# Patient Record
Sex: Female | Born: 1965
Health system: Southern US, Academic
[De-identification: ages and names within clinical notes are randomized; demographics above are authoritative.]

## PROBLEM LIST (undated history)

## (undated) ENCOUNTER — Ambulatory Visit: Payer: MEDICARE

## (undated) ENCOUNTER — Encounter

## (undated) ENCOUNTER — Telehealth

## (undated) ENCOUNTER — Encounter: Attending: Hematology & Oncology | Primary: Hematology & Oncology

## (undated) ENCOUNTER — Ambulatory Visit

## (undated) ENCOUNTER — Encounter
Attending: Student in an Organized Health Care Education/Training Program | Primary: Student in an Organized Health Care Education/Training Program

## (undated) ENCOUNTER — Telehealth
Attending: Student in an Organized Health Care Education/Training Program | Primary: Student in an Organized Health Care Education/Training Program

## (undated) ENCOUNTER — Ambulatory Visit: Payer: MEDICARE | Attending: Hematology & Oncology | Primary: Hematology & Oncology

## (undated) ENCOUNTER — Encounter: Attending: Pharmacist | Primary: Pharmacist

## (undated) ENCOUNTER — Telehealth: Payer: MEDICARE

## (undated) ENCOUNTER — Telehealth: Attending: Hematology & Oncology | Primary: Hematology & Oncology

## (undated) ENCOUNTER — Other Ambulatory Visit

## (undated) ENCOUNTER — Telehealth: Attending: Adult Health | Primary: Adult Health

## (undated) ENCOUNTER — Ambulatory Visit: Payer: Medicare (Managed Care)

## (undated) ENCOUNTER — Ambulatory Visit: Payer: MEDICARE | Attending: Adult Health | Primary: Adult Health

## (undated) ENCOUNTER — Telehealth: Attending: Pharmacist | Primary: Pharmacist

## (undated) ENCOUNTER — Ambulatory Visit
Payer: MEDICARE | Attending: Student in an Organized Health Care Education/Training Program | Primary: Student in an Organized Health Care Education/Training Program

## (undated) DIAGNOSIS — M810 Age-related osteoporosis without current pathological fracture: Secondary | ICD-10-CM

## (undated) DIAGNOSIS — K219 Gastro-esophageal reflux disease without esophagitis: Secondary | ICD-10-CM

## (undated) DIAGNOSIS — F419 Anxiety disorder, unspecified: Secondary | ICD-10-CM

## (undated) DIAGNOSIS — K5900 Constipation, unspecified: Secondary | ICD-10-CM

## (undated) DIAGNOSIS — M1990 Unspecified osteoarthritis, unspecified site: Secondary | ICD-10-CM

## (undated) DIAGNOSIS — F259 Schizoaffective disorder, unspecified: Secondary | ICD-10-CM

## (undated) DIAGNOSIS — J45909 Unspecified asthma, uncomplicated: Secondary | ICD-10-CM

## (undated) DIAGNOSIS — E669 Obesity, unspecified: Secondary | ICD-10-CM

## (undated) DIAGNOSIS — I10 Essential (primary) hypertension: Secondary | ICD-10-CM

## (undated) DIAGNOSIS — M109 Gout, unspecified: Secondary | ICD-10-CM

## (undated) DIAGNOSIS — R32 Unspecified urinary incontinence: Secondary | ICD-10-CM

## (undated) HISTORY — PX: ABDOMINAL HYSTERECTOMY: SHX81

## (undated) MED ORDER — METOPROLOL SUCCINATE ER 25 MG TABLET,EXTENDED RELEASE 24 HR: Freq: Every day | ORAL | 0 days

## (undated) MED ORDER — BIOTENE MOISTURIZING MOUTH MUCOSAL SPRAY: 0 days | PRN

## (undated) MED ORDER — ONDANSETRON HCL 4 MG TABLET: Freq: Three times a day (TID) | ORAL | 0.00000 days | Status: SS | PRN

## (undated) MED ORDER — BISACODYL 5 MG TABLET,DELAYED RELEASE: Freq: Every day | ORAL | 0 days | Status: SS | PRN

## (undated) MED ORDER — PRAVASTATIN 20 MG TABLET
Freq: Every day | ORAL | 0 days | Status: SS
Start: ? — End: 2020-07-24

## (undated) MED ORDER — METOPROLOL TARTRATE 25 MG TABLET: Freq: Two times a day (BID) | ORAL | 0.00000 days | Status: SS

## (undated) MED ORDER — METFORMIN ER 1,000 MG 24 HR TABLET,EXTENDED RELEASE: Freq: Every day | ORAL | 0 days

## (undated) MED ORDER — SERTRALINE 50 MG TABLET: Freq: Every day | ORAL | 0 days

## (undated) MED ORDER — QUETIAPINE 50 MG TABLET: Freq: Every evening | ORAL | 0 days

## (undated) MED ORDER — LIDOCAINE 5 % TOPICAL PATCH: TRANSDERMAL | 0 days

## (undated) MED ORDER — DIPHENOXYLATE-ATROPINE 2.5 MG-0.025 MG TABLET: Freq: Four times a day (QID) | ORAL | 0 days | PRN

## (undated) MED ORDER — VALPROIC ACID 250 MG CAPSULE: Freq: Three times a day (TID) | ORAL | 0 days

---
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
## 2000-08-19 ENCOUNTER — Inpatient Hospital Stay (HOSPITAL_COMMUNITY): Admission: EM | Admit: 2000-08-19 | Discharge: 2000-08-27 | Payer: Self-pay | Admitting: *Deleted

## 2000-10-02 ENCOUNTER — Inpatient Hospital Stay (HOSPITAL_COMMUNITY): Admission: EM | Admit: 2000-10-02 | Discharge: 2000-10-09 | Payer: Self-pay | Admitting: Psychiatry

## 2002-03-16 ENCOUNTER — Encounter: Payer: Self-pay | Admitting: Emergency Medicine

## 2002-03-16 ENCOUNTER — Emergency Department (HOSPITAL_COMMUNITY): Admission: EM | Admit: 2002-03-16 | Discharge: 2002-03-16 | Payer: Self-pay | Admitting: Emergency Medicine

## 2012-05-26 ENCOUNTER — Emergency Department: Payer: Self-pay | Admitting: Emergency Medicine

## 2012-05-26 LAB — CBC
HGB: 11.5 g/dL — ABNORMAL LOW (ref 12.0–16.0)
MCH: 28.7 pg (ref 26.0–34.0)
Platelet: 392 10*3/uL (ref 150–440)
RBC: 4.01 10*6/uL (ref 3.80–5.20)
WBC: 14 10*3/uL — ABNORMAL HIGH (ref 3.6–11.0)

## 2012-05-26 LAB — URINALYSIS, COMPLETE
Glucose,UR: NEGATIVE mg/dL (ref 0–75)
Ketone: NEGATIVE
Leukocyte Esterase: NEGATIVE
Nitrite: NEGATIVE
Ph: 5 (ref 4.5–8.0)
Protein: NEGATIVE
RBC,UR: 1 /HPF (ref 0–5)
Squamous Epithelial: 14
WBC UR: 5 /HPF (ref 0–5)

## 2012-05-26 LAB — COMPREHENSIVE METABOLIC PANEL
Albumin: 3.8 g/dL (ref 3.4–5.0)
Anion Gap: 4 — ABNORMAL LOW (ref 7–16)
Bilirubin,Total: 0.3 mg/dL (ref 0.2–1.0)
Chloride: 104 mmol/L (ref 98–107)
Co2: 29 mmol/L (ref 21–32)
Creatinine: 0.76 mg/dL (ref 0.60–1.30)
EGFR (African American): >60
EGFR (Non-African Amer.): >60
Glucose: 110 mg/dL — ABNORMAL HIGH (ref 65–99)
SGPT (ALT): 20 U/L (ref 12–78)
Sodium: 137 mmol/L (ref 136–145)
Total Protein: 8.2 g/dL (ref 6.4–8.2)

## 2012-05-26 LAB — DRUG SCREEN, URINE
Amphetamines, Ur Screen: NEGATIVE (ref ?–1000)
Barbiturates, Ur Screen: NEGATIVE (ref ?–200)
Benzodiazepine, Ur Scrn: NEGATIVE (ref ?–200)
Cannabinoid 50 Ng, Ur ~~LOC~~: NEGATIVE (ref ?–50)
Cocaine Metabolite,Ur ~~LOC~~: NEGATIVE (ref ?–300)
MDMA (Ecstasy)Ur Screen: NEGATIVE (ref ?–500)
Opiate, Ur Screen: NEGATIVE (ref ?–300)
Phencyclidine (PCP) Ur S: NEGATIVE (ref ?–25)

## 2012-05-26 LAB — ETHANOL: Ethanol %: <0.003 % (ref 0.000–0.080)

## 2012-05-26 LAB — TSH: Thyroid Stimulating Horm: 1.95 u[IU]/mL

## 2012-05-27 LAB — DIFFERENTIAL
Basophil #: 0.1 10*3/uL (ref 0.0–0.1)
Basophil %: 0.9 %
Eosinophil %: 1.7 %
Lymphocyte #: 4.6 10*3/uL — ABNORMAL HIGH (ref 1.0–3.6)
Lymphocyte %: 32.9 %
Monocyte #: 0.9 x10 3/mm (ref 0.2–0.9)
Monocyte %: 6.4 %
Neutrophil #: 8.1 10*3/uL — ABNORMAL HIGH (ref 1.4–6.5)
Neutrophil %: 58.1 %

## 2012-06-02 LAB — DIFFERENTIAL
Basophil #: 0.1 10*3/uL (ref 0.0–0.1)
Basophil %: 1 %
Eosinophil #: 0.3 10*3/uL (ref 0.0–0.7)
Lymphocyte #: 3.9 10*3/uL — ABNORMAL HIGH (ref 1.0–3.6)
Lymphocyte %: 37.6 %
Monocyte #: 0.9 x10 3/mm (ref 0.2–0.9)
Monocyte %: 8.9 %
Neutrophil %: 49.9 %

## 2012-06-02 LAB — WBC: WBC: 10.4 10*3/uL (ref 3.6–11.0)

## 2012-06-09 ENCOUNTER — Emergency Department: Payer: Self-pay | Admitting: Emergency Medicine

## 2012-06-09 LAB — ACETAMINOPHEN LEVEL: Acetaminophen: <2 ug/mL

## 2012-06-09 LAB — COMPREHENSIVE METABOLIC PANEL
Albumin: 3.8 g/dL (ref 3.4–5.0)
Alkaline Phosphatase: 85 U/L (ref 50–136)
Anion Gap: 7 (ref 7–16)
BUN: 12 mg/dL (ref 7–18)
Bilirubin,Total: 0.2 mg/dL (ref 0.2–1.0)
Calcium, Total: 9.3 mg/dL (ref 8.5–10.1)
Co2: 23 mmol/L (ref 21–32)
EGFR (African American): >60
EGFR (Non-African Amer.): >60
Glucose: 155 mg/dL — ABNORMAL HIGH (ref 65–99)
Osmolality: 279 (ref 275–301)
Potassium: 3.2 mmol/L — ABNORMAL LOW (ref 3.5–5.1)
Sodium: 138 mmol/L (ref 136–145)

## 2012-06-09 LAB — CBC
MCH: 28.4 pg (ref 26.0–34.0)
MCHC: 33.3 g/dL (ref 32.0–36.0)
MCV: 85 fL (ref 80–100)
Platelet: 361 10*3/uL (ref 150–440)
RDW: 14.7 % — ABNORMAL HIGH (ref 11.5–14.5)

## 2012-06-09 LAB — URINALYSIS, COMPLETE
Blood: NEGATIVE
Glucose,UR: NEGATIVE mg/dL (ref 0–75)
Nitrite: NEGATIVE
Ph: 5 (ref 4.5–8.0)
Protein: 30
Specific Gravity: 1.031 (ref 1.003–1.030)
Squamous Epithelial: 16
WBC UR: 3 /HPF (ref 0–5)

## 2012-06-09 LAB — DRUG SCREEN, URINE
Benzodiazepine, Ur Scrn: NEGATIVE (ref ?–200)
Opiate, Ur Screen: NEGATIVE (ref ?–300)
Tricyclic, Ur Screen: NEGATIVE (ref ?–1000)

## 2012-06-09 LAB — SALICYLATE LEVEL: Salicylates, Serum: <1.7 mg/dL

## 2012-06-09 LAB — ETHANOL: Ethanol: <3 mg/dL

## 2012-06-09 LAB — TSH: Thyroid Stimulating Horm: 1.85 u[IU]/mL

## 2012-06-10 LAB — CBC
HCT: 34.7 % — ABNORMAL LOW (ref 35.0–47.0)
HGB: 11.2 g/dL — ABNORMAL LOW (ref 12.0–16.0)
MCH: 27.7 pg (ref 26.0–34.0)
MCV: 86 fL (ref 80–100)
Platelet: 356 10*3/uL (ref 150–440)
RBC: 4.04 10*6/uL (ref 3.80–5.20)
RDW: 14.9 % — ABNORMAL HIGH (ref 11.5–14.5)
WBC: 13.1 10*3/uL — ABNORMAL HIGH (ref 3.6–11.0)

## 2012-06-10 LAB — DIFFERENTIAL
Eosinophil #: 0.1 10*3/uL (ref 0.0–0.7)
Eosinophil %: 1.9 %
Monocyte #: 0.9 x10 3/mm (ref 0.2–0.9)
Neutrophil #: 7.6 10*3/uL — ABNORMAL HIGH (ref 1.4–6.5)
Neutrophil %: 58 %

## 2012-06-26 ENCOUNTER — Emergency Department: Payer: Self-pay | Admitting: Unknown Physician Specialty

## 2012-06-26 LAB — COMPREHENSIVE METABOLIC PANEL
Anion Gap: 5 — ABNORMAL LOW (ref 7–16)
BUN: 12 mg/dL (ref 7–18)
Bilirubin,Total: 0.2 mg/dL (ref 0.2–1.0)
Calcium, Total: 9.1 mg/dL (ref 8.5–10.1)
Co2: 27 mmol/L (ref 21–32)
Creatinine: 0.82 mg/dL (ref 0.60–1.30)
EGFR (African American): >60
EGFR (Non-African Amer.): >60
Glucose: 93 mg/dL (ref 65–99)
Potassium: 3.6 mmol/L (ref 3.5–5.1)
SGOT(AST): 14 U/L — ABNORMAL LOW (ref 15–37)
Sodium: 140 mmol/L (ref 136–145)
Total Protein: 8.2 g/dL (ref 6.4–8.2)

## 2012-06-26 LAB — CBC
HGB: 11.2 g/dL — ABNORMAL LOW (ref 12.0–16.0)
MCH: 28.7 pg (ref 26.0–34.0)
MCHC: 33.3 g/dL (ref 32.0–36.0)
RBC: 3.92 10*6/uL (ref 3.80–5.20)
RDW: 15 % — ABNORMAL HIGH (ref 11.5–14.5)

## 2012-06-26 LAB — TSH: Thyroid Stimulating Horm: 1.98 u[IU]/mL

## 2012-06-26 LAB — ETHANOL: Ethanol %: <0.003 % (ref 0.000–0.080)

## 2012-06-26 LAB — ACETAMINOPHEN LEVEL: Acetaminophen: <2 ug/mL

## 2012-06-27 LAB — URINALYSIS, COMPLETE
Bilirubin,UR: NEGATIVE
Glucose,UR: NEGATIVE mg/dL (ref 0–75)
Ketone: NEGATIVE
Leukocyte Esterase: NEGATIVE
Nitrite: NEGATIVE
Ph: 6 (ref 4.5–8.0)
Protein: NEGATIVE
Specific Gravity: 1.012 (ref 1.003–1.030)
Squamous Epithelial: 10
WBC UR: 2 /HPF (ref 0–5)

## 2012-06-27 LAB — DIFFERENTIAL
Basophil #: 0.1 10*3/uL (ref 0.0–0.1)
Basophil %: 0.9 %
Eosinophil #: 0.2 10*3/uL (ref 0.0–0.7)
Eosinophil %: 1.5 %
Lymphocyte #: 5.3 10*3/uL — ABNORMAL HIGH (ref 1.0–3.6)
Lymphocyte %: 38.9 %
Monocyte #: 0.9 x10 3/mm (ref 0.2–0.9)
Monocyte %: 6.3 %
Neutrophil #: 7.1 10*3/uL — ABNORMAL HIGH (ref 1.4–6.5)
Neutrophil %: 52.4 %

## 2012-06-27 LAB — DRUG SCREEN, URINE
Amphetamines, Ur Screen: NEGATIVE (ref ?–1000)
Barbiturates, Ur Screen: NEGATIVE (ref ?–200)
Benzodiazepine, Ur Scrn: NEGATIVE (ref ?–200)
Cocaine Metabolite,Ur ~~LOC~~: NEGATIVE (ref ?–300)
MDMA (Ecstasy)Ur Screen: NEGATIVE (ref ?–500)

## 2013-01-18 ENCOUNTER — Emergency Department: Payer: Self-pay | Admitting: Emergency Medicine

## 2013-01-18 LAB — CBC
HCT: 34.4 % — ABNORMAL LOW (ref 35.0–47.0)
HGB: 11.5 g/dL — AB (ref 12.0–16.0)
MCH: 29.5 pg (ref 26.0–34.0)
MCHC: 33.5 g/dL (ref 32.0–36.0)
MCV: 88 fL (ref 80–100)
Platelet: 349 10*3/uL (ref 150–440)
RBC: 3.92 10*6/uL (ref 3.80–5.20)
RDW: 13.9 % (ref 11.5–14.5)
WBC: 14.5 10*3/uL — AB (ref 3.6–11.0)

## 2013-01-18 LAB — BASIC METABOLIC PANEL
Anion Gap: 5 — ABNORMAL LOW (ref 7–16)
BUN: 11 mg/dL (ref 7–18)
Calcium, Total: 9.3 mg/dL (ref 8.5–10.1)
Chloride: 106 mmol/L (ref 98–107)
Co2: 28 mmol/L (ref 21–32)
Creatinine: 0.72 mg/dL (ref 0.60–1.30)
GLUCOSE: 105 mg/dL — AB (ref 65–99)
Osmolality: 277 (ref 275–301)
POTASSIUM: 3.8 mmol/L (ref 3.5–5.1)
Sodium: 139 mmol/L (ref 136–145)

## 2013-01-19 LAB — URINALYSIS, COMPLETE
BILIRUBIN, UR: NEGATIVE
Blood: NEGATIVE
Glucose,UR: NEGATIVE mg/dL (ref 0–75)
KETONE: NEGATIVE
Leukocyte Esterase: NEGATIVE
Nitrite: NEGATIVE
PH: 6 (ref 4.5–8.0)
Protein: NEGATIVE
Specific Gravity: 1.049 (ref 1.003–1.030)
Squamous Epithelial: 3
WBC UR: <1 /HPF (ref 0–5)

## 2013-01-26 ENCOUNTER — Emergency Department: Payer: Self-pay | Admitting: Emergency Medicine

## 2013-01-26 LAB — DRUG SCREEN, URINE
AMPHETAMINES, UR SCREEN: NEGATIVE (ref ?–1000)
BENZODIAZEPINE, UR SCRN: NEGATIVE (ref ?–200)
Barbiturates, Ur Screen: NEGATIVE (ref ?–200)
Cannabinoid 50 Ng, Ur ~~LOC~~: NEGATIVE (ref ?–50)
Cocaine Metabolite,Ur ~~LOC~~: NEGATIVE (ref ?–300)
MDMA (ECSTASY) UR SCREEN: NEGATIVE (ref ?–500)
METHADONE, UR SCREEN: NEGATIVE (ref ?–300)
Opiate, Ur Screen: NEGATIVE (ref ?–300)
Phencyclidine (PCP) Ur S: NEGATIVE (ref ?–25)
Tricyclic, Ur Screen: NEGATIVE (ref ?–1000)

## 2013-01-26 LAB — COMPREHENSIVE METABOLIC PANEL
AST: 20 U/L (ref 15–37)
Albumin: 4.1 g/dL (ref 3.4–5.0)
Alkaline Phosphatase: 86 U/L
Anion Gap: 5 — ABNORMAL LOW (ref 7–16)
BUN: 9 mg/dL (ref 7–18)
Bilirubin,Total: 0.2 mg/dL (ref 0.2–1.0)
CO2: 25 mmol/L (ref 21–32)
Calcium, Total: 9.2 mg/dL (ref 8.5–10.1)
Chloride: 105 mmol/L (ref 98–107)
Creatinine: 0.6 mg/dL (ref 0.60–1.30)
EGFR (African American): >60
EGFR (Non-African Amer.): >60
Glucose: 126 mg/dL — ABNORMAL HIGH (ref 65–99)
OSMOLALITY: 270 (ref 275–301)
Potassium: 3.4 mmol/L — ABNORMAL LOW (ref 3.5–5.1)
SGPT (ALT): 18 U/L (ref 12–78)
SODIUM: 135 mmol/L — AB (ref 136–145)
TOTAL PROTEIN: 8.4 g/dL — AB (ref 6.4–8.2)

## 2013-01-26 LAB — URINALYSIS, COMPLETE
BLOOD: NEGATIVE
Bilirubin,UR: NEGATIVE
Glucose,UR: NEGATIVE mg/dL (ref 0–75)
Leukocyte Esterase: NEGATIVE
Nitrite: NEGATIVE
Ph: 5 (ref 4.5–8.0)
Protein: NEGATIVE
RBC,UR: 2 /HPF (ref 0–5)
Specific Gravity: 1.027 (ref 1.003–1.030)
Squamous Epithelial: 32
WBC UR: 2 /HPF (ref 0–5)

## 2013-01-26 LAB — DIFFERENTIAL
BASOS ABS: 0.1 10*3/uL (ref 0.0–0.1)
Basophil %: 0.7 %
Eosinophil #: 0.3 10*3/uL (ref 0.0–0.7)
Eosinophil %: 3 %
Lymphocyte #: 3.9 10*3/uL — ABNORMAL HIGH (ref 1.0–3.6)
Lymphocyte %: 37.3 %
MONO ABS: 0.6 x10 3/mm (ref 0.2–0.9)
Monocyte %: 5.8 %
NEUTROS ABS: 5.5 10*3/uL (ref 1.4–6.5)
NEUTROS PCT: 53.2 %

## 2013-01-26 LAB — CBC
HCT: 35.7 % (ref 35.0–47.0)
HGB: 12 g/dL (ref 12.0–16.0)
MCH: 30 pg (ref 26.0–34.0)
MCHC: 33.6 g/dL (ref 32.0–36.0)
MCV: 89 fL (ref 80–100)
Platelet: 350 10*3/uL (ref 150–440)
RBC: 3.99 10*6/uL (ref 3.80–5.20)
RDW: 13.8 % (ref 11.5–14.5)
WBC: 10.4 10*3/uL (ref 3.6–11.0)

## 2013-01-26 LAB — ETHANOL: Ethanol %: <0.003 % (ref 0.000–0.080)

## 2013-01-26 LAB — SALICYLATE LEVEL: Salicylates, Serum: <1.7 mg/dL

## 2013-01-26 LAB — ACETAMINOPHEN LEVEL: Acetaminophen: <2 ug/mL

## 2013-06-03 ENCOUNTER — Emergency Department: Payer: Self-pay | Admitting: Emergency Medicine

## 2013-06-04 ENCOUNTER — Emergency Department: Payer: Self-pay | Admitting: Emergency Medicine

## 2013-06-15 ENCOUNTER — Emergency Department: Payer: Self-pay | Admitting: Emergency Medicine

## 2013-06-15 LAB — COMPREHENSIVE METABOLIC PANEL
ALBUMIN: 3.9 g/dL (ref 3.4–5.0)
Alkaline Phosphatase: 81 U/L
Anion Gap: 6 — ABNORMAL LOW (ref 7–16)
BUN: 6 mg/dL — ABNORMAL LOW (ref 7–18)
Bilirubin,Total: 0.2 mg/dL (ref 0.2–1.0)
Calcium, Total: 9.4 mg/dL (ref 8.5–10.1)
Chloride: 103 mmol/L (ref 98–107)
Co2: 26 mmol/L (ref 21–32)
Creatinine: 0.64 mg/dL (ref 0.60–1.30)
EGFR (African American): >60
GLUCOSE: 94 mg/dL (ref 65–99)
Osmolality: 267 (ref 275–301)
POTASSIUM: 3.5 mmol/L (ref 3.5–5.1)
SGOT(AST): 13 U/L — ABNORMAL LOW (ref 15–37)
SGPT (ALT): 15 U/L (ref 12–78)
Sodium: 135 mmol/L — ABNORMAL LOW (ref 136–145)
Total Protein: 8.4 g/dL — ABNORMAL HIGH (ref 6.4–8.2)

## 2013-06-15 LAB — URINALYSIS, COMPLETE
BILIRUBIN, UR: NEGATIVE
Blood: NEGATIVE
GLUCOSE, UR: NEGATIVE mg/dL (ref 0–75)
KETONE: NEGATIVE
Leukocyte Esterase: NEGATIVE
Nitrite: NEGATIVE
Ph: 5 (ref 4.5–8.0)
Protein: NEGATIVE
Specific Gravity: 1.014 (ref 1.003–1.030)

## 2013-06-15 LAB — ETHANOL: Ethanol %: <0.003 % (ref 0.000–0.080)

## 2013-06-15 LAB — CBC
HCT: 36.1 % (ref 35.0–47.0)
HGB: 12 g/dL (ref 12.0–16.0)
MCH: 29.1 pg (ref 26.0–34.0)
MCHC: 33.2 g/dL (ref 32.0–36.0)
MCV: 88 fL (ref 80–100)
PLATELETS: 411 10*3/uL (ref 150–440)
RBC: 4.12 10*6/uL (ref 3.80–5.20)
RDW: 14 % (ref 11.5–14.5)
WBC: 15 10*3/uL — ABNORMAL HIGH (ref 3.6–11.0)

## 2013-06-15 LAB — DRUG SCREEN, URINE
AMPHETAMINES, UR SCREEN: NEGATIVE (ref ?–1000)
Barbiturates, Ur Screen: NEGATIVE (ref ?–200)
Benzodiazepine, Ur Scrn: NEGATIVE (ref ?–200)
Cannabinoid 50 Ng, Ur ~~LOC~~: NEGATIVE (ref ?–50)
Cocaine Metabolite,Ur ~~LOC~~: NEGATIVE (ref ?–300)
MDMA (ECSTASY) UR SCREEN: NEGATIVE (ref ?–500)
METHADONE, UR SCREEN: NEGATIVE (ref ?–300)
Opiate, Ur Screen: NEGATIVE (ref ?–300)
PHENCYCLIDINE (PCP) UR S: NEGATIVE (ref ?–25)
TRICYCLIC, UR SCREEN: NEGATIVE (ref ?–1000)

## 2013-06-15 LAB — SALICYLATE LEVEL: SALICYLATES, SERUM: 2.8 mg/dL

## 2013-06-15 LAB — ACETAMINOPHEN LEVEL: Acetaminophen: <2 ug/mL

## 2013-10-17 ENCOUNTER — Emergency Department: Payer: Self-pay | Admitting: Emergency Medicine

## 2014-04-23 ENCOUNTER — Emergency Department
Admit: 2014-04-23 | Disposition: A | Discharge status: Home / Self Care | Payer: Self-pay | Admitting: Emergency Medicine

## 2014-04-23 LAB — COMPREHENSIVE METABOLIC PANEL
ALK PHOS: 76 U/L
ALT: 14 U/L
AST: 20 U/L
Albumin: 4.2 g/dL
Anion Gap: 3 — ABNORMAL LOW (ref 7–16)
BUN: 7 mg/dL
Bilirubin,Total: 0.3 mg/dL
CALCIUM: 9 mg/dL
CO2: 25 mmol/L
CREATININE: 0.54 mg/dL
Chloride: 109 mmol/L
EGFR (African American): >60
EGFR (Non-African Amer.): >60
GLUCOSE: 117 mg/dL — AB
POTASSIUM: 3.2 mmol/L — AB
Sodium: 137 mmol/L
Total Protein: 8 g/dL

## 2014-04-23 LAB — CBC
HCT: 35.4 % (ref 35.0–47.0)
HGB: 11.8 g/dL — AB (ref 12.0–16.0)
MCH: 29 pg (ref 26.0–34.0)
MCHC: 33.3 g/dL (ref 32.0–36.0)
MCV: 87 fL (ref 80–100)
Platelet: 350 10*3/uL (ref 150–440)
RBC: 4.06 10*6/uL (ref 3.80–5.20)
RDW: 13.7 % (ref 11.5–14.5)
WBC: 10.2 10*3/uL (ref 3.6–11.0)

## 2014-04-23 LAB — URINALYSIS, COMPLETE
Bacteria: NONE SEEN
Bilirubin,UR: NEGATIVE
Blood: NEGATIVE
Glucose,UR: NEGATIVE mg/dL (ref 0–75)
Ketone: NEGATIVE
Leukocyte Esterase: NEGATIVE
NITRITE: NEGATIVE
Ph: 5 (ref 4.5–8.0)
Protein: NEGATIVE
Specific Gravity: 1.012 (ref 1.003–1.030)

## 2014-04-23 LAB — SALICYLATE LEVEL: Salicylates, Serum: <4 mg/dL

## 2014-04-23 LAB — DRUG SCREEN, URINE
Amphetamines, Ur Screen: NEGATIVE
BARBITURATES, UR SCREEN: NEGATIVE
Benzodiazepine, Ur Scrn: NEGATIVE
COCAINE METABOLITE, UR ~~LOC~~: NEGATIVE
Cannabinoid 50 Ng, Ur ~~LOC~~: NEGATIVE
MDMA (Ecstasy)Ur Screen: NEGATIVE
Methadone, Ur Screen: NEGATIVE
Opiate, Ur Screen: NEGATIVE
Phencyclidine (PCP) Ur S: NEGATIVE
TRICYCLIC, UR SCREEN: NEGATIVE

## 2014-04-23 LAB — ACETAMINOPHEN LEVEL

## 2014-04-23 LAB — ETHANOL

## 2014-04-24 NOTE — Consult Note (Signed)
Brief Consult Note: Diagnosis: Schizophrenia, Ch PT.   Patient was seen by consultant.   Recommend further assessment or treatment.   Comments: Pt seen for follow up. She remains agitated and stated that she wants to be dicharged as she does not belong here and she can go the homeless shelter as she does not have a place to go currently. She has a new guardian and she stated that her old guardian has already left her. She stated that she is taking her medications and she is not showing any behavioral problems. She remains agiated throughout the interview.  She is on Clozaril at this time.  Denied SI/HI or plans.   Plan; Will add Cogentin 1mg  po bid as her speech appeared somewhat slurred during the interview.  She will continue on Clozaril as prescribed.  Awaiting placement.  Electronic Signatures: Rhunette CroftFaheem, France Lusty S (MD)  (Signed 29-May-14 10:12)  Authored: Brief Consult Note   Last Updated: 29-May-14 10:12 by Rhunette CroftFaheem, Legaci Tarman S (MD)

## 2014-04-24 NOTE — Consult Note (Signed)
Brief Consult Note: Diagnosis: Schizophrenia, Ch PT.   Patient was seen by consultant.   Recommend further assessment or treatment.   Comments: Pt seen for follow up. She remains at her baseline and no new acute issues noted. She is currently on Clozaril for her Schizophrenia and has been responding to the medications.  Denied SI/HI or plans.   Plan; Will continue to montior.  No change in meds.  Electronic Signatures: Rhunette CroftFaheem, Leonides Minder S (MD)  (Signed 27-May-14 13:24)  Authored: Brief Consult Note   Last Updated: 27-May-14 13:24 by Rhunette CroftFaheem, Arlenis Blaydes S (MD)
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
## 2014-04-24 NOTE — Consult Note (Signed)
PATIENT NAME:  Amy Villarreal, Amy Villarreal MR#:  [id number] DATE OF BIRTH:  [birth year]  DATE OF CONSULTATION:  05/27/2012  REFERRING PHYSICIAN:   CONSULTING PHYSICIAN:  Quintavious Rinck K. Jahaad Penado, MD  AGE: 49   SEX:  Female   RACE:  African American   SUBJECTIVE:  This is followup of patient's discharge planning.  Intake reports that a guardian and group home are having conflicts of interest because the patient had behavior that was dangerous to others in the past.  Until the conflicts are resolved, the patient will not be discharged and IBC will be continued for now, to be re-evaluated again tomorrow for appropriate discharge planning.   ____________________________ Jannet MantisSurya K. Guss Bundehalla, MD skc:cc D: 05/27/2012 17:11:11 ET T: 05/27/2012 17:40:13 ET JOB#: [id number]  cc: Monika SalkSurya K. Guss Bundehalla, MD, <Dictator> Beau FannySURYA K Roselynne Lortz MD ELECTRONICALLY SIGNED 06/01/2012 15:31
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
## 2014-04-25 NOTE — Consult Note (Signed)
PATIENT NAME:  Amy Villarreal, Amy Villarreal MR#:  [id number] DATE OF BIRTH:  12-23-1965  DATE OF ADMISSION:  01/26/2013 DATE OF CONSULTATION:  01/27/2013  REFERRING PHYSICIAN:  Lowella FairyJohn Woodruff, MD  CONSULTING PHYSICIAN:  Gwenyth Dingee B. Amalya Salmons, MD   REASON FOR CONSULTATION: To evaluate aggressive patient.   IDENTIFYING DATA: [name] is a 49 year old female with history of schizophrenia.   CHIEF COMPLAINT: "I'm fine now."   HISTORY OF PRESENT ILLNESS: [name] is a resident at the group home. She has a conflict with her roommate. The roommate has been there the longest, and she feels that she can set the rules. The patient does not get along with her very well. She reportedly had an argument with her roommate and then broke a little table. She denies hurting people or massive property destruction. She has money, and she feels that she could pay for damages. She is cool and collected now and would very much like to return to her group home, and indeed, the group home is going to accept her back, but they are unwilling to move the patient to another room. The patient is okay with this. She has been compliant with medication and has been using all of her coping skills to make this placement work. The patient has been a resident of multiple group homes and has a history of violent and threatening behavior. She also oftentimes throws tantrums. She was transferred to this group home several months ago following [facility].  The patient, at the moment, denies any symptoms of depression, anxiety, psychosis. No symptoms suggestive of bipolar ammonia. No substance use. She assures me multiple times that she is cool and collected now. She will be able to deal with the situation and wants very much to return to the group home.   PAST PSYCHIATRIC HISTORY: She was diagnosed with schizophrenia. She has multiple psychiatric hospitalizations; her last one in June 2014 [facility] for  agitation that could not be controlled in our Emergency Room.   FAMILY PSYCHIATRIC HISTORY: Unknown.   PAST MEDICAL HISTORY: Osteoarthritis, obesity, vitamin D deficiency, diabetes, gout, GERD.   ALLERGIES: Unknown.   MEDICATIONS:  On admission, aspirin 81 mg daily, Mobic 7.5 mg daily, lisinopril 10 mg daily, Ativan 0.5 mg twice daily, Benadryl 50 mg every 4 hours as needed, allopurinol 100 mg twice daily, Clozaril 100 mg in the morning, 200 at night; Risperdal 3 mg at bedtime, senna daily, Prilosec 20 mg daily, vitamin D3 at 5000 units daily.   SOCIAL HISTORY: The patient is a group home resident. She is on disability and has health insurance. She has a history of multiple hospitalizations and multiple group home placements. It is unclear whether or not she is compliant with treatment.   REVIEW OF SYSTEMS: CONSTITUTIONAL: No fevers or chills. No weight changes.  EYES: No double or blurred vision.  ENT: No hearing loss.  RESPIRATORY: No shortness of breath or cough.  CARDIOVASCULAR: No chest pain or orthopnea.  GASTROINTESTINAL: No abdominal pain, nausea, vomiting or diarrhea.  GENITOURINARY: No incontinence or frequency.  ENDOCRINE: No heat or cold intolerance.  LYMPHATIC: No anemia or easy bruising.  INTEGUMENTARY: No acne or rash.  MUSCULOSKELETAL: No muscle or joint pain.  NEUROLOGIC: No tingling or weakness.  PSYCHIATRIC: See history of present illness for details.    PHYSICAL EXAMINATION: VITAL SIGNS: Blood pressure 139/74, pulse 88, respirations 18, temperature 98.1.  GENERAL: This is a morbidly obese female in no acute distress. The rest  of the physical examination is deferred to her primary attending.   LABORATORY DATA: Chemistries are within normal limits except for glucose of 126, sodium 135 and potassium 3.4. Blood alcohol level is 0. LFTs within normal limits. Urine tox screen negative for substances. CBC within normal limits. Urinalysis is not suggestive of urinary tract  infection. Serum acetaminophen and salicylates are low.   MENTAL STATUS EXAMINATION: The patient is alert and oriented to person, place, time and situation. She is pleasant, polite and cooperative. She is well groomed. Her speech is slow. She mumbles and is difficult to understand. Her timbre of voice is deep. Her mood is fine with flat affect. Thought process is logical. Thought content: She denies thoughts of hurting herself or others. There are no delusions or paranoia. There are no auditory or visual hallucinations. Her cognition is probably intact but difficult to assess formally. She seems of normal intelligence and average fund of knowledge. Her insight and judgment are poor.   DIAGNOSES: AXIS I: Schizophrenia.  AXIS II: Deferred.  AXIS III: Diabetes, osteoarthritis, obesity, vitamin D deficiency, gout, GERD.  AXIS IV: Mental illness, conflict at the group home, poor coping skills.  AXIS V: Global assessment of functioning is 45.   PLAN:  1.  The patient does not meet criteria for involuntary inpatient psychiatric commitment. Please discharge as appropriate.  2.  She is to continue all her medications without changes. No prescriptions necessary.  3.  She will follow up with [facility] as usual.  4.  Group home. Staff or ACT team will pick her up.    ____________________________ Ellin Goodie. Jennet Maduro, MD jbp:dmm D: 01/27/2013 19:25:14 ET T: 01/27/2013 20:20:15 ET JOB#: [id number]  cc: Arcola Freshour B. Jennet Maduro, MD, <Dictator> Shari Prows MD ELECTRONICALLY SIGNED 02/23/2013 6:42

## 2014-05-03 NOTE — Consult Note (Addendum)
PATIENT NAME:  Amy Villarreal, Amy Villarreal MR#:  [id number] DATE OF BIRTH:  Apr 24, 1965  DATE OF CONSULTATION:  04/23/2014  REFERRING PHYSICIAN:   CONSULTING PHYSICIAN:  Audery AmelJohn T. Clapacs, MD  IDENTIFYING INFORMATION AND REASON FOR CONSULT: This is a 49 year old woman with a history of schizophrenia, comes in the hospital with a chief complaint "I just needed to get away from there."   HISTORY OF PRESENT ILLNESS: The patient is not a very good historian and we do not have much collateral information. Commitment paperwork came with her. The patient states that she was having thoughts about being "suicidal." When I asked her to explain what she means by suicidal however it clearly has absolutely nothing to do with wanting to hurt herself or wanting to die. Instead sounds like she was getting angry and having thoughts about breaking something. Based on her past experience that when you break objects you end up having to pay for them she decided that instead she would call 911 and have herself brought into the Emergency Room. She will not explain to me why she was so angry. Every time I ask she just smiles and shakes her head and says that she just could not be around those people anymore. She says she has been taking all of her medicine as prescribed. Will not tell me about any new stresses or problems that she may have encountered. Denies having any hallucinations. Denies any substance abuse.   PAST PSYCHIATRIC HISTORY: The patient has a history of schizophrenia and has a history of behavior problems including smashing up of property, sometimes rather expensive property, when she loses her temper. She does get physically aggressive at times, although she is starting to learn to control that. Does not seem to have made any serious attempts to kill herself. She is followed by an ACT team and is on clozapine.   SOCIAL HISTORY: She tells me that she is still in the same group home that she has been in for over a year. She  will not divulge to me any specifics about what the problem might be there.   PAST MEDICAL HISTORY: Overweight.  High blood pressure. Gastric reflux symptoms, constipation, some chronic pain.   CURRENT MEDICATIONS: Allopurinol 100 mg twice a day, aspirin 81 mg once a day, clozapine 100 mg in the morning and 200 mg at night, docusate 100 mg twice a day, glipizide 10 mg with breakfast, lisinopril 10 mg a day, Ativan 0.5 mg b.i.d., Mobic 7.5 mg twice a day, pantoprazole 40 mg in the morning, Risperdal 3 mg at night, senna 1 tablet daily.   ALLERGIES: No known drug allergies.   SUBSTANCE ABUSE HISTORY: Denies that she has been drinking or using any drugs. Does not seem to be clearly having any past substance abuse history.   REVIEW OF SYSTEMS: The patient does not have any physical complaints. Denies any pain, GI, cardiac, or pulmonary symptoms. Denies any hallucinations.   MENTAL STATUS EXAMINATION: Adequately groomed woman, looks her stated age, cooperative with the interview. Eye contact good. Psychomotor activity a little fidgety. Speech is decreased in total amount. Somewhat telegraphic. Thoughts are disorganized. She will not elaborate on many things and has a hard time explaining what she is thinking. Did not make any obviously bizarre statements. Denied any hallucinations. Describes herself as being "suicidal" but absolutely denies that she wants to harm herself. Instead she reports that she was thinking of breaking something. The patient is able to repeat 3 words immediately, remembers 1  of them at 3 minutes. She is alert and oriented x 4. Judgment and insight chronically impaired.   LABORATORY RESULTS: Pregnancy test negative. Salicylates, acetaminophen, and alcohol negative. Chemistry panel, elevated glucose 117. CBC unremarkable. Urinalysis unremarkable. Drug screen negative.   VITAL SIGNS: Blood pressure 146/94, respirations 16, pulse 98, temperature 98.   ASSESSMENT: A 49 year old woman  with a history of schizophrenia, seems to have had herself brought in here because she was upset at something at the group home. Will not tell me what it is. Seems to have something to do with her money as usual. Seems to be probably at about her baseline in terms of her mental state. Not necessarily acutely psychotic, nor depressed. No evidence of acute suicidality. The patient is having an acting out behavior to avoid being at her group home.   TREATMENT PLAN: Continue all medication as previously prescribed. Once we get a little collateral information we can call her group home and work on sending her back.   DIAGNOSIS PRINCIPAL AND PRIMARY:   AXIS I: Schizophrenia.   SECONDARY DIAGNOSES:   AXIS I: No further.   AXIS III:  1. Diabetes.  2. Constipation.  3. High blood pressure.  4. Chronic pain.     ____________________________ Audery Amel, MD jtc:bu D: 04/23/2014 16:53:15 ET T: 04/23/2014 16:59:52 ET JOB#: [id number]  cc: Audery Amel, MD, <Dictator> Audery Amel MD ELECTRONICALLY SIGNED 05/08/2014 19:29

## 2014-05-26 IMAGING — CT CT ABD-PELV W/ CM
2 of 5 series · 16 of 46 positions shown, 18 images · IV contrast (isovue)
Comparison: None.

CLINICAL DATA: New onset of pelvic pressure.

EXAM:
CT ABDOMEN AND PELVIS WITH CONTRAST
TECHNIQUE: Multidetector CT imaging of the abdomen and pelvis was performed
using the standard protocol following bolus administration of
intravenous contrast.
CONTRAST:  125 mL of Isovue 370 IV contrast

[Series 4: routine abd pel with 2 · axial · 0.66mm/px · z∈[-480,-54]mm · 13 of 97 slices shown, 15 images]
[im 6/97  soft-tissue]
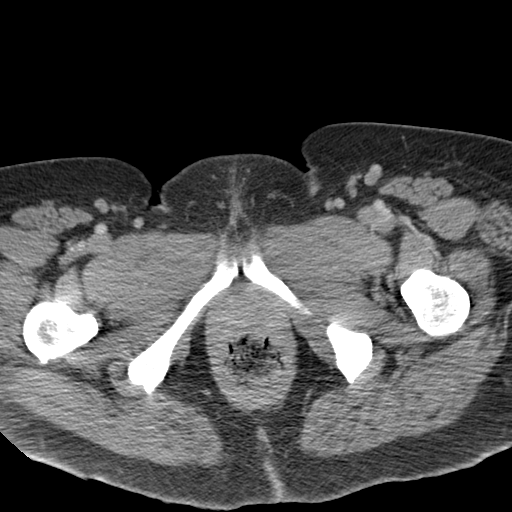
[im 6/97  bone]
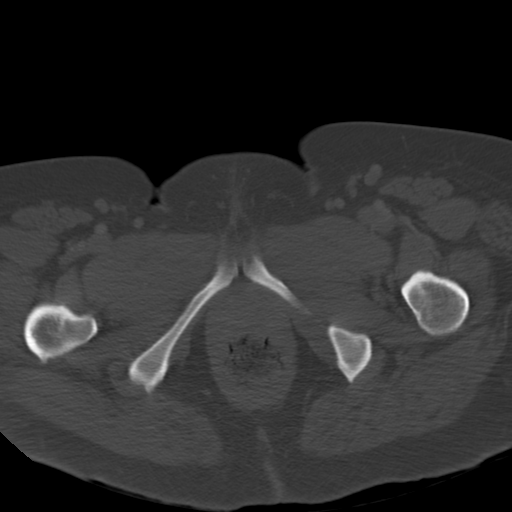
[im 16/97  soft-tissue]
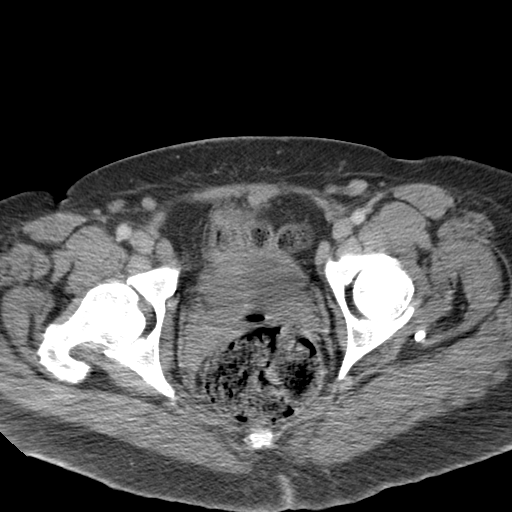
[im 21/97  soft-tissue]
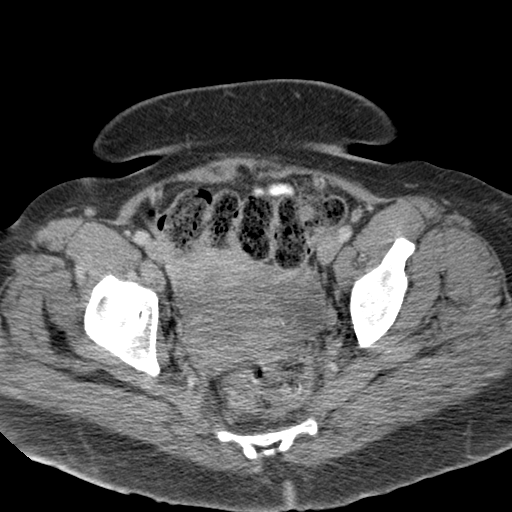
[im 26/97  soft-tissue]
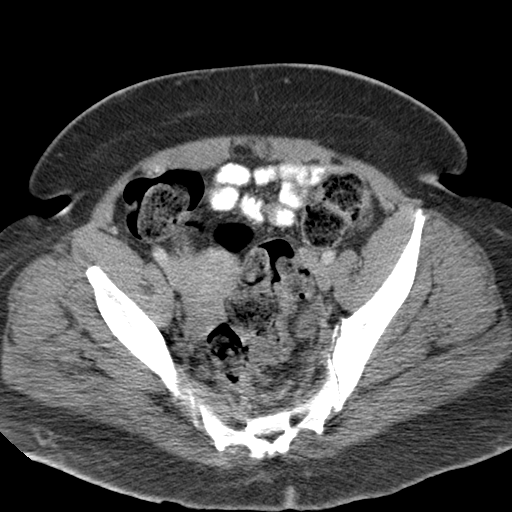
[im 36/97  soft-tissue]
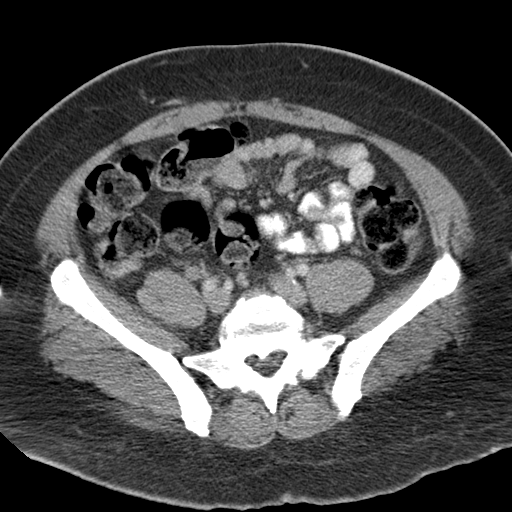
[im 41/97  soft-tissue]
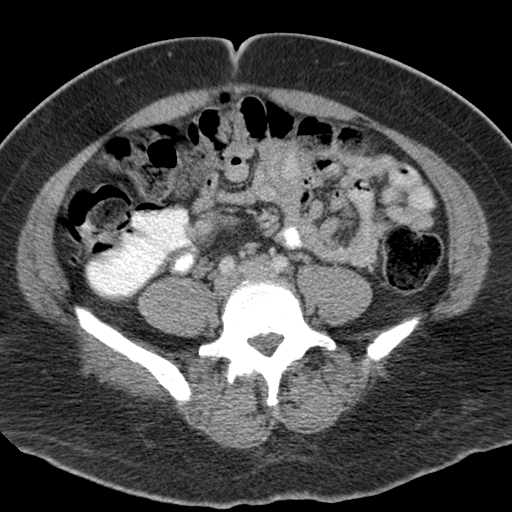
[im 51/97  soft-tissue]
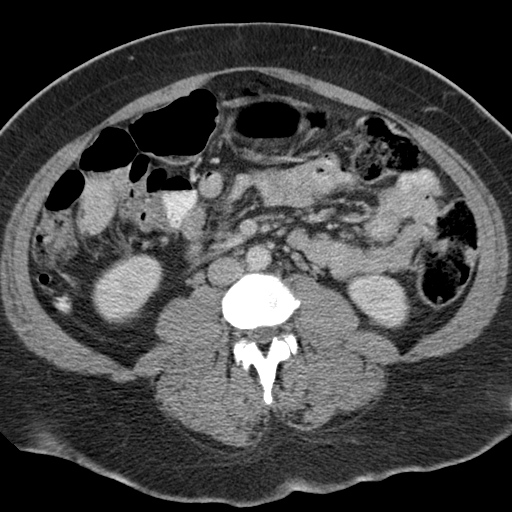
[im 56/97  soft-tissue]
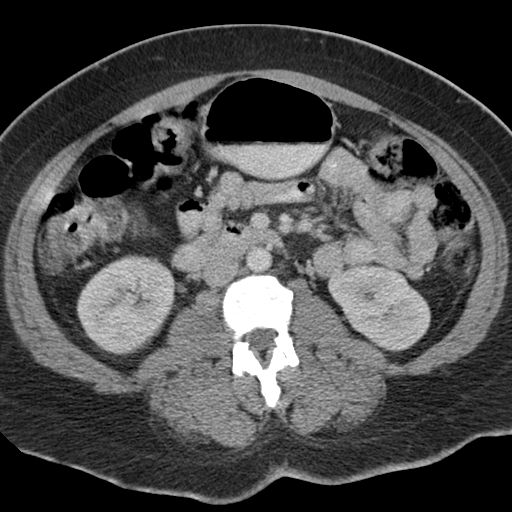
[im 61/97  soft-tissue]
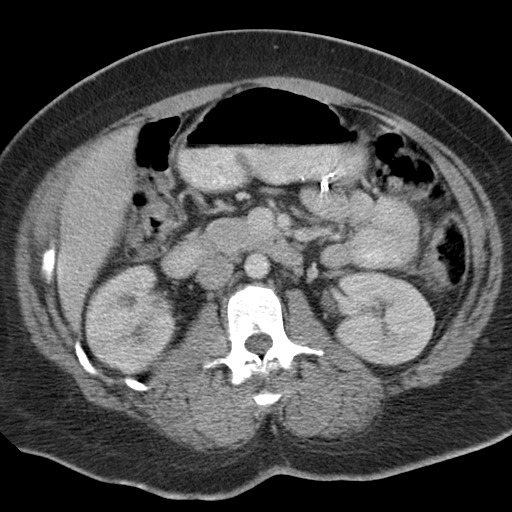
[im 61/97  bone]
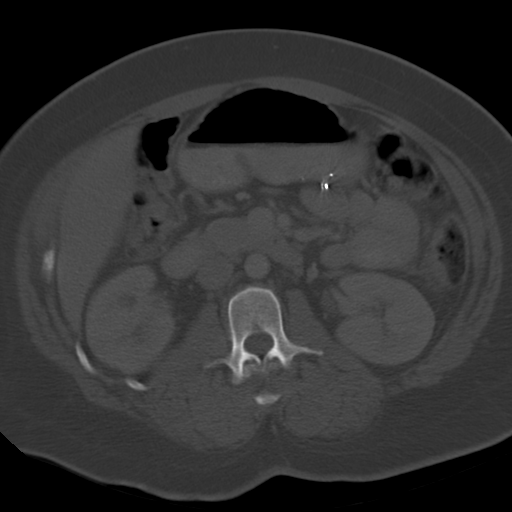
[im 71/97  soft-tissue]
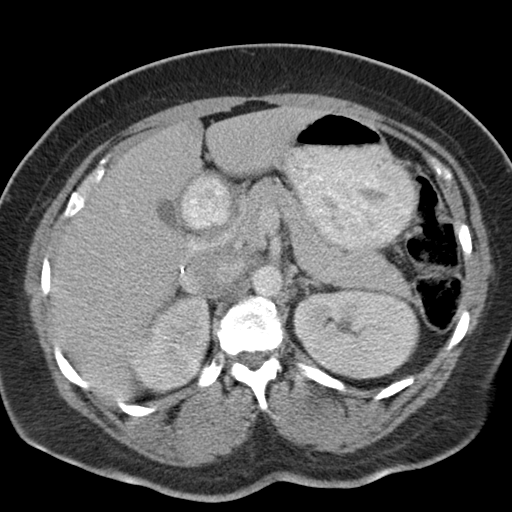
[im 76/97  soft-tissue]
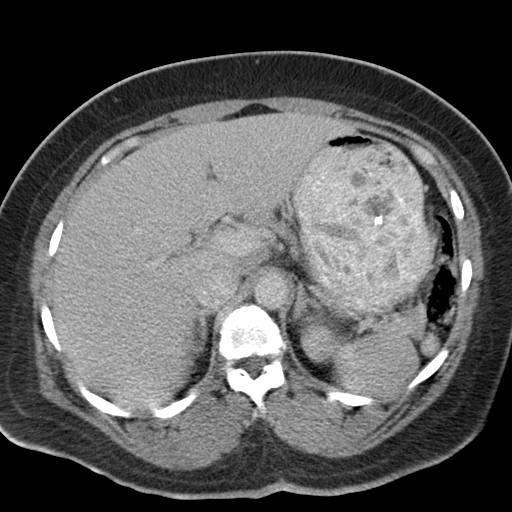
[im 81/97  soft-tissue]
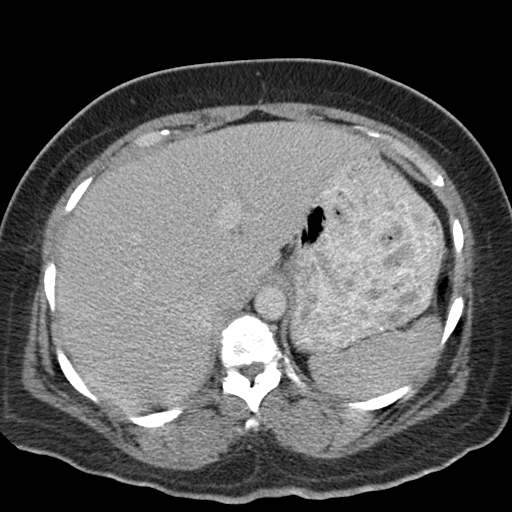
[im 91/97  soft-tissue]
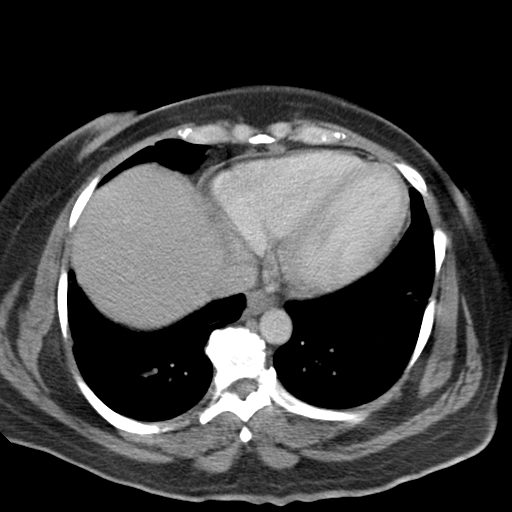

[Series 6: cor routine abd pel with · coronal · 0.94mm/px · 3 of 149 slices shown]
[im 50/149  soft-tissue]
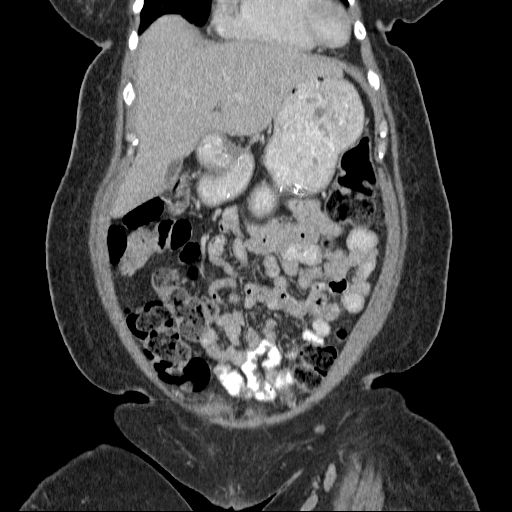
[im 66/149  soft-tissue]
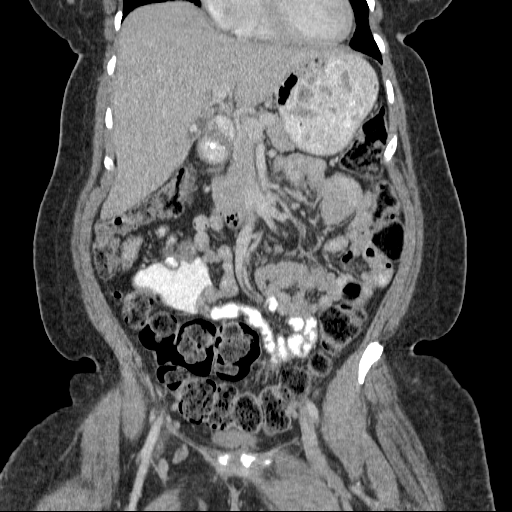
[im 83/149  soft-tissue]
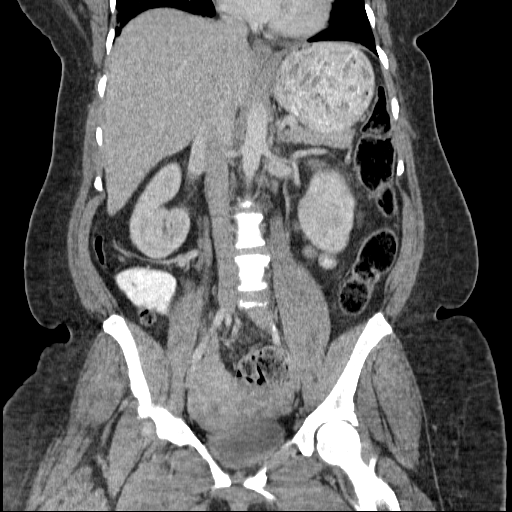

[16 of 46 positions shown; findings below may reference images not displayed]

FINDINGS: Vague patchy opacities are partially imaged at the right lung base;
this could reflect mild pneumonia.

The liver and spleen are unremarkable in appearance. A small stone
is seen at the base of the gallbladder; the gallbladder is otherwise
unremarkable in appearance. The pancreas and adrenal glands are
unremarkable.

The kidneys are unremarkable in appearance. There is no evidence of
hydronephrosis. No renal or ureteral stones are seen. No perinephric
stranding is appreciated.

No free fluid is identified. The small bowel is unremarkable in
appearance. The stomach is within normal limits. No acute vascular
abnormalities are seen.

The appendix is normal in caliber and contains air, without evidence
for appendicitis. The colon is largely filled with stool, raising
question for mild constipation. The rectum is distended to 8.1 cm in
transverse dimension. The colon is otherwise unremarkable in
appearance.

The bladder is largely decompressed and grossly unremarkable in
appearance. The uterus is within normal limits, though difficult to
fully assess. The ovaries are relatively symmetric; no suspicious
adnexal masses are seen. No inguinal lymphadenopathy is seen.

No acute osseous abnormalities are identified. Facet disease is
noted along the lumbar spine.
IMPRESSION: 1. Colon largely filled with stool, raising question for mild
constipation. The rectum is distended to 8.1 cm in transverse
dimension.
2. Vague patchy opacities partially imaged at the right lung base;
this could reflect mild pneumonia.
3. Cholelithiasis; gallbladder otherwise unremarkable in appearance.

## 2014-06-26 ENCOUNTER — Emergency Department
Admission: EM | Admit: 2014-06-26 | Discharge: 2014-06-26 | Disposition: A | Discharge status: Home / Self Care | Payer: Medicare Other | Attending: Emergency Medicine | Admitting: Emergency Medicine

## 2014-06-26 ENCOUNTER — Encounter: Payer: Self-pay | Admitting: Emergency Medicine

## 2014-06-26 DIAGNOSIS — F209 Schizophrenia, unspecified: Secondary | ICD-10-CM

## 2014-06-26 DIAGNOSIS — Z7982 Long term (current) use of aspirin: Secondary | ICD-10-CM | POA: Diagnosis not present

## 2014-06-26 DIAGNOSIS — Z791 Long term (current) use of non-steroidal anti-inflammatories (NSAID): Secondary | ICD-10-CM | POA: Diagnosis not present

## 2014-06-26 DIAGNOSIS — F201 Disorganized schizophrenia: Secondary | ICD-10-CM | POA: Diagnosis not present

## 2014-06-26 DIAGNOSIS — Z720 Tobacco use: Secondary | ICD-10-CM | POA: Insufficient documentation

## 2014-06-26 DIAGNOSIS — R451 Restlessness and agitation: Secondary | ICD-10-CM

## 2014-06-26 DIAGNOSIS — Z79899 Other long term (current) drug therapy: Secondary | ICD-10-CM | POA: Diagnosis not present

## 2014-06-26 DIAGNOSIS — R443 Hallucinations, unspecified: Secondary | ICD-10-CM | POA: Diagnosis present

## 2014-06-26 DIAGNOSIS — F1310 Sedative, hypnotic or anxiolytic abuse, uncomplicated: Secondary | ICD-10-CM | POA: Diagnosis not present

## 2014-06-26 HISTORY — DX: Anxiety disorder, unspecified: F41.9

## 2014-06-26 LAB — COMPREHENSIVE METABOLIC PANEL
ALBUMIN: 4.5 g/dL (ref 3.5–5.0)
ALT: 13 U/L — AB (ref 14–54)
AST: 20 U/L (ref 15–41)
Alkaline Phosphatase: 75 U/L (ref 38–126)
Anion gap: 7 (ref 5–15)
BUN: 10 mg/dL (ref 6–20)
CALCIUM: 9.6 mg/dL (ref 8.9–10.3)
CO2: 25 mmol/L (ref 22–32)
CREATININE: 0.64 mg/dL (ref 0.44–1.00)
Chloride: 106 mmol/L (ref 101–111)
GFR calc Af Amer: >60 mL/min (ref >60)
GFR calc non Af Amer: >60 mL/min (ref >60)
Glucose, Bld: 143 mg/dL — ABNORMAL HIGH (ref 65–99)
Potassium: 3.3 mmol/L — ABNORMAL LOW (ref 3.5–5.1)
SODIUM: 138 mmol/L (ref 135–145)
Total Bilirubin: 0.5 mg/dL (ref 0.3–1.2)
Total Protein: 8.9 g/dL — ABNORMAL HIGH (ref 6.5–8.1)

## 2014-06-26 LAB — URINE DRUG SCREEN, QUALITATIVE (ARMC ONLY)
AMPHETAMINES, UR SCREEN: NOT DETECTED
BARBITURATES, UR SCREEN: NOT DETECTED
BENZODIAZEPINE, UR SCRN: POSITIVE — AB
Cannabinoid 50 Ng, Ur ~~LOC~~: NOT DETECTED
Cocaine Metabolite,Ur ~~LOC~~: NOT DETECTED
MDMA (Ecstasy)Ur Screen: NOT DETECTED
Methadone Scn, Ur: NOT DETECTED
Opiate, Ur Screen: NOT DETECTED
Phencyclidine (PCP) Ur S: NOT DETECTED
Tricyclic, Ur Screen: NOT DETECTED

## 2014-06-26 LAB — URINALYSIS COMPLETE WITH MICROSCOPIC (ARMC ONLY)
Bilirubin Urine: NEGATIVE
GLUCOSE, UA: NEGATIVE mg/dL
HGB URINE DIPSTICK: NEGATIVE
Ketones, ur: NEGATIVE mg/dL
LEUKOCYTES UA: NEGATIVE
Nitrite: NEGATIVE
PROTEIN: NEGATIVE mg/dL
RBC / HPF: NONE SEEN RBC/hpf (ref 0–5)
Specific Gravity, Urine: 1.018 (ref 1.005–1.030)
pH: 5 (ref 5.0–8.0)

## 2014-06-26 LAB — CBC
HEMATOCRIT: 37.3 % (ref 35.0–47.0)
HEMOGLOBIN: 12.3 g/dL (ref 12.0–16.0)
MCH: 28.7 pg (ref 26.0–34.0)
MCHC: 33.1 g/dL (ref 32.0–36.0)
MCV: 86.6 fL (ref 80.0–100.0)
Platelets: 360 10*3/uL (ref 150–440)
RBC: 4.3 MIL/uL (ref 3.80–5.20)
RDW: 14 % (ref 11.5–14.5)
WBC: 11.9 10*3/uL — AB (ref 3.6–11.0)

## 2014-06-26 LAB — ETHANOL: Alcohol, Ethyl (B): <5 mg/dL (ref <5)

## 2014-06-26 MED ORDER — LORAZEPAM 2 MG PO TABS
2.0000 mg | ORAL_TABLET | ORAL | Status: AC
  Administered 2014-06-26: 2 mg via ORAL

## 2014-06-26 MED ORDER — LORAZEPAM 2 MG PO TABS
ORAL_TABLET | ORAL | Status: AC
  Administered 2014-06-26: 2 mg via ORAL
  Filled 2014-06-26: qty 1

## 2014-06-26 NOTE — ED Notes (Signed)
Pt brought to ed by police [facility].

## 2014-06-26 NOTE — Discharge Instructions (Signed)
Continue current care. Follow with her usual provider. Return to the emergency department if there are any other urgent concerns.  Schizophrenia Schizophrenia is a mental illness. It may cause disturbed or disorganized thinking, speech, or behavior. People with schizophrenia have problems functioning in one or more areas of life: work, school, home, or relationships. People with schizophrenia are at increased risk for suicide, certain chronic physical illnesses, and unhealthy behaviors, such as smoking and drug use. People who have family members with schizophrenia are at higher risk of developing the illness. Schizophrenia affects men and women equally but usually appears at an earlier age (teenage or early adult years) in men.  SYMPTOMS The earliest symptoms are often subtle (prodrome) and may go unnoticed until the illness becomes more severe (first-break psychosis). Symptoms of schizophrenia may be continuous or may come and go in severity. Episodes often are triggered by major life events, such as family stress, college, [employer], marriage, pregnancy or child birth, divorce, or loss of a loved one. People with schizophrenia may see, hear, or feel things that do not exist (hallucinations). They may have false beliefs in spite of obvious proof to the contrary (delusions). Sometimes speech is incoherent or behavior is odd or withdrawn.  DIAGNOSIS Schizophrenia is diagnosed through an assessment by your caregiver. Your caregiver will ask questions about your thoughts, behavior, mood, and ability to function in daily life. Your caregiver may ask questions about your medical history and use of alcohol or drugs, including prescription medication. Your caregiver may also order blood tests and imaging exams. Certain medical conditions and substances can cause symptoms that resemble schizophrenia. Your caregiver may refer you to a mental health specialist for evaluation. There are three major criterion  for a diagnosis of schizophrenia:  Two or more of the following five symptoms are present for a month or longer:  Delusions. Often the delusions are that you are being attacked, harassed, cheated, persecuted or conspired against (persecutory delusions).  Hallucinations.   Disorganized speech that does not make sense to others.  Grossly disorganized (confused or unfocused) behavior or extremely overactive or underactive motor activity (catatonia).  Negative symptoms such as bland or blunted emotions (flat affect), loss of will power (avolition), and withdrawal from social contacts (social isolation).  Level of functioning in one or more major areas of life (work, school, relationships, or self-care) is markedly below the level of functioning before the onset of illness.   There are continuous signs of illness (either mild symptoms or decreased level of functioning) for at least 6 months or longer. TREATMENT  Schizophrenia is a long-term illness. It is best controlled with continuous treatment rather than treatment only when symptoms occur. The following treatments are used to manage schizophrenia:  Medication--Medication is the most effective and important form of treatment for schizophrenia. Antipsychotic medications are usually prescribed to help manage schizophrenia. Other types of medication may be added to relieve any symptoms that may occur despite the use of antipsychotic medications.  Counseling or talk therapy--Individual, group, or family counseling may be helpful in providing education, support, and guidance. Many people with schizophrenia also benefit from social skills and job skills (vocational) training. A combination of medication and counseling is best for managing the disorder over time. A procedure in which electricity is applied to the brain through the scalp (electroconvulsive therapy) may be used to treat catatonic schizophrenia or schizophrenia in people who cannot take  or do not respond to medication and counseling. Document Released: 12/17/1999 Document Revised: 08/21/2012  Document Reviewed: 03/13/2012 [facility] Patient Information 2015 [place], [place]. This information is not intended to replace advice given to you by your health care provider. Make sure you discuss any questions you have with your health care provider.

## 2014-06-26 NOTE — ED Notes (Signed)
Pt to ed with police.  Pt states she was seeing the light change colors after she was given the wrong medicine.  Pt then states " I broke something, I am supposed to go to jail" per police officer she did not break anything that they know.

## 2014-06-26 NOTE — ED Notes (Signed)

## 2014-06-26 NOTE — ED Notes (Signed)
BEHAVIORAL HEALTH ROUNDING Patient sleeping: No. Patient alert and oriented: yes Behavior appropriate: Yes.  ; If no, describe:  Nutrition and fluids offered: Yes  Toileting and hygiene offered: No Sitter present: yes Law enforcement present: Yes  

## 2014-06-26 NOTE — Consult Note (Signed)
[facility] Face-to-Face Psychiatry Consult   Reason for Consult:  Consult for this 49 year old woman with a history of schizophrenia who was brought here without commitment papers from her group home. Referring Physician:  Thomasene Lot Patient Identification: Amy Villarreal MRN:  [id number] Principal Diagnosis: Schizophrenia Diagnosis:   Patient Active Problem List   Diagnosis Date Noted  . Schizophrenia [F20.9] 06/26/2014    Total Time spent with patient: 45 minutes  Subjective:   Amy Villarreal is a 49 y.o. female patient admitted with "the light was just wrong".  HPI:  Patient was brought here from her group home with minimal paperwork except for her list of medications. She states that SHE WAS FEELING AGITATED THIS AFTERNOON AND THAT SHE the light that she was seeing was incorrect somehow. She admits that she broke chair and a lamp. She denies that she was trying to hurt anyone. Denies suicidal ideation. She can't give any really coherent explanation for the whole thing. Doesn't seem to been particularly angry at anyone. She is compliant with her prescribed outpatient medicines.  Long history of schizophrenia. Multiple medications have been tried. Currently on clozapine. Hasn't act team in place. Has a history of this kind of behavior of breaking things out of some kind of psychotic agitation. Doesn't appear to usually be of any harm to other people.  Medical history: Diabetes high blood pressure gastric reflux disease  Social history: Resides in a group home. Unclear if she is still in contact with much of her family.  Family history: Unknown  Substance abuse history: Denies any significant current or past substance abuse problem. HPI Elements:   Quality:  Agitated behavior. Severity:  Moderate. Timing:  Happen this afternoon. Duration:  Usually transient. Context:  Unclear context.  Past Medical History:  Past Medical History  Diagnosis Date  . Anxiety    No past surgical history  on file. Family History: No family history on file. Social History:  History  Alcohol Use No     History  Drug Use No    History   Social History  . Marital Status: Single    Spouse Name: N/A  . Number of Children: N/A  . Years of Education: N/A   Social History Main Topics  . Smoking status: Current Every Day Smoker  . Smokeless tobacco: Not on file  . Alcohol Use: No  . Drug Use: No  . Sexual Activity: Not on file   Other Topics Concern  . None   Social History Narrative  . None   Additional Social History:                          Allergies:  No Known Allergies  Labs:  Results for orders placed or performed during the hospital encounter of 06/26/14 (from the past 48 hour(s))  CBC     Status: Abnormal   Collection Time: 06/26/14  2:57 PM  Result Value Ref Range   WBC 11.9 (H) 3.6 - 11.0 K/uL   RBC 4.30 3.80 - 5.20 MIL/uL   Hemoglobin 12.3 12.0 - 16.0 g/dL   HCT 37.3 35.0 - 47.0 %   MCV 86.6 80.0 - 100.0 fL   MCH 28.7 26.0 - 34.0 pg   MCHC 33.1 32.0 - 36.0 g/dL   RDW 14.0 11.5 - 14.5 %   Platelets 360 150 - 440 K/uL  Comprehensive metabolic panel     Status: Abnormal   Collection Time: 06/26/14  2:57 PM  Result Value Ref Range   Sodium 138 135 - 145 mmol/L   Potassium 3.3 (L) 3.5 - 5.1 mmol/L   Chloride 106 101 - 111 mmol/L   CO2 25 22 - 32 mmol/L   Glucose, Bld 143 (H) 65 - 99 mg/dL   BUN 10 6 - 20 mg/dL   Creatinine, Ser 0.64 0.44 - 1.00 mg/dL   Calcium 9.6 8.9 - 10.3 mg/dL   Total Protein 8.9 (H) 6.5 - 8.1 g/dL   Albumin 4.5 3.5 - 5.0 g/dL   AST 20 15 - 41 U/L   ALT 13 (L) 14 - 54 U/L   Alkaline Phosphatase 75 38 - 126 U/L   Total Bilirubin 0.5 0.3 - 1.2 mg/dL   GFR calc non Af Amer >60 >60 mL/min   GFR calc Af Amer >60 >60 mL/min    Comment: (NOTE) The eGFR has been calculated using the CKD EPI equation. This calculation has not been validated in all clinical situations. eGFR's persistently <60 mL/min signify possible Chronic  Kidney Disease.    Anion gap 7 5 - 15  Ethanol (ETOH)     Status: None   Collection Time: 06/26/14  2:57 PM  Result Value Ref Range   Alcohol, Ethyl (B) <5 <5 mg/dL    Comment:        LOWEST DETECTABLE LIMIT FOR SERUM ALCOHOL IS 5 mg/dL FOR MEDICAL PURPOSES ONLY   Urine Drug Screen, Qualitative ([facility] only)     Status: Abnormal   Collection Time: 06/26/14  2:57 PM  Result Value Ref Range   Tricyclic, Ur Screen NONE DETECTED NONE DETECTED   Amphetamines, Ur Screen NONE DETECTED NONE DETECTED   MDMA (Ecstasy)Ur Screen NONE DETECTED NONE DETECTED   Cocaine Metabolite,Ur [place] NONE DETECTED NONE DETECTED   Opiate, Ur Screen NONE DETECTED NONE DETECTED   Phencyclidine (PCP) Ur S NONE DETECTED NONE DETECTED   Cannabinoid 50 Ng, Ur [place] NONE DETECTED NONE DETECTED   Barbiturates, Ur Screen NONE DETECTED NONE DETECTED   Benzodiazepine, Ur Scrn POSITIVE (A) NONE DETECTED   Methadone Scn, Ur NONE DETECTED NONE DETECTED    Comment: (NOTE) 932  Tricyclics, urine               Cutoff 1000 ng/mL 200  Amphetamines, urine             Cutoff 1000 ng/mL 300  MDMA (Ecstasy), urine           Cutoff 500 ng/mL 400  Cocaine Metabolite, urine       Cutoff 300 ng/mL 500  Opiate, urine                   Cutoff 300 ng/mL 600  Phencyclidine (PCP), urine      Cutoff 25 ng/mL 700  Cannabinoid, urine              Cutoff 50 ng/mL 800  Barbiturates, urine             Cutoff 200 ng/mL 900  Benzodiazepine, urine           Cutoff 200 ng/mL 1000 Methadone, urine                Cutoff 300 ng/mL 1100 1200 The urine drug screen provides only a preliminary, unconfirmed 1300 analytical test result and should not be used for non-medical 1400 purposes. Clinical consideration and professional judgment should 1500 be applied to any positive drug screen result due to possible 1600 interfering substances. A  more specific alternate chemical method 1700 must be used in order to obtain a confirmed analytical result.  1800 Gas  chromato graphy / mass spectrometry (GC/MS) is the preferred 1900 confirmatory method.   Urinalysis complete, with microscopic ([facility] only)     Status: Abnormal   Collection Time: 06/26/14  2:57 PM  Result Value Ref Range   Color, Urine YELLOW (A) YELLOW   APPearance CLOUDY (A) CLEAR   Glucose, UA NEGATIVE NEGATIVE mg/dL   Bilirubin Urine NEGATIVE NEGATIVE   Ketones, ur NEGATIVE NEGATIVE mg/dL   Specific Gravity, Urine 1.018 1.005 - 1.030   Hgb urine dipstick NEGATIVE NEGATIVE   pH 5.0 5.0 - 8.0   Protein, ur NEGATIVE NEGATIVE mg/dL   Nitrite NEGATIVE NEGATIVE   Leukocytes, UA NEGATIVE NEGATIVE   RBC / HPF NONE SEEN 0 - 5 RBC/hpf   WBC, UA 0-5 0 - 5 WBC/hpf   Bacteria, UA RARE (A) NONE SEEN   Squamous Epithelial / LPF TOO NUMEROUS TO COUNT (A) NONE SEEN   Mucous PRESENT     Vitals: Blood pressure 143/98, pulse 112, temperature 98.2 F (36.8 C), temperature source Oral, resp. rate 18, height 5' 7"  (1.702 m), weight 118.842 kg (262 lb), SpO2 97 %.  Risk to Self: Is patient at risk for suicide?: No, but patient needs Medical Clearance Risk to Others:   Prior Inpatient Therapy:   Prior Outpatient Therapy:    No current facility-administered medications for this encounter.   Current Outpatient Prescriptions  Medication Sig Dispense Refill  . allopurinol (ZYLOPRIM) 100 MG tablet Take 100 mg by mouth 2 (two) times daily.    Marland Kitchen aspirin EC 81 MG tablet Take 81 mg by mouth daily.    . bisacodyl (DULCOLAX) 10 MG suppository Place 10 mg rectally as needed for moderate constipation.    . cloZAPine (CLOZARIL) 100 MG tablet Take 100 mg by mouth at bedtime.    Marland Kitchen glipiZIDE (GLUCOTROL) 10 MG tablet Take 10 mg by mouth daily before breakfast.    . lisinopril (PRINIVIL,ZESTRIL) 10 MG tablet Take 10 mg by mouth daily.    Marland Kitchen LORazepam (ATIVAN) 0.5 MG tablet Take 0.5 mg by mouth 2 (two) times daily.    . meloxicam (MOBIC) 7.5 MG tablet Take 7.5 mg by mouth daily.    Marland Kitchen omeprazole (PRILOSEC) 20 MG  capsule Take 20 mg by mouth daily.    . risperiDONE (RISPERDAL) 3 MG tablet Take 3 mg by mouth at bedtime.    . senna-docusate (SENOKOT-S) 8.6-50 MG per tablet Take 1 tablet by mouth 2 (two) times daily.      Musculoskeletal: Strength & Muscle Tone: within normal limits Gait & Station: normal Patient leans: N/A  Psychiatric Specialty Exam: Physical Exam  Constitutional: She appears well-developed and well-nourished.  HENT:  Head: Normocephalic and atraumatic.  Eyes: Conjunctivae are normal. Pupils are equal, round, and reactive to light.  Neck: Normal range of motion.  Cardiovascular: Normal heart sounds.   Respiratory: Effort normal.  GI: Soft.  Musculoskeletal: Normal range of motion.  Neurological: She is alert.  Skin: Skin is warm and dry.  Psychiatric: Her affect is labile. Her speech is rapid and/or pressured. She is agitated. Thought content is delusional. Cognition and memory are impaired. She expresses impulsivity.    Review of Systems  Constitutional: Negative.   HENT: Negative.   Eyes: Negative.   Respiratory: Negative.   Cardiovascular: Negative.   Gastrointestinal: Negative.   Musculoskeletal: Negative.   Skin: Negative.   Neurological:  Negative.   Psychiatric/Behavioral: Positive for memory loss. Negative for depression, suicidal ideas, hallucinations and substance abuse. The patient is nervous/anxious and has insomnia.     Blood pressure 143/98, pulse 112, temperature 98.2 F (36.8 C), temperature source Oral, resp. rate 18, height 5' 7"  (1.702 m), weight 118.842 kg (262 lb), SpO2 97 %.Body mass index is 41.03 kg/(m^2).  General Appearance: Casual  Eye Contact::  Good  Speech:  Pressured  Volume:  Normal  Mood:  Euthymic  Affect:  Inappropriate  Thought Process:  Circumstantial  Orientation:  Full (Time, Place, and Person)  Thought Content:  Hallucinations: Auditory  Suicidal Thoughts:  No  Homicidal Thoughts:  No  Memory:  Immediate;   Good Recent;    Fair Remote;   Fair  Judgement:  Fair  Insight:  Fair  Psychomotor Activity:  Increased  Concentration:  Fair  Recall:  [employer] of Knowledge:Fair  Language: Good  Akathisia:  No  Handed:  Right  AIMS (if indicated):     Assets:  Communication Skills Desire for Improvement Housing Social Support  ADL's:  Intact  Cognition: WNL  Sleep:      Medical Decision Making: Established Problem, Stable/Improving (1), Review of Psycho-Social Stressors (1), Review or order clinical lab tests (1), Discuss test with performing physician (1) and Review of Medication Regimen & Side Effects (2)  Treatment Plan Summary: Plan Patient appears to be at her baseline. This is a repeated behavior she has had. She is not currently suicidal or homicidal. Not under involuntary commitment. Patient has active team services in place and a safe place to live. Psychiatry staff has been in contact with the group home and they are agreeable to picking her up. Plan is for her to be released back to her group home. She will follow up with her act team. No change to current medicine. Supportive counseling completed.  Plan:  Patient does not meet criteria for psychiatric inpatient admission. Discussed crisis plan, support from social network, calling 911, coming to the Emergency Department, and calling Suicide Hotline. Disposition: Discharge at the discretion of the emergency room physician  Alethia Berthold 06/26/2014 6:49 PM

## 2014-06-26 NOTE — BHH Counselor (Signed)
Spoke with [facility] [phone]), about pt. Being discharged. She asked Clinical research associate to call her ACT Team. Writer called [facility] ([phone]) and he stated he couldn't do it and the Group Home knows this.  Writer called called [facility] [phone]) back and they were able to arranged for someone to pick up here up. Writer updated ER MD (Dr. Carollee Massed) and pt. Nurse (Shannin H.).

## 2014-06-26 NOTE — BH Assessment (Signed)
Assessment Note  Amy Villarreal is an 49 y.o. female who presents to the due to breaking things at the Group Home and reporting A/H. During the interview the patient reported she was upset about having to pay for items she has broke in the house. She stated, "I'm broke, help me out, I need to move. They getting all my money. You see I got mad and broke a chair and a lamp and now I get to pay for it. Please help me find a New Group Home."    Spoke with Group [facility] [phone]), they reported majority of the same thing the patient shared. Patient became upset and wanted to leave due to being told she could not borrow cigarettes from other residents. Thus, she started breaking things.  During the interview, the patient was animated. At times she became tearful but when writer asked her to stop, she was able to stop as soon as she was asked and continue the conversation.   Axis I: Schizophrenia Axis III:  Past Medical History  Diagnosis Date  . Anxiety    Axis IV: economic problems, educational problems, other psychosocial or environmental problems, problems related to social environment and problems with access to health care services  Past Medical History:  Past Medical History  Diagnosis Date  . Anxiety     No past surgical history on file.  Family History: No family history on file.  Social History:  reports that she has been smoking.  She does not have any smokeless tobacco history on file. She reports that she does not drink alcohol or use illicit drugs.  Additional Social History:  Alcohol / Drug Use Pain Medications: None Reported Prescriptions: None Reported Over the Counter: None Reported History of alcohol / drug use?: No history of alcohol / drug abuse Longest period of sobriety (when/how long):  (None Reported) Negative Consequences of Use:  (None Reported) Withdrawal Symptoms:  (None Reported)  CIWA: CIWA-Ar BP: (!) 143/98 mmHg Pulse Rate: (!)  112 COWS:    Allergies: No Known Allergies  Home Medications:  (Not in a hospital admission)  OB/GYN Status:  No LMP recorded. Patient has had a hysterectomy.  General Assessment Data Location of Assessment: [facility] ED TTS Assessment: In system Is this a Tele or Face-to-Face Assessment?: Face-to-Face Is this an Initial Assessment or a Re-assessment for this encounter?: Initial Assessment Marital status: Single Maiden name: n/a Is patient pregnant?: No Pregnancy Status: No Living Arrangements: Group Home ([facility]-[phone]) Can pt return to current living arrangement?: Yes Admission Status: Voluntary Is patient capable of signing voluntary admission?: Yes Referral Source: Self/Family/Friend Insurance type: Medicare  Medical Screening Exam [facility] Walk-in ONLY) Medical Exam completed: Yes  Crisis Care Plan Living Arrangements: Group Home ([facility]-[phone]) Name of Psychiatrist: n/a Name of Therapist: n/a  Education Status Is patient currently in school?: No Current Grade: n/a Highest grade of school patient has completed: Unknown Name of school: n/a Contact person: n/a  Risk to self with the past 6 months Suicidal Ideation: No Has patient been a risk to self within the past 6 months prior to admission? : No Suicidal Intent: No Has patient had any suicidal intent within the past 6 months prior to admission? : No Is patient at risk for suicide?: No Suicidal Plan?: No Has patient had any suicidal plan within the past 6 months prior to admission? : No Access to Means: No What has been your use of drugs/alcohol within the last  12 months?: None Reported Previous Attempts/Gestures: No How many times?: 0 Other Self Harm Risks: None Reported Triggers for Past Attempts: None known Intentional Self Injurious Behavior: None Family Suicide History: Unknown Recent stressful life event(s): Other (Comment), Conflict (Comment) Persecutory voices/beliefs?:  No Depression: No Depression Symptoms: Feeling angry/irritable, Feeling worthless/self pity, Loss of interest in usual pleasures Substance abuse history and/or treatment for substance abuse?: No Suicide prevention information given to non-admitted patients: Not applicable  Risk to Others within the past 6 months Homicidal Ideation: No Does patient have any lifetime risk of violence toward others beyond the six months prior to admission? : No Thoughts of Harm to Others: No Current Homicidal Intent: No Current Homicidal Plan: No Access to Homicidal Means: No Identified Victim: None Reported History of harm to others?: No Assessment of Violence: None Noted Violent Behavior Description: None Reported Does patient have access to weapons?: No Criminal Charges Pending?: No Does patient have a court date: No Is patient on probation?: No  Psychosis Hallucinations: None noted Delusions: None noted  Mental Status Report Appearance/Hygiene: Unremarkable, In scrubs, In hospital gown Eye Contact: Poor Motor Activity: Unremarkable, Freedom of movement Speech: Logical/coherent, Soft, Slow, Unremarkable Level of Consciousness: Alert, Irritable Mood: Depressed, Anxious, Angry, Irritable Affect: Appropriate to circumstance, Irritable, Sad Anxiety Level: Minimal Thought Processes: Coherent, Relevant Judgement: Unimpaired Orientation: Person, Place, Time, Situation Obsessive Compulsive Thoughts/Behaviors: None  Cognitive Functioning Concentration: Normal Memory: Recent Intact, Remote Intact IQ: Average Insight: Poor Impulse Control: Poor Appetite: Good Weight Loss: 0 Weight Gain: 0 Sleep: No Change Total Hours of Sleep: 8 Vegetative Symptoms: None  ADLScreening [facility] Assessment Services) Patient's cognitive ability adequate to safely complete daily activities?: Yes Patient able to express need for assistance with ADLs?: Yes Independently performs ADLs?: Yes (appropriate for  developmental age)  Prior Inpatient Therapy Prior Inpatient Therapy: Yes Prior Therapy Dates: 10/2000 Prior Therapy Facilty/Provider(s): [facility] Reason for Treatment: Psychosis  Prior Outpatient Therapy Prior Outpatient Therapy: Yes Prior Therapy Facilty/Provider(s): [facility] Reason for Treatment: Schizophrenia Does patient have an ACCT team?: No Does patient have Intensive In-House Services?  : No Does patient have [facility] services? : No Does patient have P4CC services?: No  ADL Screening (condition at time of admission) Patient's cognitive ability adequate to safely complete daily activities?: Yes Patient able to express need for assistance with ADLs?: Yes Independently performs ADLs?: Yes (appropriate for developmental age)       Abuse/Neglect Assessment (Assessment to be complete while patient is alone) Physical Abuse: Denies Verbal Abuse: Denies Sexual Abuse: Denies Exploitation of patient/patient's resources: Denies Self-Neglect: Denies Values / Beliefs Cultural Requests During Hospitalization: None Spiritual Requests During Hospitalization: None Consults Spiritual Care Consult Needed: No Social Work Consult Needed: No Merchant navy officer (For Healthcare) Does patient have an advance directive?: No Would patient like information on creating an advanced directive?: Yes English as a second language teacher given    Additional Information 1:1 In Past 12 Months?: No CIRT Risk: No Elopement Risk: No Does patient have medical clearance?: Yes  Child/Adolescent Assessment Running Away Risk: Denies (pt. is an adult)  Disposition:  Disposition Initial Assessment Completed for this Encounter: Yes Disposition of Patient: Other dispositions Other disposition(s): To current provider ([facility])  On Site Evaluation by:   Reviewed with Physician:    Morley Kos 06/26/2014 7:32 PM

## 2014-06-26 NOTE — ED Provider Notes (Signed)
[facility] Emergency Department Provider Note  ____________________________________________  Time seen: 1555  I have reviewed the triage vital signs and the nursing notes.   HISTORY  Chief Complaint Hallucinations Agitation, possibly breaking things at a group home.    HPI Amy Villarreal is a 49 y.o. female with a history of psychiatric issues who stays in a group home. She has been seen in the emergency department many times in the past although she has not been here in the past few months. Today she is brought by police. The group home had called 911. Some of the details of what occurred at the group home is unclear. The patient reports that she became upset and was breaking things. The police who brought the patient to the emergency department report that they were not aware of anything that she had broken. The patient reports she had broken a chair and a lamp. At this particular group home, with residents break things, they have there monthly suspending reduced so that they pain for the things that are broken. The patient is upset about this and a little agitated as she says she doesn't have that much money and does not 1 [place] up a 4 broken things. She says that they're given a take all her money.  The patient denies   abdominal pain, chest pain, shortness of breath, headache, were other dysfunction. She does have some swelling in her legs.    Past Medical History  Diagnosis Date  . Anxiety     Patient Active Problem List   Diagnosis Date Noted  . Schizophrenia 06/26/2014    No past surgical history on file.  Current Outpatient Rx  Name  Route  Sig  Dispense  Refill  . allopurinol (ZYLOPRIM) 100 MG tablet   Oral   Take 100 mg by mouth 2 (two) times daily.         Marland Kitchen aspirin EC 81 MG tablet   Oral   Take 81 mg by mouth daily.         . bisacodyl (DULCOLAX) 10 MG suppository   Rectal   Place 10 mg rectally as needed for moderate  constipation.         . cloZAPine (CLOZARIL) 100 MG tablet   Oral   Take 100 mg by mouth at bedtime.         Marland Kitchen glipiZIDE (GLUCOTROL) 10 MG tablet   Oral   Take 10 mg by mouth daily before breakfast.         . lisinopril (PRINIVIL,ZESTRIL) 10 MG tablet   Oral   Take 10 mg by mouth daily.         Marland Kitchen LORazepam (ATIVAN) 0.5 MG tablet   Oral   Take 0.5 mg by mouth 2 (two) times daily.         . meloxicam (MOBIC) 7.5 MG tablet   Oral   Take 7.5 mg by mouth daily.         Marland Kitchen omeprazole (PRILOSEC) 20 MG capsule   Oral   Take 20 mg by mouth daily.         . risperiDONE (RISPERDAL) 3 MG tablet   Oral   Take 3 mg by mouth at bedtime.         . senna-docusate (SENOKOT-S) 8.6-50 MG per tablet   Oral   Take 1 tablet by mouth 2 (two) times daily.           Allergies Review of patient's allergies  indicates no known allergies.  No family history on file.  Social History History  Substance Use Topics  . Smoking status: Current Every Day Smoker  . Smokeless tobacco: Not on file  . Alcohol Use: No    Review of Systems  Constitutional: Negative for fever. ENT: Negative for sore throat. Cardiovascular: Negative for chest pain. Respiratory: Negative for shortness of breath. Gastrointestinal: Negative for abdominal pain, vomiting and diarrhea. Genitourinary: Negative for dysuria. Musculoskeletal: No myalgias or injuries. Skin: Negative for rash. Neurological: Negative for headaches Psychiatric: Agitation of the group home. See history of present illness  10-point ROS otherwise negative.  ____________________________________________   PHYSICAL EXAM:  VITAL SIGNS: ED Triage Vitals  Enc Vitals Group     BP 06/26/14 1453 143/98 mmHg     Pulse Rate 06/26/14 1453 112     Resp 06/26/14 1453 18     Temp 06/26/14 1453 98.2 F (36.8 C)     Temp Source 06/26/14 1453 Oral     SpO2 06/26/14 1453 97 %     Weight 06/26/14 1453 262 lb (118.842 kg)     Height  06/26/14 1453 5\' 7"  (1.702 m)     Head Cir --      Peak Flow --      Pain Score --      Pain Loc --      Pain Edu? --      Excl. in GC? --     Constitutional:  Alert, communicative. She appears to have some cognitive limitations. ENT   Head: Normocephalic and atraumatic.   Nose: No congestion/rhinnorhea.   Mouth/Throat: Mucous membranes are moist. Cardiovascular: Normal rate, regular rhythm, no murmur noted Respiratory:  Normal respiratory effort, no tachypnea.    Breath sounds are clear and equal bilaterally.  Gastrointestinal: Soft and nontender. No distention.  Back: No muscle spasm, no tenderness, no CVA tenderness. Musculoskeletal: No deformity noted. Nontender with normal range of motion in all extremities. There is nonpitting edema of both legs that appears chronic. Neurologic:  Verbal, communicative. No gross focal neurologic deficits are appreciated.  Skin:  Skin is warm, dry. No rash noted. Psychiatric: Patient is interactive and dynamic. She appears to have some cognitive limitation. She asked for [place] not to send her back to the group home because she keeps breaking things and in turn they will take all her money. She is in no acute distress although she becomes slightly more agitated when we speak about the possibility of returning to the group home. ____________________________________________    LABS (pertinent positives/negatives)  CBC within normal limits except for minimal elevation of white blood cell count 11.9. Metabolic panel overall normal except for slight decrease in potassium at 3.3 and increase in glucose at 143. Urine drug screen is positive for benzodiazepines, otherwise negative. Urinalysis is negative for red blood cells or white blood cells. _______________________________________________________________   INITIAL IMPRESSION / ASSESSMENT AND PLAN / ED COURSE  Pertinent labs & imaging results that were available during my care of the patient  were reviewed by me and considered in my medical decision making (see chart for details).  49 year old female who I seen in the past appears to be at baseline. She is communicative. She has mild increase in upset, agitation, when we speak about going back to the group home. I've asked her to remain open to this with the intention of a better plan doubt long-term plan for her ongoing care. At this point we will continue her current medications  and asked Dr. Toni Amend to see her.  ----------------------------------------- 7:19 PM on 06/26/2014 -----------------------------------------  The patient has been seen by Dr. Toni Amend. She is cleared for return to her group home. We've contacted the group home and they are coming to pick her up. The patient seems more calm at this time and agrees with the current plan.   ____________________________________________   FINAL CLINICAL IMPRESSION(S) / ED DIAGNOSES  Final diagnoses:  Disorganized schizophrenia  Agitation      [name], MD 06/26/14 1921

## 2014-08-26 ENCOUNTER — Emergency Department
Admission: EM | Admit: 2014-08-26 | Discharge: 2014-08-27 | Disposition: A | Discharge status: Other Acute Inpatient Hospital | Payer: Medicare Other | Attending: Emergency Medicine | Admitting: Emergency Medicine

## 2014-08-26 DIAGNOSIS — Z79899 Other long term (current) drug therapy: Secondary | ICD-10-CM | POA: Diagnosis not present

## 2014-08-26 DIAGNOSIS — Z791 Long term (current) use of non-steroidal anti-inflammatories (NSAID): Secondary | ICD-10-CM | POA: Diagnosis not present

## 2014-08-26 DIAGNOSIS — Z7982 Long term (current) use of aspirin: Secondary | ICD-10-CM | POA: Insufficient documentation

## 2014-08-26 DIAGNOSIS — I10 Essential (primary) hypertension: Secondary | ICD-10-CM | POA: Insufficient documentation

## 2014-08-26 DIAGNOSIS — F201 Disorganized schizophrenia: Secondary | ICD-10-CM | POA: Diagnosis not present

## 2014-08-26 DIAGNOSIS — Z720 Tobacco use: Secondary | ICD-10-CM | POA: Diagnosis not present

## 2014-08-26 DIAGNOSIS — Z008 Encounter for other general examination: Secondary | ICD-10-CM | POA: Diagnosis present

## 2014-08-26 DIAGNOSIS — F209 Schizophrenia, unspecified: Secondary | ICD-10-CM | POA: Diagnosis present

## 2014-08-26 DIAGNOSIS — F203 Undifferentiated schizophrenia: Secondary | ICD-10-CM | POA: Diagnosis not present

## 2014-08-26 HISTORY — DX: Unspecified asthma, uncomplicated: J45.909

## 2014-08-26 HISTORY — DX: Gout, unspecified: M10.9

## 2014-08-26 HISTORY — DX: Gastro-esophageal reflux disease without esophagitis: K21.9

## 2014-08-26 HISTORY — DX: Unspecified osteoarthritis, unspecified site: M19.90

## 2014-08-26 HISTORY — DX: Unspecified urinary incontinence: R32

## 2014-08-26 HISTORY — DX: Age-related osteoporosis without current pathological fracture: M81.0

## 2014-08-26 HISTORY — DX: Constipation, unspecified: K59.00

## 2014-08-26 HISTORY — DX: Schizoaffective disorder, unspecified: F25.9

## 2014-08-26 HISTORY — DX: Obesity, unspecified: E66.9

## 2014-08-26 HISTORY — DX: Essential (primary) hypertension: I10

## 2014-08-26 LAB — URINALYSIS COMPLETE WITH MICROSCOPIC (ARMC ONLY)
BACTERIA UA: NONE SEEN
BILIRUBIN URINE: NEGATIVE
GLUCOSE, UA: NEGATIVE mg/dL
Hgb urine dipstick: NEGATIVE
Ketones, ur: NEGATIVE mg/dL
Leukocytes, UA: NEGATIVE
NITRITE: NEGATIVE
Protein, ur: NEGATIVE mg/dL
RBC / HPF: NONE SEEN RBC/hpf (ref 0–5)
SPECIFIC GRAVITY, URINE: 1.011 (ref 1.005–1.030)
pH: 6 (ref 5.0–8.0)

## 2014-08-26 LAB — CBC WITH DIFFERENTIAL/PLATELET
BASOS ABS: 0.1 10*3/uL (ref 0–0.1)
BASOS PCT: 1 %
EOS PCT: 2 %
Eosinophils Absolute: 0.2 10*3/uL (ref 0–0.7)
HCT: 37.4 % (ref 35.0–47.0)
Hemoglobin: 12.3 g/dL (ref 12.0–16.0)
Lymphocytes Relative: 31 %
Lymphs Abs: 3.7 10*3/uL — ABNORMAL HIGH (ref 1.0–3.6)
MCH: 28.5 pg (ref 26.0–34.0)
MCHC: 32.9 g/dL (ref 32.0–36.0)
MCV: 86.4 fL (ref 80.0–100.0)
MONO ABS: 0.9 10*3/uL (ref 0.2–0.9)
Monocytes Relative: 8 %
Neutro Abs: 7.1 10*3/uL — ABNORMAL HIGH (ref 1.4–6.5)
Neutrophils Relative %: 58 %
Platelets: 362 10*3/uL (ref 150–440)
RBC: 4.33 MIL/uL (ref 3.80–5.20)
RDW: 13.9 % (ref 11.5–14.5)
WBC: 12.1 10*3/uL — ABNORMAL HIGH (ref 3.6–11.0)

## 2014-08-26 LAB — BASIC METABOLIC PANEL
Anion gap: 7 (ref 5–15)
BUN: 10 mg/dL (ref 6–20)
CO2: 27 mmol/L (ref 22–32)
Calcium: 9.6 mg/dL (ref 8.9–10.3)
Chloride: 105 mmol/L (ref 101–111)
Creatinine, Ser: 0.67 mg/dL (ref 0.44–1.00)
GFR calc non Af Amer: >60 mL/min (ref >60)
Glucose, Bld: 100 mg/dL — ABNORMAL HIGH (ref 65–99)
Potassium: 3.4 mmol/L — ABNORMAL LOW (ref 3.5–5.1)
Sodium: 139 mmol/L (ref 135–145)

## 2014-08-26 LAB — URINE DRUG SCREEN, QUALITATIVE (ARMC ONLY)
Amphetamines, Ur Screen: NOT DETECTED
Barbiturates, Ur Screen: NOT DETECTED
Benzodiazepine, Ur Scrn: NOT DETECTED
Cannabinoid 50 Ng, Ur ~~LOC~~: NOT DETECTED
Cocaine Metabolite,Ur ~~LOC~~: NOT DETECTED
MDMA (ECSTASY) UR SCREEN: NOT DETECTED
Methadone Scn, Ur: NOT DETECTED
OPIATE, UR SCREEN: NOT DETECTED
Phencyclidine (PCP) Ur S: NOT DETECTED
TRICYCLIC, UR SCREEN: NOT DETECTED

## 2014-08-26 LAB — ACETAMINOPHEN LEVEL

## 2014-08-26 LAB — SALICYLATE LEVEL

## 2014-08-26 MED ORDER — ACETAMINOPHEN 500 MG PO TABS
ORAL_TABLET | ORAL | Status: AC
  Administered 2014-08-26: 1000 mg via ORAL
  Filled 2014-08-26: qty 2

## 2014-08-26 MED ORDER — ACETAMINOPHEN 500 MG PO TABS
1000.0000 mg | ORAL_TABLET | Freq: Once | ORAL | Status: AC
  Administered 2014-08-26: 1000 mg via ORAL

## 2014-08-26 NOTE — ED Notes (Signed)
Pt given orange juice and ginger ale.

## 2014-08-26 NOTE — ED Notes (Signed)
BEHAVIORAL HEALTH ROUNDING Patient sleeping: No. Patient alert and oriented: yes Behavior appropriate: Yes.  ;  Nutrition and fluids offered: Yes  Toileting and hygiene offered: Yes  Sitter present: no Law enforcement present: Yes   

## 2014-08-26 NOTE — ED Notes (Signed)

## 2014-08-26 NOTE — ED Notes (Signed)
BEHAVIORAL HEALTH ROUNDING Patient sleeping: Yes.   Patient alert and oriented: not applicable Behavior appropriate: Yes.  ; If no, describe:  Nutrition and fluids offered: Yes  Toileting and hygiene offered: Yes  Sitter present: yes Law enforcement present: Yes  

## 2014-08-26 NOTE — ED Notes (Signed)
Pt comes into the ED via EMS from "[facility]" on [address]the patient states she was seeing a light going on and off and asked the staff for help, states the "light was just fucking withy my mind" "so I threw a lamp and busted the window out, and threw them weights like the ones you lift and a ash tray".the patient is eating doritos and a brownie upon arrival .the patient was told she is not allowed to bring in food and was asked to throw them away.the patient is cooperative at present.Marland Kitchen

## 2014-08-26 NOTE — BH Assessment (Signed)
Assessment Note  Amy Villarreal is an 49 y.o. female presents to the ER due to having an outburst. Per the patient, she was at the Group Home ([facility]) and "the light start flickering on and off. I told them, it was bothering me, I told them, I needed help. Next I know I start breaking things. I broke the window; I broke the chair. I don't know. I need help..."  Patient is denying SI/HI and AV/H. She states, "I don't know what's wrong with me. I don't know. I need help. You think you can help me."  Per the Group Home Staff [phone]), the patient was in the kitchen eating lunch. When she got done, she as for a snack. She was told she couldn't have one, due to it just being lunch time. Patient went in the common area. That's' when the other residents call for the staff. She had already broke the items and that is when staff called 911.  Staff reports the patient is receiving ACTT Services from [facility].   Per Group Home staff, the patient is able to return.  Axis I: Schizophrenia Axis III:  Past Medical History  Diagnosis Date  . Anxiety   . Hypertension   . Gout   . GERD (gastroesophageal reflux disease)   . Asthma   . DJD (degenerative joint disease)   . Osteoporosis   . Obesity   . Constipation   . Urinary incontinence   . Schizoaffective disorder    Axis IV: other psychosocial or environmental problems, problems related to social environment and problems with primary support group  Past Medical History:  Past Medical History  Diagnosis Date  . Anxiety   . Hypertension   . Gout   . GERD (gastroesophageal reflux disease)   . Asthma   . DJD (degenerative joint disease)   . Osteoporosis   . Obesity   . Constipation   . Urinary incontinence   . Schizoaffective disorder     Past Surgical History  Procedure Laterality Date  . Abdominal hysterectomy      Family History: No family history on file.  Social History:  reports that she has been smoking  Cigarettes.  She does not have any smokeless tobacco history on file. She reports that she does not drink alcohol or use illicit drugs.  Additional Social History:  Alcohol / Drug Use Pain Medications: None Reported Prescriptions: None Reported Over the Counter: None Reported History of alcohol / drug use?: No history of alcohol / drug abuse Longest period of sobriety (when/how long): None Reported Negative Consequences of Use:  (None Reported) Withdrawal Symptoms:  (None Reported)  CIWA: CIWA-Ar BP: 112/72 mmHg Pulse Rate: 88 COWS:    Allergies: No Known Allergies  Home Medications:  (Not in a hospital admission)  OB/GYN Status:  No LMP recorded. Patient has had a hysterectomy.  General Assessment Data Location of Assessment: [facility] ED TTS Assessment: In system Is this a Tele or Face-to-Face Assessment?: Face-to-Face Is this an Initial Assessment or a Re-assessment for this encounter?: Initial Assessment Marital status: Single Maiden name: n/a Is patient pregnant?: No Pregnancy Status: No Living Arrangements: Group Home ([phone]) Can pt return to current living arrangement?: Yes Admission Status: Voluntary Is patient capable of signing voluntary admission?: Yes Referral Source: Self/Family/Friend Insurance type: Medicaid  Medical Screening Exam [facility] Walk-in ONLY) Medical Exam completed: Yes  Crisis Care Plan Living Arrangements: Group Home ([facility] [phone]) Name of Psychiatrist: Dr. Cherylann Ratel Name  of Therapist: [facility]  Education Status Is patient currently in school?: No Current Grade: n/a Highest grade of school patient has completed: 12th Name of school: n/a Contact person: n/a  Risk to self with the past 6 months Suicidal Ideation: No Has patient been a risk to self within the past 6 months prior to admission? : No Suicidal Intent: No Has patient had any suicidal intent within the past 6 months prior to admission? :  No Is patient at risk for suicide?: No Suicidal Plan?: No Has patient had any suicidal plan within the past 6 months prior to admission? : No Access to Means: No What has been your use of drugs/alcohol within the last 12 months?: None Reported Previous Attempts/Gestures: No How many times?: 0 Other Self Harm Risks: 0 Triggers for Past Attempts: None known Intentional Self Injurious Behavior: None Family Suicide History: No Recent stressful life event(s): Other (Comment) Persecutory voices/beliefs?: No Depression: No Depression Symptoms:  (None Reported) Substance abuse history and/or treatment for substance abuse?: No Suicide prevention information given to non-admitted patients: Not applicable  Risk to Others within the past 6 months Homicidal Ideation: No Does patient have any lifetime risk of violence toward others beyond the six months prior to admission? : No Thoughts of Harm to Others: No Current Homicidal Intent: No Current Homicidal Plan: No Access to Homicidal Means: No Identified Victim: None Reported History of harm to others?: No Assessment of Violence: In distant past Violent Behavior Description: Break items at the Group Home Does patient have access to weapons?: No Criminal Charges Pending?: No Does patient have a court date: No Is patient on probation?: No  Psychosis Hallucinations: None noted Delusions: None noted  Mental Status Report Appearance/Hygiene: Unremarkable, In scrubs, In hospital gown Eye Contact: Fair Motor Activity: Freedom of movement Speech: Unremarkable, Logical/coherent Level of Consciousness: Alert Mood: Pleasant, Euthymic Affect: Appropriate to circumstance Anxiety Level: Minimal Thought Processes: Coherent, Relevant Judgement: Unimpaired Orientation: Person, Place, Time, Situation, Appropriate for developmental age Obsessive Compulsive Thoughts/Behaviors: Minimal  Cognitive Functioning Concentration: Normal Memory: Recent  Intact, Remote Intact IQ: Average Insight: Poor Impulse Control: Poor Appetite: Fair Weight Loss: 0 Weight Gain: 0 Sleep: No Change Total Hours of Sleep: 8 Vegetative Symptoms: None  ADLScreening [facility] Assessment Services) Patient's cognitive ability adequate to safely complete daily activities?: Yes Patient able to express need for assistance with ADLs?: Yes Independently performs ADLs?: Yes (appropriate for developmental age)  Prior Inpatient Therapy Prior Inpatient Therapy: Yes Prior Therapy Dates: 2002 Prior Therapy Facilty/Provider(s): [facility] Reason for Treatment: Schizophrenia  Prior Outpatient Therapy Prior Outpatient Therapy: Yes Prior Therapy Dates: Current Prior Therapy Facilty/Provider(s): [facility] Reason for Treatment: Schizophrenia Does patient have an ACCT team?: Yes ([facility]) Does patient have Intensive In-House Services?  : No Does patient have [facility] services? : No Does patient have P4CC services?: No  ADL Screening (condition at time of admission) Patient's cognitive ability adequate to safely complete daily activities?: Yes Patient able to express need for assistance with ADLs?: Yes Independently performs ADLs?: Yes (appropriate for developmental age)       Abuse/Neglect Assessment (Assessment to be complete while patient is alone) Physical Abuse: Denies Verbal Abuse: Denies Sexual Abuse: Denies Exploitation of patient/patient's resources: Denies Self-Neglect: Denies Values / Beliefs Cultural Requests During Hospitalization: None Spiritual Requests During Hospitalization: None Consults Spiritual Care Consult Needed: No Social Work Consult Needed: No Merchant navy officer (For Healthcare) Does patient have an advance directive?: No Would patient like information on creating an advanced  directive?: Yes - Educational materials given    Additional Information 1:1 In Past 12 Months?: No CIRT Risk: No Elopement Risk: No Does patient have  medical clearance?: Yes  Child/Adolescent Assessment Running Away Risk: Denies (Patient is an adult)  Disposition:  Disposition Initial Assessment Completed for this Encounter: Yes Disposition of Patient: Other dispositions (Psych Consult ordered) Type of outpatient treatment:  (Psych Consult ordered) Other disposition(s): Other (Comment) (Psych Consult ordered)  On Site Evaluation by:   Reviewed with Physician:    Lilyan Gilford, MS, LCAS, LPC, NCC, CCSI 08/26/2014 6:07 PM

## 2014-08-26 NOTE — ED Notes (Signed)
Pt reports headache at this time and requests medication. MD notified.

## 2014-08-27 DIAGNOSIS — F203 Undifferentiated schizophrenia: Secondary | ICD-10-CM | POA: Diagnosis not present

## 2014-08-27 NOTE — ED Notes (Signed)
BEHAVIORAL HEALTH ROUNDING Patient sleeping: No. Patient alert and oriented: Yes Behavior appropriate: Yes.  ; If no, describe:  Nutrition and fluids offered: Yes  Toileting and hygiene offered: Yes  Sitter present: yes Law enforcement present: Yes ODS 

## 2014-08-27 NOTE — ED Notes (Signed)
Pt came to nursing station asking for a second tray due to still being hungry. Pt given sandwich tray from fridge.

## 2014-08-27 NOTE — ED Notes (Signed)

## 2014-08-27 NOTE — ED Notes (Signed)
Pt visualized to be sitting on the side of the bed. Pt is in no obivious distress.

## 2014-08-27 NOTE — ED Notes (Signed)
Pt leaned out of the door of the bathroom yelling for help, pt was checked on by nurse and EDT, pt appeared in NAD. Pt states she wanted someone to dress her. Pt instructed that she could dress herself. Pt comes out of bathroom and instructs staff to pick up dirty towels off of the floor. Staff instructed pt to pick up her towels and bring them to the nursing station. Pt currently in the bathroom brushing her teeth.

## 2014-08-27 NOTE — ED Notes (Signed)
Pt. Noted in room. No complaints or concerns voiced. No distress or abnormal behavior noted. Will continue to monitor with security cameras. Q 15 minute rounds continue. 

## 2014-08-27 NOTE — ED Notes (Signed)
Pt. To BHU from ED ambulatory without difficulty, to room ____7_____ . Report from ___Luis_______ RN. Pt. Is alert and oriented, warm and dry in no distress. Pt. ___denies_______  SI, HI,. Pt. Calm and cooperative. Pt. Made aware of security cameras and Q15 minute rounds. Pt. Encouraged to let Nursing staff know of any concerns or needs.

## 2014-08-27 NOTE — ED Provider Notes (Signed)
-----------------------------------------   2:57 AM on 08/27/2014 -----------------------------------------   Blood pressure 115/70, pulse 84, temperature 98 F (36.7 C), temperature source Oral, resp. rate 19, height  (1.702 m), weight 270 lb (122.471 kg), SpO2 99 %.  The patient had no acute events since last update.  Calm and cooperative at this time.  Disposition is pending per Psychiatry/Behavioral Medicine team recommendations.   Patient was moved to the behavioral health unit this morning.  Jennye Moccasin, MD 08/27/14 [phone]

## 2014-08-27 NOTE — ED Notes (Signed)
BEHAVIORAL HEALTH ROUNDING Patient sleeping: No. Patient alert and oriented: yes Behavior appropriate: Yes.  ; If no, describe:  Nutrition and fluids offered: Yes Toileting and hygiene offered: Yes  Sitter present: yes Law enforcement present: Yes ODS  

## 2014-08-27 NOTE — ED Notes (Addendum)
BEHAVIORAL HEALTH ROUNDING Patient sleeping: No. Patient alert and oriented: yes Behavior appropriate: Yes.  ; If no, describe: resting in bed quietly Nutrition and fluids offered yes Toileting and hygiene offered: yes Sitter present: yes Law enforcement present: Yes  Pt. Noted in room. No complaints or concerns voiced. No distress or abnormal behavior noted. Will continue to monitor with security cameras. Q 15 minute rounds continue.

## 2014-08-27 NOTE — ED Provider Notes (Signed)
Patient cleared for discharge by psychiatry Dr. Toni Amend.  Jene Every, MD 08/27/14 1332

## 2014-08-27 NOTE — ED Notes (Signed)
Pt visualized in NAD. Pt given breakfast tray, pt also given apple juice as she states she does not like milk. PT calm and cooperative with staff. VS taken by EDT.

## 2014-08-27 NOTE — ED Notes (Signed)
BEHAVIORAL HEALTH ROUNDING Patient sleeping: Yes.   Patient alert and oriented: Sleeping Behavior appropriate: Yes.  ; If no, describe:  Nutrition and fluids offered: Yes  Toileting and hygiene offered: Yes  Sitter present: yes Law enforcement present: Yes     

## 2014-08-27 NOTE — ED Notes (Signed)
Attempted to call group home at [phone], busy signal at this time.

## 2014-08-27 NOTE — ED Notes (Signed)
Pt walking up to window dancing and swinging her arms while laughing. Pt leans into glass and began singing. Calvin from TTS approached patient and began with her. Pt asked TTS for Tylenol.

## 2014-08-27 NOTE — ED Notes (Signed)
Patient assigned to appropriate care area. Patient oriented to unit/care area: Informed that, for their safety, care areas are designed for safety and monitored by security cameras at all times; and visiting hours explained to patient. Patient verbalizes understanding, and verbal contract for safety obtained. 

## 2014-08-27 NOTE — ED Notes (Addendum)
Pt sitting up on side bed. Pt visualized in no acute distress at this time. Pt ambulatory to the bathroom with no difficulty.

## 2014-08-27 NOTE — ED Notes (Signed)
Pt visualized in no acute distress. Pt laying in bed with the TV on at this time.

## 2014-08-27 NOTE — ED Notes (Signed)
Pt taking shower. Pt wanting someone to come and see her in the shower. I told her we knew she was in the shower and would not be opening the door. Yesterday pt stated to tech when tech walked in the room "Come on baby, do what you do best" and spread her legs. Tech drew blood and when leaving the room pt grabbed for tech wrist and said "Come here". Tech left room.

## 2014-08-27 NOTE — ED Notes (Signed)
BEHAVIORAL HEALTH ROUNDING Patient sleeping: Yes.   Patient alert and oriented: yes Behavior appropriate: Yes.  ; If no, describe: sleeping Nutrition and fluids offered: sleeping Toileting and hygiene offered: sleeping Sitter present: yes Law enforcement present: Yes

## 2014-08-27 NOTE — ED Notes (Signed)
ED BHU PLACEMENT JUSTIFICATION Is the patient under IVC or is there intent for IVC: No. Is the patient medically cleared: Yes.   Is there vacancy in the ED BHU: Yes.   Is the population mix appropriate for patient: Yes.   Is the patient awaiting placement in inpatient or outpatient setting: awaiting pysch consult Has the patient had a psychiatric consult: No. Survey of unit performed for contraband, proper placement and condition of furniture, tampering with fixtures in bathroom, shower, and each patient room: Yes.  ; Findings:  APPEARANCE/BEHAVIOR Cooperative flat affect NEURO ASSESSMENT Orientation: time, place and person Hallucinations: No.None noted (Hallucinations) Speech: Pressured Gait: normal RESPIRATORY ASSESSMENT Normal expansion.  Clear to auscultation.  No rales, rhonchi, or wheezing. CARDIOVASCULAR ASSESSMENT regular rate and rhythm, S1, S2 normal, no murmur, click, rub or gallop GASTROINTESTINAL ASSESSMENT soft, nontender, BS WNL, no r/g EXTREMITIES normal strength, tone, and muscle mass PLAN OF CARE Provide calm/safe environment. Vital signs assessed twice daily. ED BHU Assessment once each 12-hour shift. Collaborate with intake RN daily or as condition indicates. Assure the ED provider has rounded once each shift. Provide and encourage hygiene. Provide redirection as needed. Assess for escalating behavior; address immediately and inform ED provider.  Assess family dynamic and appropriateness for visitation as needed: No.; If necessary, describe findings:  Educate the patient/family about BHU procedures/visitation: Yes.  ; If necessary, describe findings:

## 2014-08-27 NOTE — ED Notes (Signed)
BEHAVIORAL HEALTH ROUNDING Patient sleeping: Yes.   Patient alert and oriented: yes Behavior appropriate: Yes.  ; If no, describe:  Nutrition and fluids offered: Yes  Toileting and hygiene offered: Yes  Sitter present: yes Law enforcement present: Yes ODS  

## 2014-08-27 NOTE — ED Notes (Signed)
ED BHU PLACEMENT JUSTIFICATION Is the patient under IVC or is there intent for IVC: No. Is the patient medically cleared: Yes.   Is there vacancy in the ED BHU: Yes.   Is the population mix appropriate for patient: Yes.   Is the patient awaiting placement in inpatient or outpatient setting: No. Has the patient had a psychiatric consult: No. Survey of unit performed for contraband, proper placement and condition of furniture, tampering with fixtures in bathroom, shower, and each patient room: Yes.  ; Findings:  APPEARANCE/BEHAVIOR calm, cooperative and adequate rapport can be established NEURO ASSESSMENT Orientation: time, place and person Hallucinations: No.None noted (Hallucinations) Pt states she was having hallucinations yesterday with the lights. Speech: Garbled Pt speech noted to be slow and garbled, pt is still able to be understood at this time.  Gait: normal RESPIRATORY ASSESSMENT Normal expansion.  Clear to auscultation.  No rales, rhonchi, or wheezing. CARDIOVASCULAR ASSESSMENT regular rate and rhythm, S1, S2 normal, no murmur, click, rub or gallop GASTROINTESTINAL ASSESSMENT soft, nontender, BS WNL, no r/g EXTREMITIES normal strength, tone, and muscle mass PLAN OF CARE Provide calm/safe environment. Vital signs assessed twice daily. ED BHU Assessment once each 12-hour shift. Collaborate with intake RN daily or as condition indicates. Assure the ED provider has rounded once each shift. Provide and encourage hygiene. Provide redirection as needed. Assess for escalating behavior; address immediately and inform ED provider.  Assess family dynamic and appropriateness for visitation as needed: Yes.  ; If necessary, describe findings:  Educate the patient/family about BHU procedures/visitation: Yes.  ; If necessary, describe findings:

## 2014-08-27 NOTE — Discharge Instructions (Signed)
Schizophrenia °Schizophrenia is a mental illness. It may cause disturbed or disorganized thinking, speech, or behavior. People with schizophrenia have problems functioning in one or more areas of life: work, school, home, or relationships. People with schizophrenia are at increased risk for suicide, certain chronic physical illnesses, and unhealthy behaviors, such as smoking and drug use. °People who have family members with schizophrenia are at higher risk of developing the illness. Schizophrenia affects men and women equally but usually appears at an earlier age (teenage or early adult years) in men.  °SYMPTOMS °The earliest symptoms are often subtle (prodrome) and may go unnoticed until the illness becomes more severe (first-break psychosis). Symptoms of schizophrenia may be continuous or may come and go in severity. Episodes often are triggered by major life events, such as family stress, college, military service, marriage, pregnancy or child birth, divorce, or loss of a loved one. People with schizophrenia may see, hear, or feel things that do not exist (hallucinations). They may have false beliefs in spite of obvious proof to the contrary (delusions). Sometimes speech is incoherent or behavior is odd or withdrawn.  °DIAGNOSIS °Schizophrenia is diagnosed through an assessment by your caregiver. Your caregiver will ask questions about your thoughts, behavior, mood, and ability to function in daily life. Your caregiver may ask questions about your medical history and use of alcohol or drugs, including prescription medication. Your caregiver may also order blood tests and imaging exams. Certain medical conditions and substances can cause symptoms that resemble schizophrenia. Your caregiver may refer you to a mental health specialist for evaluation. There are three major criterion for a diagnosis of schizophrenia: °· Two or more of the following five symptoms are present for a month or longer: °¨ Delusions. Often  the delusions are that you are being attacked, harassed, cheated, persecuted or conspired against (persecutory delusions). °¨ Hallucinations.   °¨ Disorganized speech that does not make sense to others. °¨ Grossly disorganized (confused or unfocused) behavior or extremely overactive or underactive motor activity (catatonia). °¨ Negative symptoms such as bland or blunted emotions (flat affect), loss of will power (avolition), and withdrawal from social contacts (social isolation). °· Level of functioning in one or more major areas of life (work, school, relationships, or self-care) is markedly below the level of functioning before the onset of illness.   °· There are continuous signs of illness (either mild symptoms or decreased level of functioning) for at least 6 months or longer. °TREATMENT  °Schizophrenia is a long-term illness. It is best controlled with continuous treatment rather than treatment only when symptoms occur. The following treatments are used to manage schizophrenia: °· Medication--Medication is the most effective and important form of treatment for schizophrenia. Antipsychotic medications are usually prescribed to help manage schizophrenia. Other types of medication may be added to relieve any symptoms that may occur despite the use of antipsychotic medications. °· Counseling or talk therapy--Individual, group, or family counseling may be helpful in providing education, support, and guidance. Many people with schizophrenia also benefit from social skills and job skills (vocational) training. °A combination of medication and counseling is best for managing the disorder over time. A procedure in which electricity is applied to the brain through the scalp (electroconvulsive therapy) may be used to treat catatonic schizophrenia or schizophrenia in people who cannot take or do not respond to medication and counseling. °Document Released: 12/17/1999 Document Revised: 08/21/2012 Document Reviewed:  03/13/2012 °[employer]® Patient Information ©2015 [employer]. This information is not intended to replace advice given to you by   your health care provider. Make sure you discuss any questions you have with your health care provider. ° °

## 2014-08-27 NOTE — ED Notes (Signed)
Pt visualized in NAD. Respirations even and unlabored. Pt laying in bed watching TV.

## 2014-08-27 NOTE — ED Notes (Signed)
BEHAVIORAL HEALTH ROUNDING Patient sleeping: No. Patient alert and oriented: yes Behavior appropriate: No.; If no, describe:  Nutrition and fluids offered: Yes  Toileting and hygiene offered: Yes  Sitter present: yes Law enforcement present: Yes ODS 

## 2014-08-27 NOTE — ED Notes (Signed)
BEHAVIORAL HEALTH ROUNDING Patient sleeping: No. Patient alert and oriented: yes Behavior appropriate: Yes.  ; If no, describe:  Nutrition and fluids offered: Yes  Toileting and hygiene offered: Yes  Sitter present: yes Law enforcement present: Yes  

## 2014-08-27 NOTE — ED Provider Notes (Signed)
Please note this is a late entry into the medical record due to computer system down time at the time of initial patient evaluation  [facility] Emergency Department Provider Note  ____________________________________________  Time seen: 08/26/2014 at 2:00 PM  I have reviewed the triage vital signs and the nursing notes.   HISTORY  Chief Complaint Psychiatric Evaluation    HPI Amy Villarreal is a 49 y.o. female who reports that she had a "tantrum" at her group home today. She said that she was feeling upset and started throwing things, including throwing a chair and an Estrace, breaking a window, spilling a cup of water. She denies any homicidal or suicidal ideation. Denies any hallucinations or paranoia. No other acute symptoms. She reports compliance with her medications as far she knows.She currently feels calm her and safe in the ED.     Past Medical History  Diagnosis Date  . Anxiety   . Hypertension   . Gout   . GERD (gastroesophageal reflux disease)   . Asthma   . DJD (degenerative joint disease)   . Osteoporosis   . Obesity   . Constipation   . Urinary incontinence   . Schizoaffective disorder     Patient Active Problem List   Diagnosis Date Noted  . Schizophrenia 06/26/2014    Past Surgical History  Procedure Laterality Date  . Abdominal hysterectomy      Current Outpatient Rx  Name  Route  Sig  Dispense  Refill  . allopurinol (ZYLOPRIM) 100 MG tablet   Oral   Take 100 mg by mouth 2 (two) times daily.         Marland Kitchen aspirin EC 81 MG tablet   Oral   Take 81 mg by mouth daily.         . bisacodyl (DULCOLAX) 10 MG suppository   Rectal   Place 10 mg rectally as needed for moderate constipation.         . cloZAPine (CLOZARIL) 100 MG tablet   Oral   Take 300 mg by mouth at bedtime.          Marland Kitchen glipiZIDE (GLUCOTROL) 10 MG tablet   Oral   Take 10 mg by mouth daily.          Marland Kitchen lisinopril (PRINIVIL,ZESTRIL) 10 MG  tablet   Oral   Take 10 mg by mouth daily.         Marland Kitchen LORazepam (ATIVAN) 0.5 MG tablet   Oral   Take 0.5 mg by mouth 2 (two) times daily.         . magnesium hydroxide (MILK OF MAGNESIA) 400 MG/5ML suspension   Oral   Take 30 mLs by mouth as needed for mild constipation.         . meloxicam (MOBIC) 7.5 MG tablet   Oral   Take 7.5 mg by mouth daily.         Marland Kitchen omeprazole (PRILOSEC) 20 MG capsule   Oral   Take 20 mg by mouth daily.         . risperiDONE (RISPERDAL) 3 MG tablet   Oral   Take 3 mg by mouth at bedtime.         . senna-docusate (SENOKOT-S) 8.6-50 MG per tablet   Oral   Take 1 tablet by mouth 2 (two) times daily.           Allergies Review of patient's allergies indicates no known allergies.  No family history on file.  Social History Social History  Substance Use Topics  . Smoking status: Current Every Day Smoker    Types: Cigarettes  . Smokeless tobacco: None  . Alcohol Use: No    Review of Systems  Constitutional: No fever or chills. No weight changes Eyes:No blurry vision or double vision.  ENT: No sore throat. Cardiovascular: No chest pain. Respiratory: No dyspnea or cough. Gastrointestinal: Negative for abdominal pain, vomiting and diarrhea.  No BRBPR or melena. Genitourinary: Negative for dysuria, urinary retention, bloody urine, or difficulty urinating. Musculoskeletal: Negative for back pain. No joint swelling or pain. Skin: Negative for rash. Neurological: Negative for headaches, focal weakness or numbness. Psychiatric:Agitation   Endocrine:No hot/cold intolerance, changes in energy, or sleep difficulty.  10-point ROS otherwise negative.  ____________________________________________   PHYSICAL EXAM:  VITAL SIGNS: ED Triage Vitals  Enc Vitals Group     BP 08/26/14 1349 112/72 mmHg     Pulse Rate 08/26/14 1349 88     Resp 08/26/14 1349 20     Temp 08/26/14 1349 98 F (36.7 C)     Temp Source 08/26/14 1349 Oral      SpO2 08/26/14 1349 98 %     Weight 08/26/14 1349 270 lb (122.471 kg)     Height 08/26/14 1349 5\' 7"  (1.702 m)     Head Cir --      Peak Flow --      Pain Score --      Pain Loc --      Pain Edu? --      Excl. in GC? --      Constitutional: Alert . Well appearing and in no distress. Eyes: No scleral icterus. No conjunctival pallor. PERRL. EOMI ENT   Head: Normocephalic and atraumatic.   Nose: No congestion/rhinnorhea. No septal hematoma   Mouth/Throat: MMM, no pharyngeal erythema. No peritonsillar mass. No uvula shift.   Neck: No stridor. No SubQ emphysema. No meningismus. Hematological/Lymphatic/Immunilogical: No cervical lymphadenopathy. Cardiovascular: RRR. Normal and symmetric distal pulses are present in all extremities. No murmurs, rubs, or gallops. Respiratory: Normal respiratory effort without tachypnea nor retractions. Breath sounds are clear and equal bilaterally. No wheezes/rales/rhonchi. Gastrointestinal: Soft and nontender. No distention. There is no CVA tenderness.  No rebound, rigidity, or guarding. Genitourinary: deferred Musculoskeletal: Nontender with normal range of motion in all extremities. No joint effusions.  No lower extremity tenderness.  No edema. Neurologic:   Normal speech and language.  CN 2-10 normal. Motor grossly intact. No pronator drift.  Normal gait. No gross focal neurologic deficits are appreciated.  Skin:  Skin is warm, dry and intact. No rash noted.  No petechiae, purpura, or bullae. Psychiatric: Mood and affect are normal. Speech and behavior are normal. Patient exhibits appropriate insight and judgment.  ____________________________________________    LABS (pertinent positives/negatives) (all labs ordered are listed, but only abnormal results are displayed) Labs Reviewed  BASIC METABOLIC PANEL - Abnormal; Notable for the following:    Potassium 3.4 (*)    Glucose, Bld 100 (*)    All other components within normal  limits  ACETAMINOPHEN LEVEL - Abnormal; Notable for the following:    Acetaminophen (Tylenol), Serum <10 (*)    All other components within normal limits  CBC WITH DIFFERENTIAL/PLATELET - Abnormal; Notable for the following:    WBC 12.1 (*)    Neutro Abs 7.1 (*)    Lymphs Abs 3.7 (*)    All other components within normal limits  URINALYSIS COMPLETEWITH MICROSCOPIC ([facility] ONLY) - Abnormal; Notable  for the following:    Color, Urine YELLOW (*)    APPearance HAZY (*)    Squamous Epithelial / LPF 6-30 (*)    All other components within normal limits  SALICYLATE LEVEL  URINE DRUG SCREEN, QUALITATIVE ([facility] ONLY)   ____________________________________________   EKG    ____________________________________________    RADIOLOGY    ____________________________________________   PROCEDURES  ____________________________________________   INITIAL IMPRESSION / ASSESSMENT AND PLAN / ED COURSE  Pertinent labs & imaging results that were available during my care of the patient were reviewed by me and considered in my medical decision making (see chart for details).  Patient presents with episode of agitation at the group home. She is now calm and denies any acute complaints. She appears to be at her baseline. No criteria for commitment at this time as she does not appear to be a danger to herself or others. We will obtain a psychiatry consult for further evaluation.  ____________________________________________   FINAL CLINICAL IMPRESSION(S) / ED DIAGNOSES  Final diagnoses:  Disorganized schizophrenia      Sharman Cheek, MD 08/27/14 [id number]

## 2014-08-27 NOTE — ED Notes (Signed)
Notified Group home of pt's D/C. States they are on the way to pick him up.

## 2014-08-27 NOTE — Consult Note (Signed)
[facility] Consult   Reason for Consult:  Consult for this 49 year old woman with a history of schizophrenia and probably some degree of developmental disability who was brought to the hospital after destroying some property at her group home Referring Physician:  Corky Downs Patient Identification: Amy Villarreal MRN:  [id number] Principal Diagnosis: Schizophrenia Diagnosis:   Patient Active Problem List   Diagnosis Date Noted  . Schizophrenia [F20.9] 06/26/2014    Total Time spent with patient: 45 minutes  Subjective:   Amy Villarreal is a 49 y.o. female patient admitted with "I told them that I might break something. I'm sorry".  HPI:  Information from the patient and the chart. This a 49 year old woman with a history of schizophrenia possible developmental disability multiple medical problems. She was at her group home yesterday. According to the patient she saw a light fixture that was flickering and that made her upset and felt like she was going to lose it. She can't come up with anything more rational than that. She says that she begged the staff to help her but they couldn't get her to calm down and she broke a window. She totally denies that she was trying to hurt anyone or hurt herself and expresses regret. Collateral from the staff is that the patient was actually frustrated that she was not getting the food that she wanted and that was why she lost her temper. Nevertheless they do not feel like she is a danger personally to anyone there. She has apparently been compliant with her medicine. No sign of substance abuse.  Past psychiatric history: Patient has a well-established history of schizophrenia. Currently maintained on clozapine. Hasn't act team. Has had hospitalizations in the past I don't believe she's had suicidal behavior in the past. When she gets agitated she usually breaks property rather than getting physically aggressive with other people.  Substance abuse  history: Patient denies any substance abuse history I'm not aware of any at least not active.  Social history: Patient has an active team. Lives at a group home. Sounds like there pretty supportive of her father letting her come back after this.  Medical history: Patient has gout, gastric reflux symptoms high blood pressure mild diabetes  Family history: Nonidentified HPI Elements:   Quality:  Agitation and breaking some property. Severity:  Moderate. Timing:  Sounds like it was pretty acute and quickly resolved. Duration:  Already resolved at this point. Context:  Just her chronic schizophrenia and behavior.  Past Medical History:  Past Medical History  Diagnosis Date  . Anxiety   . Hypertension   . Gout   . GERD (gastroesophageal reflux disease)   . Asthma   . DJD (degenerative joint disease)   . Osteoporosis   . Obesity   . Constipation   . Urinary incontinence   . Schizoaffective disorder     Past Surgical History  Procedure Laterality Date  . Abdominal hysterectomy     Family History: No family history on file. Social History:  History  Alcohol Use No     History  Drug Use No    Social History   Social History  . Marital Status: Single    Spouse Name: N/A  . Number of Children: N/A  . Years of Education: N/A   Social History Main Topics  . Smoking status: Current Every Day Smoker    Types: Cigarettes  . Smokeless tobacco: None  . Alcohol Use: No  . Drug Use: No  . Sexual  Activity: Not Asked   Other Topics Concern  . None   Social History Narrative   Additional Social History:    Pain Medications: None Reported Prescriptions: None Reported Over the Counter: None Reported History of alcohol / drug use?: No history of alcohol / drug abuse Longest period of sobriety (when/how long): None Reported Negative Consequences of Use:  (None Reported) Withdrawal Symptoms:  (None Reported)                     Allergies:  No Known  Allergies  Labs:  Results for orders placed or performed during the hospital encounter of 08/26/14 (from the past 48 hour(s))  Basic metabolic panel     Status: Abnormal   Collection Time: 08/26/14  1:56 PM  Result Value Ref Range   Sodium 139 135 - 145 mmol/L   Potassium 3.4 (L) 3.5 - 5.1 mmol/L   Chloride 105 101 - 111 mmol/L   CO2 27 22 - 32 mmol/L   Glucose, Bld 100 (H) 65 - 99 mg/dL   BUN 10 6 - 20 mg/dL   Creatinine, Ser 0.67 0.44 - 1.00 mg/dL   Calcium 9.6 8.9 - 10.3 mg/dL   GFR calc non Af Amer >60 >60 mL/min   GFR calc Af Amer >60 >60 mL/min    Comment: (NOTE) The eGFR has been calculated using the CKD EPI equation. This calculation has not been validated in all clinical situations. eGFR's persistently <60 mL/min signify possible Chronic Kidney Disease.    Anion gap 7 5 - 15  Salicylate level     Status: None   Collection Time: 08/26/14  1:56 PM  Result Value Ref Range   Salicylate Lvl <6.7 2.8 - 30.0 mg/dL  Acetaminophen level     Status: Abnormal   Collection Time: 08/26/14  1:56 PM  Result Value Ref Range   Acetaminophen (Tylenol), Serum <10 (L) 10 - 30 ug/mL    Comment:        THERAPEUTIC CONCENTRATIONS VARY SIGNIFICANTLY. A RANGE OF 10-30 ug/mL MAY BE AN EFFECTIVE CONCENTRATION FOR MANY PATIENTS. HOWEVER, SOME ARE BEST TREATED AT CONCENTRATIONS OUTSIDE THIS RANGE. ACETAMINOPHEN CONCENTRATIONS >150 ug/mL AT 4 HOURS AFTER INGESTION AND >50 ug/mL AT 12 HOURS AFTER INGESTION ARE OFTEN ASSOCIATED WITH TOXIC REACTIONS.   CBC with Differential     Status: Abnormal   Collection Time: 08/26/14  1:56 PM  Result Value Ref Range   WBC 12.1 (H) 3.6 - 11.0 K/uL   RBC 4.33 3.80 - 5.20 MIL/uL   Hemoglobin 12.3 12.0 - 16.0 g/dL   HCT 37.4 35.0 - 47.0 %   MCV 86.4 80.0 - 100.0 fL   MCH 28.5 26.0 - 34.0 pg   MCHC 32.9 32.0 - 36.0 g/dL   RDW 13.9 11.5 - 14.5 %   Platelets 362 150 - 440 K/uL   Neutrophils Relative % 58 %   Neutro Abs 7.1 (H) 1.4 - 6.5 K/uL    Lymphocytes Relative 31 %   Lymphs Abs 3.7 (H) 1.0 - 3.6 K/uL   Monocytes Relative 8 %   Monocytes Absolute 0.9 0.2 - 0.9 K/uL   Eosinophils Relative 2 %   Eosinophils Absolute 0.2 0 - 0.7 K/uL   Basophils Relative 1 %   Basophils Absolute 0.1 0 - 0.1 K/uL  Urinalysis complete, with microscopic     Status: Abnormal   Collection Time: 08/26/14  1:56 PM  Result Value Ref Range   Color, Urine YELLOW (A) YELLOW  APPearance HAZY (A) CLEAR   Glucose, UA NEGATIVE NEGATIVE mg/dL   Bilirubin Urine NEGATIVE NEGATIVE   Ketones, ur NEGATIVE NEGATIVE mg/dL   Specific Gravity, Urine 1.011 1.005 - 1.030   Hgb urine dipstick NEGATIVE NEGATIVE   pH 6.0 5.0 - 8.0   Protein, ur NEGATIVE NEGATIVE mg/dL   Nitrite NEGATIVE NEGATIVE   Leukocytes, UA NEGATIVE NEGATIVE   RBC / HPF NONE SEEN 0 - 5 RBC/hpf   WBC, UA 0-5 0 - 5 WBC/hpf   Bacteria, UA NONE SEEN NONE SEEN   Squamous Epithelial / LPF 6-30 (A) NONE SEEN   Mucous PRESENT   Urine Drug Screen, Qualitative     Status: None   Collection Time: 08/26/14  1:56 PM  Result Value Ref Range   Tricyclic, Ur Screen NONE DETECTED NONE DETECTED   Amphetamines, Ur Screen NONE DETECTED NONE DETECTED   MDMA (Ecstasy)Ur Screen NONE DETECTED NONE DETECTED   Cocaine Metabolite,Ur [place] NONE DETECTED NONE DETECTED   Opiate, Ur Screen NONE DETECTED NONE DETECTED   Phencyclidine (PCP) Ur S NONE DETECTED NONE DETECTED   Cannabinoid 50 Ng, Ur [place] NONE DETECTED NONE DETECTED   Barbiturates, Ur Screen NONE DETECTED NONE DETECTED   Benzodiazepine, Ur Scrn NONE DETECTED NONE DETECTED   Methadone Scn, Ur NONE DETECTED NONE DETECTED    Comment: (NOTE) 440  Tricyclics, urine               Cutoff 1000 ng/mL 200  Amphetamines, urine             Cutoff 1000 ng/mL 300  MDMA (Ecstasy), urine           Cutoff 500 ng/mL 400  Cocaine Metabolite, urine       Cutoff 300 ng/mL 500  Opiate, urine                   Cutoff 300 ng/mL 600  Phencyclidine (PCP), urine      Cutoff 25  ng/mL 700  Cannabinoid, urine              Cutoff 50 ng/mL 800  Barbiturates, urine             Cutoff 200 ng/mL 900  Benzodiazepine, urine           Cutoff 200 ng/mL 1000 Methadone, urine                Cutoff 300 ng/mL 1100 1200 The urine drug screen provides only a preliminary, unconfirmed 1300 analytical test result and should not be used for non-medical 1400 purposes. Clinical consideration and professional judgment should 1500 be applied to any positive drug screen result due to possible 1600 interfering substances. A more specific alternate chemical method 1700 must be used in order to obtain a confirmed analytical result.  1800 Gas chromato graphy / mass spectrometry (GC/MS) is the preferred 1900 confirmatory method.     Vitals: Blood pressure 129/83, pulse 95, temperature 97.4 F (36.3 C), temperature source Oral, resp. rate 20, height 5' 7"  (1.702 m), weight 122.471 kg (270 lb), SpO2 100 %.  Risk to Self: Suicidal Ideation: No Suicidal Intent: No Is patient at risk for suicide?: No Suicidal Plan?: No Access to Means: No What has been your use of drugs/alcohol within the last 12 months?: None Reported How many times?: 0 Other Self Harm Risks: 0 Triggers for Past Attempts: None known Intentional Self Injurious Behavior: None Risk to Others: Homicidal Ideation: No Thoughts of Harm to Others: No  Current Homicidal Intent: No Current Homicidal Plan: No Access to Homicidal Means: No Identified Victim: None Reported History of harm to others?: No Assessment of Violence: In distant past Violent Behavior Description: Break items at the [place] Does patient have access to weapons?: No Criminal Charges Pending?: No Does patient have a court date: No Prior Inpatient Therapy: Prior Inpatient Therapy: Yes Prior Therapy Dates: 2002 Prior Therapy Facilty/Provider(s): [facility] Reason for Treatment: Schizophrenia Prior Outpatient Therapy: Prior Outpatient Therapy: Yes Prior  Therapy Dates: Current Prior Therapy Facilty/Provider(s): [facility] Reason for Treatment: Schizophrenia Does patient have an ACCT team?: Yes ([facility]) Does patient have Intensive In-House Services?  : No Does patient have [facility] services? : No Does patient have P4CC services?: No  No current facility-administered medications for this encounter.   Current Outpatient Prescriptions  Medication Sig Dispense Refill  . allopurinol (ZYLOPRIM) 100 MG tablet Take 100 mg by mouth 2 (two) times daily.    Marland Kitchen aspirin EC 81 MG tablet Take 81 mg by mouth daily.    . bisacodyl (DULCOLAX) 10 MG suppository Place 10 mg rectally as needed for moderate constipation.    . cloZAPine (CLOZARIL) 100 MG tablet Take 300 mg by mouth at bedtime.     Marland Kitchen glipiZIDE (GLUCOTROL) 10 MG tablet Take 10 mg by mouth daily.     Marland Kitchen lisinopril (PRINIVIL,ZESTRIL) 10 MG tablet Take 10 mg by mouth daily.    Marland Kitchen LORazepam (ATIVAN) 0.5 MG tablet Take 0.5 mg by mouth 2 (two) times daily.    . magnesium hydroxide (MILK OF MAGNESIA) 400 MG/5ML suspension Take 30 mLs by mouth as needed for mild constipation.    . meloxicam (MOBIC) 7.5 MG tablet Take 7.5 mg by mouth daily.    Marland Kitchen omeprazole (PRILOSEC) 20 MG capsule Take 20 mg by mouth daily.    . risperiDONE (RISPERDAL) 3 MG tablet Take 3 mg by mouth at bedtime.    . senna-docusate (SENOKOT-S) 8.6-50 MG per tablet Take 1 tablet by mouth 2 (two) times daily.      Musculoskeletal: Strength & Muscle Tone: within normal limits Gait & Station: normal Patient leans: N/A  Psychiatric Specialty Exam: Physical Exam  Nursing note and vitals reviewed. Constitutional: She appears well-developed and well-nourished.  HENT:  Head: Normocephalic and atraumatic.  Eyes: Conjunctivae are normal. Pupils are equal, round, and reactive to light.  Neck: Normal range of motion.  Cardiovascular: Normal heart sounds.   Respiratory: Effort normal.  GI: Soft.  Musculoskeletal: Normal range of motion.   Neurological: She is alert.  Skin: Skin is warm and dry.  Psychiatric: She has a normal mood and affect. Her speech is normal and behavior is normal. Thought content normal. Cognition and memory are normal. She expresses impulsivity.    Review of Systems  Constitutional: Negative.   HENT: Negative.   Eyes: Negative.   Respiratory: Negative.   Cardiovascular: Negative.   Gastrointestinal: Negative.   Musculoskeletal: Negative.   Skin: Negative.   Neurological: Negative.   Psychiatric/Behavioral: Negative for depression, suicidal ideas, hallucinations and substance abuse. The patient is not nervous/anxious and does not have insomnia.     Blood pressure 129/83, pulse 95, temperature 97.4 F (36.3 C), temperature source Oral, resp. rate 20, height 5' 7"  (1.702 m), weight 122.471 kg (270 lb), SpO2 100 %.Body mass index is 42.28 kg/(m^2).  General Appearance: Casual  Eye Contact::  Good  Speech:  Clear and Coherent  Volume:  Normal  Mood:  Anxious  Affect:  Appropriate  Thought Process:  Circumstantial  Orientation:  Full (Time, Place, and Person)  Thought Content:  Negative  Suicidal Thoughts:  No  Homicidal Thoughts:  No  Memory:  Immediate;   Fair Recent;   Fair Remote;   Good  Judgement:  Fair  Insight:  Fair  Psychomotor Activity:  Normal  Concentration:  Fair  Recall:  [employer] [place]  Language: Fair  Akathisia:  No  Handed:  Right  AIMS (if indicated):     Assets:  Communication Skills Desire for Improvement Physical Health Resilience Social Support  ADL's:  Intact  Cognition: Impaired,  Mild  Sleep:      Medical Decision Making: Review or order clinical lab tests (1), Established Problem, Worsening (2), Review of Medication Regimen & Side Effects (2) and Review of New Medication or Change in Dosage (2)  Treatment Plan Summary: Medication management and Plan [facility] group [facility] is [facility] expressed a willingness to take her back. She appears to be  at her baseline mental state. Not expressing any homicidal or suicidal ideation. Cooperative with treatment here in the hospital. No indication for hospitalization. I spent some time talking with her about her need to work on controlling her temper and her outburst better. She agrees that she will talk with her act team about that. She is not under IVC. Case discussed with emergency room doctor. No new prescriptions. She can be discharged home. Labs reviewed.  Plan:  Patient does not meet criteria for psychiatric inpatient admission. Discussed crisis plan, support from social network, calling 911, coming to the Emergency Department, and calling Suicide Hotline. Disposition: Discharge back to her group home  Alethia Berthold 08/27/2014 4:33 PM

## 2014-08-27 NOTE — ED Notes (Signed)
NAD noted at time of D/C. Pt ambulatory to the lobby at this time. Pt denies comments/concerns at this time. 

## 2014-08-27 NOTE — ED Notes (Signed)
Pt visualized in NAD. Pt given shower by EDT. Pt calm and cooperative at this time. Pt ambulated over to shower area.

## 2014-09-19 ENCOUNTER — Encounter: Payer: Self-pay | Admitting: Emergency Medicine

## 2014-09-19 ENCOUNTER — Emergency Department
Admission: EM | Admit: 2014-09-19 | Discharge: 2014-09-20 | Disposition: A | Discharge status: Home / Self Care | Payer: Medicare Other | Attending: Emergency Medicine | Admitting: Emergency Medicine

## 2014-09-19 DIAGNOSIS — R451 Restlessness and agitation: Secondary | ICD-10-CM | POA: Diagnosis present

## 2014-09-19 DIAGNOSIS — Z720 Tobacco use: Secondary | ICD-10-CM | POA: Insufficient documentation

## 2014-09-19 DIAGNOSIS — I10 Essential (primary) hypertension: Secondary | ICD-10-CM | POA: Diagnosis not present

## 2014-09-19 DIAGNOSIS — Z7982 Long term (current) use of aspirin: Secondary | ICD-10-CM | POA: Insufficient documentation

## 2014-09-19 DIAGNOSIS — Z79899 Other long term (current) drug therapy: Secondary | ICD-10-CM | POA: Insufficient documentation

## 2014-09-19 LAB — CBC WITH DIFFERENTIAL/PLATELET
Basophils Absolute: 0.1 10*3/uL (ref 0–0.1)
Basophils Relative: 1 %
Eosinophils Absolute: 0.2 10*3/uL (ref 0–0.7)
Eosinophils Relative: 1 %
HEMATOCRIT: 38.4 % (ref 35.0–47.0)
HEMOGLOBIN: 12.7 g/dL (ref 12.0–16.0)
LYMPHS ABS: 4.9 10*3/uL — AB (ref 1.0–3.6)
Lymphocytes Relative: 30 %
MCH: 28.9 pg (ref 26.0–34.0)
MCHC: 33 g/dL (ref 32.0–36.0)
MCV: 87.6 fL (ref 80.0–100.0)
MONO ABS: 0.9 10*3/uL (ref 0.2–0.9)
MONOS PCT: 5 %
NEUTROS ABS: 10.3 10*3/uL — AB (ref 1.4–6.5)
NEUTROS PCT: 63 %
Platelets: 367 10*3/uL (ref 150–440)
RBC: 4.39 MIL/uL (ref 3.80–5.20)
RDW: 13.9 % (ref 11.5–14.5)
WBC: 16.3 10*3/uL — ABNORMAL HIGH (ref 3.6–11.0)

## 2014-09-19 LAB — COMPREHENSIVE METABOLIC PANEL
ALK PHOS: 82 U/L (ref 38–126)
ALT: 12 U/L — ABNORMAL LOW (ref 14–54)
ANION GAP: 9 (ref 5–15)
AST: 22 U/L (ref 15–41)
Albumin: 4.6 g/dL (ref 3.5–5.0)
BILIRUBIN TOTAL: 0.4 mg/dL (ref 0.3–1.2)
BUN: 11 mg/dL (ref 6–20)
CALCIUM: 9.7 mg/dL (ref 8.9–10.3)
CO2: 25 mmol/L (ref 22–32)
Chloride: 103 mmol/L (ref 101–111)
Creatinine, Ser: 0.63 mg/dL (ref 0.44–1.00)
Glucose, Bld: 128 mg/dL — ABNORMAL HIGH (ref 65–99)
POTASSIUM: 3.4 mmol/L — AB (ref 3.5–5.1)
Sodium: 137 mmol/L (ref 135–145)
TOTAL PROTEIN: 8.7 g/dL — AB (ref 6.5–8.1)

## 2014-09-19 LAB — URINALYSIS COMPLETE WITH MICROSCOPIC (ARMC ONLY)
BILIRUBIN URINE: NEGATIVE
GLUCOSE, UA: NEGATIVE mg/dL
HGB URINE DIPSTICK: NEGATIVE
KETONES UR: NEGATIVE mg/dL
NITRITE: NEGATIVE
Protein, ur: NEGATIVE mg/dL
SPECIFIC GRAVITY, URINE: 1.016 (ref 1.005–1.030)
pH: 5 (ref 5.0–8.0)

## 2014-09-19 LAB — URINE DRUG SCREEN, QUALITATIVE (ARMC ONLY)
Amphetamines, Ur Screen: NOT DETECTED
BARBITURATES, UR SCREEN: NOT DETECTED
Benzodiazepine, Ur Scrn: POSITIVE — AB
COCAINE METABOLITE, UR ~~LOC~~: NOT DETECTED
Cannabinoid 50 Ng, Ur ~~LOC~~: NOT DETECTED
MDMA (Ecstasy)Ur Screen: NOT DETECTED
Methadone Scn, Ur: NOT DETECTED
OPIATE, UR SCREEN: NOT DETECTED
PHENCYCLIDINE (PCP) UR S: NOT DETECTED
Tricyclic, Ur Screen: NOT DETECTED

## 2014-09-19 LAB — ETHANOL: Alcohol, Ethyl (B): <5 mg/dL (ref <5)

## 2014-09-19 NOTE — ED Notes (Signed)
BEHAVIORAL HEALTH ROUNDING Patient sleeping: No. Patient alert and oriented: yes Behavior appropriate: Yes.  ; If no, describe:  Nutrition and fluids offered: Yes  Toileting and hygiene offered: Yes  Sitter present: not applicable Law enforcement present: Yes  

## 2014-09-19 NOTE — BH Assessment (Signed)
Assessment Note  Amy Villarreal is an 49 y.o. female. Pt is poor historian and presents with impaired insight and judgment. Pt. states that she was "seeing lights" but, "I feel better now". Pt reports no HI, SI or self-injurious behaviors. Pt. is requested medicine and that writer contact Group Home so that she may leave before its 'not too late".  The following information was obtained from Pt's chart:  Pt is a resident of [facility] [phone]). Pt was previously in ED (8.24.16) due to "the light start flickering on and off. I told them, it was bothering me, I told them, I needed help. Next I know I start breaking things. I broke the window; I broke the chair. I don't know. I need help..." Pt reported no SI/HI during that encounter. At time of previous encounter Pt was recipient of ACTT services from [facility]  Pt's chart also notes hx of schizophrenia and inpatient therapy at [facility]  _____ Writer attempted unsuccessfully to contact group home. Writer consulted with EDP and Pt is to be discharged to group home upon contact.  Axis I: See current hospital problem list  Past Medical History:  Past Medical History  Diagnosis Date  . Anxiety   . Hypertension   . Gout   . GERD (gastroesophageal reflux disease)   . Asthma   . DJD (degenerative joint disease)   . Osteoporosis   . Obesity   . Constipation   . Urinary incontinence   . Schizoaffective disorder     Past Surgical History  Procedure Laterality Date  . Abdominal hysterectomy      Family History: History reviewed. No pertinent family history.  Social History:  reports that she has been smoking Cigarettes.  She does not have any smokeless tobacco history on file. She reports that she drinks alcohol. She reports that she does not use illicit drugs.  Additional Social History:  Alcohol / Drug Use Pain Medications: UTA Prescriptions: UTA Over the Counter: UTA History of alcohol / drug use?: No history of  alcohol / drug abuse (Per Chart) Longest period of sobriety (when/how long): UTA  CIWA: CIWA-Ar BP: 110/78 mmHg Pulse Rate: 74 COWS:    Allergies: No Known Allergies  Home Medications:  (Not in a hospital admission)  OB/GYN Status:  No LMP recorded. Patient has had a hysterectomy.  General Assessment Data Location of Assessment: [facility] ED TTS Assessment: In system Is this a Tele or Face-to-Face Assessment?: Face-to-Face Is this an Initial Assessment or a Re-assessment for this encounter?: Initial Assessment Marital status: Single (UTA, Denied Per Pt Chart) Maiden name: N/A Is patient pregnant?: Unknown Pregnancy Status: Unknown Living Arrangements: Group Home ([phone])) Can pt return to current living arrangement?:  (Unknown, unsucessful contact attempted) Admission Status: Voluntary Referral Source: Self/Family/Friend Insurance type: Medicare (Per Chart)  Medical Screening Exam [facility] Walk-[facility] ONLY) Medical Exam completed: Yes  Crisis Care Plan Living Arrangements: Group Home ([phone])) Name of Psychiatrist: Dr.Lateef (UTA, Info obtained from chart) Name of Therapist: [facility] (UTA, Info obtained from chart)  Education Status Is patient currently in school?: No (UTA, Info obtained from chart) Current Grade:  (N/A) Highest grade of school patient has completed: 12th (UTA, Info obtained from chart) Name of school: N/A Contact person: N/A  Risk to self with the past 6 months Suicidal Ideation: No Has patient been a risk to self within the past 6 months prior to admission? : No Suicidal Intent: No Has patient  had any suicidal intent within the past 6 months prior to admission? : No Is patient at risk for suicide?: No Suicidal Plan?: No Has patient had any suicidal plan within the past 6 months prior to admission? : No Access to Means: No What has been your use of drugs/alcohol within the last 12 months?: None  Reported Previous Attempts/Gestures: No How many times?: 0 Other Self Harm Risks: None Reported Intentional Self Injurious Behavior: None Family Suicide History: Unable to assess Recent stressful life event(s):  (UTA) Persecutory voices/beliefs?: No (UTA, Info obtained from chart) Depression Symptoms:  (UTA) Substance abuse history and/or treatment for substance abuse?: No (UTA, Info obtained from chart) Suicide prevention information given to non-admitted patients: Not applicable  Risk to Others within the past 6 months Homicidal Ideation: No Does patient have any lifetime risk of violence toward others beyond the six months prior to admission? : No Thoughts of Harm to Others: No Current Homicidal Intent: No Current Homicidal Plan: No Access to Homicidal Means: No Identified Victim: N/A History of harm to others?: No Assessment of Violence: In distant past (UTA, Info obtained from chart) Violent Behavior Description: Break items at the Group Home (UTA, Info obtained from chart) Does patient have access to weapons?: No Criminal Charges Pending?: No Does patient have a court date: No Is patient on probation?: No  Psychosis Hallucinations: None noted Delusions:  (UTA)  Mental Status Report Appearance/Hygiene: In scrubs Eye Contact: Fair Motor Activity: Restlessness Speech: Incoherent Level of Consciousness: Alert Mood: Pleasant Affect: Appropriate to circumstance Anxiety Level: Minimal Thought Processes: Relevant Judgement: Unable to Assess Orientation: Place, Situation, Time, Person Obsessive Compulsive Thoughts/Behaviors: None  Cognitive Functioning Concentration: Fair Memory: Unable to Assess IQ:  (UTA) Insight: Unable to Assess Impulse Control: Unable to Assess Appetite:  (UTA) Weight Loss:  (UTA) Weight Gain:  (UTA) Sleep: Unable to Assess Vegetative Symptoms: Unable to Assess  ADLScreening [facility] Assessment Services) Patient's cognitive ability adequate to  safely complete daily activities?:  (UTA) Patient able to express need for assistance with ADLs?: Yes Independently performs ADLs?:  (UTA)  Prior Inpatient Therapy Prior Inpatient Therapy: Yes (UTA, Info obtained from chart) Prior Therapy Dates: 2002 (UTA, Info obtained from chart) Prior Therapy Facilty/Provider(s): [facility] (UTA, Info obtained from chart) Reason for Treatment: Schizophrenia (UTA, Info obtained from chart)  Prior Outpatient Therapy Prior Outpatient Therapy: Yes (UTA, Info obtained from chart) Prior Therapy Dates:  (UTA) Prior Therapy Facilty/Provider(s): [facility] (UTA, Info obtained from chart) Reason for Treatment: Schizophrenia (UTA, Info obtained from chart) Does patient have an ACCT team?: Yes (UTA, Info obtained from chart) Does patient have Intensive In-House Services?  : No (UTA, Info obtained from chart) Does patient have [facility] services? : No (UTA, Info obtained from chart) Does patient have P4CC services?: No (UTA, Info obtained from chart)  ADL Screening (condition at time of admission) Patient's cognitive ability adequate to safely complete daily activities?:  (UTA) Is the patient deaf or have difficulty hearing?: No Does the patient have difficulty seeing, even when wearing glasses/contacts?:  (UTA) Does the patient have difficulty concentrating, remembering, or making decisions?:  (UTA) Patient able to express need for assistance with ADLs?: Yes Does the patient have difficulty dressing or bathing?:  (UTA) Independently performs ADLs?:  (UTA) Does the patient have difficulty walking or climbing stairs?: No (No difficulty walking observed) Weakness of Legs:  (UTA) Weakness of Arms/Hands:  (UTA)  Home Assistive Devices/Equipment Home Assistive Devices/Equipment:  (UTA)  Therapy Consults (therapy consults require a  physician order) PT Evaluation Needed: No OT Evalulation Needed: No SLP Evaluation Needed: No Abuse/Neglect Assessment (Assessment to be  complete while patient is alone) Physical Abuse:  (UTA, Denied Per Pt Chart) Verbal Abuse: Denies (UTA, Denied Per Pt Chart) Sexual Abuse: Denies (UTA, Denied Per Pt Chart) Exploitation of patient/patient's resources: Denies Industrial/product designer, Denied Per Pt Chart) Self-Neglect: Denies (UTA, Denied Per Pt Chart) Values / Beliefs Cultural Requests During Hospitalization: None Spiritual Requests During Hospitalization: None Consults Spiritual Care Consult Needed: No Social Work Consult Needed: No Merchant navy officer (For Healthcare) Does patient have an advance directive?: No Would patient like information on creating an advanced directive?: No - patient declined information    Additional Information 1:1 In Past 12 Months?: No (UTA, Info obtained from chart) CIRT Risk: No Does patient have medical clearance?: Yes     Disposition:  Disposition Initial Assessment Completed for this Encounter: Yes Disposition of Patient: Other dispositions (D/C to Group Home upon successful contact)  On Site Evaluation by:   Reviewed with Physician:    Camerin Jimenez J [place] 09/20/2014 1:43 AM

## 2014-09-19 NOTE — ED Provider Notes (Signed)
[facility] Emergency Department Provider Note   ____________________________________________  Time seen:1950  I have reviewed the triage vital signs and the nursing notes.   HISTORY  Chief Complaint Agitation   History limited by: Not Limited   HPI Amy Villarreal is a 49 y.o. female who presents to emergency department today from her group home because of concerns for agitation. The patient states that she was acting out. She does state that she was breaking furniture. She is not able to give a good reason why she was acting out today. Patient denies any medical complaints.   Past Medical History  Diagnosis Date  . Anxiety   . Hypertension   . Gout   . GERD (gastroesophageal reflux disease)   . Asthma   . DJD (degenerative joint disease)   . Osteoporosis   . Obesity   . Constipation   . Urinary incontinence   . Schizoaffective disorder     Patient Active Problem List   Diagnosis Date Noted  . Schizophrenia 06/26/2014    Past Surgical History  Procedure Laterality Date  . Abdominal hysterectomy      Current Outpatient Rx  Name  Route  Sig  Dispense  Refill  . allopurinol (ZYLOPRIM) 100 MG tablet   Oral   Take 100 mg by mouth 2 (two) times daily.         Marland Kitchen aspirin EC 81 MG tablet   Oral   Take 81 mg by mouth daily.         . bisacodyl (DULCOLAX) 10 MG suppository   Rectal   Place 10 mg rectally as needed for moderate constipation.         . cloZAPine (CLOZARIL) 100 MG tablet   Oral   Take 300 mg by mouth at bedtime.          Marland Kitchen glipiZIDE (GLUCOTROL) 10 MG tablet   Oral   Take 10 mg by mouth daily.          Marland Kitchen lisinopril (PRINIVIL,ZESTRIL) 10 MG tablet   Oral   Take 10 mg by mouth daily.         Marland Kitchen LORazepam (ATIVAN) 0.5 MG tablet   Oral   Take 0.5 mg by mouth 2 (two) times daily.         . magnesium hydroxide (MILK OF MAGNESIA) 400 MG/5ML suspension   Oral   Take 30 mLs by mouth as needed for mild  constipation.         . meloxicam (MOBIC) 7.5 MG tablet   Oral   Take 7.5 mg by mouth daily.         Marland Kitchen omeprazole (PRILOSEC) 20 MG capsule   Oral   Take 20 mg by mouth daily.         . risperiDONE (RISPERDAL) 3 MG tablet   Oral   Take 3 mg by mouth at bedtime.         . senna-docusate (SENOKOT-S) 8.6-50 MG per tablet   Oral   Take 1 tablet by mouth 2 (two) times daily.           Allergies Review of patient's allergies indicates no known allergies.  No family history on file.  Social History Social History  Substance Use Topics  . Smoking status: Current Every Day Smoker    Types: Cigarettes  . Smokeless tobacco: None  . Alcohol Use: Yes    Review of Systems  Constitutional: Negative for fever. Cardiovascular: Negative for chest  pain. Respiratory: Negative for shortness of breath. Gastrointestinal: Negative for abdominal pain, vomiting and diarrhea. Genitourinary: Negative for dysuria. Musculoskeletal: Negative for back pain. Skin: Negative for rash. Neurological: Negative for headaches, focal weakness or numbness.  10-point ROS otherwise negative.  ____________________________________________   PHYSICAL EXAM:  VITAL SIGNS: ED Triage Vitals  Enc Vitals Group     BP 09/19/14 1813 113/79 mmHg     Pulse Rate 09/19/14 1947 74     Resp 09/19/14 1813 20     Temp 09/19/14 1813 98.7 F (37.1 C)     Temp Source 09/19/14 1813 Oral     SpO2 09/19/14 1813 96 %     Weight 09/19/14 1813 272 lb (123.378 kg)     Height 09/19/14 1813  (1.702 m)     Head Cir --    Constitutional: Alert and oriented. Well appearing and in no distress. Eyes: Conjunctivae are normal. PERRL. Normal extraocular movements. ENT   Head: Normocephalic and atraumatic.   Nose: No congestion/rhinnorhea.   Mouth/Throat: Mucous membranes are moist.   Neck: No stridor. Hematological/Lymphatic/Immunilogical: No cervical lymphadenopathy. Cardiovascular: Normal rate,  regular rhythm.  No murmurs, rubs, or gallops. Respiratory: Normal respiratory effort without tachypnea nor retractions. Breath sounds are clear and equal bilaterally. No wheezes/rales/rhonchi. Gastrointestinal: Soft and nontender. No distention.  Genitourinary: Deferred Musculoskeletal: Normal range of motion in all extremities. No joint effusions.  No lower extremity tenderness nor edema. Neurologic:  Normal speech and language. No gross focal neurologic deficits are appreciated. Speech is normal.  Skin:  Skin is warm, dry and intact. No rash noted. Psychiatric: denied SI or HI  ____________________________________________    LABS (pertinent positives/negatives)  Labs Reviewed  CBC WITH DIFFERENTIAL/PLATELET - Abnormal; Notable for the following:    WBC 16.3 (*)    Neutro Abs 10.3 (*)    Lymphs Abs 4.9 (*)    All other components within normal limits  COMPREHENSIVE METABOLIC PANEL - Abnormal; Notable for the following:    Potassium 3.4 (*)    Glucose, Bld 128 (*)    Total Protein 8.7 (*)    ALT 12 (*)    All other components within normal limits  URINALYSIS COMPLETEWITH MICROSCOPIC ([facility] ONLY) - Abnormal; Notable for the following:    Color, Urine YELLOW (*)    APPearance CLOUDY (*)    Leukocytes, UA 2+ (*)    Bacteria, UA RARE (*)    Squamous Epithelial / LPF TOO NUMEROUS TO COUNT (*)    All other components within normal limits  URINE DRUG SCREEN, QUALITATIVE ([facility] ONLY) - Abnormal; Notable for the following:    Benzodiazepine, Ur Scrn POSITIVE (*)    All other components within normal limits  ETHANOL  POC URINE PREG, ED     ____________________________________________   EKG  None  ____________________________________________    RADIOLOGY  None  ____________________________________________   PROCEDURES  Procedure(s) performed: None  Critical Care performed: No  ____________________________________________   INITIAL IMPRESSION / ASSESSMENT AND  PLAN / ED COURSE  Pertinent labs & imaging results that were available during my care of the patient were reviewed by me and considered in my medical decision making (see chart for details).  Patient presents to the emergency department from group home today because of acting out. Patient is unable to give me a good reason why she was acting out. Denies any SI or HI. Will have behavioral health see patient and discussed with group home. At this point do not feel the patient  meets commitment criteria. ____________________________________________   FINAL CLINICAL IMPRESSION(S) / ED DIAGNOSES  agitation  Phineas Semen, MD 09/20/14 0009

## 2014-09-19 NOTE — ED Notes (Signed)
States lights bother, states has been agitated at group home, states needs meds adjusted

## 2014-09-19 NOTE — ED Notes (Signed)
BEHAVIORAL HEALTH ROUNDING Patient sleeping: No. Patient alert and oriented: yes Behavior appropriate: Yes.  ;  Nutrition and fluids offered: Yes  Toileting and hygiene offered: Yes  Sitter present: yes Law enforcement present: Yes  

## 2014-09-19 NOTE — ED Notes (Signed)

## 2014-09-19 NOTE — ED Notes (Signed)
Meal tray provided to pt. Pt sitting in hallway, multiple requests for food.

## 2014-09-19 NOTE — ED Notes (Signed)
Amy Villarreal with tts speaking with pt.

## 2014-09-19 NOTE — ED Notes (Signed)
[facility] from [facility] per pt.

## 2014-09-19 NOTE — ED Notes (Signed)
[facility] x[facility] without success.

## 2014-09-19 NOTE — ED Notes (Signed)
BEHAVIORAL HEALTH ROUNDING Patient sleeping: No. Patient alert and oriented: yes Behavior appropriate: Yes.  ; If no, describe:  Nutrition and fluids offered: Yes  Toileting and hygiene offered: Yes  Sitter present: no Law enforcement present: Yes  

## 2014-09-20 DIAGNOSIS — R451 Restlessness and agitation: Secondary | ICD-10-CM | POA: Diagnosis not present

## 2014-09-20 MED ORDER — ACETAMINOPHEN 325 MG PO TABS
650.0000 mg | ORAL_TABLET | Freq: Once | ORAL | Status: AC
  Administered 2014-09-20: 650 mg via ORAL

## 2014-09-20 MED ORDER — NICOTINE 10 MG IN INHA
1.0000 | RESPIRATORY_TRACT | Status: DC | PRN
  Administered 2014-09-20 (×2): 1 via RESPIRATORY_TRACT

## 2014-09-20 MED ORDER — NICOTINE 10 MG IN INHA
RESPIRATORY_TRACT | Status: AC
  Administered 2014-09-20: 1 via RESPIRATORY_TRACT
  Filled 2014-09-20: qty 36

## 2014-09-20 MED ORDER — ACETAMINOPHEN 325 MG PO TABS
ORAL_TABLET | ORAL | Status: AC
  Administered 2014-09-20: 650 mg via ORAL
  Filled 2014-09-20: qty 2

## 2014-09-20 NOTE — ED Notes (Signed)
BEHAVIORAL HEALTH ROUNDING Patient sleeping: No. Patient alert and oriented: yes Behavior appropriate: Yes.  ; If no, describe:  Nutrition and fluids offered: Yes  Toileting and hygiene offered: Yes  Sitter present: yes Law enforcement present: Yes  

## 2014-09-20 NOTE — ED Notes (Signed)
BEHAVIORAL HEALTH ROUNDING Patient sleeping: Yes.   Patient alert and oriented: pt sleeping Behavior appropriate: Yes.  ; If no, describe:  Nutrition and fluids offered: pt sleeping Toileting and hygiene offered: pt sleeping Sitter present: no Law enforcement present: Yes  

## 2014-09-20 NOTE — Progress Notes (Signed)
Patient evaluated by Psych MD.  With recommendation to discharge back to group home and follow up with ACT team.  CSW called to [facility] ACT team, spoke to [name] [phone] , informed her patient will discharge back to group home.  ACT team will follow up with patient on Monday and talk with psych to evaluate for adjustments in medications. Group home notified and in agreement that patient will return.   Amy Villarreal. Amy Villarreal, MSW Clinical Social Work Department Emergency Room [phone] 6:03 PM

## 2014-09-20 NOTE — ED Provider Notes (Signed)
-----------------------------------------   6:01 AM on 09/20/2014 -----------------------------------------   Blood pressure 110/78, pulse 74, temperature 98.6 F (37 C), temperature source Oral, resp. rate 20, height  (1.702 m), weight 272 lb (123.378 kg), SpO2 98 %.  The patient had no acute events since last update.  Patient's plan is to be discharged back to group home once able to contact group home staff. TTS services assisting.     Sharyn Creamer, MD 09/20/14 [phone]

## 2014-09-20 NOTE — BHH Counselor (Signed)
Counselor called Park Meo at [facility] about discharge plans for Pt.  Liborio Nixon stated that she is not taking the patient back at her group home. Liborio Nixon has not given the Pt a 30 day notice, nor contacted the [name] (DSS). Liborio Nixon states the Pt is too violent and destructive.   Counselor consulted with LCSW about situation. She is going to help assist in the situation.

## 2014-09-20 NOTE — ED Notes (Signed)
BEHAVIORAL HEALTH ROUNDING Patient sleeping: No. Patient alert and oriented: yes Behavior appropriate: Yes.  ; If no, describe:  Nutrition and fluids offered: Yes  Toileting and hygiene offered: Yes  Sitter present: no Law enforcement present: Yes  

## 2014-09-20 NOTE — ED Notes (Signed)
Report to shannin, rn.

## 2014-09-20 NOTE — Clinical Social Work Note (Signed)
Clinical Social Work Assessment  Patient Details  Name: Amy Villarreal MRN: [id number] Date of Birth: 05/14/1965  Date of referral:  09/20/14               Reason for consult:  Housing Concerns/Homelessness                Permission sought to share information with:  Facility Medical sales representative, Guardian Permission granted to share information::  Yes, Verbal Permission Granted  Name::      (ACT team [facility] )  Agency::     Relationship::     Contact Information:     Housing/Transportation Living arrangements for the past 2 months:  Group Home Source of Information:  Patient Patient Interpreter Needed:  None Criminal Activity/Legal Involvement Pertinent to Current Situation/Hospitalization:  No - Comment as needed Significant Relationships:  Mental Health Amy Villarreal, Other Family Members Lives with:  Facility Resident Do you feel safe going back to the place where you live?  Yes Need for family participation in patient care:  Yes (Comment) (patient has a guardian)  Care giving concerns:  Guardian is concerned that patient  has no place to go as the group home will not take her back.    Social Worker assessment / plan:  CSW informed by TTS that group home has refused to take patient back and has not given a notice. States patient's guardian is with DSS. states she is going to church and hung up. Patient it a 49 year old female, reported to ED due to aggressive behavior at group home [facility].  Per patient she has lived in the group home for about one year.  Says she lived in about 15 group homes in the past.   CSW informed patient the group home is not going to allow her to return.  " I didn't run away from this one and I take my medicine". Patient does not understand why she cannot return.   Per patient her family lives in [place], does not want CSW to contact her mother as she does not want her mother to worry. "I will be ok if you can get me to [facility]".   Per  patient prior to living in this group home, she lived on the streets.  States she has been in the homeless shelter in [place] and [place].  She has not been to the shelter in [place].   Patient states she has a guardian however they have changed so she does not remember the name or agency is a [place] [facility] DSS.  Call to patient's [facility] ACT team Amy Villarreal [phone] to inform her the group home will not take patient back.  Group home informed ACT team patient is not allowed to return. Provided patient's guardian info.   Laurita Quint [phone] with the [facility] [place]   Call to Guardian, states unable to pick up patient to transport and has no place to take her.  Informed CSW patient is unable to live at the shelter. CSW informed guardian patient is unable to live at [facility]. Guardian will make an attempt to call group home again to inform them to come pick up patient from hospital  Employment status:    Insurance information:    PT Recommendations:  Not assessed at this time Information / Referral to community resources:     Patient/Family's Response to care:  Patient would like to go to shelter, guardian stated patient is not capable of living in  a shelter.   Patient/Family's Understanding of and Emotional Response to Diagnosis, Current Treatment, and Prognosis:  Guardian understand patient is her responsibility and is trying to get the group home to take patient back.  Emotional Assessment Appearance:  Appears older than stated age Attitude/Demeanor/Rapport:    Affect (typically observed):  Appropriate, Calm, Pleasant Orientation:  Oriented to Self, Oriented to Place, Oriented to  Time, Oriented to Situation Alcohol / Substance use:    Psych involvement (Current and /or in the community):  [facility]  [phone])  Discharge Needs  Concerns to be addressed:  Homelessness, Discharge Planning Concerns, Mental Health Concerns Readmission within the last  30 days:  Yes Current discharge risk:  Psychiatric Illness Barriers to Discharge:   (group home will not take patient back and unable to reach guardian )   Amy Villarreal 09/20/2014, 1:37 PM Sammuel Hines. Theresia Majors, MSW Clinical Social Work Department Emergency Room [phone] 1:41 PM

## 2014-09-20 NOTE — ED Provider Notes (Signed)
Patient has been cleared by psychiatry for discharge. She will follow up with the ACT team.  Emily Filbert, MD 09/20/14 [phone]

## 2014-09-20 NOTE — ED Provider Notes (Signed)
-----------------------------------------   2:17 PM on 09/20/2014 -----------------------------------------  Behavioral medicine has been speaking with the group home regarding return of this patient. My colleague, Dr. Derrill Kay found the patient not meeting criteria for involuntary commitment. The group home at this time agrees to take her back. No psychiatric consultation was previously ordered or performed.   On my exam now, the patient is alert, communicative. She appears quite eager to be discharged back to the group home. She reports that she came to the emergency department because she could see things coming through the light. This apparently has happened before, approximately a month ago area and she denies any visual hallucinations here in the emergency department.   Given my difficulty at assessing her acute issues from her chronic issues, I have called Dr. Guss Bunde and I will be placing a psychiatric consult. If Dr. Guss Bunde agrees that she is stable and cleared for discharge to the group home, she may return there today.  I will last Dr. Mayford Knife to follow-up on Dr. Ranelle Oyster report.  Final diagnosis: Schizoaffective disorder Behavioral disturbance Visual hallucinations  [name], MD 09/20/14 1451

## 2014-09-20 NOTE — ED Notes (Signed)

## 2014-09-20 NOTE — ED Notes (Signed)
ED BHU PLACEMENT JUSTIFICATION Is the patient under IVC or is there intent for IVC: No. Is the patient medically cleared: Yes.   Is there vacancy in the ED BHU: no Is the population mix appropriate for patient: No. Is the patient awaiting placement in inpatient or outpatient setting: Yes.   Has the patient had a psychiatric consult: Yes.   Survey of unit performed for contraband, proper placement and condition of furniture, tampering with fixtures in bathroom, shower, and each patient room: Yes.  ; Findings: clear APPEARANCE/BEHAVIOR cooperative NEURO ASSESSMENT Orientation: time, person, place Hallucinations: No.None noted (Hallucinations) Speech: Normal Gait: normal RESPIRATORY ASSESSMENT Normal expansion.  Clear to auscultation.  No rales, rhonchi, or wheezing. CARDIOVASCULAR ASSESSMENT regular rate and rhythm, S1, S2 normal, no murmur, click, rub or gallop GASTROINTESTINAL ASSESSMENT soft, nontender, BS WNL, no r/g EXTREMITIES normal strength, tone, and muscle mass PLAN OF CARE Provide calm/safe environment. Vital signs assessed twice daily. ED BHU Assessment once each 12-hour shift. Collaborate with intake RN daily or as condition indicates. Assure the ED Karianne Nogueira has rounded once each shift. Provide and encourage hygiene. Provide redirection as needed. Assess for escalating behavior; address immediately and inform ED Ellyssa Zagal.  Assess family dynamic and appropriateness for visitation as needed: No.; If necessary, describe findings: none available to assess Educate the patient/family about BHU procedures/visitation: No.; If necessary, describe findings: none available to educate

## 2014-09-20 NOTE — ED Notes (Signed)
BEHAVIORAL HEALTH ROUNDING Patient sleeping: Yes.   Patient alert and oriented: yes Behavior appropriate: Yes.  ; If no, describe:  Nutrition and fluids offered: Yes  Toileting and hygiene offered: Yes  Sitter present: no Law enforcement present: Yes  

## 2014-09-20 NOTE — ED Notes (Signed)
Pt up to restroom without difficulty.  

## 2014-09-20 NOTE — ED Notes (Signed)
BEHAVIORAL HEALTH ROUNDING Patient sleeping: No. Patient alert and oriented: yes Behavior appropriate: Yes.  ; If no, describe:  Nutrition and fluids offered: Yes  Toileting and hygiene offered: No Sitter present: no Law enforcement present: Yes  

## 2014-09-20 NOTE — ED Notes (Signed)
Pt up to use restroom.

## 2014-09-20 NOTE — Discharge Instructions (Signed)
Dysphoria  Dysphoria is a condition in which a person feels unpleasant or uncomfortable.   CAUSES   Dysphoria has many possible causes, including:  · Anxiety disorders.  · Psychiatric disorders.  · Withdrawal from drugs or alcohol.  · Mood disorders.  · Manic disorders.  · Transgender disorders.  · Reactions to medications.  · Personality disorders.  · Body dysmorphic disorders.  SYMPTOMS   It may include mood changes such as:  · Sadness.  · Anxiety.  · Irritability.  · Restlessness.  It may just be a feeling of wanting to crawl out of your skin.  TREATMENT   You can discuss the treatment of how you feel with your caregiver. Once they have diagnosed the cause, they can treat it.  Document Released: 05/30/2005 Document Revised: 03/13/2011 Document Reviewed: 10/05/2005  Exit[employer]® Patient Information ©2015 [employer]. This information is not intended to replace advice given to you by your health care provider. Make sure you discuss any questions you have with your health care provider.

## 2014-09-20 NOTE — Progress Notes (Signed)
Return call from patient's guardian Laurita Quint [phone] with the [facility].  She has spoken with group home owner and the owner will allow patient to return for 15 days to allow for a transition to a new home.  Guarding will locate patient a new group home in [place] near her family.  Group home unable to transport patient requested that she returns via EMS. guardian in [place] unable to transport.   CSW will issue voucher to send patient via taxi.  Group home address was verified.  Information shared with RN and EDP.  Sammuel Hines. Theresia Majors, MSW Clinical Social Work Department Emergency Room [phone] 2:13 PM

## 2014-09-20 NOTE — Consult Note (Signed)
[facility] Consult   Reason for Consult:  Follow up Referring Physician:  ER Patient Identification: Amy Villarreal MRN:  [id number] Principal Diagnosis: SAD with psychosis. Diagnosis:   Patient Active Problem List   Diagnosis Date Noted  . Schizophrenia [F20.9] 06/26/2014    Total Time spent with patient: 1 hour  Subjective:   Amy Villarreal is a 49 y.o. female patient admitted with a long H/O MI and has been living at current [place] for over a yr. Pt  Comes for " visions and seeing lights which bothered her."  HPI:   Pt reports that she takes her meds and is being followed by [facility] for meds. HPI Elements:     Past Medical History:  Past Medical History  Diagnosis Date  . Anxiety   . Hypertension   . Gout   . GERD (gastroesophageal reflux disease)   . Asthma   . DJD (degenerative joint disease)   . Osteoporosis   . Obesity   . Constipation   . Urinary incontinence   . Schizoaffective disorder     Past Surgical History  Procedure Laterality Date  . Abdominal hysterectomy     Family History: History reviewed. No pertinent family history. Social History:  History  Alcohol Use  . Yes     History  Drug Use No    Social History   Social History  . Marital Status: Single    Spouse Name: N/A  . Number of Children: N/A  . Years of Education: N/A   Social History Main Topics  . Smoking status: Current Some Day Smoker    Types: Cigarettes  . Smokeless tobacco: None  . Alcohol Use: Yes  . Drug Use: No  . Sexual Activity: Not Asked   Other Topics Concern  . None   Social History Narrative   Additional Social History:    Pain Medications: UTA Prescriptions: UTA Over the Counter: UTA History of alcohol / drug use?: No history of alcohol / drug abuse (Per Chart) Longest period of sobriety (when/how long): UTA                     Allergies:  No Known Allergies  Labs:  Results for orders placed or performed during the  hospital encounter of 09/19/14 (from the past 48 hour(s))  Ethanol     Status: None   Collection Time: 09/19/14  6:31 PM  Result Value Ref Range   Alcohol, Ethyl (B) <5 <5 mg/dL    Comment:        LOWEST DETECTABLE LIMIT FOR SERUM ALCOHOL IS 5 mg/dL FOR MEDICAL PURPOSES ONLY   CBC with Diff     Status: Abnormal   Collection Time: 09/19/14  6:31 PM  Result Value Ref Range   WBC 16.3 (H) 3.6 - 11.0 K/uL   RBC 4.39 3.80 - 5.20 MIL/uL   Hemoglobin 12.7 12.0 - 16.0 g/dL   HCT 38.4 35.0 - 47.0 %   MCV 87.6 80.0 - 100.0 fL   MCH 28.9 26.0 - 34.0 pg   MCHC 33.0 32.0 - 36.0 g/dL   RDW 13.9 11.5 - 14.5 %   Platelets 367 150 - 440 K/uL   Neutrophils Relative % 63 %   Neutro Abs 10.3 (H) 1.4 - 6.5 K/uL   Lymphocytes Relative 30 %   Lymphs Abs 4.9 (H) 1.0 - 3.6 K/uL   Monocytes Relative 5 %   Monocytes Absolute 0.9 0.2 - 0.9 K/uL  Eosinophils Relative 1 %   Eosinophils Absolute 0.2 0 - 0.7 K/uL   Basophils Relative 1 %   Basophils Absolute 0.1 0 - 0.1 K/uL  Comprehensive metabolic panel     Status: Abnormal   Collection Time: 09/19/14  6:31 PM  Result Value Ref Range   Sodium 137 135 - 145 mmol/L   Potassium 3.4 (L) 3.5 - 5.1 mmol/L   Chloride 103 101 - 111 mmol/L   CO2 25 22 - 32 mmol/L   Glucose, Bld 128 (H) 65 - 99 mg/dL   BUN 11 6 - 20 mg/dL   Creatinine, Ser 0.63 0.44 - 1.00 mg/dL   Calcium 9.7 8.9 - 10.3 mg/dL   Total Protein 8.7 (H) 6.5 - 8.1 g/dL   Albumin 4.6 3.5 - 5.0 g/dL   AST 22 15 - 41 U/L   ALT 12 (L) 14 - 54 U/L   Alkaline Phosphatase 82 38 - 126 U/L   Total Bilirubin 0.4 0.3 - 1.2 mg/dL   GFR calc non Af Amer >60 >60 mL/min   GFR calc Af Amer >60 >60 mL/min    Comment: (NOTE) The eGFR has been calculated using the CKD EPI equation. This calculation has not been validated in all clinical situations. eGFR's persistently <60 mL/min signify possible Chronic Kidney Disease.    Anion gap 9 5 - 15  Urinalysis complete, with microscopic ([facility] only)     Status:  Abnormal   Collection Time: 09/19/14  6:40 PM  Result Value Ref Range   Color, Urine YELLOW (A) YELLOW   APPearance CLOUDY (A) CLEAR   Glucose, UA NEGATIVE NEGATIVE mg/dL   Bilirubin Urine NEGATIVE NEGATIVE   Ketones, ur NEGATIVE NEGATIVE mg/dL   Specific Gravity, Urine 1.016 1.005 - 1.030   Hgb urine dipstick NEGATIVE NEGATIVE   pH 5.0 5.0 - 8.0   Protein, ur NEGATIVE NEGATIVE mg/dL   Nitrite NEGATIVE NEGATIVE   Leukocytes, UA 2+ (A) NEGATIVE   RBC / HPF 0-5 0 - 5 RBC/hpf   WBC, UA 0-5 0 - 5 WBC/hpf   Bacteria, UA RARE (A) NONE SEEN   Squamous Epithelial / LPF TOO NUMEROUS TO COUNT (A) NONE SEEN   Mucous PRESENT   Urine Drug Screen, Qualitative ([facility] only)     Status: Abnormal   Collection Time: 09/19/14  6:40 PM  Result Value Ref Range   Tricyclic, Ur Screen NONE DETECTED NONE DETECTED   Amphetamines, Ur Screen NONE DETECTED NONE DETECTED   MDMA (Ecstasy)Ur Screen NONE DETECTED NONE DETECTED   Cocaine Metabolite,Ur [place] NONE DETECTED NONE DETECTED   Opiate, Ur Screen NONE DETECTED NONE DETECTED   Phencyclidine (PCP) Ur S NONE DETECTED NONE DETECTED   Cannabinoid 50 Ng, Ur  NONE DETECTED NONE DETECTED   Barbiturates, Ur Screen NONE DETECTED NONE DETECTED   Benzodiazepine, Ur Scrn POSITIVE (A) NONE DETECTED   Methadone Scn, Ur NONE DETECTED NONE DETECTED    Comment: (NOTE) 710  Tricyclics, urine               Cutoff 1000 ng/mL 200  Amphetamines, urine             Cutoff 1000 ng/mL 300  MDMA (Ecstasy), urine           Cutoff 500 ng/mL 400  Cocaine Metabolite, urine       Cutoff 300 ng/mL 500  Opiate, urine                   Cutoff  300 ng/mL 600  Phencyclidine (PCP), urine      Cutoff 25 ng/mL 700  Cannabinoid, urine              Cutoff 50 ng/mL 800  Barbiturates, urine             Cutoff 200 ng/mL 900  Benzodiazepine, urine           Cutoff 200 ng/mL 1000 Methadone, urine                Cutoff 300 ng/mL 1100 1200 The urine drug screen provides only a preliminary,  unconfirmed 1300 analytical test result and should not be used for non-medical 1400 purposes. Clinical consideration and professional judgment should 1500 be applied to any positive drug screen result due to possible 1600 interfering substances. A more specific alternate chemical method 1700 must be used in order to obtain a confirmed analytical result.  1800 Gas chromato graphy / mass spectrometry (GC/MS) is the preferred 1900 confirmatory method.     Vitals: Blood pressure 138/101, pulse 100, temperature 98.2 F (36.8 C), temperature source Oral, resp. rate 20, height 5' 7"  (1.702 m), weight 272 lb (123.378 kg), SpO2 99 %.  Risk to Self: Suicidal Ideation: No Suicidal Intent: No Is patient at risk for suicide?: No Suicidal Plan?: No Access to Means: No What has been your use of drugs/alcohol within the last 12 months?: None Reported How many times?: 0 Other Self Harm Risks: None Reported Intentional Self Injurious Behavior: None Risk to Others: Homicidal Ideation: No Thoughts of Harm to Others: No Current Homicidal Intent: No Current Homicidal Plan: No Access to Homicidal Means: No Identified Victim: N/A History of harm to others?: No Assessment of Violence: In distant past (UTA, Info obtained from chart) Violent Behavior Description: Break items at the [place] ([place] obtained from chart) Does patient have access to weapons?: No Criminal Charges Pending?: No Does patient have a court date: No Prior Inpatient Therapy: Prior Inpatient Therapy: Yes (UTA, Info obtained from chart) Prior Therapy Dates: 2002 (UTA, Info obtained from chart) Prior Therapy Facilty/Provider(s): [facility] ([place] obtained from chart) Reason for Treatment: Schizophrenia (UTA, Info obtained from chart) Prior Outpatient Therapy: Prior Outpatient Therapy: Yes (UTA, Info obtained from chart) Prior Therapy Dates:  (UTA) Prior Therapy Facilty/Provider(s): [facility] (UTA, Info obtained from  chart) Reason for Treatment: Schizophrenia (UTA, Info obtained from chart) Does patient have an ACCT team?: Yes (UTA, Info obtained from chart) Does patient have Intensive In-House Services?  : No (UTA, Info obtained from chart) Does patient have [facility] services? : No (UTA, Info obtained from chart) Does patient have [facility] services?: No (UTA, Info obtained from chart)  Current Facility-Administered Medications  Medication Dose Route Frequency Provider Last Rate Last Dose  . nicotine (NICOTROL) 10 MG inhaler 1 continuous puffing  1 continuous puffing Inhalation PRN Ahmed Prima, MD   1 continuous puffing at 09/20/14 1200   Current Outpatient Prescriptions  Medication Sig Dispense Refill  . lisinopril (PRINIVIL,ZESTRIL) 10 MG tablet Take 10 mg by mouth daily.    Marland Kitchen omeprazole (PRILOSEC) 20 MG capsule Take 20 mg by mouth daily.    Marland Kitchen allopurinol (ZYLOPRIM) 100 MG tablet Take 100 mg by mouth 2 (two) times daily.    Marland Kitchen aspirin EC 81 MG tablet Take 81 mg by mouth daily.    . bisacodyl (DULCOLAX) 10 MG suppository Place 10 mg rectally as needed for moderate constipation.    . cloZAPine (CLOZARIL) 100 MG  tablet Take 300 mg by mouth at bedtime.     Marland Kitchen glipiZIDE (GLUCOTROL) 10 MG tablet Take 10 mg by mouth daily.     Marland Kitchen LORazepam (ATIVAN) 0.5 MG tablet Take 0.5 mg by mouth 2 (two) times daily.    . magnesium hydroxide (MILK OF MAGNESIA) 400 MG/5ML suspension Take 30 mLs by mouth as needed for mild constipation.    . meloxicam (MOBIC) 7.5 MG tablet Take 7.5 mg by mouth daily.    . risperiDONE (RISPERDAL) 3 MG tablet Take 3 mg by mouth at bedtime.    . senna-docusate (SENOKOT-S) 8.6-50 MG per tablet Take 1 tablet by mouth 2 (two) times daily.      Musculoskeletal: Strength & Muscle Tone: within normal limits Gait & Station: normal Patient leans: N/A  Psychiatric Specialty Exam: Physical Exam  Nursing note and vitals reviewed.   ROS  Blood pressure 138/101, pulse 100, temperature 98.2 F (36.8  C), temperature source Oral, resp. rate 20, height 5' 7"  (1.702 m), weight 272 lb (123.378 kg), SpO2 99 %.Body mass index is 42.59 kg/(m^2).  General Appearance: Casual  Eye Contact::  Fair  Speech:  Normal Rate  Volume:  Normal  Mood:  Anxious  Affect:  Appropriate  Thought Process:  Circumstantial  Orientation:  Full (Time, Place, and Person)  Thought Content:  NA  Suicidal Thoughts:  No  Homicidal Thoughts:  No  Memory:  Immediate;   Fair Recent;   Fair Remote;   Fair adequate.  Judgement:  Fair  Insight:  Fair  Psychomotor Activity:  Normal  Concentration:  Fair  Recall:  [employer] of Knowledge:Fair  Language: Fair  Akathisia:  No  Handed:  Right  AIMS (if indicated):     Assets:  Communication Skills Desire for Improvement Housing Social Support Transportation  ADL's:  Intact  Cognition: WNL  Sleep:      Medical Decision Making: Established Problem, Stable/Improving (1)  Treatment Plan Summary: Plan Pt is to be discharged to current [place] as she is eager to go and feels well and will be followed by [facility].  Plan:  No evidence of imminent risk to self or others at present.   Disposition: as above  Dewain Penning 09/20/2014 5:49 PM

## 2015-08-16 ENCOUNTER — Encounter: Payer: Self-pay | Admitting: *Deleted

## 2015-08-16 ENCOUNTER — Emergency Department
Admission: EM | Admit: 2015-08-16 | Discharge: 2015-08-17 | Disposition: A | Discharge status: Other Acute Inpatient Hospital | Payer: Medicare Other | Attending: Student in an Organized Health Care Education/Training Program | Admitting: Student in an Organized Health Care Education/Training Program

## 2015-08-16 DIAGNOSIS — E119 Type 2 diabetes mellitus without complications: Secondary | ICD-10-CM

## 2015-08-16 DIAGNOSIS — Z7982 Long term (current) use of aspirin: Secondary | ICD-10-CM | POA: Diagnosis not present

## 2015-08-16 DIAGNOSIS — I10 Essential (primary) hypertension: Secondary | ICD-10-CM | POA: Insufficient documentation

## 2015-08-16 DIAGNOSIS — N611 Abscess of the breast and nipple: Secondary | ICD-10-CM | POA: Diagnosis not present

## 2015-08-16 DIAGNOSIS — Z7984 Long term (current) use of oral hypoglycemic drugs: Secondary | ICD-10-CM | POA: Insufficient documentation

## 2015-08-16 DIAGNOSIS — J45909 Unspecified asthma, uncomplicated: Secondary | ICD-10-CM | POA: Insufficient documentation

## 2015-08-16 DIAGNOSIS — F203 Undifferentiated schizophrenia: Secondary | ICD-10-CM | POA: Diagnosis not present

## 2015-08-16 DIAGNOSIS — F201 Disorganized schizophrenia: Secondary | ICD-10-CM | POA: Insufficient documentation

## 2015-08-16 DIAGNOSIS — F17210 Nicotine dependence, cigarettes, uncomplicated: Secondary | ICD-10-CM | POA: Diagnosis not present

## 2015-08-16 DIAGNOSIS — Z79899 Other long term (current) drug therapy: Secondary | ICD-10-CM | POA: Diagnosis not present

## 2015-08-16 DIAGNOSIS — M109 Gout, unspecified: Secondary | ICD-10-CM

## 2015-08-16 DIAGNOSIS — F209 Schizophrenia, unspecified: Secondary | ICD-10-CM | POA: Diagnosis present

## 2015-08-16 LAB — URINE DRUG SCREEN, QUALITATIVE (ARMC ONLY)
Amphetamines, Ur Screen: NOT DETECTED
BARBITURATES, UR SCREEN: NOT DETECTED
BENZODIAZEPINE, UR SCRN: POSITIVE — AB
Cannabinoid 50 Ng, Ur ~~LOC~~: NOT DETECTED
Cocaine Metabolite,Ur ~~LOC~~: NOT DETECTED
MDMA (Ecstasy)Ur Screen: NOT DETECTED
Methadone Scn, Ur: NOT DETECTED
OPIATE, UR SCREEN: NOT DETECTED
PHENCYCLIDINE (PCP) UR S: NOT DETECTED
Tricyclic, Ur Screen: NOT DETECTED

## 2015-08-16 LAB — CBC
HEMATOCRIT: 36.7 % (ref 35.0–47.0)
Hemoglobin: 12.1 g/dL (ref 12.0–16.0)
MCH: 28.1 pg (ref 26.0–34.0)
MCHC: 32.9 g/dL (ref 32.0–36.0)
MCV: 85.6 fL (ref 80.0–100.0)
Platelets: 449 10*3/uL — ABNORMAL HIGH (ref 150–440)
RBC: 4.29 MIL/uL (ref 3.80–5.20)
RDW: 14.7 % — AB (ref 11.5–14.5)
WBC: 16.5 10*3/uL — ABNORMAL HIGH (ref 3.6–11.0)

## 2015-08-16 LAB — COMPREHENSIVE METABOLIC PANEL
ALBUMIN: 4.5 g/dL (ref 3.5–5.0)
ALK PHOS: 62 U/L (ref 38–126)
ALT: 14 U/L (ref 14–54)
ANION GAP: 8 (ref 5–15)
AST: 18 U/L (ref 15–41)
BILIRUBIN TOTAL: 0.5 mg/dL (ref 0.3–1.2)
BUN: 10 mg/dL (ref 6–20)
CALCIUM: 9.7 mg/dL (ref 8.9–10.3)
CO2: 25 mmol/L (ref 22–32)
CREATININE: 0.5 mg/dL (ref 0.44–1.00)
Chloride: 102 mmol/L (ref 101–111)
GFR calc Af Amer: >60 mL/min (ref >60)
GFR calc non Af Amer: >60 mL/min (ref >60)
GLUCOSE: 105 mg/dL — AB (ref 65–99)
Potassium: 3.5 mmol/L (ref 3.5–5.1)
Sodium: 135 mmol/L (ref 135–145)
TOTAL PROTEIN: 8.8 g/dL — AB (ref 6.5–8.1)

## 2015-08-16 LAB — SALICYLATE LEVEL: Salicylate Lvl: <4 mg/dL (ref 2.8–30.0)

## 2015-08-16 LAB — ACETAMINOPHEN LEVEL: Acetaminophen (Tylenol), Serum: <10 ug/mL — ABNORMAL LOW (ref 10–30)

## 2015-08-16 LAB — ETHANOL: Alcohol, Ethyl (B): <5 mg/dL (ref <5)

## 2015-08-16 MED ORDER — HALOPERIDOL LACTATE 5 MG/ML IJ SOLN
5.0000 mg | Freq: Once | INTRAMUSCULAR | Status: AC
  Administered 2015-08-16: 5 mg via INTRAMUSCULAR
  Filled 2015-08-16: qty 1

## 2015-08-16 MED ORDER — LORAZEPAM 2 MG PO TABS
2.0000 mg | ORAL_TABLET | Freq: Once | ORAL | Status: AC
  Administered 2015-08-16: 2 mg via ORAL
  Filled 2015-08-16: qty 1

## 2015-08-16 MED ORDER — SULFAMETHOXAZOLE-TRIMETHOPRIM 800-160 MG PO TABS
1.0000 | ORAL_TABLET | Freq: Once | ORAL | Status: AC
  Administered 2015-08-16: 1 via ORAL
  Filled 2015-08-16: qty 1

## 2015-08-16 MED ORDER — SULFAMETHOXAZOLE-TRIMETHOPRIM 800-160 MG PO TABS
1.0000 | ORAL_TABLET | Freq: Two times a day (BID) | ORAL | Status: DC
  Administered 2015-08-16 – 2015-08-17 (×2): 1 via ORAL
  Filled 2015-08-16 (×2): qty 1

## 2015-08-16 NOTE — ED Triage Notes (Signed)
Pt brought in under IVC by [facility] , pt live sat group home, group home staff state" pt has been threatening staff and destroying property, [address] [place]

## 2015-08-16 NOTE — ED Notes (Signed)
4x4 gauze placed over abscess per Dr. Roxan Hockeyobinson. Patient tolerated procedure to drain abscess well.

## 2015-08-16 NOTE — ED Provider Notes (Signed)
[facility] Emergency Department Provider Note    First MD Initiated Contact with Patient 08/16/15 2047     (approximate)  I have reviewed the triage vital signs and the nursing notes.   HISTORY  Chief Complaint Medical Clearance    HPI Amy Villarreal Mercy RidingBurgess is a 50 y.o. female history of schizoaffective disorder presents from group home as an IVC due to aggressive behavior and acute psychosis. While waiting for bed in the ER patient was aggressive and disruptive in the waiting room try to take her clothes off. Patient with flight of ideas in the ER. And on complaining of an abscess on her right breast that started 3 days ago. At this time she denies any intent for self-harm. Remainder of history is limited due to acute psychosis.   Past Medical History:  Diagnosis Date  . Anxiety   . Asthma   . Constipation   . DJD (degenerative joint disease)   . GERD (gastroesophageal reflux disease)   . Gout   . Hypertension   . Obesity   . Osteoporosis   . Schizoaffective disorder (HCC)   . Urinary incontinence     Patient Active Problem List   Diagnosis Date Noted  . Schizophrenia (HCC) 06/26/2014    Past Surgical History:  Procedure Laterality Date  . ABDOMINAL HYSTERECTOMY      Prior to Admission medications   Medication Sig Start Date End Date Taking? Authorizing Provider  allopurinol (ZYLOPRIM) 100 MG tablet Take 100 mg by mouth 2 (two) times daily.    Historical Provider, MD  aspirin EC 81 MG tablet Take 81 mg by mouth daily.    Historical Provider, MD  bisacodyl (DULCOLAX) 10 MG suppository Place 10 mg rectally as needed for moderate constipation.    Historical Provider, MD  cloZAPine (CLOZARIL) 100 MG tablet Take 300 mg by mouth at bedtime.     Historical Provider, MD  glipiZIDE (GLUCOTROL) 10 MG tablet Take 10 mg by mouth daily.     Historical Provider, MD  lisinopril (PRINIVIL,ZESTRIL) 10 MG tablet Take 10 mg by mouth daily.    Historical  Provider, MD  LORazepam (ATIVAN) 0.5 MG tablet Take 0.5 mg by mouth 2 (two) times daily.    Historical Provider, MD  magnesium hydroxide (MILK OF MAGNESIA) 400 MG/5ML suspension Take 30 mLs by mouth as needed for mild constipation.    Historical Provider, MD  meloxicam (MOBIC) 7.5 MG tablet Take 7.5 mg by mouth daily.    Historical Provider, MD  omeprazole (PRILOSEC) 20 MG capsule Take 20 mg by mouth daily.    Historical Provider, MD  risperiDONE (RISPERDAL) 3 MG tablet Take 3 mg by mouth at bedtime.    Historical Provider, MD  senna-docusate (SENOKOT-S) 8.6-50 MG per tablet Take 1 tablet by mouth 2 (two) times daily.    Historical Provider, MD    Allergies Review of patient's allergies indicates no known allergies.  No family history on file.  Social History Social History  Substance Use Topics  . Smoking status: Current Some Day Smoker    Types: Cigarettes  . Smokeless tobacco: Not on file  . Alcohol use Yes    Review of Systems Patient denies headaches, rhinorrhea, blurry vision, numbness, shortness of breath, chest pain, edema, cough, abdominal pain, nausea, vomiting, diarrhea, dysuria, fevers, rashes or hallucinations unless otherwise stated above in HPI. ____________________________________________   PHYSICAL EXAM:  VITAL SIGNS: Vitals:   08/16/15 1809  BP: (!) 115/96  Pulse: 100  Resp: 20  Temp: 98 F (36.7 C)   Constitutional: Alert Acutely psychotic Eyes: Conjunctivae are normal. PERRL. EOMI. Head: Atraumatic. Nose: No congestion/rhinnorhea. Mouth/Throat: Mucous membranes are moist.  Oropharynx non-erythematous. Neck: No stridor. Painless ROM. No cervical spine tenderness to palpation Hematological/Lymphatic/Immunilogical: No cervical lymphadenopathy. Cardiovascular: Normal rate, regular rhythm. Grossly normal heart sounds.  Good peripheral circulation. Respiratory: Normal respiratory effort.  No retractions. Lungs CTAB. Gastrointestinal: Soft and nontender.  No distention. No abdominal bruits. No CVA tenderness. Genitourinary:  Musculoskeletal: No lower extremity tenderness nor edema.  No joint effusions. Neurologic:  Normal speech and language. No gross focal neurologic deficits are appreciated. No gait instability. Skin:  Skin is warm, dry and intact. 3 cm in diameter fluctuant tender mass with overlying cellulitis of the right inferior breast.  There are no firm nodules palpated within the breast tissue. She has no nipple retraction or purulent discharge from the nipple. Psychiatric: Patient with pressured speech and flight of ideas. Very disorganized thought process. ____________________________________________   LABS (all labs ordered are listed, but only abnormal results are displayed)  Results for orders placed or performed during the hospital encounter of 08/16/15 (from the past 24 hour(s))  Comprehensive metabolic panel     Status: Abnormal   Collection Time: 08/16/15  6:13 PM  Result Value Ref Range   Sodium 135 135 - 145 mmol/L   Potassium 3.5 3.5 - 5.1 mmol/L   Chloride 102 101 - 111 mmol/L   CO2 25 22 - 32 mmol/L   Glucose, Bld 105 (H) 65 - 99 mg/dL   BUN 10 6 - 20 mg/dL   Creatinine, Ser 1.61 0.44 - 1.00 mg/dL   Calcium 9.7 8.9 - 09.6 mg/dL   Total Protein 8.8 (H) 6.5 - 8.1 g/dL   Albumin 4.5 3.5 - 5.0 g/dL   AST 18 15 - 41 U/L   ALT 14 14 - 54 U/L   Alkaline Phosphatase 62 38 - 126 U/L   Total Bilirubin 0.5 0.3 - 1.2 mg/dL   GFR calc non Af Amer >60 >60 mL/min   GFR calc Af Amer >60 >60 mL/min   Anion gap 8 5 - 15  Ethanol     Status: None   Collection Time: 08/16/15  6:13 PM  Result Value Ref Range   Alcohol, Ethyl (B) <5 <5 mg/dL  Salicylate level     Status: None   Collection Time: 08/16/15  6:13 PM  Result Value Ref Range   Salicylate Lvl <4.0 2.8 - 30.0 mg/dL  Acetaminophen level     Status: Abnormal   Collection Time: 08/16/15  6:13 PM  Result Value Ref Range   Acetaminophen (Tylenol), Serum <10 (L) 10 - 30  ug/mL  cbc     Status: Abnormal   Collection Time: 08/16/15  6:13 PM  Result Value Ref Range   WBC 16.5 (H) 3.6 - 11.0 K/uL   RBC 4.29 3.80 - 5.20 MIL/uL   Hemoglobin 12.1 12.0 - 16.0 g/dL   HCT 04.5 40.9 - 81.1 %   MCV 85.6 80.0 - 100.0 fL   MCH 28.1 26.0 - 34.0 pg   MCHC 32.9 32.0 - 36.0 g/dL   RDW 91.4 (H) 78.2 - 95.6 %   Platelets 449 (H) 150 - 440 K/uL  Urine Drug Screen, Qualitative     Status: Abnormal   Collection Time: 08/16/15  6:13 PM  Result Value Ref Range   Tricyclic, Ur Screen NONE DETECTED NONE DETECTED   Amphetamines, Ur Screen  NONE DETECTED NONE DETECTED   MDMA (Ecstasy)Ur Screen NONE DETECTED NONE DETECTED   Cocaine Metabolite,Ur [place] NONE DETECTED NONE DETECTED   Opiate, Ur Screen NONE DETECTED NONE DETECTED   Phencyclidine (PCP) Ur S NONE DETECTED NONE DETECTED   Cannabinoid 50 Ng, Ur [place] NONE DETECTED NONE DETECTED   Barbiturates, Ur Screen NONE DETECTED NONE DETECTED   Benzodiazepine, Ur Scrn POSITIVE (A) NONE DETECTED   Methadone Scn, Ur NONE DETECTED NONE DETECTED   ____________________________________________  _________________________________  RADIOLOGY  Bedside ultrasound shows of fluid collection in the right breast roughly 3 cm in diameter with no evidence of deep space fluid collection ____________________________________________   PROCEDURES  Procedure(s) performed: INCISION AND DRAINAGE Performed by: Willy EddyPatrick Bilal Manzer Consent: Verbal consent obtained. Risks and benefits: risks, benefits and alternatives were discussed Type: abscess  Body area: right breast  Anesthesia: local infiltration  Incision was made with a scalpel.  Local anesthetic: none  Anesthetic total: none  Complexity: complex Blunt dissection to break up loculations  Drainage: purulent  Drainage amount: 8cc  Packing material: left open to drain  Patient tolerance: Patient tolerated the procedure well with no immediate complications.       Critical Care  performed: no ____________________________________________   INITIAL IMPRESSION / ASSESSMENT AND PLAN / ED COURSE  Pertinent labs & imaging results that were available during my care of the patient were reviewed by me and considered in my medical decision making (see chart for details).  DDX: Psychosis, delirium, abscess, cellulitis, breast cancer medication noncompliance  Aaminah Villarreal Mercy RidingBurgess is a 50 y.o. who presents to the ED with acute psychosis and aggressive behavior from group home. Patient is acutely psychotic.  Exam does show evidence of new right inframammary superficial abscess that will require incision and drainage. Unfortunately this time I do not feel the patient is stable to perform this procedure. Given her agitation will order Haldol and Ativan and reassessed for appropriate safe conditions which we can drain this abscess. Based presentation patient will require psychiatric evaluation and probable inpatient admission.    Clinical Course  Value Comment By Time  Chloride: 102 (Reviewed) Willy EddyPatrick Valera Vallas, MD 08/14 2043     ____________________________________________   FINAL CLINICAL IMPRESSION(S) / ED DIAGNOSES  Final diagnoses:  Disorganized schizophrenia (HCC)  Abscess of breast      NEW MEDICATIONS STARTED DURING THIS VISIT:  New Prescriptions   No medications on file     Note:  This document was prepared using Dragon voice recognition software and may include unintentional dictation errors.    Willy EddyPatrick Jaspreet Hollings, MD 08/16/15 [phone]

## 2015-08-16 NOTE — Progress Notes (Signed)
Patient refused to participate with the TTS. TTS contacted caregiver Kara Mead(Clara Yancey [phone]). No one answered, Message left.  IVC documentation reports "Paranoid delusions", "Hist schizophrenia, Non-Compliance w/ psych rehab, threatening staff, destroying property".

## 2015-08-17 ENCOUNTER — Inpatient Hospital Stay
Admission: EM | Admit: 2015-08-17 | Discharge: 2015-09-01 | DRG: 885 | Payer: Medicare Other | Source: Intra-hospital | Attending: Psychiatry | Admitting: Psychiatry

## 2015-08-17 DIAGNOSIS — R4587 Impulsiveness: Secondary | ICD-10-CM | POA: Diagnosis present

## 2015-08-17 DIAGNOSIS — F17210 Nicotine dependence, cigarettes, uncomplicated: Secondary | ICD-10-CM | POA: Diagnosis present

## 2015-08-17 DIAGNOSIS — Z7984 Long term (current) use of oral hypoglycemic drugs: Secondary | ICD-10-CM

## 2015-08-17 DIAGNOSIS — F209 Schizophrenia, unspecified: Principal | ICD-10-CM | POA: Diagnosis present

## 2015-08-17 DIAGNOSIS — K219 Gastro-esophageal reflux disease without esophagitis: Secondary | ICD-10-CM | POA: Diagnosis present

## 2015-08-17 DIAGNOSIS — I10 Essential (primary) hypertension: Secondary | ICD-10-CM

## 2015-08-17 DIAGNOSIS — E119 Type 2 diabetes mellitus without complications: Secondary | ICD-10-CM | POA: Diagnosis present

## 2015-08-17 DIAGNOSIS — F203 Undifferentiated schizophrenia: Secondary | ICD-10-CM

## 2015-08-17 DIAGNOSIS — M810 Age-related osteoporosis without current pathological fracture: Secondary | ICD-10-CM | POA: Diagnosis present

## 2015-08-17 DIAGNOSIS — R32 Unspecified urinary incontinence: Secondary | ICD-10-CM | POA: Diagnosis present

## 2015-08-17 DIAGNOSIS — R45851 Suicidal ideations: Secondary | ICD-10-CM | POA: Diagnosis present

## 2015-08-17 DIAGNOSIS — Z90710 Acquired absence of both cervix and uterus: Secondary | ICD-10-CM

## 2015-08-17 DIAGNOSIS — Z79899 Other long term (current) drug therapy: Secondary | ICD-10-CM

## 2015-08-17 DIAGNOSIS — K117 Disturbances of salivary secretion: Secondary | ICD-10-CM | POA: Diagnosis present

## 2015-08-17 DIAGNOSIS — E8881 Metabolic syndrome: Secondary | ICD-10-CM | POA: Diagnosis present

## 2015-08-17 DIAGNOSIS — M1990 Unspecified osteoarthritis, unspecified site: Secondary | ICD-10-CM | POA: Diagnosis present

## 2015-08-17 DIAGNOSIS — N611 Abscess of the breast and nipple: Secondary | ICD-10-CM | POA: Diagnosis present

## 2015-08-17 DIAGNOSIS — F200 Paranoid schizophrenia: Secondary | ICD-10-CM | POA: Diagnosis not present

## 2015-08-17 DIAGNOSIS — Z590 Homelessness: Secondary | ICD-10-CM | POA: Diagnosis not present

## 2015-08-17 DIAGNOSIS — J45909 Unspecified asthma, uncomplicated: Secondary | ICD-10-CM | POA: Diagnosis present

## 2015-08-17 DIAGNOSIS — M109 Gout, unspecified: Secondary | ICD-10-CM

## 2015-08-17 DIAGNOSIS — E669 Obesity, unspecified: Secondary | ICD-10-CM | POA: Diagnosis present

## 2015-08-17 DIAGNOSIS — Z7982 Long term (current) use of aspirin: Secondary | ICD-10-CM | POA: Diagnosis not present

## 2015-08-17 DIAGNOSIS — K5900 Constipation, unspecified: Secondary | ICD-10-CM | POA: Diagnosis present

## 2015-08-17 MED ORDER — POLYETHYLENE GLYCOL 3350 17 G PO PACK
17.0000 g | PACK | Freq: Every day | ORAL | Status: DC | PRN
  Administered 2015-08-19 – 2015-08-20 (×2): 17 g via ORAL
  Filled 2015-08-17 (×2): qty 1

## 2015-08-17 MED ORDER — ALLOPURINOL 100 MG PO TABS
50.0000 mg | ORAL_TABLET | Freq: Two times a day (BID) | ORAL | Status: DC
  Administered 2015-08-17: 50 mg via ORAL
  Filled 2015-08-17 (×3): qty 0.5

## 2015-08-17 MED ORDER — LORAZEPAM 0.5 MG PO TABS
0.5000 mg | ORAL_TABLET | Freq: Three times a day (TID) | ORAL | Status: DC | PRN
  Administered 2015-08-17 (×2): 0.5 mg via ORAL
  Filled 2015-08-17 (×2): qty 1

## 2015-08-17 MED ORDER — NICOTINE 21 MG/24HR TD PT24
21.0000 mg | MEDICATED_PATCH | Freq: Every day | TRANSDERMAL | Status: DC
  Administered 2015-08-18 – 2015-09-01 (×12): 21 mg via TRANSDERMAL
  Filled 2015-08-17 (×15): qty 1

## 2015-08-17 MED ORDER — SENNOSIDES-DOCUSATE SODIUM 8.6-50 MG PO TABS
2.0000 | ORAL_TABLET | Freq: Two times a day (BID) | ORAL | Status: DC
  Administered 2015-08-17 – 2015-08-20 (×6): 2 via ORAL
  Filled 2015-08-17 (×6): qty 2

## 2015-08-17 MED ORDER — ALLOPURINOL 100 MG PO TABS
50.0000 mg | ORAL_TABLET | Freq: Two times a day (BID) | ORAL | Status: DC
  Administered 2015-08-17 – 2015-08-18 (×3): 50 mg via ORAL
  Administered 2015-08-19: 22:00:00 via ORAL
  Administered 2015-08-19 – 2015-09-01 (×26): 50 mg via ORAL
  Filled 2015-08-17 (×2): qty 2
  Filled 2015-08-17 (×6): qty 1
  Filled 2015-08-17: qty 0.5
  Filled 2015-08-17: qty 2
  Filled 2015-08-17: qty 0.5
  Filled 2015-08-17 (×2): qty 1
  Filled 2015-08-17: qty 2
  Filled 2015-08-17 (×16): qty 1

## 2015-08-17 MED ORDER — POLYETHYLENE GLYCOL 3350 17 G PO PACK: 17.0000 g | PACK | Freq: Every day | ORAL | Status: DC | PRN

## 2015-08-17 MED ORDER — LISINOPRIL 10 MG PO TABS
5.0000 mg | ORAL_TABLET | Freq: Every day | ORAL | Status: DC
  Administered 2015-08-18 – 2015-08-23 (×5): 5 mg via ORAL
  Filled 2015-08-17 (×6): qty 1

## 2015-08-17 MED ORDER — LORAZEPAM 0.5 MG PO TABS
0.5000 mg | ORAL_TABLET | Freq: Three times a day (TID) | ORAL | Status: DC | PRN
  Administered 2015-08-18: 0.5 mg via ORAL
  Filled 2015-08-17: qty 1

## 2015-08-17 MED ORDER — LISINOPRIL 5 MG PO TABS
5.0000 mg | ORAL_TABLET | Freq: Every day | ORAL | Status: DC
  Administered 2015-08-17: 5 mg via ORAL
  Filled 2015-08-17: qty 1

## 2015-08-17 MED ORDER — PANTOPRAZOLE SODIUM 40 MG PO TBEC
40.0000 mg | DELAYED_RELEASE_TABLET | Freq: Every day | ORAL | Status: DC
  Administered 2015-08-17: 40 mg via ORAL
  Filled 2015-08-17: qty 1

## 2015-08-17 MED ORDER — SULFAMETHOXAZOLE-TRIMETHOPRIM 800-160 MG PO TABS
1.0000 | ORAL_TABLET | Freq: Two times a day (BID) | ORAL | Status: DC
  Administered 2015-08-17 – 2015-08-26 (×17): 1 via ORAL
  Filled 2015-08-17 (×18): qty 1

## 2015-08-17 MED ORDER — ALUM & MAG HYDROXIDE-SIMETH 200-200-20 MG/5ML PO SUSP
30.0000 mL | ORAL | Status: DC | PRN
  Filled 2015-08-17: qty 30

## 2015-08-17 MED ORDER — SENNOSIDES-DOCUSATE SODIUM 8.6-50 MG PO TABS
2.0000 | ORAL_TABLET | Freq: Two times a day (BID) | ORAL | Status: DC
  Administered 2015-08-17: 2 via ORAL
  Filled 2015-08-17 (×2): qty 2

## 2015-08-17 MED ORDER — METFORMIN HCL 500 MG PO TABS
500.0000 mg | ORAL_TABLET | Freq: Every day | ORAL | Status: DC
  Administered 2015-08-17: 500 mg via ORAL
  Filled 2015-08-17: qty 1

## 2015-08-17 MED ORDER — HALOPERIDOL 5 MG PO TABS
5.0000 mg | ORAL_TABLET | Freq: Three times a day (TID) | ORAL | Status: DC
Start: 2015-08-17 — End: 2015-08-18
  Administered 2015-08-17 – 2015-08-18 (×2): 5 mg via ORAL
  Filled 2015-08-17 (×3): qty 1

## 2015-08-17 MED ORDER — ACETAMINOPHEN 325 MG PO TABS
650.0000 mg | ORAL_TABLET | Freq: Four times a day (QID) | ORAL | Status: DC | PRN
  Administered 2015-08-17 – 2015-08-31 (×7): 650 mg via ORAL
  Filled 2015-08-17 (×6): qty 2

## 2015-08-17 MED ORDER — HALOPERIDOL 5 MG PO TABS
5.0000 mg | ORAL_TABLET | Freq: Three times a day (TID) | ORAL | Status: DC
  Administered 2015-08-17 (×2): 5 mg via ORAL
  Filled 2015-08-17 (×2): qty 1

## 2015-08-17 MED ORDER — ASPIRIN EC 81 MG PO TBEC
81.0000 mg | DELAYED_RELEASE_TABLET | Freq: Every day | ORAL | Status: DC
  Administered 2015-08-17: 81 mg via ORAL
  Filled 2015-08-17: qty 1

## 2015-08-17 MED ORDER — METFORMIN HCL 500 MG PO TABS
500.0000 mg | ORAL_TABLET | Freq: Every day | ORAL | Status: DC
  Administered 2015-08-18 – 2015-08-24 (×7): 500 mg via ORAL
  Filled 2015-08-17 (×7): qty 1

## 2015-08-17 MED ORDER — ASPIRIN EC 81 MG PO TBEC
81.0000 mg | DELAYED_RELEASE_TABLET | Freq: Every day | ORAL | Status: DC
  Administered 2015-08-18 – 2015-09-01 (×16): 81 mg via ORAL
  Filled 2015-08-17 (×15): qty 1

## 2015-08-17 MED ORDER — MAGNESIUM HYDROXIDE 400 MG/5ML PO SUSP: 30.0000 mL | Freq: Every day | ORAL | Status: DC | PRN

## 2015-08-17 MED ORDER — PANTOPRAZOLE SODIUM 40 MG PO TBEC
40.0000 mg | DELAYED_RELEASE_TABLET | Freq: Every day | ORAL | Status: DC
  Administered 2015-08-18 – 2015-09-01 (×15): 40 mg via ORAL
  Filled 2015-08-17 (×15): qty 1

## 2015-08-17 NOTE — ED Notes (Signed)
Patient ambulating with easy and steady gait independently.  Not using walker.

## 2015-08-17 NOTE — ED Notes (Signed)
Pt given sandwich tray to eat and apple juice state she didn't get anything but peanut butter. Pt's dressing to right breast changed due to recent abscess drainage. Noted bloody drainage and odor. Pt on ABT for abscess. Pt denies pain at this time.

## 2015-08-17 NOTE — Tx Team (Signed)
Initial Interdisciplinary Treatment Plan   PATIENT STRESSORS: Health problems Medication change or noncompliance   PATIENT STRENGTHS: Communication skills Special hobby/interest   PROBLEM LIST: Problem List/Patient Goals Date to be addressed Date deferred Reason deferred Estimated date of resolution  Aggression "my behavior" 08/17/15     Schizophrenia 08/17/15                                                DISCHARGE CRITERIA:  Ability to meet basic life and health needs Adequate post-discharge living arrangements Improved stabilization in mood, thinking, and/or behavior Motivation to continue treatment in a less acute level of care Need for constant or close observation no longer present Verbal commitment to aftercare and medication compliance  PRELIMINARY DISCHARGE PLAN: Attend aftercare/continuing care group Outpatient therapy Placement in alternative living arrangements  PATIENT/FAMIILY INVOLVEMENT: This treatment plan has been presented to and reviewed with the patient, Amy Villarreal, and/or family member.  The patient and family have been given the opportunity to ask questions and make suggestions.  Stann MainlandMichelle L Parsells 08/17/2015, 11:25 PM

## 2015-08-17 NOTE — ED Notes (Signed)
Right breast dressing changed.

## 2015-08-17 NOTE — ED Notes (Signed)
Pt given saltine crackers and juice.

## 2015-08-17 NOTE — Progress Notes (Signed)
Patient ID: Amy Villarreal, female   DOB: 08-Dec-1965, 50 y.o.   MRN: [id number]  Pt admitted to unit from ED. Pt speech is loud and tangential during assessment. She requires frequent redirection. 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 Affect is labile. Pt denies SI/HI/AVH at this time. She reports that she likes listening to music and is observed singing throughout the evening. Pt uses a walker to ambulate. She rates pain in her legs 6/10 and requests PRN medication. Pt reports that her goal for admission is to work on "my behavior." Skin assessment performed and no contraband found. Pt has a dressing on her right breast, where she reports she had surgery. Scratch marks are noted on pt's left breast and abdomen. Skin is dry. Pt oriented to unit and educated on fall prevention. q15 minute safety checks maintained. Pt remains free from harm. Will continue to monitor.

## 2015-08-17 NOTE — Consult Note (Signed)
[facility] Face-to-Face Psychiatry Consult   Reason for Consult:  Consult for this 50 year old woman with a history of schizophrenia referred to the emergency room because of aggression at home. Referring Physician:  Jimmye Norman Patient Identification: Amy Villarreal MRN:  [id number] Principal Diagnosis: Schizophrenia [facility]) Diagnosis:   Patient Active Problem List   Diagnosis Date Noted  . Hypertension [I10] 08/17/2015  . Diabetes ([place]) [E11.9] 08/17/2015  . Gout [M10.9] 08/17/2015  . Schizophrenia ([place]) [F20.9] 06/26/2014    Total Time spent with patient: 1 hour  Subjective:   Amy Villarreal is a 50 y.o. female patient admitted with "because I was drinking".  HPI:  Patient interviewed. Chart reviewed. Labs and vitals reviewed. 50 year old woman well known to our service with a history of schizophrenia who was brought in under involuntary commitment reporting she's been agitated and acting bizarrely. The patient is not able to give much useful history. She couldn't even tell me where she was living recently. She seemed to be trying to indicate that she was living on the street which is evidently not true. Patient disorganized. Talks about how she was drinking although there is no evidence that she was actually recently drinking or abusing any drugs. Review of her medication shows that she is taking supposedly only Haldol right now which is much less antipsychotic than she had been taking previously. Patient admits that she had been acting in an agitated manner but can't explain anything more about it. She was sent here under commitment by her case giver.  Social history: Patient I believe has a guardian or at least a care giver looking after her. She has had multiple hospitalizations and multiple group homes but has lost position several times because of aggression.  Medical history: History of high blood pressure and diabetes and gout. Also gastric reflux. She appears to gained some weight  recently.  Substance abuse history: No significant history of substance abuse as an active problem.  Past Psychiatric History: Patient has a long history of schizophrenia resistant to multiple medications. Last time I saw her she was taking clozapine and Risperdal together. Right now she is listed as only taking Haldol. I'm told that she had a recent hospitalization and has only been out of the hospital a short period of time. It's not clear whether she's ever seriously tried to hurt herself in the past although she has been aggressive and destructive of property when psychotic.  Risk to Self: Suicidal Ideation: No Suicidal Intent: No Is patient at risk for suicide?: No Suicidal Plan?: No Access to Means: No What has been your use of drugs/alcohol within the last 12 months?: Reports of none How many times?: 0 Other Self Harm Risks: Reports of none Triggers for Past Attempts: Hallucinations Intentional Self Injurious Behavior: None Risk to Others: Homicidal Ideation: No Thoughts of Harm to Others: No Current Homicidal Intent: No Current Homicidal Plan: No Access to Homicidal Means: No Identified Victim: Reports of none History of harm to others?: No Assessment of Violence: None Noted Violent Behavior Description: She have a history of breaking things Does patient have access to weapons?: No Criminal Charges Pending?: No Does patient have a court date: No Prior Inpatient Therapy: Prior Inpatient Therapy: Yes Prior Therapy Dates: 07/2015, 10/2000 & 08/2000 Prior Therapy Facilty/Provider(s): [facility] Reason for Treatment: Schizophrenia Prior Outpatient Therapy: Prior Outpatient Therapy: No Prior Therapy Dates: Reports of none Prior Therapy Facilty/Provider(s): Reports of none Reason for Treatment: Reports of none Does patient have an ACCT team?:  No Does patient have Intensive In-House Services?  : No Does patient have [facility] services? : No Does patient have  P4CC services?: No  Past Medical History:  Past Medical History:  Diagnosis Date  . Anxiety   . Asthma   . Constipation   . DJD (degenerative joint disease)   . GERD (gastroesophageal reflux disease)   . Gout   . Hypertension   . Obesity   . Osteoporosis   . Schizoaffective disorder ([place])   . Urinary incontinence     Past Surgical History:  Procedure Laterality Date  . ABDOMINAL HYSTERECTOMY     Family History: No family history on file. Family Psychiatric  History: Patient is not able to answer this on not aware of any family history of hers. Social History:  History  Alcohol Use  . Yes     History  Drug Use No    Social History   Social History  . Marital status: Single    Spouse name: N/A  . Number of children: N/A  . Years of education: N/A   Social History Main Topics  . Smoking status: Current Some Day Smoker    Types: Cigarettes  . Smokeless tobacco: None  . Alcohol use Yes  . Drug use: No  . Sexual activity: Not Asked   Other Topics Concern  . None   Social History Narrative  . None   Additional Social History:    Allergies:  No Known Allergies  Labs:  Results for orders placed or performed during the hospital encounter of 08/16/15 (from the past 48 hour(s))  Comprehensive metabolic panel     Status: Abnormal   Collection Time: 08/16/15  6:13 PM  Result Value Ref Range   Sodium 135 135 - 145 mmol/L   Potassium 3.5 3.5 - 5.1 mmol/L   Chloride 102 101 - 111 mmol/L   CO2 25 22 - 32 mmol/L   Glucose, Bld 105 (H) 65 - 99 mg/dL   BUN 10 6 - 20 mg/dL   Creatinine, Ser 0.50 0.44 - 1.00 mg/dL   Calcium 9.7 8.9 - 10.3 mg/dL   Total Protein 8.8 (H) 6.5 - 8.1 g/dL   Albumin 4.5 3.5 - 5.0 g/dL   AST 18 15 - 41 U/L   ALT 14 14 - 54 U/L   Alkaline Phosphatase 62 38 - 126 U/L   Total Bilirubin 0.5 0.3 - 1.2 mg/dL   GFR calc non Af Amer >60 >60 mL/min   GFR calc Af Amer >60 >60 mL/min    Comment: (NOTE) The eGFR has been calculated using the CKD  EPI equation. This calculation has not been validated in all clinical situations. eGFR's persistently <60 mL/min signify possible Chronic Kidney Disease.    Anion gap 8 5 - 15  Ethanol     Status: None   Collection Time: 08/16/15  6:13 PM  Result Value Ref Range   Alcohol, Ethyl (B) <5 <5 mg/dL    Comment:        LOWEST DETECTABLE LIMIT FOR SERUM ALCOHOL IS 5 mg/dL FOR MEDICAL PURPOSES ONLY   Salicylate level     Status: None   Collection Time: 08/16/15  6:13 PM  Result Value Ref Range   Salicylate Lvl <4.6 2.8 - 30.0 mg/dL  Acetaminophen level     Status: Abnormal   Collection Time: 08/16/15  6:13 PM  Result Value Ref Range   Acetaminophen (Tylenol), Serum <10 (L) 10 - 30 ug/mL    Comment:  THERAPEUTIC CONCENTRATIONS VARY SIGNIFICANTLY. A RANGE OF 10-30 ug/mL MAY BE AN EFFECTIVE CONCENTRATION FOR MANY PATIENTS. HOWEVER, SOME ARE BEST TREATED AT CONCENTRATIONS OUTSIDE THIS RANGE. ACETAMINOPHEN CONCENTRATIONS >150 ug/mL AT 4 HOURS AFTER INGESTION AND >50 ug/mL AT 12 HOURS AFTER INGESTION ARE OFTEN ASSOCIATED WITH TOXIC REACTIONS.   cbc     Status: Abnormal   Collection Time: 08/16/15  6:13 PM  Result Value Ref Range   WBC 16.5 (H) 3.6 - 11.0 K/uL   RBC 4.29 3.80 - 5.20 MIL/uL   Hemoglobin 12.1 12.0 - 16.0 g/dL   HCT 36.7 35.0 - 47.0 %   MCV 85.6 80.0 - 100.0 fL   MCH 28.1 26.0 - 34.0 pg   MCHC 32.9 32.0 - 36.0 g/dL   RDW 14.7 (H) 11.5 - 14.5 %   Platelets 449 (H) 150 - 440 K/uL  Urine Drug Screen, Qualitative     Status: Abnormal   Collection Time: 08/16/15  6:13 PM  Result Value Ref Range   Tricyclic, Ur Screen NONE DETECTED NONE DETECTED   Amphetamines, Ur Screen NONE DETECTED NONE DETECTED   MDMA (Ecstasy)Ur Screen NONE DETECTED NONE DETECTED   Cocaine Metabolite,Ur [place] NONE DETECTED NONE DETECTED   Opiate, Ur Screen NONE DETECTED NONE DETECTED   Phencyclidine (PCP) Ur S NONE DETECTED NONE DETECTED   Cannabinoid 50 Ng, Ur [place] NONE DETECTED NONE  DETECTED   Barbiturates, Ur Screen NONE DETECTED NONE DETECTED   Benzodiazepine, Ur Scrn POSITIVE (A) NONE DETECTED   Methadone Scn, Ur NONE DETECTED NONE DETECTED    Comment: (NOTE) 956  Tricyclics, urine               Cutoff 1000 ng/mL 200  Amphetamines, urine             Cutoff 1000 ng/mL 300  MDMA (Ecstasy), urine           Cutoff 500 ng/mL 400  Cocaine Metabolite, urine       Cutoff 300 ng/mL 500  Opiate, urine                   Cutoff 300 ng/mL 600  Phencyclidine (PCP), urine      Cutoff 25 ng/mL 700  Cannabinoid, urine              Cutoff 50 ng/mL 800  Barbiturates, urine             Cutoff 200 ng/mL 900  Benzodiazepine, urine           Cutoff 200 ng/mL 1000 Methadone, urine                Cutoff 300 ng/mL 1100 1200 The urine drug screen provides only a preliminary, unconfirmed 1300 analytical test result and should not be used for non-medical 1400 purposes. Clinical consideration and professional judgment should 1500 be applied to any positive drug screen result due to possible 1600 interfering substances. A more specific alternate chemical method 1700 must be used in order to obtain a confirmed analytical result.  1800 Gas chromato graphy / mass spectrometry (GC/MS) is the preferred 1900 confirmatory method.     Current Facility-Administered Medications  Medication Dose Route Frequency Provider Last Rate Last Dose  . allopurinol (ZYLOPRIM) tablet 50 mg  50 mg Oral BID Nance Pear, MD   50 mg at 08/17/15 1118  . aspirin EC tablet 81 mg  81 mg Oral Daily Nance Pear, MD   81 mg at 08/17/15 0950  . haloperidol (HALDOL)  tablet 5 mg  5 mg Oral TID Nance Pear, MD   5 mg at 08/17/15 0951  . lisinopril (PRINIVIL,ZESTRIL) tablet 5 mg  5 mg Oral Daily Nance Pear, MD   5 mg at 08/17/15 0950  . LORazepam (ATIVAN) tablet 0.5 mg  0.5 mg Oral Q8H PRN Nance Pear, MD   0.5 mg at 08/17/15 1117  . metFORMIN (GLUCOPHAGE) tablet 500 mg  500 mg Oral Daily Nance Pear,  MD   500 mg at 08/17/15 0951  . pantoprazole (PROTONIX) EC tablet 40 mg  40 mg Oral Daily Nance Pear, MD   40 mg at 08/17/15 0956  . polyethylene glycol (MIRALAX / GLYCOLAX) packet 17 g  17 g Oral Daily PRN Nance Pear, MD      . senna-docusate (Senokot-S) tablet 2 tablet  2 tablet Oral BID Nance Pear, MD   2 tablet at 08/17/15 0954  . sulfamethoxazole-trimethoprim (BACTRIM DS,SEPTRA DS) 800-160 MG per tablet 1 tablet  1 tablet Oral Q12H Merlyn Lot, MD   1 tablet at 08/17/15 6433   Current Outpatient Prescriptions  Medication Sig Dispense Refill  . allopurinol (ZYLOPRIM) 100 MG tablet Take 50 mg by mouth 2 (two) times daily.     Marland Kitchen aspirin EC 81 MG tablet Take 81 mg by mouth daily.    . haloperidol (HALDOL) 5 MG tablet Take 5 mg by mouth 3 (three) times daily.    Marland Kitchen lisinopril (PRINIVIL,ZESTRIL) 5 MG tablet Take 5 mg by mouth daily.    Marland Kitchen LORazepam (ATIVAN) 0.5 MG tablet Take 0.5 mg by mouth every 8 (eight) hours as needed for anxiety.     . metFORMIN (GLUCOPHAGE) 500 MG tablet Take 500 mg by mouth daily.    . pantoprazole (PROTONIX) 40 MG tablet Take 40 mg by mouth daily.    . polyethylene glycol (MIRALAX / GLYCOLAX) packet Take 17 g by mouth daily as needed for mild constipation.    . senna-docusate (SENOKOT-S) 8.6-50 MG per tablet Take 2 tablets by mouth 2 (two) times daily.       Musculoskeletal: Strength & Muscle Tone: decreased Gait & Station: unsteady Patient leans: N/A  Psychiatric Specialty Exam: Physical Exam  Nursing note and vitals reviewed. Constitutional: She appears well-developed and well-nourished.  HENT:  Head: Normocephalic and atraumatic.  Eyes: Conjunctivae are normal. Pupils are equal, round, and reactive to light.  Neck: Normal range of motion.  Cardiovascular: Normal heart sounds.   Respiratory: Effort normal. No respiratory distress.  GI: Soft.  Musculoskeletal: Normal range of motion.       Legs: Neurological: She is alert.  Skin: Skin  is warm and dry.     Psychiatric: Her affect is labile and inappropriate. Her speech is tangential. She is slowed. Thought content is delusional. She expresses impulsivity. She expresses no suicidal ideation. She is noncommunicative. She exhibits abnormal recent memory.    Review of Systems  Constitutional: Negative.   HENT: Negative.   Eyes: Negative.   Respiratory: Negative.   Cardiovascular: Negative.   Gastrointestinal: Negative.   Musculoskeletal: Negative.   Skin: Negative.   Neurological: Negative.   Psychiatric/Behavioral: Positive for hallucinations and memory loss. Negative for depression, substance abuse and suicidal ideas. The patient is not nervous/anxious and does not have insomnia.     Blood pressure 100/84, pulse 85, temperature 98.7 F (37.1 C), temperature source Oral, resp. rate 15, height 5' 9"  (1.753 m), weight 113.4 kg (250 lb), SpO2 99 %.Body mass index is 36.92 kg/m.  General Appearance:  Disheveled  Eye Contact:  Good  Speech:  Garbled, Slow and Slurred  Volume:  Decreased  Mood:  Dysphoric  Affect:  Blunt  Thought Process:  Disorganized  Orientation:  Negative  Thought Content:  Illogical and Delusions  Suicidal Thoughts:  No  Homicidal Thoughts:  No  Memory:  Immediate;   Good Recent;   Fair Remote;   Fair  Judgement:  Impaired  Insight:  Shallow  Psychomotor Activity:  Decreased  Concentration:  Concentration: Poor  Recall:  [employer] of Knowledge:  Fair  Language:  Fair  Akathisia:  No  Handed:  Right  AIMS (if indicated):     Assets:  Housing Resilience Social Support  ADL's:  Intact  Cognition:  Impaired,  Mild  Sleep:        Treatment Plan Summary: Daily contact with patient to assess and evaluate symptoms and progress in treatment, Medication management and Plan 50 year old woman with schizophrenia who is currently decompensated and it much worse mental condition than I'm used to seeing her in the past. Very disorganized in her  thinking. Can't carry on really any conversation. I suspect she is on much less antipsychotic and she really needs to be. Apparently has been aggressive and violent again at her group home although she is calm now on it doesn't sound like she was action trying to hurt anyone. She will be admitted to the psychiatric ward. Continue current medicine treatment team downstairs can work on possibly restarting her other antipsychotics. Her multiple medical problems. Check hemoglobin A1c and lipid panel.  Disposition: Recommend psychiatric Inpatient admission when medically cleared.  Alethia Berthold, MD 08/17/2015 3:16 PM

## 2015-08-17 NOTE — BH Assessment (Addendum)
Assessment Note  Amy Villarreal is an 50 y.o. female who presents to the ER due to breaking things at the Group Home. Patient is known to the ER due to similar presentation. In the past she would break an item at the Group Home, "when I get paid, I have to give them (Group Home) all my money. I only get $66 dollars and they take it." She would come to the ER voicing SI or AV/H as an attempted to relocate to another Group Home so she doesn't have to pay for the item(s). Thus, she's discharged back to the Group Home.  However, with this ER visit, patient isn't at baseline. She's having thought blocking. When asked questions, majority of her answers are not appropriate to the questions. For example, when asked what brought her to the ER, she start talking about a computers and how someone was asking her to leave. Then she started singing "Computer Love." And then stated, "You know Amy Villarreal help write that song.  Patient have no history of aggression and violence towards others.  While in the ER, nursing staff have observed the patient responding to internal stimuli.  [place] [address]. Representative is Amy Villarreal [phone]).   Diagnosis: Schizoaffective  Past Medical History:  Past Medical History:  Diagnosis Date  . Anxiety   . Asthma   . Constipation   . DJD (degenerative joint disease)   . GERD (gastroesophageal reflux disease)   . Gout   . Hypertension   . Obesity   . Osteoporosis   . Schizoaffective disorder ([facility])   . Urinary incontinence     Past Surgical History:  Procedure Laterality Date  . ABDOMINAL HYSTERECTOMY      Family History: No family history on file.  Social History:  reports that she has been smoking Cigarettes.  She does not have any smokeless tobacco history on file. She reports that she drinks alcohol. She reports that she does not use drugs.  Additional Social History:  Alcohol / Drug Use Pain Medications: See  PTA Prescriptions: See PTA Over the Counter: See PTA History of alcohol / drug use?: No history of alcohol / drug abuse Longest period of sobriety (when/how long): See PTA Negative Consequences of Use:  (Denies history of substance use) Withdrawal Symptoms:  (Reports of none)  CIWA: CIWA-Ar BP: 113/76 Pulse Rate: 86 COWS:    Allergies: No Known Allergies  Home Medications:  (Not in a hospital admission)  OB/GYN Status:  No LMP recorded. Patient has had a hysterectomy.  General Assessment Data [facility] Assessment: In system Is this a Tele or Face-to-Face Assessment?: Face-to-Face Is this an Initial Assessment or a Re-assessment for this encounter?: Initial Assessment Marital status: Single Maiden name: n/a Is patient pregnant?: No Pregnancy Status: No Living Arrangements: Group Home Can pt return to current living arrangement?: No Admission Status: Involuntary Is patient capable of signing voluntary admission?: No Referral Source: Other (Group Home) Insurance type: Medicare  Medical Screening Exam [facility] Walk-in ONLY) Medical Exam completed: Yes  Crisis Care Plan Living Arrangements: Group Home Legal Guardian: Other: ([employer] [place]) Name of Psychiatrist: Reports of none Name of Therapist: Reports of none  Education Status Is patient currently in school?: No Current Grade: n/a Highest grade of school patient has completed: Unknown Name of school: n/a Contact person: n/a  Risk to self with the past 6 months Suicidal Ideation: No Has patient been a risk to self  within the past 6 months prior to admission? : No Suicidal Intent: No Has patient had any suicidal intent within the past 6 months prior to admission? : No Is patient at risk for suicide?: No Suicidal Plan?: No Has patient had any suicidal plan within the past 6 months prior to admission? : No Access to Means: No What has been your use of drugs/alcohol within the last 12  months?: Reports of none Previous Attempts/Gestures: No How many times?: 0 Other Self Harm Risks: Reports of none Triggers for Past Attempts: Hallucinations Intentional Self Injurious Behavior: None Family Suicide History: Unknown Recent stressful life event(s): Other (Comment) (Psychosis) Persecutory voices/beliefs?: No Depression: Yes Depression Symptoms: Feeling angry/irritable, Isolating, Guilt, Feeling worthless/self pity Substance abuse history and/or treatment for substance abuse?: No Suicide prevention information given to non-admitted patients: Not applicable  Risk to Others within the past 6 months Homicidal Ideation: No Does patient have any lifetime risk of violence toward others beyond the six months prior to admission? : No Thoughts of Harm to Others: No Current Homicidal Intent: No Current Homicidal Plan: No Access to Homicidal Means: No Identified Victim: Reports of none History of harm to others?: No Assessment of Violence: None Noted Violent Behavior Description: She have a history of breaking things Does patient have access to weapons?: No Criminal Charges Pending?: No Does patient have a court date: No Is patient on probation?: No  Psychosis Hallucinations: Auditory Delusions: Unspecified  Mental Status Report Appearance/Hygiene: In scrubs, Unremarkable, In hospital gown Eye Contact: Good Motor Activity: Freedom of movement, Shuffling (Patient is currently using a walker) Speech: Pressured Level of Consciousness: Alert Mood: Anxious Affect: Anxious, Labile Anxiety Level: Minimal Thought Processes: Thought Blocking, Tangential Judgement: Impaired Orientation: Person, Place Obsessive Compulsive Thoughts/Behaviors: Minimal  Cognitive Functioning Concentration: Normal Memory: Recent Intact, Remote Intact IQ: Average Insight: Poor Impulse Control: Poor Appetite: Good Weight Loss: 0 Weight Gain: 0 Sleep: No Change Total Hours of Sleep:  8 Vegetative Symptoms: None  ADLScreening [facility] Assessment Services) Patient's cognitive ability adequate to safely complete daily activities?: Yes Patient able to express need for assistance with ADLs?: Yes Independently performs ADLs?: Yes (appropriate for developmental age)  Prior Inpatient Therapy Prior Inpatient Therapy: Yes Prior Therapy Dates: 07/2015, 10/2000 & 08/2000 [facility] Reason for Treatment: Schizophrenia  Prior Outpatient Therapy Prior Outpatient Therapy: No Prior Therapy Dates: Reports of none Prior Therapy Facilty/Provider(s): Reports of none Reason for Treatment: Reports of none Does patient have an ACCT team?: No Does patient have Intensive In-House Services?  : No Does patient have [facility] services? : No Does patient have P4CC services?: No  ADL Screening (condition at time of admission) Patient's cognitive ability adequate to safely complete daily activities?: Yes Is the patient deaf or have difficulty hearing?: No Does the patient have difficulty seeing, even when wearing glasses/contacts?: No Does the patient have difficulty concentrating, remembering, or making decisions?: No Patient able to express need for assistance with ADLs?: Yes Does the patient have difficulty dressing or bathing?: No Independently performs ADLs?: Yes (appropriate for developmental age) Does the patient have difficulty walking or climbing stairs?: No Weakness of Legs: None Weakness of Arms/Hands: None  Home Assistive Devices/Equipment Home Assistive Devices/Equipment: None  Therapy Consults (therapy consults require a physician order) PT Evaluation Needed: No OT Evalulation Needed: No SLP Evaluation Needed: No Abuse/Neglect Assessment (Assessment to be complete while patient is alone) Physical Abuse: Denies Verbal Abuse: Denies Sexual Abuse: Denies Exploitation of patient/patient's resources: Denies Self-Neglect:  Denies Values / Beliefs Cultural Requests During Hospitalization: None Spiritual Requests During Hospitalization: None Consults Spiritual Care Consult Needed: No Social Work Consult Needed: No Merchant navy officer (For Healthcare) Does patient have an advance directive?: No Would patient like information on creating an advanced directive?: No - patient declined information    Additional Information 1:1 In Past 12 Months?: No CIRT Risk: No Elopement Risk: No Does patient have medical clearance?: Yes  Child/Adolescent Assessment Running Away Risk: Denies (Patient is an adult)  Disposition:  Disposition Initial Assessment Completed for this Encounter: Yes Disposition of Patient: Other dispositions (ER MD ordered Psych Consult)  On Site Evaluation by:   Reviewed with Physician:    Lilyan Gilford MS, LCAS, LPC, NCC, CCSI Therapeutic Triage Specialist 08/17/2015 1:04 PM

## 2015-08-17 NOTE — BH Assessment (Signed)
Writer spoke with Guardian ([name]), she is aware the patient is in the ER. She forwarded a copy of the patient's guardianship papers and copy was placed on the patient's paper chart.

## 2015-08-17 NOTE — ED Notes (Signed)
Pt resting.

## 2015-08-17 NOTE — ED Notes (Signed)
Pts Guardian Evalyn Cascolara Yancy [phone]. [employer] number 2 Caregiver is Aggie Cosierheresa [phone]. Medicine packets [facility] to [facility] tech.

## 2015-08-17 NOTE — BH Assessment (Signed)
[facility] by Dr. Toni Amendlapacs.  Attending Physician will be Dr. Ardyth HarpsHernandez.   Patient has been [facility] to room 306, [facility] Gwen.   Intake Paper Work has been signed and placed on patient chart.  ER staff is aware of the admission Rivka Barbara(Glenda, ER Sect.; Dr. Scotty CourtStafford, ER MD; Erskine SquibbJane, Patient's Nurse & Byrd HesselbachMaria, Patient Access).

## 2015-08-17 NOTE — ED Notes (Signed)
Pt given breakfast tray

## 2015-08-17 NOTE — ED Notes (Signed)
Pt up to the bathroom with walker one min assist. Pts dressing to right breast CDI. No distress noted. Will continue to monitor.

## 2015-08-18 DIAGNOSIS — F203 Undifferentiated schizophrenia: Secondary | ICD-10-CM

## 2015-08-18 LAB — CBC WITH DIFFERENTIAL/PLATELET
Basophils Absolute: 0.1 10*3/uL (ref 0–0.1)
Basophils Relative: 1 %
EOS ABS: 0.2 10*3/uL (ref 0–0.7)
Eosinophils Relative: 1 %
HEMATOCRIT: 35.7 % (ref 35.0–47.0)
HEMOGLOBIN: 11.8 g/dL — AB (ref 12.0–16.0)
LYMPHS ABS: 3.5 10*3/uL (ref 1.0–3.6)
LYMPHS PCT: 23 %
MCH: 28.3 pg (ref 26.0–34.0)
MCHC: 33 g/dL (ref 32.0–36.0)
MCV: 85.7 fL (ref 80.0–100.0)
Monocytes Absolute: 0.8 10*3/uL (ref 0.2–0.9)
Monocytes Relative: 5 %
NEUTROS ABS: 10.7 10*3/uL — AB (ref 1.4–6.5)
NEUTROS PCT: 70 %
PLATELETS: 462 10*3/uL — AB (ref 150–440)
RBC: 4.17 MIL/uL (ref 3.80–5.20)
RDW: 14.7 % — ABNORMAL HIGH (ref 11.5–14.5)
WBC: 15.2 10*3/uL — AB (ref 3.6–11.0)

## 2015-08-18 LAB — LIPID PANEL
CHOL/HDL RATIO: 5 ratio
CHOLESTEROL: 179 mg/dL (ref 0–200)
HDL: 36 mg/dL — AB (ref >40)
LDL Cholesterol: 122 mg/dL — ABNORMAL HIGH (ref 0–99)
TRIGLYCERIDES: 103 mg/dL (ref <150)
VLDL: 21 mg/dL (ref 0–40)

## 2015-08-18 LAB — HEMOGLOBIN A1C: Hgb A1c MFr Bld: 6.5 % — ABNORMAL HIGH (ref 4.0–6.0)

## 2015-08-18 LAB — GLUCOSE, CAPILLARY: Glucose-Capillary: 117 mg/dL — ABNORMAL HIGH (ref 65–99)

## 2015-08-18 LAB — TSH: TSH: 1.578 u[IU]/mL (ref 0.350–4.500)

## 2015-08-18 MED ORDER — CLOZAPINE 25 MG PO TABS
50.0000 mg | ORAL_TABLET | Freq: Every day | ORAL | Status: DC
  Administered 2015-08-18: 50 mg via ORAL
  Filled 2015-08-18: qty 2

## 2015-08-18 MED ORDER — DIPHENHYDRAMINE HCL 50 MG/ML IJ SOLN: 50.0000 mg | Freq: Four times a day (QID) | INTRAMUSCULAR | Status: DC | PRN

## 2015-08-18 MED ORDER — AMANTADINE HCL 100 MG PO CAPS
100.0000 mg | ORAL_CAPSULE | Freq: Two times a day (BID) | ORAL | Status: DC
  Administered 2015-08-18 – 2015-08-19 (×3): 100 mg via ORAL
  Filled 2015-08-18 (×4): qty 1

## 2015-08-18 MED ORDER — HALOPERIDOL 5 MG PO TABS
10.0000 mg | ORAL_TABLET | Freq: Two times a day (BID) | ORAL | Status: DC
  Administered 2015-08-18 – 2015-08-25 (×13): 10 mg via ORAL
  Filled 2015-08-18 (×14): qty 2

## 2015-08-18 MED ORDER — HALOPERIDOL 5 MG PO TABS
5.0000 mg | ORAL_TABLET | Freq: Four times a day (QID) | ORAL | Status: DC | PRN
  Administered 2015-08-19 – 2015-08-23 (×4): 5 mg via ORAL
  Filled 2015-08-18 (×5): qty 1

## 2015-08-18 MED ORDER — DIPHENHYDRAMINE HCL 25 MG PO CAPS
50.0000 mg | ORAL_CAPSULE | Freq: Four times a day (QID) | ORAL | Status: DC | PRN
  Administered 2015-08-19 – 2015-08-23 (×4): 50 mg via ORAL
  Filled 2015-08-18: qty 1
  Filled 2015-08-18 (×5): qty 2

## 2015-08-18 MED ORDER — LORAZEPAM 1 MG PO TABS
1.0000 mg | ORAL_TABLET | Freq: Three times a day (TID) | ORAL | Status: DC | PRN
Start: 2015-08-18 — End: 2015-08-18

## 2015-08-18 MED ORDER — LORAZEPAM 2 MG PO TABS
2.0000 mg | ORAL_TABLET | Freq: Four times a day (QID) | ORAL | Status: DC | PRN
  Administered 2015-08-18 – 2015-08-29 (×11): 2 mg via ORAL
  Filled 2015-08-18 (×11): qty 1

## 2015-08-18 MED ORDER — LORAZEPAM 2 MG/ML IJ SOLN: 2.0000 mg | Freq: Four times a day (QID) | INTRAMUSCULAR | Status: DC | PRN

## 2015-08-18 MED ORDER — HALOPERIDOL LACTATE 5 MG/ML IJ SOLN: 5.0000 mg | Freq: Four times a day (QID) | INTRAMUSCULAR | Status: DC | PRN

## 2015-08-18 NOTE — [facility] Suicide Risk Assessment (Signed)
[facility]   Nursing information obtained from:  Patient, Review of record Demographic factors:  NA Current Mental Status:  NA Loss Factors:  Decline in physical health Historical Factors:  NA Risk Reduction Factors:  Positive coping skills or problem solving skills  Total Time spent with patient: 1 hour Principal Problem: Schizophrenia (HCC) Diagnosis:   Patient Active Problem List   Diagnosis Date Noted  . Hypertension [I10] 08/17/2015  . Diabetes (HCC) [E11.9] 08/17/2015  . Gout [M10.9] 08/17/2015  . Schizophrenia (HCC) [F20.9] 06/26/2014   Subjective Data:   Continued Clinical Symptoms:  Alcohol Use Disorder Identification Test Final Score (AUDIT): 1 The "Alcohol Use Disorders Identification Test", Guidelines for Use in Primary Care, Second Edition.  World Science writerHealth Organization [facility](WHO). Score between 0-7:  no or low risk or alcohol related problems. Score between 8-15:  moderate risk of alcohol related problems. Score between 16-19:  high risk of alcohol related problems. Score 20 or above:  warrants further diagnostic evaluation for alcohol dependence and treatment.   CLINICAL FACTORS:   Severe Anxiety and/or Agitation Schizophrenia:   Paranoid or undifferentiated type Currently Psychotic Previous Psychiatric Diagnoses and Treatments    Psychiatric Specialty Exam: Physical Exam  Constitutional: She is oriented to person, place, and time. She appears well-developed and well-nourished.  HENT:  Head: Normocephalic and atraumatic.  Neck: Normal range of motion.  Respiratory: Effort normal.  Musculoskeletal: Normal range of motion.  Neurological: She is alert and oriented to person, place, and time.    ROS                                                      Sleep:  Number of Hours: 5      COGNITIVE FEATURES THAT CONTRIBUTE TO RISK:  Loss of executive function    SUICIDE RISK:   Moderate:  Frequent suicidal  ideation with limited intensity, and duration, some specificity in terms of plans, no associated intent, good self-control, limited dysphoria/symptomatology, some risk factors present, and identifiable protective factors, including available and accessible social support.   [facility] C[facility]  I certify that inpatient services furnished can reasonably be expected to improve the patient's condition.  [name],  Jovanka Westgate, MD 08/18/2015, 6:07 PM

## 2015-08-18 NOTE — Tx Team (Addendum)
Interdisciplinary Treatment Plan Update (Adult)         Date: 08/18/2015   Time Reviewed: 10:30 AM   Progress in Treatment: Improving Attending groups: Yes  Participating in groups: Yes  Taking medication as prescribed: Yes  Tolerating medication: Yes  Family/Significant other contact made: CSW assessing for proper contacts Patient understands diagnosis: Yes  Discussing patient identified problems/goals with staff: Yes  Medical problems stabilized or resolved: Yes  Denies suicidal/homicidal ideation: Yes  Issues/concerns per patient self-inventory: Yes  Other:   New problem(s) identified: N/A   Discharge Plan or Barriers: see below   Reason for Continuation of Hospitalization:   Depression   Anxiety   Medication Stabilization   Comments: N/A   Estimated length of stay: 3-5 days    Patient is a 50 year old female admitted for aggressive behaviors with a diagnosis of schizophrenia. Patient lives in [place], [place]. Patient will benefit from crisis stabilization, medication evaluation, group therapy, and psycho education in addition to case management for discharge planning. Patient and CSW reviewed pt's identified goals and treatment plan. Pt verbalized understanding and agreed to treatment plan.    Review of initial/current patient goals per problem list:  1. Goal(s): Patient will participate in aftercare plan   Met: No  Target date: 3-5 days post admission date   As evidenced by: Patient will participate within aftercare plan AEB aftercare provider and housing plan at discharge being identified.   CSW assessing proper aftercare plans.  2. Goal (s): Patient will exhibit decreased depressive symptoms and suicidal ideations.   Met: Goal progressing   Target date: 3-5 days post admission date   As evidenced by: Patient will utilize self-rating of depression at 3 or below and demonstrate decreased signs of depression or be deemed stable for discharge by MD.   Pt  reports a depression score of 5 at this time. Denies SI/HI at this time.  3. Goal(s): Patient will demonstrate decreased signs and symptoms of anxiety.   Met: No  Target date: 3-5 days post admission date   As evidenced by: Patient will utilize self-rating of anxiety at 3 or below and demonstrated decreased signs of anxiety, or be deemed stable for discharge by MD   Pt reports an anxiety score of 5 at this time.   5. Goal(s): Patient will demonstrate decreased signs of psychosis  * Met: No * Target date: 3-5 days post admission date  * As evidenced by: Patient will demonstrate decreased frequency of AVH or return to baseline function   Pt is still psychotic at this time - AVH at this time.    Attendees:  Patient: Amy Villarreal  Family:  Physician: Merlyn Albert, MD    08/18/2015 10:30AM  Nursing: Polly Cobia, RN     08/18/2015 10:30AM  Clinical Social Worker: Glorious Peach, [place]  08/18/2015 10:30AM  Other: Recreational Therapist, Everitt Amber  08/18/2015 10:30AM

## 2015-08-18 NOTE — Progress Notes (Signed)
Pt in hallway yelling. She is observed spitting on the floor. PRN Ativan administered. Pt remains free from harm. Will continue to monitor.

## 2015-08-18 NOTE — Plan of Care (Signed)
Problem: Coping: Goal: Ability to demonstrate self-control will improve Outcome: Not Progressing Patient was loud in the hall way.

## 2015-08-18 NOTE — Progress Notes (Signed)
Recreation Therapy Notes  Date: 08.16.17 Time: 1:00 pm Location: Craft Room  Group Topic: Self-esteem  Goal Area(s) Addresses:  Patient will write at least one positive trait. Patient will verbalize benefit of having a healthy self-esteem.  Behavioral Response: Disruptive  Intervention: I Am  Activity: Patients were given a worksheet with the letter I on it and instructed to write as many positive traits inside the letter.  Education: LRT educated patients on ways they can increase their self-esteem.  Education Outcome: In group clarification offered   Clinical Observations/Feedback: Patient did not participate in group activity. Patient was talking during group activity. LRT attempted to redirect patient. Patient difficult to redirect. Patient would make hand motions and move her feet around. Patient did apologize for her behavior at the end of group.  Jacquelynn CreeGreene,Afshin Chrystal M, LRT/CTRS 08/18/2015 2:34 PM

## 2015-08-18 NOTE — [facility] Group Notes (Signed)
[facility]  08/18/2015 2:48 PM  Did not attend  Glennon MacSara P Abdalla Naramore, MSW,LCSW 08/18/2015, 2:48 PM

## 2015-08-18 NOTE — Progress Notes (Signed)
MEDICATION RELATED CONSULT NOTE - INITIAL   Pharmacy Consult for clozapine Indication: schizophrenia  No Known Allergies  Patient Measurements: Height: 5\' 9"  (175.3 cm) Weight: 261 lb (118.4 kg) IBW/kg (Calculated) : 66.2  Vital Signs:   Intake/Output from previous day: No intake/output data recorded. Intake/Output from this shift: No intake/output data recorded.  Labs:  Recent Labs  08/16/15 1813 08/18/15 1606  WBC 16.5* 15.2*  HGB 12.1 11.8*  HCT 36.7 35.7  PLT 449* 462*  CREATININE 0.50  --   ALBUMIN 4.5  --   PROT 8.8*  --   AST 18  --   ALT 14  --   ALKPHOS 62  --   BILITOT 0.5  --    Estimated Creatinine Clearance: 119.5 mL/min (by C-G formula based on SCr of 0.8 mg/dL).   Microbiology: No results found for this or any previous visit (from the past 720 hour(s)).  Medical History: Past Medical History:  Diagnosis Date  . Anxiety   . Asthma   . Constipation   . DJD (degenerative joint disease)   . GERD (gastroesophageal reflux disease)   . Gout   . Hypertension   . Obesity   . Osteoporosis   . Schizoaffective disorder (HCC)   . Urinary incontinence     Medications:  Scheduled:  . allopurinol  50 mg Oral BID  . amantadine  100 mg Oral BID  . aspirin EC  81 mg Oral Daily  . cloZAPine  50 mg Oral QHS  . haloperidol  10 mg Oral BID  . lisinopril  5 mg Oral Daily  . metFORMIN  500 mg Oral Q breakfast  . nicotine  21 mg Transdermal Daily  . pantoprazole  40 mg Oral Daily  . senna-docusate  2 tablet Oral BID  . sulfamethoxazole-trimethoprim  1 tablet Oral Q12H    Assessment: 50 yo female with order for clozapine 50mg  PO QHS for schizophrenia.   08/16: ANC 10.7   Goal of Therapy:  Continue therapy and monitor for side effects.   Plan:  Submitted current ANC to Clozapine Registry. Pt is eligible for dispensing. Next labs are due 8/23.   Tresa EndoKelly m Dari Carpenito 08/18/2015,7:54 PM

## 2015-08-18 NOTE — Progress Notes (Signed)
Patient was loud & little irritable this morning.Affect is labile.Patient was intrusive at times but redirectable.Denies suicidal or homicidal ideations & AV hallucinations.Compliant with medications.Attended some groups.Patient asked for wheelchair since she got tired of walking with the walker.Appetite & energy level good.
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
## 2015-08-19 LAB — PROLACTIN: Prolactin: 50.7 ng/mL — ABNORMAL HIGH (ref 4.8–23.3)

## 2015-08-19 MED ORDER — CLOZAPINE 100 MG PO TABS
100.0000 mg | ORAL_TABLET | Freq: Every day | ORAL | Status: DC
  Administered 2015-08-19: 100 mg via ORAL
  Filled 2015-08-19: qty 1

## 2015-08-19 MED ORDER — AMANTADINE HCL 50 MG/5ML PO SYRP
100.0000 mg | ORAL_SOLUTION | Freq: Two times a day (BID) | ORAL | Status: DC
  Administered 2015-08-19 – 2015-08-22 (×7): 100 mg via ORAL
  Administered 2015-08-23: 10 mg via ORAL
  Filled 2015-08-19 (×9): qty 10
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
## 2015-08-19 NOTE — Progress Notes (Signed)
D: Patient  Loud and intrusive throughout shift. Patient upset at meal time about her trays sent  Patient threw food at staff.  . Argumentative with staff.  Moving about on unit with wheelchair or walker  Appropriate ADL . Limited interaction with peers . Unable to understand the dynamics of group. Voice no concerns around sleep . Denies Depression and suicidal  .  No auditory hallucina  A: Encourage patient participation with unit programming . Instruction  Given on  Medication , verbalize understanding. R: Voice no other concerns. Staff continue to monitor

## 2015-08-19 NOTE — [facility] Counselor (Signed)
CSW is unable to complete PSA at this time, pt is too acute at this time. CSW will attempt to complete PSA at a later time.   Lynden OxfordKadijah R. Meriam Chojnowski, MSW, LCSW-A 08/19/2015   10:38AM

## 2015-08-19 NOTE — [facility] Group Notes (Signed)
[facility] LCSW Group Therapy  08/19/2015 2:25 PM  Type of Therapy:  Group Therapy  Participation Level:  None  Participation Quality:  Inattentive  Affect:  Not Congruent  Cognitive:  Confused  Insight:  None  Engagement in Therapy:  None  Modes of Intervention:  Discussion, Education and Support  Summary of Progress/Problems: Balance in life: Patients will discuss the concept of balance and how it looks and feels to be unbalanced. Pt will identify areas in their life that is unbalanced and ways to become more balanced. Pt did not participate in group and left out of group three different times. Pt would not participate when prompted by CSW.   Kweli Grassel G. Garnette CzechSampson MSW, LCSWA 08/19/2015, 2:35 PM

## 2015-08-19 NOTE — Plan of Care (Signed)
Problem: Health Behavior/Discharge Planning: Goal: Ability to manage health-related needs will improve Outcome: Progressing Encourage patient participation to be  More  independent

## 2015-08-19 NOTE — [facility] Group Notes (Signed)
[facility] Group Notes:  (Nursing/MHT/Case Management/Adjunct)  Date:  08/19/2015  Time:  4:13 PM  Type of Therapy:  Psychoeducational Skills  Participation Level:  Did Not Attend  Twanna Hymanda C Sorayah Schrodt 08/19/2015, 4:13 PM

## 2015-08-19 NOTE — [facility] Suicide Risk Assessment (Signed)
[facility] INPATIENT:  Family/Significant Other Suicide Prevention Education  Suicide Prevention Education:  Education Completed; pt's guardian, Barbarann EhlersVeda Villarreal ph #: [phone] has been identified by the patient as the family member/significant other with whom the patient will be residing, and identified as the person(s) who will aid the patient in the event of a mental health crisis (suicidal ideations/suicide attempt).  With written consent from the patient, the family member/significant other has been provided the following suicide prevention education, prior to the and/or following the discharge of the patient. [name] stated that pt is too aggressive and has not been able to be placed due to her acuity and psychosis. Guardian [facility]. CSW will submit application today (08/19/15).  The suicide prevention education provided includes the following:  Suicide risk factors  Suicide prevention and interventions  National Suicide Hotline telephone number  [facility] assessment telephone number  [facility] Emergency Assistance 911  [facility] and/or [facility]idential Mobile [facility] telephone number  Request made of family/significant other to:  Remove weapons (e.g., guns, rifles, knives), all items previously/currently identified as safety concern.    Remove drugs/medications (over-the-counter, prescriptions, illicit drugs), all items previously/currently identified as a safety concern.  The family member/significant other verbalizes understanding of the suicide prevention education information provided.  The family member/significant other agrees to remove the items of safety concern listed above.  Lynden OxfordKadijah R Clarivel Villarreal, MSW, LCSW-A 08/19/2015, 2:02 PM
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
## 2015-08-19 NOTE — [facility] Group Notes (Signed)
[facility] LCSW Group Therapy Note  Type of Therapy and Topic:  Group Therapy:  Goals Group: SMART Goals  Participation Level:  Patient attended group on this date. Patient participated in goal setting and was able to share openly with the group.   Description of Group:    The purpose of a daily goals group is to assist and guide patients in setting recovery/wellness-related goals.  The objective is to set goals as they relate to the crisis in which they were admitted. Patients will be using SMART goal modalities to set measurable goals.  Characteristics of realistic goals will be discussed and patients will be assisted in setting and processing how one will reach their goal. Facilitator will also assist patients in applying interventions and coping skills learned in psycho-education groups to the SMART goal and process how one will achieve defined goal.  Therapeutic Goals: -Patients will develop and document one goal related to or their crisis in which brought them into treatment. -Patients will be guided by LCSW using SMART goal setting modality in how to set a measurable, attainable, realistic and time sensitive goal.  -Patients will process barriers in reaching goal. -Patients will process interventions in how to overcome and successful in reaching goal.   Summary of Patient Progress:  Patient Goal: Patient stated she wants to work on her goal of developing effective coping skills. Patient shared that deep breathing exercises have been helping with her anxiety.    Therapeutic Modalities:   Motivational Interviewing  Engineer, manufacturing systemsCognitive Behavioral Therapy Crisis Intervention Model SMART goals setting  Hamp Moreland G. Garnette CzechSampson MSW, [facility]WA 08/19/2015 11:21 AM

## 2015-08-19 NOTE — Plan of Care (Signed)
Problem: Role Relationship: Goal: Ability to interact with others will improve Outcome: Not Progressing Pt very loud, disorganized with slurred speech when interacting with others.

## 2015-08-20 MED ORDER — CLOZAPINE 25 MG PO TABS
150.0000 mg | ORAL_TABLET | Freq: Every day | ORAL | Status: DC
  Administered 2015-08-20: 150 mg via ORAL
  Administered 2015-08-21: 100 mg via ORAL
  Filled 2015-08-20 (×2): qty 2

## 2015-08-20 MED ORDER — BACITRACIN ZINC 500 UNIT/GM EX OINT
TOPICAL_OINTMENT | Freq: Every day | CUTANEOUS | Status: DC
  Administered 2015-08-20 – 2015-08-21 (×2): 1 via TOPICAL
  Administered 2015-08-22: 09:00:00 via TOPICAL
  Administered 2015-08-23 – 2015-08-29 (×6): 1 via TOPICAL
  Filled 2015-08-20 (×10): qty 0.9

## 2015-08-20 MED ORDER — DOCUSATE SODIUM 100 MG PO CAPS
200.0000 mg | ORAL_CAPSULE | Freq: Two times a day (BID) | ORAL | Status: DC
  Administered 2015-08-20 – 2015-09-01 (×24): 200 mg via ORAL
  Filled 2015-08-20 (×24): qty 2

## 2015-08-20 MED ORDER — POLYETHYLENE GLYCOL 3350 17 G PO PACK
17.0000 g | PACK | Freq: Every day | ORAL | Status: DC
  Administered 2015-08-22 – 2015-09-01 (×9): 17 g via ORAL
  Filled 2015-08-20 (×11): qty 1

## 2015-08-20 MED ORDER — SODIUM CHLORIDE FLUSH 0.9 % IV SOLN
INTRAVENOUS | Status: AC
  Filled 2015-08-20: qty 3

## 2015-08-20 NOTE — Tx Team (Addendum)
Interdisciplinary Treatment Plan Update (Adult)         Date: 08/20/2015   Time Reviewed: 10:30 AM   Progress in Treatment: Improving Attending groups: Yes  Participating in groups: Yes  Taking medication as prescribed: Yes  Tolerating medication: Yes  Family/Significant other contact made: CSW spoke with legal guardian Patient understands diagnosis: Yes  Discussing patient identified problems/goals with staff: Yes  Medical problems stabilized or resolved: Yes  Denies suicidal/homicidal ideation: Yes  Issues/concerns per patient self-inventory: Yes  Other:   New problem(s) identified: N/A   Discharge Plan or Barriers: see below   Reason for Continuation of Hospitalization:   Depression   Anxiety   Medication Stabilization   Comments: N/A   Estimated length of stay: 3-5 days    Patient is a 50 year old female admitted for aggressive behaviors with a diagnosis of schizophrenia. Patient lives in [place], [place]. Patient will benefit from crisis stabilization, medication evaluation, group therapy, and psycho education in addition to case management for discharge planning. Patient and CSW reviewed pt's identified goals and treatment plan. Pt verbalized understanding and agreed to treatment plan.    Review of initial/current patient goals per problem list:  1. Goal(s): Patient will participate in aftercare plan   Met: No  Target date: 3-5 days post admission date   As evidenced by: Patient will participate within aftercare plan AEB aftercare provider and housing plan at discharge being identified.   CSW assessing proper aftercare plans.  2. Goal (s): Patient will exhibit decreased depressive symptoms and suicidal ideations.   Met: Goal progressing   Target date: 3-5 days post admission date   As evidenced by: Patient will utilize self-rating of depression at 3 or below and demonstrate decreased signs of depression or be deemed stable for discharge by MD.   Pt  reports a depression score of 5 at this time. Denies SI/HI at this time.  3. Goal(s): Patient will demonstrate decreased signs and symptoms of anxiety.   Met: No  Target date: 3-5 days post admission date   As evidenced by: Patient will utilize self-rating of anxiety at 3 or below and demonstrated decreased signs of anxiety, or be deemed stable for discharge by MD   Pt reports an anxiety score of 5 at this time.   5. Goal(s): Patient will demonstrate decreased signs of psychosis  * Met: No * Target date: 3-5 days post admission date  * As evidenced by: Patient will demonstrate decreased frequency of AVH or return to baseline function   Pt is still psychotic at this time - AVH at this time.    Attendees:  Patient: Amy Villarreal  Family:  Physician: Merlyn Albert, MD    08/20/2015 10:30AM  Nursing: Elige Radon, RN     08/20/2015 10:30AM  Clinical Social Worker: Glorious Peach, [place]  08/20/2015 10:30AM  Other: Recreational Therapist, Everitt Amber  08/20/2015 10:30AM

## 2015-08-20 NOTE — [facility] Group Notes (Signed)
[facility] Group Notes:  (Nursing/MHT/Case Management/Adjunct)  Date:  08/20/2015  Time:  1:53 AM  Type of Therapy:  Evening Wrap-up Group  Participation Level:  Did Not Attend  Participation Quality:  N/A  Affect:  N/A  Cognitive:  N/A  Insight:  None  Engagement in Group:  Did Not Attend  Modes of Intervention:  Activity  Summary of Progress/Problems:  Tomasita MorrowChelsea Nanta Nautica Hotz 08/20/2015, 1:53 AM++++++++++++++++++++++++++++++++++++++++++++++++++++++++++++

## 2015-08-20 NOTE — Progress Notes (Signed)
Patient was sleepy & tired this morning.Got up for with sad affect.States "I am tired,I can not do anything.".Visible in the milieu in wheel chair.Denies suicidal and homicidal ideations.Compliant with medications.

## 2015-08-20 NOTE — Progress Notes (Signed)
D: Patient has been verbally aggressive. Very tangential. During the night she had multiple episodes of urine incontinence. Tearful after having the urine incontinence saying, "you all did this to me." She denies SI/HI/AVH. Patient is very impulsive. She did not attend group. A: Medication given with education. Encouragement provided. PRN Ativan, Haldol, and Benedryl given.  R: Patient would only take medication if pills were crushed up. Safety maintained with 15 min checks.

## 2015-08-20 NOTE — [facility] Group Notes (Signed)
[facility] LCSW Group Therapy  08/20/2015 11:12 AM  Type of Therapy:  Group Therapy  Participation Level:  Pt did not attend group. CSW invited pt to group.   Summary of Progress/Problems: Feelings around Relapse. Group members discussed the meaning of relapse and shared personal stories of relapse, how it affected them and others, and how they perceived themselves during this time. Group members were encouraged to identify triggers, warning signs and coping skills used when facing the possibility of relapse. Social supports were discussed and explored in detail. Patients also discussed facing disappointment and how that can trigger someone to relapse.    Conception Doebler G. Garnette CzechSampson MSW, LCSWA 08/20/2015, 11:13 AM

## 2015-08-20 NOTE — Progress Notes (Addendum)
[facility]  08/20/2015 1:08 PM Amy Villarreal  MRN:  [id number] Subjective: Amy R Burgessis a 50 y.o.femalehistory of schizoaffective disorder presented from group home as an IVC due to aggressive behavior and acute psychosis. While waiting for bed in the ER patient was aggressive and disruptive in the waiting room try to take her clothes off. Patient with flight of ideas in the ER and also was seeing interacting to internal stimuli. [place] [address]. Representative is [name] [phone]).\  Patient was less disorganized yesterday but this morning she was again grossly disorganized.  Lats night prn haldol, benadryl and ativan were given as she was verbally abusive to nurses.  This morning she was somewhat sedated stating that she could not move.  Later on she was seeing sitting on the wheelchair moving towards the day room. She has been taking her medications. Her oral intake has been within the normal limits. She is slept about 7 hours last night.  Per nursing: D: Patient has been verbally aggressive. Very tangential. During the night she had multiple episodes of urine incontinence. Tearful after having the urine incontinence saying, "you all did this to me." She denies SI/HI/AVH. Patient is very impulsive. She did not attend group. A: Medication given with education. Encouragement provided. PRN Ativan, Haldol, and Benedryl given.  R: Patient would only take medication if pills were crushed up. Safety maintained with 15 min checks.   Principal Problem: Schizophrenia (HCC) Diagnosis:   Patient Active Problem List   Diagnosis Date Noted  . Hypertension [I10] 08/17/2015  . Diabetes (HCC) [E11.9] 08/17/2015  . Gout [M10.9] 08/17/2015  . Schizophrenia (HCC) [F20.9] 06/26/2014   Total Time spent with patient: 30 minutes  Past Psychiatric History: Patient has a long history of schizophrenia resistant to multiple medications. Last time I saw her she  was taking clozapine and Risperdal together. Right now she is listed as only taking Haldol. I'm told that she had a recent hospitalization and has only been out of the hospital a short period of time. It's not clear whether she's ever seriously tried to hurt herself in the past although she has been aggressive and destructive of property when psychotic.  Past Medical History:  Past Medical History:  Diagnosis Date  . Anxiety   . Asthma   . Constipation   . DJD (degenerative joint disease)   . GERD (gastroesophageal reflux disease)   . Gout   . Hypertension   . Obesity   . Osteoporosis   . Schizoaffective disorder (HCC)   . Urinary incontinence     Past Surgical History:  Procedure Laterality Date  . ABDOMINAL HYSTERECTOMY     Family History: History reviewed. No pertinent family history.  Family Psychiatric  History: not known  Social History: Patient I believe has a guardian or at least a care giver looking after her. She has had multiple hospitalizations and multiple group homes but has lost position several times because of aggression. History  Alcohol Use  . Yes    Comment: "sometimes"     History  Drug Use No    Social History   Social History  . Marital status: Single    Spouse name: N/A  . Number of children: N/A  . Years of education: N/A   Social History Main Topics  . Smoking status: Current Some Day Smoker    Types: Cigarettes  . Smokeless tobacco: Never Used  . Alcohol use Yes  Comment: "sometimes"  . Drug use: No  . Sexual activity: No   Other Topics Concern  . None   Social History Narrative  . None   Additional Social History:    Pain Medications: see PTA meds Prescriptions: see PTA meds Over the Counter: see PTA meds History of alcohol / drug use?: No history of alcohol / drug abuse     Current Medications: Current Facility-Administered Medications  Medication Dose Route Frequency Provider Last Rate Last Dose  . acetaminophen  (TYLENOL) tablet 650 mg  650 mg Oral Q6H PRN Audery Amel, MD   650 mg at 08/19/15 1043  . allopurinol (ZYLOPRIM) tablet 50 mg  50 mg Oral BID Audery Amel, MD   50 mg at 08/20/15 1102  . alum & mag hydroxide-simeth (MAALOX/MYLANTA) 200-200-20 MG/5ML suspension 30 mL  30 mL Oral Q4H PRN Audery Amel, MD      . amantadine (SYMMETREL) solution 100 mg  100 mg Oral BID Jimmy Footman, MD   100 mg at 08/20/15 1104  . aspirin EC tablet 81 mg  81 mg Oral Daily Audery Amel, MD   81 mg at 08/20/15 1103  . cloZAPine (CLOZARIL) tablet 150 mg  150 mg Oral QHS Jimmy Footman, MD      . diphenhydrAMINE (BENADRYL) capsule 50 mg  50 mg Oral Q6H PRN Jimmy Footman, MD   50 mg at 08/19/15 2028   Or  . diphenhydrAMINE (BENADRYL) injection 50 mg  50 mg Intramuscular Q6H PRN Jimmy Footman, MD      . haloperidol (HALDOL) tablet 10 mg  10 mg Oral BID Jimmy Footman, MD   10 mg at 08/20/15 1103  . haloperidol (HALDOL) tablet 5 mg  5 mg Oral Q6H PRN Jimmy Footman, MD   5 mg at 08/19/15 2029   Or  . haloperidol lactate (HALDOL) injection 5 mg  5 mg Intramuscular Q6H PRN Jimmy Footman, MD      . lisinopril (PRINIVIL,ZESTRIL) tablet 5 mg  5 mg Oral Daily Audery Amel, MD   5 mg at 08/20/15 1103  . LORazepam (ATIVAN) tablet 2 mg  2 mg Oral Q6H PRN Jimmy Footman, MD   2 mg at 08/19/15 2029   Or  . LORazepam (ATIVAN) injection 2 mg  2 mg Intramuscular Q6H PRN Jimmy Footman, MD      . magnesium hydroxide (MILK OF MAGNESIA) suspension 30 mL  30 mL Oral Daily PRN Audery Amel, MD      . metFORMIN (GLUCOPHAGE) tablet 500 mg  500 mg Oral Q breakfast Audery Amel, MD   500 mg at 08/20/15 1102  . nicotine (NICODERM CQ - dosed in mg/24 hours) patch 21 mg  21 mg Transdermal Daily Jolanta B Pucilowska, MD   21 mg at 08/20/15 1100  . pantoprazole (PROTONIX) EC tablet 40 mg  40 mg Oral Daily Audery Amel, MD   40 mg at  08/20/15 1103  . polyethylene glycol (MIRALAX / GLYCOLAX) packet 17 g  17 g Oral Daily PRN Audery Amel, MD   17 g at 08/20/15 1100  . senna-docusate (Senokot-S) tablet 2 tablet  2 tablet Oral BID Audery Amel, MD   2 tablet at 08/20/15 1102  . sulfamethoxazole-trimethoprim (BACTRIM DS,SEPTRA DS) 800-160 MG per tablet 1 tablet  1 tablet Oral Q12H Audery Amel, MD   1 tablet at 08/20/15 1102    Lab Results:  Results for orders placed or performed during the hospital  encounter of 08/17/15 (from the past 48 hour(s))  CBC with Differential/Platelet     Status: Abnormal   Collection Time: 08/18/15  4:06 PM  Result Value Ref Range   WBC 15.2 (H) 3.6 - 11.0 K/uL   RBC 4.17 3.80 - 5.20 MIL/uL   Hemoglobin 11.8 (L) 12.0 - 16.0 g/dL   HCT 58.8 50.2 - 77.4 %   MCV 85.7 80.0 - 100.0 fL   MCH 28.3 26.0 - 34.0 pg   MCHC 33.0 32.0 - 36.0 g/dL   RDW 12.8 (H) 78.6 - 76.7 %   Platelets 462 (H) 150 - 440 K/uL   Neutrophils Relative % 70 %   Neutro Abs 10.7 (H) 1.4 - 6.5 K/uL   Lymphocytes Relative 23 %   Lymphs Abs 3.5 1.0 - 3.6 K/uL   Monocytes Relative 5 %   Monocytes Absolute 0.8 0.2 - 0.9 K/uL   Eosinophils Relative 1 %   Eosinophils Absolute 0.2 0 - 0.7 K/uL   Basophils Relative 1 %   Basophils Absolute 0.1 0 - 0.1 K/uL    Blood Alcohol level:  Lab Results  Component Value Date   ETH <5 08/16/2015   ETH <5 09/19/2014    Metabolic Disorder Labs: Lab Results  Component Value Date   HGBA1C 6.5 (H) 08/18/2015   Lab Results  Component Value Date   PROLACTIN 50.7 (H) 08/18/2015   Lab Results  Component Value Date   CHOL 179 08/18/2015   TRIG 103 08/18/2015   HDL 36 (L) 08/18/2015   CHOLHDL 5.0 08/18/2015   VLDL 21 08/18/2015   LDLCALC 122 (H) 08/18/2015    Physical Findings: AIMS: Facial and Oral Movements Muscles of Facial Expression: None, normal Lips and Perioral Area: None, normal Jaw: None, normal Tongue: None, normal,Extremity Movements Upper (arms, wrists,  hands, fingers): None, normal Lower (legs, knees, ankles, toes): None, normal, Trunk Movements Neck, shoulders, hips: None, normal, Overall Severity Severity of abnormal movements (highest score from questions above): None, normal Incapacitation due to abnormal movements: None, normal Patient's awareness of abnormal movements (rate only patient's report): No Awareness, Dental Status Current problems with teeth and/or dentures?: No Does patient usually wear dentures?: No  CIWA:    COWS:     Musculoskeletal: Strength & Muscle Tone: within normal limits Gait & Station: normal Patient leans: N/A  Psychiatric Specialty Exam: Physical Exam  Constitutional: She appears well-developed and well-nourished.  HENT:  Head: Normocephalic and atraumatic.  Eyes: EOM are normal.  Neck: Normal range of motion.  Respiratory: Effort normal.  Neurological: She is alert.    Review of Systems  Unable to perform ROS: Acuity of condition    Blood pressure 93/64, pulse (!) 102, temperature 98 F (36.7 C), temperature source Oral, resp. rate 16, height 5\' 9"  (1.753 m), weight 118.4 kg (261 lb), SpO2 100 %.Body mass index is 38.54 kg/m.  General Appearance: Fairly Groomed  Eye Contact:  Fair  Speech:  Pressured  Volume:  Increased  Mood:  Anxious  Affect:  Congruent  Thought Process:  Disorganized, Irrelevant and Descriptions of Associations: Loose  Orientation:  NA  Thought Content:  Illogical  Suicidal Thoughts:  N/a  Homicidal Thoughts:  N/A  Memory:  NA  Judgement:  Impaired  Insight:  Lacking  Psychomotor Activity:  Increased  Concentration:  Concentration: Poor and Attention Span: Poor  Recall:  Poor  Fund of Knowledge:  Poor  Language:  Poor  Akathisia:  No  Handed:    AIMS (if  indicated):     Assets:  Financial Resources/Insurance Social Support  ADL's:  Intact  Cognition:  Impaired,  Moderate  Sleep:  Number of Hours: 5.15     Treatment Plan Summary:  Schizophrenia:  continue haldol 10 mg twice a day. Patient has been restarted on Clozaril. Will increase dose to 150 mg po qhs.  Please continue to titarate up the clozaril by 50 mg q day---Her dose prior to admission was 350 mg. Plan to taper off haldol once clozaril dose becomes therapeutic.  EPS: no evidence of EPS, will continue amantadine for now.  Agitation and aggression the patient will be started on Haldol 5 mg every 6 hours, Ativan 2 mg every 6 hours and Benadryl every 6 hours as needed for aggression, agitation  HTN: continue lisinopril 5 mg po q day  Metabolic syndrome: continue metformin 500 mg po q am  GERD: continue protonix 40 mg  Gout: continue allopurinol  Constipation: continue miralax daily and will start colace 200 mg po bid  Abscess: Per ER notes: exam does show evidence of new right inframammary superficial abscess that was drained on 8/14 while in the ER.  Now on Bactrim. Will need daily dressing change  Urine incontinence: will check UA and urine culture  Diet pt has been complaining about her diet---will change to regular  Precautions: q 15 m checks  Unsteady gait: continue using rolling walker and will start fall precautions.  OK for pt to use wheelchair as needed  Hospital status: IVC  Labs: ordered Hba1c, lipid panel, prolactin, TSH  Disposition: Will need to start looking for new placement.  Follow-up: To be determined     [name],  Lemarcus Baggerly, MD 08/20/2015, 1:08 PM

## 2015-08-20 NOTE — [facility] Group Notes (Signed)
[facility] Group Notes:  (Nursing/MHT/Case Management/Adjunct)  Date:  08/20/2015  Time:  4:20 PM  Type of Therapy:  Psychoeducational Skills  Participation Level:  Did Not Attend  Twanna Hymanda C Berma Harts 08/20/2015, 4:20 PM

## 2015-08-20 NOTE — Progress Notes (Signed)
Recreation Therapy Notes  Date: 08.18.17 Time: 1:00 pm Location: Craft Room  Group Topic: Self-expression, Coping Skills  Goal Area(s) Addresses:  Patient will effectively use art as a means of self-expression. Patient will recognize positive benefit of self-expression. Patient will be able to identify one emotion experienced during group session. Patient will identify use of art as a coping skill.  Behavioral Response: Did not attend  Intervention: Two Faces of Me  Activity: Patients were given a blank face worksheet and instructed to draw a line down the middle. On one side of the face, patients were instructed to draw or write how they felt when they were admitted to the hospital. On the other side of the face, patients were instructed to draw or write how they want to feel when they are d/c.  Education: LRT educated patients on healthy coping skills.  Education Outcome: Patient did not attend group.   Clinical Observations/Feedback: Patient did not attend group.  Selim Durden M, LRT/CTRS 08/20/2015 2:58 PM 

## 2015-08-21 DIAGNOSIS — F200 Paranoid schizophrenia: Secondary | ICD-10-CM

## 2015-08-21 LAB — URINALYSIS COMPLETE WITH MICROSCOPIC (ARMC ONLY)
BILIRUBIN URINE: NEGATIVE
Bacteria, UA: NONE SEEN
Glucose, UA: NEGATIVE mg/dL
Hgb urine dipstick: NEGATIVE
KETONES UR: NEGATIVE mg/dL
LEUKOCYTES UA: NEGATIVE
Nitrite: NEGATIVE
PH: 5 (ref 5.0–8.0)
Protein, ur: NEGATIVE mg/dL
SPECIFIC GRAVITY, URINE: 1.018 (ref 1.005–1.030)

## 2015-08-21 NOTE — Progress Notes (Signed)
Patient verbalized that she feels so tired and she can not do anything.Patient did ADLs without assistance.Uses walker & wheelchair for ambulation.Denies suicidal and homicidal ideations.Compliant with medications.

## 2015-08-21 NOTE — Progress Notes (Signed)
D: Patient is alert and oriented to person on the unit this shift. Patient attended and actively participated in groups today. Patient denies suicidal ideation, homicidal ideation, auditory or visual hallucinations at the present time.  A: Scheduled medications are administered to patient as per MD orders. Emotional support and encouragement are provided. Patient is maintained on q.15 minute safety checks. Patient is informed to notify staff with questions or concerns. R: No adverse medication reactions are noted. Patient is cooperative with medication administration  . Patient is non  Receptive, anxious  ,impulsive and cooperative on the unit at this time. Patient does not interact with others on the unit this shift. Patient contracts for safety at this time. Patient remains safe at this time.

## 2015-08-21 NOTE — [facility] Group Notes (Signed)
[facility] LCSW Group Therapy  08/21/2015 2:42 PM  Type of Therapy:  Group Therapy  Participation Level:  Pt did not attend group. CSW invited pt to group.   Summary of Progress/Problems:Self esteem: Patients discussed self esteem and how it impacts them. They discussed what aspects in their lives has influenced their self esteem. They were challenged to identify changes that are needed in order to improve self esteem. Patients participated in activity where they had to identify positive adjectives they felt described their personality. Patients shared with the group on the following areas: Things I am good at, What I like about my appearance, I've helped others by, What I value the most, compliments I have received, challenges I have overcome, thing that make me unique, and Times I've made others happy.    Reina Wilton G. Garnette CzechSampson MSW, LCSWA 08/21/2015, 2:42 PM

## 2015-08-21 NOTE — Progress Notes (Signed)
[facility] MD Progress Note  08/21/2015 3:35 PM Amy Villarreal  MRN:  [id number]   Subjective: Amy R Burgessis a 50 y.o.femalehistory of schizoaffective disorder presented from group home as an IVC due to aggressive behavior and acute psychosis. While waiting for bed in the ER patient was aggressive and disruptive in the waiting room try to take her clothes off. Patient with flight of ideas in the ER and also was seeing interacting to internal stimuli. The [facility] [address] have [address]. Representative is Barbarann Ehlers [phone]).  Patient reported that she is feeling hungry today, and was less disorganized than yesterday.She has been taking her medications. Her oral intake has been within the normal limits. She is slept about 7 hours last night.  Per nursing: D: Patient has been verbally aggressive. Very tangential. During the night she had multiple episodes of urine incontinence. Tearful after having the urine incontinence saying, "you all did this to me." She denies SI/HI/AVH. Patient is very impulsive. She did not attend group. A: Medication given with education. Encouragement provided. PRN Ativan, Haldol, and Benedryl given.  R: Patient would only take medication if pills were crushed up. Safety maintained with 15 min checks.   Principal Problem: Schizophrenia (HCC) Diagnosis:   Patient Active Problem List   Diagnosis Date Noted  . Hypertension [I10] 08/17/2015  . Diabetes (HCC) [E11.9] 08/17/2015  . Gout [M10.9] 08/17/2015  . Schizophrenia (HCC) [F20.9] 06/26/2014   Total Time spent with patient: 30 minutes  Past Psychiatric History: Patient has a long history of schizophrenia resistant to multiple medications. Last time I saw her she was taking clozapine and Risperdal together. Right now she is listed as only taking Haldol. I'm told that she had a recent hospitalization and has only been out of the hospital a short period of time. It's not clear whether she's ever seriously  tried to hurt herself in the past although she has been aggressive and destructive of property when psychotic.  Past Medical History:  Past Medical History:  Diagnosis Date  . Anxiety   . Asthma   . Constipation   . DJD (degenerative joint disease)   . GERD (gastroesophageal reflux disease)   . Gout   . Hypertension   . Obesity   . Osteoporosis   . Schizoaffective disorder (HCC)   . Urinary incontinence     Past Surgical History:  Procedure Laterality Date  . ABDOMINAL HYSTERECTOMY     Family History: History reviewed. No pertinent family history.  Family Psychiatric  History: not known  Social History: Patient I believe has a guardian or at least a care giver looking after her. She has had multiple hospitalizations and multiple group homes but has lost position several times because of aggression. History  Alcohol Use  . Yes    Comment: "sometimes"     History  Drug Use No    Social History   Social History  . Marital status: Single    Spouse name: N/A  . Number of children: N/A  . Years of education: N/A   Social History Main Topics  . Smoking status: Current Some Day Smoker    Types: Cigarettes  . Smokeless tobacco: Never Used  . Alcohol use Yes     Comment: "sometimes"  . Drug use: No  . Sexual activity: No   Other Topics Concern  . None   Social History Narrative  . None   Additional Social History:    Pain Medications: see PTA meds Prescriptions: see  PTA meds Over the Counter: see PTA meds History of alcohol / drug use?: No history of alcohol / drug abuse     Current Medications: Current Facility-Administered Medications  Medication Dose Route Frequency Provider Last Rate Last Dose  . acetaminophen (TYLENOL) tablet 650 mg  650 mg Oral Q6H PRN Audery AmelJohn T Clapacs, MD   650 mg at 08/21/15 0936  . allopurinol (ZYLOPRIM) tablet 50 mg  50 mg Oral BID Audery AmelJohn T Clapacs, MD   50 mg at 08/21/15 0904  . alum & mag hydroxide-simeth (MAALOX/MYLANTA)  200-200-20 MG/5ML suspension 30 mL  30 mL Oral Q4H PRN Audery AmelJohn T Clapacs, MD      . amantadine (SYMMETREL) solution 100 mg  100 mg Oral BID Jimmy FootmanAndrea Hernandez-Gonzalez, MD   100 mg at 08/21/15 0907  . aspirin EC tablet 81 mg  81 mg Oral Daily Audery AmelJohn T Clapacs, MD   81 mg at 08/21/15 [phone]  . bacitracin ointment   Topical Daily Jimmy FootmanAndrea Hernandez-Gonzalez, MD   1 application at 08/21/15 [phone]  . cloZAPine (CLOZARIL) tablet 150 mg  150 mg Oral QHS Jimmy FootmanAndrea Hernandez-Gonzalez, MD   150 mg at 08/20/15 2205  . diphenhydrAMINE (BENADRYL) capsule 50 mg  50 mg Oral Q6H PRN Jimmy FootmanAndrea Hernandez-Gonzalez, MD   50 mg at 08/19/15 2028   Or  . diphenhydrAMINE (BENADRYL) injection 50 mg  50 mg Intramuscular Q6H PRN Jimmy FootmanAndrea Hernandez-Gonzalez, MD      . docusate sodium (COLACE) capsule 200 mg  200 mg Oral BID Jimmy FootmanAndrea Hernandez-Gonzalez, MD   200 mg at 08/20/15 2205  . haloperidol (HALDOL) tablet 10 mg  10 mg Oral BID Jimmy FootmanAndrea Hernandez-Gonzalez, MD   10 mg at 08/21/15 [id number]  . haloperidol (HALDOL) tablet 5 mg  5 mg Oral Q6H PRN Jimmy FootmanAndrea Hernandez-Gonzalez, MD   5 mg at 08/19/15 2029   Or  . haloperidol lactate (HALDOL) injection 5 mg  5 mg Intramuscular Q6H PRN Jimmy FootmanAndrea Hernandez-Gonzalez, MD      . lisinopril (PRINIVIL,ZESTRIL) tablet 5 mg  5 mg Oral Daily Audery AmelJohn T Clapacs, MD   5 mg at 08/21/15 0902  . LORazepam (ATIVAN) tablet 2 mg  2 mg Oral Q6H PRN Jimmy FootmanAndrea Hernandez-Gonzalez, MD   2 mg at 08/19/15 2029   Or  . LORazepam (ATIVAN) injection 2 mg  2 mg Intramuscular Q6H PRN Jimmy FootmanAndrea Hernandez-Gonzalez, MD      . magnesium hydroxide (MILK OF MAGNESIA) suspension 30 mL  30 mL Oral Daily PRN Audery AmelJohn T Clapacs, MD      . metFORMIN (GLUCOPHAGE) tablet 500 mg  500 mg Oral Q breakfast Audery AmelJohn T Clapacs, MD   500 mg at 08/21/15 0859  . nicotine (NICODERM CQ - dosed in mg/24 hours) patch 21 mg  21 mg Transdermal Daily Jolanta B Pucilowska, MD   21 mg at 08/20/15 1100  . pantoprazole (PROTONIX) EC tablet 40 mg  40 mg Oral Daily Audery AmelJohn T Clapacs, MD   40 mg  at 08/21/15 0903  . polyethylene glycol (MIRALAX / GLYCOLAX) packet 17 g  17 g Oral Daily Jimmy FootmanAndrea Hernandez-Gonzalez, MD      . sulfamethoxazole-trimethoprim (BACTRIM DS,SEPTRA DS) 800-160 MG per tablet 1 tablet  1 tablet Oral Q12H Audery AmelJohn T Clapacs, MD   1 tablet at 08/21/15 [id number]    Lab Results:  Results for orders placed or performed during the hospital encounter of 08/17/15 (from the past 48 hour(s))  Urinalysis complete, with microscopic [facility] only)     Status: Abnormal   Collection Time: 08/20/15  7:54 AM  Result Value Ref Range   Color, Urine YELLOW (A) YELLOW   APPearance CLEAR (A) CLEAR   Glucose, UA NEGATIVE NEGATIVE mg/dL   Bilirubin Urine NEGATIVE NEGATIVE   Ketones, ur NEGATIVE NEGATIVE mg/dL   Specific Gravity, Urine 1.018 1.005 - 1.030   Hgb urine dipstick NEGATIVE NEGATIVE   pH 5.0 5.0 - 8.0   Protein, ur NEGATIVE NEGATIVE mg/dL   Nitrite NEGATIVE NEGATIVE   Leukocytes, UA NEGATIVE NEGATIVE   RBC / HPF 0-5 0 - 5 RBC/hpf   WBC, UA 0-5 0 - 5 WBC/hpf   Bacteria, UA NONE SEEN NONE SEEN   Squamous Epithelial / LPF 0-5 (A) NONE SEEN    Blood Alcohol level:  Lab Results  Component Value Date   [facility] <5 08/16/2015   ETH <5 09/19/2014    Metabolic Disorder Labs: Lab Results  Component Value Date   HGBA1C 6.5 (H) 08/18/2015   Lab Results  Component Value Date   PROLACTIN 50.7 (H) 08/18/2015   Lab Results  Component Value Date   CHOL 179 08/18/2015   TRIG 103 08/18/2015   HDL 36 (L) 08/18/2015   CHOLHDL 5.0 08/18/2015   VLDL 21 08/18/2015   LDLCALC 122 (H) 08/18/2015    Physical Findings: AIMS: Facial and Oral Movements Muscles of Facial Expression: None, normal Lips and Perioral Area: None, normal Jaw: None, normal Tongue: None, normal,Extremity Movements Upper (arms, wrists, hands, fingers): None, normal Lower (legs, knees, ankles, toes): None, normal, Trunk Movements Neck, shoulders, hips: None, normal, Overall Severity Severity of abnormal movements  (highest score from questions above): None, normal Incapacitation due to abnormal movements: None, normal Patient's awareness of abnormal movements (rate only patient's report): No Awareness, Dental Status Current problems with teeth and/or dentures?: No Does patient usually wear dentures?: No  CIWA:    COWS:     Musculoskeletal: Strength & Muscle Tone: within normal limits Gait & Station: normal Patient leans: N/A  Psychiatric Specialty Exam: Physical Exam  Constitutional: She appears well-developed and well-nourished.  HENT:  Head: Normocephalic and atraumatic.  Eyes: EOM are normal.  Neck: Normal range of motion.  Respiratory: Effort normal.  Neurological: She is alert.    Review of Systems  Unable to perform ROS: Acuity of condition    Blood pressure (!) 103/56, pulse (!) 104, temperature 97.7 F (36.5 C), temperature source Oral, resp. rate 16, height 5\' 9"  (1.753 m), weight 261 lb (118.4 kg), SpO2 100 %.Body mass index is 38.54 kg/m.  General Appearance: Fairly Groomed  Eye Contact:  Fair  Speech:  Pressured  Volume:  Increased  Mood:  Anxious  Affect:  Congruent  Thought Process:  Disorganized, Irrelevant and Descriptions of Associations: Loose  Orientation:  NA  Thought Content:  Illogical  Suicidal Thoughts:  N/a  Homicidal Thoughts:  N/A  Memory:  NA  Judgement:  Impaired  Insight:  Lacking  Psychomotor Activity:  Increased  Concentration:  Concentration: Poor and Attention Span: Poor  Recall:  Poor  Fund of Knowledge:  Poor  Language:  Poor  Akathisia:  No  Handed:    AIMS (if indicated):     Assets:  Financial Resources/Insurance Social Support  ADL's:  Intact  Cognition:  Impaired,  Moderate  Sleep:  Number of Hours: 7.3     Treatment Plan Summary:  Schizophrenia: continue haldol 10 mg twice a day. Patient has been restarted on Clozaril. Will increase dose to 150 mg po qhs.  Please continue to titarate up the  clozaril by 50 mg q day---Her dose  prior to admission was 350 mg. Plan to taper off haldol once clozaril dose becomes therapeutic.  EPS: no evidence of EPS, will continue amantadine for now.  Agitation and aggression the patient will be started on Haldol 5 mg every 6 hours, Ativan 2 mg every 6 hours and Benadryl every 6 hours as needed for aggression, agitation  HTN: continue lisinopril 5 mg po q day  Metabolic syndrome: continue metformin 500 mg po q am  GERD: continue protonix 40 mg  Gout: continue allopurinol  Constipation: continue miralax daily and will start colace 200 mg po bid  Abscess: Per ER notes: exam does show evidence of new right inframammary superficial abscess that was drained on 8/14 while in the ER.  Now on Bactrim. Will need daily dressing change  Urine incontinence: will check UA and urine culture  Diet pt has been complaining about her diet---will change to regular  Precautions: q 15 m checks  Unsteady gait: continue using rolling walker and will start fall precautions.  OK for pt to use wheelchair as needed  Hospital status: IVC  Labs: ordered Hba1c, lipid panel, prolactin, TSH  Disposition: Will need to start looking for new placement.  Follow-up: To be determined     Brandy HaleUzma Sia Gabrielsen, MD 08/21/2015, 3:35 PM

## 2015-08-21 NOTE — [facility] Group Notes (Signed)
[facility] Group Notes:  (Nursing/MHT/Case Management/Adjunct)  Date:  08/21/2015  Time:  3:44 AM  Type of Therapy:  Psychoeducational Skills  Participation Level:  Did Not Attend  Summary of Progress/Problems:  Amy MilroyLaquanda Y Jerzee Villarreal 08/21/2015, 3:44 AM

## 2015-08-21 NOTE — Plan of Care (Signed)
Problem: Coping: Goal: Ability to demonstrate self-control will improve Outcome: Not Progressing Pt not able to demonstrate self control at this time very impulsive at this time [facility]

## 2015-08-21 NOTE — Progress Notes (Signed)
D: Patient is alert but groggy on the unit this shift. Patient  Not attended and   participated in groups today. Patient denies suicidal ideation, homicidal ideation, auditory or visual hallucinations at the present time.  A: Scheduled medications are administered to patient as per MD orders. Emotional support and encouragement are provided. Patient is maintained on q.15 minute safety checks. Patient is informed to notify staff with questions or concerns. R: No adverse medication reactions are noted. Patient is cooperative with medication administration . Patient is non receptive,  Angry and not  cooperative on the unit at this time. Patient does not  Interact  with others on the unit this shift. Patient contracts for safety at this time. Patient remains safe at this time.

## 2015-08-21 NOTE — Plan of Care (Signed)
Problem: Coping: Goal: Ability to verbalize frustrations and anger appropriately will improve Outcome: Not Progressing Patient verbalizes anger not frustrations , not coping  [place] RN

## 2015-08-22 LAB — URINE CULTURE

## 2015-08-22 MED ORDER — CLOZAPINE 100 MG PO TABS
200.0000 mg | ORAL_TABLET | Freq: Every day | ORAL | Status: DC
  Administered 2015-08-22: 200 mg via ORAL
  Filled 2015-08-22: qty 2

## 2015-08-22 NOTE — Progress Notes (Signed)
D: Patient alert on unit. Patient denies SI/HI/AVH. Patient is using wheelchair for mobility. Patient has been loud in hallway demanding phone to be turned back on before time. Patient was redirected by staff.  A: Staff to monitor Q 15 mins for safety. Encouragement and support offered. Scheduled medications administered per orders. R: Patient remains safe on the unit. Patient visible on the unit and interacting with peers. Patient taking administered medications.

## 2015-08-22 NOTE — Plan of Care (Signed)
Problem: Coping: Goal: Ability to verbalize frustrations and anger appropriately will improve Outcome: Not Progressing Patient intrusive and demanding. Does not accept when limits are set. Does not accept hospital guidelines and yells at staff.

## 2015-08-22 NOTE — Progress Notes (Signed)
[facility]  08/22/2015 11:23 AM Amy Villarreal  MRN:  [id number]   Subjective: Amy R Burgessis a 50 y.o.femalehistory of schizoaffective disorder presented from group home as an IVC due to aggressive behavior and acute psychosis. [facility] [address] [place] [address] Representative is [name] [phone]).  Patient reported that she is having pain in her back and her legs this morning. She appeared tired and depressed. She reported that she has been taking her medications but they are not helpful. The staff also reported that her blood pressure is low this morning and they have helped her medications. feeling hungry today, and was less disorganized than yesterday.She has been taking her medications. Her oral intake has been within the normal limits. She is slept about 7 hours last night.  Per nursing: D: Patient has been verbally aggressive. Very tangential. During the night she had multiple episodes of urine incontinence. Tearful after having the urine incontinence saying, "you all did this to me." She denies SI/HI/AVH. Patient is very impulsive. She did not attend group. A: Medication given with education. Encouragement provided. PRN Ativan, Haldol, and Benedryl given.  R: Patient would only take medication if pills were crushed up. Safety maintained with 15 min checks.   Principal Problem: Schizophrenia (HCC) Diagnosis:   Patient Active Problem List   Diagnosis Date Noted  . Hypertension [I10] 08/17/2015  . Diabetes (HCC) [E11.9] 08/17/2015  . Gout [M10.9] 08/17/2015  . Schizophrenia (HCC) [F20.9] 06/26/2014   Total Time spent with patient: 30 minutes  Past Psychiatric History: Patient has a long history of schizophrenia resistant to multiple medications. Last time I saw her she was taking clozapine and Risperdal together. Right now she is listed as only taking Haldol. I'm told that she had a recent hospitalization and has only been out of the hospital a short  period of time. It's not clear whether she's ever seriously tried to hurt herself in the past although she has been aggressive and destructive of property when psychotic.  Past Medical History:  Past Medical History:  Diagnosis Date  . Anxiety   . Asthma   . Constipation   . DJD (degenerative joint disease)   . GERD (gastroesophageal reflux disease)   . Gout   . Hypertension   . Obesity   . Osteoporosis   . Schizoaffective disorder (HCC)   . Urinary incontinence     Past Surgical History:  Procedure Laterality Date  . ABDOMINAL HYSTERECTOMY     Family History: History reviewed. No pertinent family history.  Family Psychiatric  History: not known  Social History: Patient I believe has a guardian or at least a care giver looking after her. She has had multiple hospitalizations and multiple group homes but has lost position several times because of aggression. History  Alcohol Use  . Yes    Comment: "sometimes"     History  Drug Use No    Social History   Social History  . Marital status: Single    Spouse name: N/A  . Number of children: N/A  . Years of education: N/A   Social History Main Topics  . Smoking status: Current Some Day Smoker    Types: Cigarettes  . Smokeless tobacco: Never Used  . Alcohol use Yes     Comment: "sometimes"  . Drug use: No  . Sexual activity: No   Other Topics Concern  . None   Social History Narrative  . None   Additional Social History:  Pain Medications: see PTA meds Prescriptions: see PTA meds Over the Counter: see PTA meds History of alcohol / drug use?: No history of alcohol / drug abuse     Current Medications: Current Facility-Administered Medications  Medication Dose Route Frequency Provider Last Rate Last Dose  . acetaminophen (TYLENOL) tablet 650 mg  650 mg Oral Q6H PRN Audery Amel, MD   650 mg at 08/21/15 0936  . allopurinol (ZYLOPRIM) tablet 50 mg  50 mg Oral BID Audery Amel, MD   50 mg at 08/22/15  0856  . alum & mag hydroxide-simeth (MAALOX/MYLANTA) 200-200-20 MG/5ML suspension 30 mL  30 mL Oral Q4H PRN Audery Amel, MD      . amantadine (SYMMETREL) solution 100 mg  100 mg Oral BID Jimmy Footman, MD   100 mg at 08/22/15 1610  . aspirin EC tablet 81 mg  81 mg Oral Daily Audery Amel, MD   81 mg at 08/22/15 0859  . bacitracin ointment   Topical Daily Jimmy Footman, MD      . cloZAPine (CLOZARIL) tablet 150 mg  150 mg Oral QHS Jimmy Footman, MD   100 mg at 08/21/15 2015  . diphenhydrAMINE (BENADRYL) capsule 50 mg  50 mg Oral Q6H PRN Jimmy Footman, MD   50 mg at 08/19/15 2028   Or  . diphenhydrAMINE (BENADRYL) injection 50 mg  50 mg Intramuscular Q6H PRN Jimmy Footman, MD      . docusate sodium (COLACE) capsule 200 mg  200 mg Oral BID Jimmy Footman, MD   200 mg at 08/22/15 0901  . haloperidol (HALDOL) tablet 10 mg  10 mg Oral BID Jimmy Footman, MD   10 mg at 08/22/15 0857  . haloperidol (HALDOL) tablet 5 mg  5 mg Oral Q6H PRN Jimmy Footman, MD   5 mg at 08/21/15 2010   Or  . haloperidol lactate (HALDOL) injection 5 mg  5 mg Intramuscular Q6H PRN Jimmy Footman, MD      . lisinopril (PRINIVIL,ZESTRIL) tablet 5 mg  5 mg Oral Daily Audery Amel, MD   5 mg at 08/21/15 0902  . LORazepam (ATIVAN) tablet 2 mg  2 mg Oral Q6H PRN Jimmy Footman, MD   2 mg at 08/21/15 2011   Or  . LORazepam (ATIVAN) injection 2 mg  2 mg Intramuscular Q6H PRN Jimmy Footman, MD      . magnesium hydroxide (MILK OF MAGNESIA) suspension 30 mL  30 mL Oral Daily PRN Audery Amel, MD      . metFORMIN (GLUCOPHAGE) tablet 500 mg  500 mg Oral Q breakfast Audery Amel, MD   500 mg at 08/22/15 0859  . nicotine (NICODERM CQ - dosed in mg/24 hours) patch 21 mg  21 mg Transdermal Daily Jolanta B Pucilowska, MD   21 mg at 08/22/15 0850  . pantoprazole (PROTONIX) EC tablet 40 mg  40 mg Oral Daily  Audery Amel, MD   40 mg at 08/22/15 0855  . polyethylene glycol (MIRALAX / GLYCOLAX) packet 17 g  17 g Oral Daily Jimmy Footman, MD   17 g at 08/22/15 0900  . sulfamethoxazole-trimethoprim (BACTRIM DS,SEPTRA DS) 800-160 MG per tablet 1 tablet  1 tablet Oral Q12H Audery Amel, MD   1 tablet at 08/22/15 9604    Lab Results:  No results found for this or any previous visit (from the past 48 hour(s)).  Blood Alcohol level:  Lab Results  Component Value Date   ETH <5  08/16/2015   ETH <5 09/19/2014    Metabolic Disorder Labs: Lab Results  Component Value Date   HGBA1C 6.5 (H) 08/18/2015   Lab Results  Component Value Date   PROLACTIN 50.7 (H) 08/18/2015   Lab Results  Component Value Date   CHOL 179 08/18/2015   TRIG 103 08/18/2015   HDL 36 (L) 08/18/2015   CHOLHDL 5.0 08/18/2015   VLDL 21 08/18/2015   LDLCALC 122 (H) 08/18/2015    Physical Findings: AIMS: Facial and Oral Movements Muscles of Facial Expression: None, normal Lips and Perioral Area: None, normal Jaw: None, normal Tongue: None, normal,Extremity Movements Upper (arms, wrists, hands, fingers): None, normal Lower (legs, knees, ankles, toes): None, normal, Trunk Movements Neck, shoulders, hips: None, normal, Overall Severity Severity of abnormal movements (highest score from questions above): None, normal Incapacitation due to abnormal movements: None, normal Patient's awareness of abnormal movements (rate only patient's report): No Awareness, Dental Status Current problems with teeth and/or dentures?: No Does patient usually wear dentures?: No  CIWA:    COWS:     Musculoskeletal: Strength & Muscle Tone: within normal limits Gait & Station: normal Patient leans: N/A  Psychiatric Specialty Exam: Physical Exam  Constitutional: She appears well-developed and well-nourished.  HENT:  Head: Normocephalic and atraumatic.  Eyes: EOM are normal.  Neck: Normal range of motion.  Respiratory:  Effort normal.  Neurological: She is alert.    Review of Systems  Unable to perform ROS: Acuity of condition    Blood pressure (S) (!) 128/50, pulse (S) 98, temperature 97.7 F (36.5 C), temperature source Oral, resp. rate 16, height 5\' 9"  (1.753 m), weight 261 lb (118.4 kg), SpO2 100 %.Body mass index is 38.54 kg/m.  General Appearance: Fairly Groomed  Eye Contact:  Fair  Speech:  Pressured  Volume:  Increased  Mood:  Anxious  Affect:  Congruent  Thought Process:  Disorganized, Irrelevant and Descriptions of Associations: Loose  Orientation:  NA  Thought Content:  Illogical  Suicidal Thoughts:  N/a  Homicidal Thoughts:  N/A  Memory:  NA  Judgement:  Impaired  Insight:  Lacking  Psychomotor Activity:  Increased  Concentration:  Concentration: Poor and Attention Span: Poor  Recall:  Poor  Fund of Knowledge:  Poor  Language:  Poor  Akathisia:  No  Handed:    AIMS (if indicated):     Assets:  Financial Resources/Insurance Social Support  ADL's:  Intact  Cognition:  Impaired,  Moderate  Sleep:  Number of Hours: 7.3     Treatment Plan Summary:  Schizophrenia: continue haldol 10 mg twice a day. Patient has been restarted on Clozaril. Will increase dose to 150 mg po qhs.  Please continue to titarate up the clozaril by 50 mg q day---Her dose prior to admission was 350 mg. Plan to taper off haldol once clozaril dose becomes therapeutic.  EPS: no evidence of EPS, will continue amantadine for now.  Agitation and aggression the patient will be started on Haldol 5 mg every 6 hours, Ativan 2 mg every 6 hours and Benadryl every 6 hours as needed for aggression, agitation  HTN: continue lisinopril 5 mg po q day  Metabolic syndrome: continue metformin 500 mg po q am  GERD: continue protonix 40 mg  Gout: continue allopurinol  Constipation: continue miralax daily and will start colace 200 mg po bid  Abscess: Per ER notes: exam does show evidence of new right inframammary  superficial abscess that was drained on 8/14 while in the ER.  Now on Bactrim. Will need daily dressing change  Urine incontinence: will check UA and urine culture  Diet pt has been complaining about her diet---will change to regular  Precautions: q 15 m checks  Unsteady gait: continue using rolling walker and will start fall precautions.  OK for pt to use wheelchair as needed  Hospital status: IVC  Labs: ordered Hba1c, lipid panel, prolactin, TSH  Disposition: Will need to start looking for new placement.  Follow-up: To be determined     Brandy Hale, MD 08/22/2015, 11:23 AM

## 2015-08-22 NOTE — [facility] Group Notes (Signed)
[facility] LCSW Group Therapy  08/22/2015 2:01 PM  Type of Therapy:  Group Therapy  Participation Level:  None  Participation Quality:  Intrusive  Affect:  Not Congruent and Resistant  Cognitive:  Disorganized, Confused and Lacking  Insight:  Off Topic  Engagement in Therapy:  None  Modes of Intervention:  Discussion, Reality Testing and Support  Summary of Progress/Problems: Coping Skills: Patients defined and discussed healthy coping skills. Patients identified healthy coping skills they would like to try during hospitalization and after discharge. CSW offered insight to varying coping skills that may have been new to patients such as practicing mindfulness. Pt interrupted group with inappropriate or random comments. CSW attempted to redirect. Pt became iritable and wanted to move to a different area to the outside courtyard. Pt decided to return indoors.   Gael Delude G. Garnette CzechSampson MSW, LCSWA 08/22/2015, 2:09 PM

## 2015-08-22 NOTE — Progress Notes (Addendum)
Patient with blunted affect, loud speech, brief eye contact. No SI/HI at this time. Patient resistive to plan of care. Sleeps through am meal. Blood pressure low, blood pressure med held and MD notified. Patient wakes up at snack time and takes am meds. Resistive to taking all meds and attempts to pick and choose which meds she will take.  States she will take all her meds if writer gives her potato chips. Limit set and patient takes meds. Using wheelchair. Poor adls. Refuses to shower at this time. Sits at nurses station and yells to Scientist, clinical (histocompatibility and immunogenetics)writer what writer should do. Patient encouraged to throw her food away from her room. Staff cleans patient's room. Safety maintained. Rips up daily audit sheet. Meets with MD. Patient eats lunch and then washes up at sink. Patient labile and loudly yelling at dinner time. Requesting additional dinner tray and snack. Requests to eat in her room. One on one with a few staff to set limits and patient calms. Patient refused shower, provided with toiletries and washes up. Shirt laundered and scrubs pants cut and provided to patient. Wheel chair cleansed. Patient verbally aggressive to Clinical research associatewriter. Writer has female tech present with cares. Patient verbally threatens to wrap sheet around writer's neck and strangle. Patient states to Clinical research associatewriter "Don't f with me, I'm gonna get you".

## 2015-08-22 NOTE — [facility] Group Notes (Signed)
[facility] Group Notes:  (Nursing/MHT/Case Management/Adjunct)  Date:  08/22/2015  Time:  12:47 AM  Type of Therapy:  Psychoeducational Skills  Participation Level:  Did Not Attend   Summary of Progress/Problems:  Amy MilroyLaquanda Y Selby Villarreal 08/22/2015, 12:47 AM

## 2015-08-23 MED ORDER — CLOZAPINE 25 MG PO TABS
250.0000 mg | ORAL_TABLET | Freq: Every day | ORAL | Status: DC
  Administered 2015-08-23: 250 mg via ORAL
  Filled 2015-08-23: qty 2

## 2015-08-23 NOTE — [facility] Group Notes (Signed)
[facility] LCSW Group Therapy   08/23/2015 1pm Type of Therapy: Group Therapy   Participation Level: Active   Participation Quality: Attentive, Sharing and Supportive   Affect: Depressed and Flat   Cognitive: Alert and Oriented   Insight: Developing/Improving and Engaged   Engagement in Therapy: Developing/Improving and Engaged   Modes of Intervention: Clarification, Confrontation, Discussion, Education, Exploration,  Limit-setting, Orientation, Problem-solving, Rapport Building, Dance movement psychotherapisteality Testing, Socialization and Support   Summary of Progress/Problems: Pt identified obstacles faced currently and processed barriers involved in overcoming these obstacles. Pt identified steps necessary for overcoming these obstacles and explored motivation (internal and external) for facing these difficulties head on. Pt further identified one area of concern in their lives and chose a goal to focus on for today. Pt was initially unable to be re-directed but as the session progressed the pt began to respond to prompts from the CSW.  After being told she would have to leave the pt became tearful and apologetic, but had to be re-directed each time she spoke to due her manic tendencies while sharing.  Pt presented as often unitelligible.  Pt was polite and cooperative with the CSW and other group members, but could not remain focused and attentive to the topics discussed and the sharing of others. Dorothe Pea.  Makeshia Seat F. Jamiee Milholland, LCSWA, LCAS

## 2015-08-23 NOTE — Progress Notes (Signed)
Patient had incontinent episode in bed. This writer advised patient to get out of bed so she and bed can be cleaned. Patient was compliant but was loud and fussing at this writer and MHT.  Patient was cleaned and bed was cleaned and remade.  PRN medications given PO.

## 2015-08-23 NOTE — Progress Notes (Signed)
Patient in room out of bed in wheelchair, pulling clothing off. Patient had incontinent episode and took off clothing and then urinated in floor. New clothing and brief put on patient after this writer cleaned patient and preformed  peri care. Patient refused to lay back in bed. Patient is sitting in wheelchair in room talking to self loudly.

## 2015-08-23 NOTE — Progress Notes (Signed)
[facility]  08/23/2015 2:40 PM Amy Villarreal  MRN:  [id number]   Subjective: Ms. Amy SiaBurgessis agitated, loud, disruptive, and threatening. She demands a new room and wants to kill herself. She has been arguing with nursing staff and security guard. She will list her walker in a threatening way. She is very disorganized in her thinking unable to answer simple questions, very intrusive. She is compliant with medicines. Sleep and appetite are good. [facility] As needed medications for agitation are available.   Principal Problem: Schizophrenia (HCC) Diagnosis:   Patient Active Problem List   Diagnosis Date Noted  . Hypertension [I10] 08/17/2015  . Diabetes (HCC) [E11.9] 08/17/2015  . Gout [M10.9] 08/17/2015  . Schizophrenia (HCC) [F20.9] 06/26/2014   Total Time spent with patient: 30 minutes  Past Psychiatric History: Patient has a long history of schizophrenia resistant to multiple medications. Last time I saw her she was taking clozapine and Risperdal together. Right now she is listed as only taking Haldol. I'm told that she had a recent hospitalization and has only been out of the hospital a short period of time. It's not clear whether she's ever seriously tried to hurt herself in the past although she has been aggressive and destructive of property when psychotic.  Past Medical History:  Past Medical History:  Diagnosis Date  . Anxiety   . Asthma   . Constipation   . DJD (degenerative joint disease)   . GERD (gastroesophageal reflux disease)   . Gout   . Hypertension   . Obesity   . Osteoporosis   . Schizoaffective disorder (HCC)   . Urinary incontinence     Past Surgical History:  Procedure Laterality Date  . ABDOMINAL HYSTERECTOMY     Family History: History reviewed. No pertinent family history.  Family Psychiatric  History: not known  Social History: Patient I believe has a guardian or at least a care giver looking after her. She has had  multiple hospitalizations and multiple group homes but has lost position several times because of aggression. History  Alcohol Use  . Yes    Comment: "sometimes"     History  Drug Use No    Social History   Social History  . Marital status: Single    Spouse name: N/A  . Number of children: N/A  . Years of education: N/A   Social History Main Topics  . Smoking status: Current Some Day Smoker    Types: Cigarettes  . Smokeless tobacco: Never Used  . Alcohol use Yes     Comment: "sometimes"  . Drug use: No  . Sexual activity: No   Other Topics Concern  . None   Social History Narrative  . None   Additional Social History:    Pain Medications: see PTA meds Prescriptions: see PTA meds Over the Counter: see PTA meds History of alcohol / drug use?: No history of alcohol / drug abuse     Current Medications: Current Facility-Administered Medications  Medication Dose Route Frequency Provider Last Rate Last Dose  . acetaminophen (TYLENOL) tablet 650 mg  650 mg Oral Q6H PRN Audery AmelJohn T Clapacs, MD   650 mg at 08/21/15 0936  . allopurinol (ZYLOPRIM) tablet 50 mg  50 mg Oral BID Audery AmelJohn T Clapacs, MD   50 mg at 08/23/15 [id number]  . alum & mag hydroxide-simeth (MAALOX/MYLANTA) 200-200-20 MG/5ML suspension 30 mL  30 mL Oral Q4H PRN Audery AmelJohn T Clapacs, MD      .  amantadine (SYMMETREL) solution 100 mg  100 mg Oral BID Jimmy Footman, MD   100 mg at 08/22/15 2241  . aspirin EC tablet 81 mg  81 mg Oral Daily Audery Amel, MD   81 mg at 08/23/15 1610  . bacitracin ointment   Topical Daily Jimmy Footman, MD   1 application at 08/23/15 [phone]  . cloZAPine (CLOZARIL) tablet 200 mg  200 mg Oral QHS Kule Gascoigne B Mitchell Iwanicki, MD   200 mg at 08/22/15 2228  . diphenhydrAMINE (BENADRYL) capsule 50 mg  50 mg Oral Q6H PRN Jimmy Footman, MD   50 mg at 08/23/15 1348   Or  . diphenhydrAMINE (BENADRYL) injection 50 mg  50 mg Intramuscular Q6H PRN Jimmy Footman, MD      .  docusate sodium (COLACE) capsule 200 mg  200 mg Oral BID Jimmy Footman, MD   200 mg at 08/23/15 0920  . haloperidol (HALDOL) tablet 10 mg  10 mg Oral BID Jimmy Footman, MD   10 mg at 08/23/15 0920  . haloperidol (HALDOL) tablet 5 mg  5 mg Oral Q6H PRN Jimmy Footman, MD   5 mg at 08/23/15 1348   Or  . haloperidol lactate (HALDOL) injection 5 mg  5 mg Intramuscular Q6H PRN Jimmy Footman, MD      . lisinopril (PRINIVIL,ZESTRIL) tablet 5 mg  5 mg Oral Daily Audery Amel, MD   5 mg at 08/23/15 0920  . LORazepam (ATIVAN) tablet 2 mg  2 mg Oral Q6H PRN Jimmy Footman, MD   2 mg at 08/23/15 1348   Or  . LORazepam (ATIVAN) injection 2 mg  2 mg Intramuscular Q6H PRN Jimmy Footman, MD      . magnesium hydroxide (MILK OF MAGNESIA) suspension 30 mL  30 mL Oral Daily PRN Audery Amel, MD      . metFORMIN (GLUCOPHAGE) tablet 500 mg  500 mg Oral Q breakfast Audery Amel, MD   500 mg at 08/23/15 0921  . nicotine (NICODERM CQ - dosed in mg/24 hours) patch 21 mg  21 mg Transdermal Daily Shari Prows, MD   21 mg at 08/23/15 0922  . pantoprazole (PROTONIX) EC tablet 40 mg  40 mg Oral Daily Audery Amel, MD   40 mg at 08/23/15 0921  . polyethylene glycol (MIRALAX / GLYCOLAX) packet 17 g  17 g Oral Daily Jimmy Footman, MD   17 g at 08/22/15 0900  . sulfamethoxazole-trimethoprim (BACTRIM DS,SEPTRA DS) 800-160 MG per tablet 1 tablet  1 tablet Oral Q12H Audery Amel, MD   1 tablet at 08/23/15 0920    Lab Results:  No results found for this or any previous visit (from the past 48 hour(s)).  Blood Alcohol level:  Lab Results  Component Value Date   ETH <5 08/16/2015   ETH <5 09/19/2014    Metabolic Disorder Labs: Lab Results  Component Value Date   HGBA1C 6.5 (H) 08/18/2015   Lab Results  Component Value Date   PROLACTIN 50.7 (H) 08/18/2015   Lab Results  Component Value Date   CHOL 179 08/18/2015   TRIG  103 08/18/2015   HDL 36 (L) 08/18/2015   CHOLHDL 5.0 08/18/2015   VLDL 21 08/18/2015   LDLCALC 122 (H) 08/18/2015    Physical Findings: AIMS: Facial and Oral Movements Muscles of Facial Expression: None, normal Lips and Perioral Area: None, normal Jaw: None, normal Tongue: None, normal,Extremity Movements Upper (arms, wrists, hands, fingers): None, normal Lower (legs, knees, ankles,  toes): None, normal, Trunk Movements Neck, shoulders, hips: None, normal, Overall Severity Severity of abnormal movements (highest score from questions above): None, normal Incapacitation due to abnormal movements: None, normal Patient's awareness of abnormal movements (rate only patient's report): No Awareness, Dental Status Current problems with teeth and/or dentures?: No Does patient usually wear dentures?: No  CIWA:    COWS:     Musculoskeletal: Strength & Muscle Tone: within normal limits Gait & Station: normal Patient leans: N/A  Psychiatric Specialty Exam: Physical Exam  Nursing note and vitals reviewed. Constitutional: She appears well-developed and well-nourished.  HENT:  Head: Normocephalic and atraumatic.  Eyes: EOM are normal.  Neck: Normal range of motion.  Respiratory: Effort normal.  Neurological: She is alert.    Review of Systems  Unable to perform ROS: Acuity of condition    Blood pressure 121/63, pulse (!) 112, temperature 98 F (36.7 C), temperature source Oral, resp. rate 16, height 5\' 9"  (1.753 m), weight 118.4 kg (261 lb), SpO2 100 %.Body mass index is 38.54 kg/m.  General Appearance: Fairly Groomed  Eye Contact:  Fair  Speech:  Pressured  Volume:  Increased  Mood:  Anxious  Affect:  Congruent  Thought Process:  Disorganized, Irrelevant and Descriptions of Associations: Loose  Orientation:  NA  Thought Content:  Illogical  Suicidal Thoughts:  N/a  Homicidal Thoughts:  N/A  Memory:  NA  Judgement:  Impaired  Insight:  Lacking  Psychomotor Activity:   Increased  Concentration:  Concentration: Poor and Attention Span: Poor  Recall:  Poor  Fund of Knowledge:  Poor  Language:  Poor  Akathisia:  No  Handed:    AIMS (if indicated):     Assets:  Financial Resources/Insurance Social Support  ADL's:  Intact  Cognition:  Impaired,  Moderate  Sleep:  Number of Hours: 5     Treatment Plan Summary:  Schizophrenia: continue haldol 10 mg twice a day. Patient has been restarted on Clozaril. Will increase dose to 150 mg po qhs.  Please continue to titarate up the clozaril by 50 mg q day---Her dose prior to admission was 350 mg. Plan to taper off haldol once clozaril dose becomes therapeutic.  EPS: no evidence of EPS, will continue amantadine for now.  Agitation and aggression the patient will be started on Haldol 5 mg every 6 hours, Ativan 2 mg every 6 hours and Benadryl every 6 hours as needed for aggression, agitation  HTN: continue lisinopril 5 mg po q day  Metabolic syndrome: continue metformin 500 mg po q am  GERD: continue protonix 40 mg  Gout: continue allopurinol  Constipation: continue miralax daily and will start colace 200 mg po bid  Abscess: Per ER notes: exam does show evidence of new right inframammary superficial abscess that was drained on 8/14 while in the ER.  Now on Bactrim. Will need daily dressing change  Urine incontinence: will check UA and urine culture  Diet pt has been complaining about her diet---will change to regular  Precautions: q 15 m checks  Unsteady gait: continue using rolling walker and will start fall precautions.  OK for pt to use wheelchair as needed  Hospital status: IVC  Labs: ordered Hba1c, lipid panel, prolactin, TSH  Disposition: Will need to start looking for new placement.  Follow-up: To be determined   08/23/2015 I increased Clozaril to 200 mg at night. I will increase it to 250 mg tonight.  Kristine LineaJolanta Tacie Mccuistion, MD 08/23/2015, 2:40 PM

## 2015-08-23 NOTE — Progress Notes (Signed)
Recreation Therapy Notes  Date: 08.21.17 Time: 9:30 am Location: Craft Room  Group Topic: Self-expression  Goal Area(s) Addresses:  Patient will identify one color per emotion listed on wheel. Patient will verbalize benefit of using art as a means of self-expression. Patient will verbalize one emotion experienced during session. Patient will be educated on other forms of self-expression.  Behavioral Response: Arrived late, Attentive, Left early  Intervention: Emotion Wheel  Activity: Patients were given an Emotion Wheel worksheet and instructed to pick a color for each emotion listed on the wheel.  Education: LRT educated patients on other forms of self-expression.  Education Outcome: Patient left before LRT educated group.  Clinical Observations/Feedback: Patient arrived to grop at approximately 9:50 am. LRT explained activity. Patient colored 2 emotions. Patient left group at approximately 10:00 am. Patient did not return to group.  Jacquelynn CreeGreene,Domenique Quest M, LRT/CTRS 08/23/2015 10:23 AM

## 2015-08-23 NOTE — Plan of Care (Signed)
Problem: Safety: Goal: Ability to redirect hostility and anger into socially appropriate behaviors will improve Outcome: Not Progressing Patient got agitated & hitting the wall with her walker.

## 2015-08-23 NOTE — Progress Notes (Signed)
Trashed room, water soaked with a lot of towels in the bathroom, [facility], disorganized, loose association - tangential type, FOI, speech slurred and mumbled, voiding on the bed, stuffed up the toilet; Environmental Services cleaned twice, 3 staff-assist to pick up soaked towels, bed made and BSC placed at EOB for easy access, will schedule toilet assist every 2 hours, monitor more frequently for falls.

## 2015-08-23 NOTE — Progress Notes (Addendum)
Patient is loud and intrusive.Demanding most of the time for anything.Patient got agitated this afternoon to get a new room.Patient was yelling & hitting the wall with her walker.Patient was trying to throw the walker to hit the staff.Assisted to bed and PRN medications given. Patient denies suicidal and homicidal ideations & AV hallucinations.Compliant with medications.

## 2015-08-23 NOTE — [facility] Group Notes (Signed)
[facility] Group Notes:  (Nursing/MHT/Case Management/Adjunct)  Date:  08/23/2015  Time:  1:29 AM  Type of Therapy:  Group Therapy  Participation Level:  None    Summary of Progress/Problems: Pt left early.   Amy Villarreal 08/23/2015, 1:29 AM

## 2015-08-23 NOTE — [facility] Group Notes (Signed)
[facility] Group Notes:  (Nursing/MHT/Case Management/Adjunct)  Date:  08/23/2015  Time:  9:53 PM  Type of Therapy:  Evening Wrap-up Group  Participation Level:  Did Not Attend  Participation Quality:  N/A  Affect:  N/A  Cognitive:  N/A  Insight:  None  Engagement in Group:  Did Not Attend  Modes of Intervention:  Activity and Discussion  Summary of Progress/Problems:  Tomasita MorrowChelsea Nanta Orbie Grupe 08/23/2015, 9:53 PM

## 2015-08-23 NOTE — [facility] Counselor (Addendum)
CSW is unable to complete PSA at this time, pt is too aggressive at this time. CSW will attempt to complete PSA at a later time.   Lynden OxfordKadijah R. Kwasi Joung, MSW, LCSW-A 08/23/2015  4:05PM

## 2015-08-24 MED ORDER — CLOZAPINE 100 MG PO TABS
300.0000 mg | ORAL_TABLET | Freq: Every day | ORAL | Status: DC
  Administered 2015-08-24: 300 mg via ORAL
  Filled 2015-08-24: qty 3

## 2015-08-24 MED ORDER — METOPROLOL SUCCINATE 12.5 MG HALF TABLET
12.5000 mg | ORAL_TABLET | Freq: Every day | ORAL | Status: DC
  Filled 2015-08-24 (×2): qty 1

## 2015-08-24 MED ORDER — METFORMIN HCL 500 MG PO TABS
500.0000 mg | ORAL_TABLET | Freq: Two times a day (BID) | ORAL | Status: DC
  Administered 2015-08-24 – 2015-09-01 (×16): 500 mg via ORAL
  Filled 2015-08-24 (×16): qty 1

## 2015-08-24 MED ORDER — DESMOPRESSIN ACE SPRAY REFRIG 0.01 % NA SOLN
2.0000 | Freq: Every day | NASAL | Status: DC
  Administered 2015-08-24 – 2015-08-31 (×8): 2 via NASAL
  Filled 2015-08-24: qty 5

## 2015-08-24 NOTE — Plan of Care (Signed)
Problem: Safety: Goal: Ability to remain free from injury will improve Outcome: Progressing Medications administered as ordered by the physician, Therapeutic Effects, SEs and Adverse effects discussed, questions encouraged; Ativan 2 mg PRN given for agitation, loud pressured/slurred speech, 15 minute checks maintained for safety, room closer to the nurses station; clinical and moral support provided, patient encouraged to continue to express feelings and demonstrate safe care. Patient remains free from harm, will continue to monitor.

## 2015-08-24 NOTE — Progress Notes (Signed)
Recreation Therapy Notes  Date: 08.22.17 Time: 9:30 am Location: Craft Room  Group Topic: Goal Setting  Goal Area(s) Addresses:  Patient will write one goal. Patient will write at least one supportive statement.  Behavioral Response: Did not attend   Intervention: Step By Step  Activity: Patients were given a foot worksheet and instructed to write a goal inside the foot. On the outside of the foot, patients were instructed to write supportive statements to help them reach their goals.  Education: LRT educated patients on healthy ways to celebrate reaching their goals.  Education Outcome: Patient did not attend group.   Clinical Observations/Feedback: Patient did not attend group.  Jacquelynn CreeGreene,Emalea Mix M, LRT/CTRS 08/24/2015 10:14 AM

## 2015-08-24 NOTE — Progress Notes (Signed)
Amy Villarreal is not as disruptive, no room trashing, toilet not flooded, [facility] still need[facility], speech still mumbled and slurred; voice volume not as loud, interacting with peers in the day room; will continue to redirect, assist with ADL by reminding patient every 2 hours toileting.

## 2015-08-24 NOTE — [facility] Counselor (Signed)
PSA attempt w patient's guardian, Amy Villarreal.  States she is in meeting and unable to talk - states she will call CSW back later.  CSW contact information provided.  Santa GeneraAnne Cunningham, LCSW Lead Clinical Social Worker Phone:  [phone]

## 2015-08-24 NOTE — Social Work (Addendum)
[facility], Amy Villarreal [phone] who will assist in discharge planning. Care Coordinator, Amy Villarreal stated that patient has a history of being kicked out of group homes and will be difficult to place. Care coordinator stated that she has sent applications to high level group homes in other [place] that are [facility] with [facility]. Amy Villarreal will contact group homes and assisted living facilities to check on status of applications. CSW and care coordinator will continue to work on discharge planning.    Amy OxfordKadijah R. Nakima Villarreal, MSW, LCSW-A 08/24/15  9:02AM

## 2015-08-24 NOTE — Progress Notes (Signed)
D:Patient slept late this am shift . Patient remains  argumentative  Loud and intrusive  with Clinical research associatewriter and peers . Speech pressured no organizational skills noted. No ADL's . Patient walked with  Dan HumphreysWalker . Patient noted to use walker as weapon  Towards staff.  Patient voice to staff  " could she get some foreplay"  patient began to hunch  And do other  sexual movements. Patient looking at  Female security  Flicking her nipple . Patient received her tray for lunch threw the milk carton at staff's feet. Patient had received her medications  Began to yell at  writer  About her medication  Stating she has not receive them.  A.  Encourage patient participation with unit programming . Instruction  Given on  Medication , . R: Voice no other concerns. Staff continue to monitor

## 2015-08-24 NOTE — [facility] Group Notes (Addendum)
Goals Group Date/Time: 08/24/2015 9:00 AM Type of Therapy and Topic: Group Therapy: Goals Group: SMART Goals   Participation Level: Moderate  Description of Group:    The purpose of a daily goals group is to assist and guide patients in setting recovery/wellness-related goals. The objective is to set goals as they relate to the crisis in which they were admitted. Patients will be using SMART goal modalities to set measurable goals. Characteristics of realistic goals will be discussed and patients will be assisted in setting and processing how one will reach their goal. Facilitator will also assist patients in applying interventions and coping skills learned in psycho-education groups to the SMART goal and process how one will achieve defined goal.   Therapeutic Goals:   -Patients will develop and document one goal related to or their crisis in which brought them into treatment.  -Patients will be guided by LCSW using SMART goal setting modality in how to set a measurable, attainable, realistic and time sensitive goal.  -Patients will process barriers in reaching goal.  -Patients will process interventions in how to overcome and successful in reaching goal.   Patient's Goal: Patient invited but did not attend.   Therapeutic Modalities:  Motivational Interviewing  Cognitive Behavioral Therapy  Crisis Intervention Model  SMART goals setting   Jakobi Thetford R. Adonay Scheier, LCSWA    

## 2015-08-24 NOTE — Plan of Care (Signed)
Problem: Safety: Goal: Ability to remain free from injury will improve Outcome: Progressing Patient able to  Alert  Staff for needs,  Using walker as means  To move about unit

## 2015-08-24 NOTE — [facility] Counselor (Signed)
PSA attempt w legal guardian, [name].  No answer, CSW left message requesting call back.    Santa GeneraAnne Billiejean Schimek, LCSW Lead Clinical Social Worker Phone:  [phone]

## 2015-08-24 NOTE — [facility] Group Notes (Signed)
[facility] LCSW Group Therapy   08/24/2015  1 pm  Type of Therapy: Group Therapy   Participation Level: Did Not Attend. Patient invited to participate but declined.    Jamilet Ambroise F. Ison Wichmann, MSW, LCSWA, LCAS     

## 2015-08-24 NOTE — Progress Notes (Addendum)
[facility]  08/24/2015 8:41 AM Amy Villarreal  MRN:  [id number]   Subjective: Yesterday Ms. Burgessis agitated, loud, disruptive, and threatening. Amy Villarreal demands a new room and wants to kill herself. Amy Villarreal has been arguing with nursing staff and security guard. Amy Villarreal was trying to throw her walker at nurses. Amy Villarreal is very disorganized in her thinking unable to answer simple questions, very intrusive. Amy Villarreal is compliant with medicines.   Today Amy Villarreal has been calmer.  Thought process still disorganized.  Speech is pushed.  Per nursing: Trashed room, water soaked with a lot of towels in the bathroom, [facility], disorganized, loose association - tangential type, FOI, speech slurred and mumbled, voiding on the bed, stuffed up the toilet; Environmental Services cleaned twice, 3 staff-assist to pick up soaked towels, bed made and BSC placed at EOB for easy access, will schedule toilet assist every 2 hours, monitor more frequently for falls.  Principal Problem: Schizophrenia (HCC) Diagnosis:   Patient Active Problem List   Diagnosis Date Noted  . Hypertension [I10] 08/17/2015  . Diabetes (HCC) [E11.9] 08/17/2015  . Gout [M10.9] 08/17/2015  . Schizophrenia (HCC) [F20.9] 06/26/2014   Total Time spent with patient: 30 minutes  Past Psychiatric History: Patient has a long history of schizophrenia resistant to multiple medications. Last time I saw her Amy Villarreal was taking clozapine and Risperdal together. Right now Amy Villarreal is listed as only taking Haldol. I'm told that Amy Villarreal had a recent hospitalization and has only been out of the hospital a short period of time. It's not clear whether Amy Villarreal's ever seriously tried to hurt herself in the past although Amy Villarreal has been aggressive and destructive of property when psychotic.  Past Medical History:  Past Medical History:  Diagnosis Date  . Anxiety   . Asthma   . Constipation   . DJD (degenerative joint disease)   . GERD (gastroesophageal reflux  disease)   . Gout   . Hypertension   . Obesity   . Osteoporosis   . Schizoaffective disorder (HCC)   . Urinary incontinence     Past Surgical History:  Procedure Laterality Date  . ABDOMINAL HYSTERECTOMY     Family History: History reviewed. No pertinent family history.  Family Psychiatric  History: not known  Social History: Patient I believe has a guardian or at least a care giver looking after her. Amy Villarreal has had multiple hospitalizations and multiple group homes but has lost position several times because of aggression. History  Alcohol Use  . Yes    Comment: "sometimes"     History  Drug Use No    Social History   Social History  . Marital status: Single    Spouse name: N/A  . Number of children: N/A  . Years of education: N/A   Social History Main Topics  . Smoking status: Current Some Day Smoker    Types: Cigarettes  . Smokeless tobacco: Never Used  . Alcohol use Yes     Comment: "sometimes"  . Drug use: No  . Sexual activity: No   Other Topics Concern  . None   Social History Narrative  . None   Additional Social History:    Pain Medications: see PTA meds Prescriptions: see PTA meds Over the Counter: see PTA meds History of alcohol / drug use?: No history of alcohol / drug abuse     Current Medications: Current Facility-Administered Medications  Medication Dose Route Frequency Provider Last Rate Last Dose  .  acetaminophen (TYLENOL) tablet 650 mg  650 mg Oral Q6H PRN Audery AmelJohn T Clapacs, MD   650 mg at 08/21/15 0936  . allopurinol (ZYLOPRIM) tablet 50 mg  50 mg Oral BID Audery AmelJohn T Clapacs, MD   50 mg at 08/23/15 2212  . alum & mag hydroxide-simeth (MAALOX/MYLANTA) 200-200-20 MG/5ML suspension 30 mL  30 mL Oral Q4H PRN Audery AmelJohn T Clapacs, MD      . amantadine (SYMMETREL) solution 100 mg  100 mg Oral BID Jimmy FootmanAndrea Hernandez-Gonzalez, MD   10 mg at 08/23/15 2349  . aspirin EC tablet 81 mg  81 mg Oral Daily Audery AmelJohn T Clapacs, MD   81 mg at 08/23/15 [id number]  . bacitracin  ointment   Topical Daily Jimmy FootmanAndrea Hernandez-Gonzalez, MD   1 application at 08/23/15 [phone]  . cloZAPine (CLOZARIL) tablet 250 mg  250 mg Oral QHS Jolanta B Pucilowska, MD   250 mg at 08/23/15 2210  . diphenhydrAMINE (BENADRYL) capsule 50 mg  50 mg Oral Q6H PRN Jimmy FootmanAndrea Hernandez-Gonzalez, MD   50 mg at 08/23/15 1348   Or  . diphenhydrAMINE (BENADRYL) injection 50 mg  50 mg Intramuscular Q6H PRN Jimmy FootmanAndrea Hernandez-Gonzalez, MD      . docusate sodium (COLACE) capsule 200 mg  200 mg Oral BID Jimmy FootmanAndrea Hernandez-Gonzalez, MD   200 mg at 08/23/15 2209  . haloperidol (HALDOL) tablet 10 mg  10 mg Oral BID Jimmy FootmanAndrea Hernandez-Gonzalez, MD   10 mg at 08/23/15 2211  . haloperidol (HALDOL) tablet 5 mg  5 mg Oral Q6H PRN Jimmy FootmanAndrea Hernandez-Gonzalez, MD   5 mg at 08/23/15 1348   Or  . haloperidol lactate (HALDOL) injection 5 mg  5 mg Intramuscular Q6H PRN Jimmy FootmanAndrea Hernandez-Gonzalez, MD      . lisinopril (PRINIVIL,ZESTRIL) tablet 5 mg  5 mg Oral Daily Audery AmelJohn T Clapacs, MD   5 mg at 08/23/15 0920  . LORazepam (ATIVAN) tablet 2 mg  2 mg Oral Q6H PRN Jimmy FootmanAndrea Hernandez-Gonzalez, MD   2 mg at 08/23/15 2351   Or  . LORazepam (ATIVAN) injection 2 mg  2 mg Intramuscular Q6H PRN Jimmy FootmanAndrea Hernandez-Gonzalez, MD      . magnesium hydroxide (MILK OF MAGNESIA) suspension 30 mL  30 mL Oral Daily PRN Audery AmelJohn T Clapacs, MD      . metFORMIN (GLUCOPHAGE) tablet 500 mg  500 mg Oral Q breakfast Audery AmelJohn T Clapacs, MD   500 mg at 08/23/15 0921  . nicotine (NICODERM CQ - dosed in mg/24 hours) patch 21 mg  21 mg Transdermal Daily Shari ProwsJolanta B Pucilowska, MD   21 mg at 08/23/15 0922  . pantoprazole (PROTONIX) EC tablet 40 mg  40 mg Oral Daily Audery AmelJohn T Clapacs, MD   40 mg at 08/23/15 0921  . polyethylene glycol (MIRALAX / GLYCOLAX) packet 17 g  17 g Oral Daily Jimmy FootmanAndrea Hernandez-Gonzalez, MD   17 g at 08/22/15 0900  . sulfamethoxazole-trimethoprim (BACTRIM DS,SEPTRA DS) 800-160 MG per tablet 1 tablet  1 tablet Oral Q12H Audery AmelJohn T Clapacs, MD   1 tablet at 08/23/15 2213     Lab Results:  No results found for this or any previous visit (from the past 48 hour(s)).  Blood Alcohol level:  Lab Results  Component Value Date   [facility] 08[facility]   ETH <5 09/19/2014    Metabolic Disorder Labs: Lab Results  Component Value Date   HGBA1C 6.5 (H) 08/18/2015   Lab Results  Component Value Date   PROLACTIN 50.7 (H) 08/18/2015   Lab Results  Component Value  Date   CHOL 179 08/18/2015   TRIG 103 08/18/2015   HDL 36 (L) 08/18/2015   CHOLHDL 5.0 08/18/2015   VLDL 21 08/18/2015   LDLCALC 122 (H) 08/18/2015    Physical Findings: AIMS: Facial and Oral Movements Muscles of Facial Expression: None, normal Lips and Perioral Area: None, normal Jaw: None, normal Tongue: None, normal,Extremity Movements Upper (arms, wrists, hands, fingers): None, normal Lower (legs, knees, ankles, toes): None, normal, Trunk Movements Neck, shoulders, hips: None, normal, Overall Severity Severity of abnormal movements (highest score from questions above): None, normal Incapacitation due to abnormal movements: None, normal Patient's awareness of abnormal movements (rate only patient's report): No Awareness, Dental Status Current problems with teeth and/or dentures?: No Does patient usually wear dentures?: No  CIWA:    COWS:     Musculoskeletal: Strength & Muscle Tone: within normal limits Gait & Station: normal Patient leans: N/A  Psychiatric Specialty Exam: Physical Exam  Nursing note and vitals reviewed. Constitutional: Amy Villarreal appears well-developed and well-nourished.  HENT:  Head: Normocephalic and atraumatic.  Eyes: EOM are normal.  Neck: Normal range of motion.  Respiratory: Effort normal.  Neurological: Amy Villarreal is alert.    Review of Systems  Unable to perform ROS: Acuity of condition    Blood pressure 101/65, pulse 98, temperature 98 F (36.7 C), temperature source Oral, resp. rate 20, height 5\' 9"  (1.753 m), weight 118.4 kg (261 lb), SpO2 98 %.Body mass  index is 38.54 kg/m.  General Appearance: Fairly Groomed  Eye Contact:  Fair  Speech:  Pressured  Volume:  Increased  Mood:  Anxious  Affect:  Congruent  Thought Process:  Disorganized, Irrelevant and Descriptions of Associations: Loose  Orientation:  NA  Thought Content:  Illogical  Suicidal Thoughts:  N/a  Homicidal Thoughts:  N/A  Memory:  NA  Judgement:  Impaired  Insight:  Lacking  Psychomotor Activity:  Increased  Concentration:  Concentration: Poor and Attention Span: Poor  Recall:  Poor  Fund of Knowledge:  Poor  Language:  Poor  Akathisia:  No  Handed:    AIMS (if indicated):     Assets:  Financial Resources/Insurance Social Support  ADL's:  Intact  Cognition:  Impaired,  Moderate  Sleep:  Number of Hours: 5.45     Treatment Plan Summary:  Schizophrenia: continue haldol 10 mg twice a day. Patient has been restarted on Clozaril. Her dose prior to admission was 350 mg. Plan to taper off haldol once clozaril dose becomes therapeutic.  Today Amy Villarreal is on clozaril 300 mg   Plan to start depakote to target mood: will obtain permission from guardian.  EPS: no evidence of EPS, will d/c amantadine  Agitation and aggression the patient will be started on Haldol 5 mg every 6 hours, Ativan 2 mg every 6 hours and Benadryl every 6 hours as needed for aggression, agitation  HTN: will d/c lisinopril as I plan to start her on metoprolol.  Pt tachycardic due to clozaril treatment.  Metabolic syndrome: continue metformin 500 mg po bid  GERD: continue protonix 40 mg  Gout: continue allopurinol  Constipation: continue miralax daily and  colace 200 mg po bid  Abscess: Per ER notes: exam does show evidence of new right inframammary superficial abscess that was drained on 8/14 while in the ER.  Now on Bactrim. Will need daily dressing change  Urine incontinence: UA clear on 8/18.  Likely enuresis due to clozaril.  Will start desmopressin 1 spray on each nostril at  night  Diet  pt has been complaining about her diet---will change to regular  Precautions: q 15 m checks  Unsteady gait: continue using rolling walker, continue  fall precautions.  OK for pt to use wheelchair as needed   Hospital status: IVC  Labs: ordered Hba1c, lipid panel, prolactin, TSH  Disposition: [facility]erral [facility]  Follow-up: To be determined  EKG ordered today to evaluate for tachycardia.  ---Pt is currently psychiatrically unstable and is not able to complete tobacco screening, SW screening or nursing assessment----  Jimmy Footman, MD 08/24/2015, 8:41 AM

## 2015-08-25 LAB — CBC WITH DIFFERENTIAL/PLATELET
Basophils Absolute: 0.1 10*3/uL (ref 0–0.1)
Basophils Relative: 1 %
EOS ABS: 0.3 10*3/uL (ref 0–0.7)
Eosinophils Relative: 3 %
HEMATOCRIT: 34.3 % — AB (ref 35.0–47.0)
HEMOGLOBIN: 11.4 g/dL — AB (ref 12.0–16.0)
LYMPHS ABS: 3.7 10*3/uL — AB (ref 1.0–3.6)
Lymphocytes Relative: 27 %
MCH: 28.5 pg (ref 26.0–34.0)
MCHC: 33.2 g/dL (ref 32.0–36.0)
MCV: 85.7 fL (ref 80.0–100.0)
MONOS PCT: 6 %
Monocytes Absolute: 0.9 10*3/uL (ref 0.2–0.9)
NEUTROS ABS: 8.5 10*3/uL — AB (ref 1.4–6.5)
NEUTROS PCT: 63 %
Platelets: 529 10*3/uL — ABNORMAL HIGH (ref 150–440)
RBC: 4.01 MIL/uL (ref 3.80–5.20)
RDW: 14.6 % — ABNORMAL HIGH (ref 11.5–14.5)
WBC: 13.4 10*3/uL — AB (ref 3.6–11.0)

## 2015-08-25 MED ORDER — HALOPERIDOL 5 MG PO TABS
5.0000 mg | ORAL_TABLET | Freq: Two times a day (BID) | ORAL | Status: DC
  Administered 2015-08-25 – 2015-08-26 (×2): 5 mg via ORAL
  Filled 2015-08-25 (×2): qty 1

## 2015-08-25 MED ORDER — DIVALPROEX SODIUM ER 500 MG PO TB24
1500.0000 mg | ORAL_TABLET | Freq: Every day | ORAL | Status: DC
  Administered 2015-08-25 – 2015-08-31 (×7): 1500 mg via ORAL
  Filled 2015-08-25 (×7): qty 3

## 2015-08-25 MED ORDER — METOPROLOL SUCCINATE ER 25 MG PO TB24
12.5000 mg | ORAL_TABLET | Freq: Every day | ORAL | Status: DC
  Administered 2015-08-26: 12.5 mg via ORAL
  Filled 2015-08-25: qty 1

## 2015-08-25 MED ORDER — IPRATROPIUM BROMIDE 0.06 % NA SOLN
1.0000 | Freq: Every day | NASAL | Status: DC
  Administered 2015-08-25 – 2015-08-31 (×7): 1 via NASAL
  Filled 2015-08-25: qty 15

## 2015-08-25 MED ORDER — CLOZAPINE 25 MG PO TABS
350.0000 mg | ORAL_TABLET | Freq: Every day | ORAL | Status: DC
  Administered 2015-08-25 – 2015-08-31 (×7): 350 mg via ORAL
  Filled 2015-08-25 (×7): qty 2

## 2015-08-25 MED ORDER — METOPROLOL SUCCINATE ER 25 MG PO TB24: 25.0000 mg | ORAL_TABLET | Freq: Every day | ORAL | Status: DC

## 2015-08-25 NOTE — Progress Notes (Signed)
MEDICATION RELATED CONSULT NOTE -follow up  Pharmacy Consult for clozapine Indication: schizophrenia  No Known Allergies  Patient Measurements: Height: 5\' 9"  (175.3 cm) Weight: 261 lb (118.4 kg) IBW/kg (Calculated) : 66.2  Vital Signs: Temp: 98.2 F (36.8 C) (08/23 0700) BP: 107/57 (08/23 0700) Pulse Rate: 99 (08/23 0700) Intake/Output from previous day: 08/22 0701 - 08/23 0700 In: 720 [P.O.:720] Out: -  Intake/Output from this shift: Total I/O In: 120 [P.O.:120] Out: -   Labs:  Recent Labs  08/25/15 1007  WBC 13.4*  HGB 11.4*  HCT 34.3*  PLT 529*   Estimated Creatinine Clearance: 119.5 mL/min (by C-G formula based on SCr of 0.8 mg/dL).   Microbiology: Recent Results (from the past 720 hour(s))  Urine culture     Status: Abnormal   Collection Time: 08/20/15  7:54 AM  Result Value Ref Range Status   Specimen Description URINE, CLEAN CATCH  Final   Special Requests NONE  Final   Culture MULTIPLE SPECIES PRESENT, SUGGEST RECOLLECTION (A)  Final   Report Status 08/22/2015 FINAL  Final    Medical History: Past Medical History:  Diagnosis Date  . Anxiety   . Asthma   . Constipation   . DJD (degenerative joint disease)   . GERD (gastroesophageal reflux disease)   . Gout   . Hypertension   . Obesity   . Osteoporosis   . Schizoaffective disorder (HCC)   . Urinary incontinence     Medications:  Scheduled:  . allopurinol  50 mg Oral BID  . aspirin EC  81 mg Oral Daily  . bacitracin   Topical Daily  . cloZAPine  350 mg Oral QHS  . desmopressin  2 spray Nasal QHS  . docusate sodium  200 mg Oral BID  . haloperidol  10 mg Oral BID  . metFORMIN  500 mg Oral BID WC  . metoprolol succinate  12.5 mg Oral Daily  . nicotine  21 mg Transdermal Daily  . pantoprazole  40 mg Oral Daily  . polyethylene glycol  17 g Oral Daily  . sulfamethoxazole-trimethoprim  1 tablet Oral Q12H    Assessment: 50 yo female with order for clozapine 350mg  PO QHS for  schizophrenia.   08/16: ANC 10.7  08/23  ANC 8.5  Goal of Therapy:  Continue therapy and monitor for side effects.   Plan:  Submitted current ANC to Clozapine Registry. Pt is eligible for dispensing. Weekly monitoring. Next labs are due 8/30.   Kazden Largo A 08/25/2015,11:13 AM

## 2015-08-25 NOTE — Progress Notes (Signed)
D: Patient drooling large amounts  onto pillow and clothing. Interacting with peers and staff . Patient remains loud and intrusive Patient 's speech pressured and  Garbled . Appropriate ADL'S this shift. Appetite good and voice no concerns around sleep. Patient has limited time spent in program activities.    No auditory hallucinations  No pain concerns . Appropriate ADL'S. Interacting with peers and staff.  A: Encourage patient participation with unit programming . Instruction  Given on  Medication Patient  Is not knowledgeable on her medications . Information given in concrete formate  , verbalize understanding.Encourage  Patient to  Come to staff for any concerns  R: Voice no other concerns. Staff continue to monitor

## 2015-08-25 NOTE — Plan of Care (Signed)
Problem: Coping: Goal: Ability to verbalize frustrations and anger appropriately will improve Outcome: Not Progressing Not able to use coping skills

## 2015-08-25 NOTE — Clinical Social Work Note (Signed)
Guardian, [name], gave verbal permission for CSW to contact patient [name], [name], [phone].  Guardian [facility] for appropriate family contact.   Santa GeneraAnne Marney Treloar, LCSW Lead Clinical Social Worker Phone:  [phone]

## 2015-08-25 NOTE — Plan of Care (Signed)
Problem: Activity: Goal: Interest or engagement in activities will improve Outcome: Progressing Patient able to use bedside commode to Complete ADL

## 2015-08-25 NOTE — Progress Notes (Signed)
Loud, rambling, rehearsing Song at bedtime, requested more food, Ativan 2 mg given at 2306 and patient responded well after an hour; Estimated Sleep Hours is 6.

## 2015-08-25 NOTE — Progress Notes (Signed)
Recreation Therapy Notes  Date: 08.23.17 Time: 1:00 pm Location: Craft Room  Group Topic: Self-esteem  Goal Area(s) Addresses:  Patient will identify positive traits about self. Patient will identify at least one healthy coping skill.  Behavioral Response: Did not attend  Intervention: All About Me  Activity: Patients were instructed to make an All About Me pamphlet including their life's motto, positive traits, healthy coping skills, and their support system.  Education: LRT educated patients on ways they can increase their self-esteem.  Education Outcome: Patient did not attend group.  Clinical Observations/Feedback: Patient did not attend group.  Jacquelynn CreeGreene,Ameisha Mcclellan M, LRT/CTRS 08/25/2015 2:46 PM

## 2015-08-25 NOTE — Tx Team (Signed)
Interdisciplinary Treatment Plan Update (Adult)         Date: 08/25/2015   Time Reviewed: 10:30 AM   Progress in Treatment: Improving Attending groups: Yes  Participating in groups: Yes  Taking medication as prescribed: Yes  Tolerating medication: Yes  Family/Significant other contact made: CSW spoke with legal guardian Patient understands diagnosis: Yes  Discussing patient identified problems/goals with staff: Yes  Medical problems stabilized or resolved: Yes  Denies suicidal/homicidal ideation: Yes  Issues/concerns per patient self-inventory: Yes  Other:   New problem(s) identified: N/A   Discharge Plan or Barriers: see below   Reason for Continuation of Hospitalization:   Depression   Anxiety   Medication Stabilization   Comments: N/A   Estimated length of stay: 3-5 days    Patient is a 50 year old female admitted for aggressive behaviors with a diagnosis of schizophrenia. Patient lives in [place], [place]. Patient will benefit from crisis stabilization, medication evaluation, group therapy, and psycho education in addition to case management for discharge planning. Patient and CSW reviewed pt's identified goals and treatment plan. Pt verbalized understanding and agreed to treatment plan.    Review of initial/current patient goals per problem list:  1. Goal(s): Patient will participate in aftercare plan   Met: No  Target date: 3-5 days post admission date   As evidenced by: Patient will participate within aftercare plan AEB aftercare provider and housing plan at discharge being identified.   CSW assessing proper aftercare plans.  2. Goal (s): Patient will exhibit decreased depressive symptoms and suicidal ideations.   Met: Goal progressing   Target date: 3-5 days post admission date   As evidenced by: Patient will utilize self-rating of depression at 3 or below and demonstrate decreased signs of depression or be deemed stable for discharge by MD.   Pt's  depression score of 4 at this time. Denies SI/HI at this time.  3. Goal(s): Patient will demonstrate decreased signs and symptoms of anxiety.   Met: No  Target date: 3-5 days post admission date   As evidenced by: Patient will utilize self-rating of anxiety at 3 or below and demonstrated decreased signs of anxiety, or be deemed stable for discharge by MD   Pt has an anxiety score of 5 at this time.   5. Goal(s): Patient will demonstrate decreased signs of psychosis  * Met: No * Target date: 3-5 days post admission date  * As evidenced by: Patient will demonstrate decreased frequency of AVH or return to baseline function   Pt is still psychotic at this time - AVH at this time.    Attendees:  Patient: Amy Villarreal  Family:  Physician: Merlyn Albert, MD    08/25/2015 10:30AM  Nursing: Polly Cobia, RN     08/25/2015 10:30AM  Clinical Social Worker: Glorious Peach, Baird  08/25/2015 10:30AM  Other: Recreational Therapist, Everitt Amber  08/25/2015 10:30AM

## 2015-08-25 NOTE — [facility] Group Notes (Signed)
[facility] LCSW Group Therapy  08/25/2015 10:46 AM  Type of Therapy:  Group Therapy  Participation Level:  Pt did not attend group. CSW invited pt to group.   Summary of Progress/Problems: Emotional Regulation: Patients will identify both negative and positive emotions. They will discuss emotions they have difficulty regulating and how they impact their lives. Patients will be asked to identify healthy coping skills to combat unhealthy reactions to negative emotions.    Amy Villarreal G. Garnette CzechSampson MSW, LCSWA 08/25/2015, 10:46 AM

## 2015-08-25 NOTE — B[facility] Counselor (Signed)
Adult Comprehensive Assessment  Patient ID: Amy Villarreal, female   DOB: 08/29/1965, 50 y.o.   MRN: [id number]  Information Source: Information source: Patient Amy Villarreal(Guardian, Amy Villarreal [phone])  Current Stressors:  Educational / Learning stressors: high school graduate, did well in school per sister, "she was smart" Employment / Job issues: not working, on disability Family Relationships: family [place] in [place]; father deceased/mother lives in [place] Financial / Lack of resources (include bankruptcy): disability income Housing / Lack of housing: cannot return to current ALF, [facility]; says ALF has not been good for her, "it just will not work", "if [employer] Pet[employer]ammy doesnt get her way, she just wants to fight and do what she wants to do", does not like to be restricted; sister feels pt would be safer living in instutional setting for future, "she has been placed in a lot of assisted livings and none of them work, they just trigger her off" Physical health (include injuries & life threatening diseases): has difficulty communicating, past history of hypertension and diabetes (non insulin dependent) Social relationships: has difficult time getting along w others in various placement Substance abuse: unknown;may have had substance abuse issues in the past, was arrested for possession of paraphenialia/prostitution/misuse of 911 in the past per guardian; hx of aggression and property damage in the past Bereavement / Loss: unknown  Living/Environment/Situation:  Living Arrangements: Group Home Living conditions (as described by patient or guardian): [facility] - was placed and within 24 hours was making inappropriate sexual advances, threatening others, put halls in the wall; multiple outbursts How long has patient lived in current situation?: less than 24 hours; has been bounced around to different regions in [place] in care of [facility]; was placed in [facility] [facility] to [facility] What is atmosphere in current home: Temporary  Family History:  Marital status: Single Are you sexually active?: No What is your sexual orientation?: unknown Has your sexual activity been affected by drugs, alcohol, medication, or emotional stress?: unknown Does patient have children?: Yes How many children?: 1 How is patient's relationship with their children?: son Amy Villarreal (27) was in prison per guardian, now released in Jan/Feb, has visited "a couple of time"  Childhood History:  By whom was/is the patient raised?: Mother Additional childhood history information: Ra[place] mother [place], "normal childhood" per sister, "she was very smart in school" Description of patient's relationship with caregiver when they were a child: unknown Patient's description of current relationship with people who raised him/her: Family lives in [place], have "exacerbated all abilities to help her", has not been with family for quite some time, sister Amy DroneBrenda has tried to keep in touch w her How were you disciplined when you got in trouble as a child/adolescent?: unknown Does patient have siblings?: Yes Number of Siblings: 4 Description of patient's current relationship with siblings: at least one sister who has reached out to the guardian; Amy HighlandBrenda Villarreal [phone]; 3 brothers, 2 sisters, (one older sister passed away a long time ago); sister states "it just really got out of hand for the past 3 years, I dont know what got into her, its hard to see what's going on w her", was more stable Did patient suffer any verbal/emotional/physical/sexual abuse as a child?: No Did patient suffer from severe childhood neglect?: No Has patient ever been sexually abused/assaulted/raped as an adolescent or adult?: No Was the patient ever a victim of a crime or a disaster?: No Witnessed domestic violence?: No Has patient  been effected by domestic violence as an adult?: No  Education:  Highest grade of  school patient has completed: high school graduate, no special classes, [employer] Currently a student?: No Learning disability?: No  Employment/Work Situation:   Employment situation: On disability Why is patient on disability: mental health diagnosis How long has patient been on disability: used to work in [employer], [employer] place, "when she was 23, her symptoms got bad" Patient's job has been impacted by current illness: No What is the longest time patient has a held a job?: has not worked at this time Has patient ever been in the Eli Lilly and Companymilitary?: No Has patient ever served in combat?: No Did You Receive Any Psychiatric Treatment/Services While in Equities traderthe Military?: No Are There Guns or Other Weapons in Your Home?: No  Financial Resources:   Surveyor, quantityinancial resources: [employer], Receives SSI Does patient have a Lawyerrepresentative payee or guardian?: Yes Name of representative payee or guardian: Barbarann EhlersVeda Villarreal, [facility]  Alcohol/Substance Abuse:   What has been your use of drugs/alcohol within the last 12 months?: no current use, past history of legal charges for drug use; prior use of alcohol, THC and crack cocaine in 2015; was set up w [facility] at that time for help w substance abuse and mental health issues (in prior note, MDs stated that substance use has caused adverse reactions w mental health medications") If attempted suicide, did drugs/alcohol play a role in this?: No Alcohol/Substance Abuse Treatment Hx: Denies past history Has alcohol/substance abuse ever caused legal problems?: Yes (arrests for paraphenalia)  Social Support System:   Lubrizol CorporationPatient's Community Support System: Poor Describe Community Support System: family has not been involved w patient for quite some time, has difficulty getting along w others in placements, does not want to live in group home setting Type of faith/religion: unknown  Leisure/Recreation:   Leisure and Hobbies: go to [employer]cookouts and  parties, socialize w friends  Strengths/Needs:   What things does the patient do well?: when feeling well and taking medications, patient states that she is sociable; "what helps her to calm down/feel better/feel supported are 'talk to me and tell me to take my medication'" In what areas does patient struggle / problems for patient: Frequent hospitalizations, often nonadherent in the community and does not take medications or follow up leading to decompensation and rehospitalization; has been determined "totally incompetent" (pain in shoulder/leg/knee, hears voices, feels paranoid)  Discharge Plan:   Does patient have access to transportation?: Yes Will patient be returning to same living situation after discharge?: No (Patient cannot return to prior ALF, has been in/out of placements since 12/15/08 when was determined to be "totally incompetent"; placements have been in various regions in [place], none have been successful placements) Plan for living situation after discharge: refer to state hospital, care coordinator and guardian are also searching for appropriate living situation;  Currently receiving community mental health services: Yes (From Whom) (Last with [facility] prior to this admission) If no, would patient like referral for services when discharged?: Yes (What county?) Does patient have financial barriers related to discharge medications?: No  Summary/Recommendations:   Summary and Recommendations (to be completed by the evaluator): Patient is a 50 year old female, admitted involuntarily and diagnosed w Schizoaffective Disorder.  [facility] is guardian, patient declared in need of guardian in 2010.  Multiple hospitalizations, ACT team services, outpatient care over past 7 - 10 years.  Has been placed in multiple  group living settings but none have been successful, patient does not want to remain in care and has been aggressive to other residents and staff.  Has had several inpatient stays at  [facility]; after discharge was found in June 2014 "wandering the streets and does not want to live in a group home".  Per sister, ALF placements have not been successful and sister feels long term care in institutional setting will be most benefical.  Patient has history of arrests for substance use related charges, has history of abuse of alcohol, cocaine.  Was currently followed by [facility] in the community.  Patient will benefit from hospitalization for crisis stabilization, medication evaluation, group psychotherapy and psychoeducation.  Discharge case management will assist w referral to community resources.  Goals of hospitalization include decrease in aggressive behavior, increased coping skills, stable medication regimen and appropriate community supports to enhance wellness and recovery.    Sallee Lange. 08/25/2015

## 2015-08-25 NOTE — Progress Notes (Addendum)
[facility]  08/25/2015 3:21 PM Amy Villarreal  MRN:  [id number]   Subjective: On Monday Ms. Burgessis agitated, loud, disruptive, and threatening. She demands a new room and wants to kill herself. She has been arguing with nursing staff and security guard. She was trying to throw her walker at nurses. She is very disorganized in her thinking unable to answer simple questions, very intrusive. Per nursing:Trashed room, water soaked with a lot of towels in the bathroom, BSC bucket in bed and [facility], disorganized, loose association - tangential type, FOI, speech slurred and mumbled, voiding on the bed, stuffed up the toilet; Environmental Services cleaned twice, 3 staff-assist to pick up soaked towels, bed made and BSC placed at EOB for easy access, will schedule toilet assist every 2 hours, monitor more frequently for falls.  On Tuesday  Thought process still disorganized.  Speech is pushed.  Patient threw milk to one of the techs.  Guardian gave permission to start depakote and desmopressin for incontinence  Today no change thought process is disorganized. Patient perseverate on things and will not answer any other questions outside her concerns usually her concern has been wanting to used to wheelchair because of leg pain. However when she seen walking with a walker she does not have major difficulty.  Patient is compliant with medications. Oral intake is within the normal limits. Patient isn't sleeping well. Per staff today:Patient drooling large amounts  onto pillow and clothing. Interacting with peers and staff . Patient remains loud and intrusive Patient 's speech pressured and  Garbled    Principal Problem: Schizophrenia (HCC) Diagnosis:   Patient Active Problem List   Diagnosis Date Noted  . Hypertension [I10] 08/17/2015  . Diabetes (HCC) [E11.9] 08/17/2015  . Gout [M10.9] 08/17/2015  . Schizophrenia (HCC) [F20.9] 06/26/2014   Total Time spent with patient: 30  minutes  Past Psychiatric History: Patient has a long history of schizophrenia resistant to multiple medications. Last time I saw her she was taking clozapine and Risperdal together. Right now she is listed as only taking Haldol. I'm told that she had a recent hospitalization and has only been out of the hospital a short period of time. It's not clear whether she's ever seriously tried to hurt herself in the past although she has been aggressive and destructive of property when psychotic.  Past Medical History:  Past Medical History:  Diagnosis Date  . Anxiety   . Asthma   . Constipation   . DJD (degenerative joint disease)   . GERD (gastroesophageal reflux disease)   . Gout   . Hypertension   . Obesity   . Osteoporosis   . Schizoaffective disorder (HCC)   . Urinary incontinence     Past Surgical History:  Procedure Laterality Date  . ABDOMINAL HYSTERECTOMY     Family History: History reviewed. No pertinent family history.  Family Psychiatric  History: not known  Social History: Patient I believe has a guardian or at least a care giver looking after her. She has had multiple hospitalizations and multiple group homes but has lost position several times because of aggression. History  Alcohol Use  . Yes    Comment: "sometimes"     History  Drug Use No    Social History   Social History  . Marital status: Single    Spouse name: N/A  . Number of children: N/A  . Years of education: N/A   Social History Main Topics  . Smoking status:  Current Some Day Smoker    Types: Cigarettes  . Smokeless tobacco: Never Used  . Alcohol use Yes     Comment: "sometimes"  . Drug use: No  . Sexual activity: No   Other Topics Concern  . None   Social History Narrative  . None   Additional Social History:    Pain Medications: see PTA meds Prescriptions: see PTA meds Over the Counter: see PTA meds History of alcohol / drug use?: No history of alcohol / drug abuse     Current  Medications: Current Facility-Administered Medications  Medication Dose Route Frequency Provider Last Rate Last Dose  . acetaminophen (TYLENOL) tablet 650 mg  650 mg Oral Q6H PRN Audery AmelJohn T Clapacs, MD   650 mg at 08/21/15 0936  . allopurinol (ZYLOPRIM) tablet 50 mg  50 mg Oral BID Audery AmelJohn T Clapacs, MD   50 mg at 08/25/15 0846  . alum & mag hydroxide-simeth (MAALOX/MYLANTA) 200-200-20 MG/5ML suspension 30 mL  30 mL Oral Q4H PRN Audery AmelJohn T Clapacs, MD      . aspirin EC tablet 81 mg  81 mg Oral Daily Audery AmelJohn T Clapacs, MD   81 mg at 08/25/15 0845  . bacitracin ointment   Topical Daily Jimmy FootmanAndrea Hernandez-Gonzalez, MD   1 application at 08/25/15 [phone]  . cloZAPine (CLOZARIL) tablet 350 mg  350 mg Oral QHS Jimmy FootmanAndrea Hernandez-Gonzalez, MD      . desmopressin (DDAVP) 0.01 % (10 mcg/activation) spray 2 spray  2 spray Nasal QHS Jimmy FootmanAndrea Hernandez-Gonzalez, MD   2 spray at 08/24/15 2200  . diphenhydrAMINE (BENADRYL) capsule 50 mg  50 mg Oral Q6H PRN Jimmy FootmanAndrea Hernandez-Gonzalez, MD   50 mg at 08/23/15 1348   Or  . diphenhydrAMINE (BENADRYL) injection 50 mg  50 mg Intramuscular Q6H PRN Jimmy FootmanAndrea Hernandez-Gonzalez, MD      . divalproex (DEPAKOTE ER) 24 hr tablet 1,500 mg  1,500 mg Oral QHS Jimmy FootmanAndrea Hernandez-Gonzalez, MD      . docusate sodium (COLACE) capsule 200 mg  200 mg Oral BID Jimmy FootmanAndrea Hernandez-Gonzalez, MD   200 mg at 08/25/15 0845  . haloperidol (HALDOL) tablet 10 mg  10 mg Oral BID Jimmy FootmanAndrea Hernandez-Gonzalez, MD   10 mg at 08/25/15 0845  . haloperidol (HALDOL) tablet 5 mg  5 mg Oral Q6H PRN Jimmy FootmanAndrea Hernandez-Gonzalez, MD   5 mg at 08/23/15 1348   Or  . haloperidol lactate (HALDOL) injection 5 mg  5 mg Intramuscular Q6H PRN Jimmy FootmanAndrea Hernandez-Gonzalez, MD      . LORazepam (ATIVAN) tablet 2 mg  2 mg Oral Q6H PRN Jimmy FootmanAndrea Hernandez-Gonzalez, MD   2 mg at 08/24/15 2306   Or  . LORazepam (ATIVAN) injection 2 mg  2 mg Intramuscular Q6H PRN Jimmy FootmanAndrea Hernandez-Gonzalez, MD      . magnesium hydroxide (MILK OF MAGNESIA) suspension 30 mL  30  mL Oral Daily PRN Audery AmelJohn T Clapacs, MD      . metFORMIN (GLUCOPHAGE) tablet 500 mg  500 mg Oral BID WC Jimmy FootmanAndrea Hernandez-Gonzalez, MD   500 mg at 08/25/15 0845  . metoprolol succinate (TOPROL-XL) 24 hr tablet 12.5 mg  12.5 mg Oral Daily Jimmy FootmanAndrea Hernandez-Gonzalez, MD      . nicotine (NICODERM CQ - dosed in mg/24 hours) patch 21 mg  21 mg Transdermal Daily Jolanta B Pucilowska, MD   21 mg at 08/25/15 0848  . pantoprazole (PROTONIX) EC tablet 40 mg  40 mg Oral Daily Audery AmelJohn T Clapacs, MD   40 mg at 08/25/15 0847  . polyethylene glycol (  MIRALAX / GLYCOLAX) packet 17 g  17 g Oral Daily Jimmy Footman, MD   17 g at 08/25/15 0849  . sulfamethoxazole-trimethoprim (BACTRIM DS,SEPTRA DS) 800-160 MG per tablet 1 tablet  1 tablet Oral Q12H Audery Amel, MD   1 tablet at 08/25/15 0845    Lab Results:  Results for orders placed or performed during the hospital encounter of 08/17/15 (from the past 48 hour(s))  CBC with Differential/Platelet     Status: Abnormal   Collection Time: 08/25/15 10:07 AM  Result Value Ref Range   WBC 13.4 (H) 3.6 - 11.0 K/uL   RBC 4.01 3.80 - 5.20 MIL/uL   Hemoglobin 11.4 (L) 12.0 - 16.0 g/dL   HCT 16.1 (L) 09.6 - 04.5 %   MCV 85.7 80.0 - 100.0 fL   MCH 28.5 26.0 - 34.0 pg   MCHC 33.2 32.0 - 36.0 g/dL   RDW 40.9 (H) 81.1 - 91.4 %   Platelets 529 (H) 150 - 440 K/uL   Neutrophils Relative % 63 %   Neutro Abs 8.5 (H) 1.4 - 6.5 K/uL   Lymphocytes Relative 27 %   Lymphs Abs 3.7 (H) 1.0 - 3.6 K/uL   Monocytes Relative 6 %   Monocytes Absolute 0.9 0.2 - 0.9 K/uL   Eosinophils Relative 3 %   Eosinophils Absolute 0.3 0 - 0.7 K/uL   Basophils Relative 1 %   Basophils Absolute 0.1 0 - 0.1 K/uL    Blood Alcohol level:  Lab Results  Component Value Date   ETH <5 08/16/2015   ETH <5 09/19/2014    Metabolic Disorder Labs: Lab Results  Component Value Date   HGBA1C 6.5 (H) 08/18/2015   Lab Results  Component Value Date   PROLACTIN 50.7 (H) 08/18/2015   Lab  Results  Component Value Date   CHOL 179 08/18/2015   TRIG 103 08/18/2015   HDL 36 (L) 08/18/2015   CHOLHDL 5.0 08/18/2015   VLDL 21 08/18/2015   LDLCALC 122 (H) 08/18/2015    Physical Findings: AIMS: Facial and Oral Movements Muscles of Facial Expression: None, normal Lips and Perioral Area: None, normal Jaw: None, normal Tongue: None, normal,Extremity Movements Upper (arms, wrists, hands, fingers): None, normal Lower (legs, knees, ankles, toes): None, normal, Trunk Movements Neck, shoulders, hips: None, normal, Overall Severity Severity of abnormal movements (highest score from questions above): None, normal Incapacitation due to abnormal movements: None, normal Patient's awareness of abnormal movements (rate only patient's report): No Awareness, Dental Status Current problems with teeth and/or dentures?: No Does patient usually wear dentures?: No  CIWA:    COWS:     Musculoskeletal: Strength & Muscle Tone: within normal limits Gait & Station: normal Patient leans: N/A  Psychiatric Specialty Exam: Physical Exam  Nursing note and vitals reviewed. Constitutional: She appears well-developed and well-nourished.  HENT:  Head: Normocephalic and atraumatic.  Eyes: EOM are normal.  Neck: Normal range of motion.  Respiratory: Effort normal.  Neurological: She is alert.    Review of Systems  Unable to perform ROS: Acuity of condition    Blood pressure (!) 107/57, pulse 99, temperature 98.2 F (36.8 C), resp. rate 20, height 5\' 9"  (1.753 m), weight 118.4 kg (261 lb), SpO2 98 %.Body mass index is 38.54 kg/m.  General Appearance: Fairly Groomed  Eye Contact:  Fair  Speech:  Pressured  Volume:  Increased  Mood:  Anxious  Affect:  Congruent  Thought Process:  Disorganized, Irrelevant and Descriptions of Associations: Loose  Orientation:  NA  Thought Content:  Illogical  Suicidal Thoughts:  N/a  Homicidal Thoughts:  N/A  Memory:  NA  Judgement:  Impaired  Insight:   Lacking  Psychomotor Activity:  Increased  Concentration:  Concentration: Poor and Attention Span: Poor  Recall:  Poor  Fund of Knowledge:  Poor  Language:  Poor  Akathisia:  No  Handed:    AIMS (if indicated):     Assets:  Financial Resources/Insurance Social Support  ADL's:  Intact  Cognition:  Impaired,  Moderate  Sleep:  Number of Hours: 6     Treatment Plan Summary:  Schizophrenia: continue haldol but will start decreasing it.  Will change to 5 mg bid. Patient has been restarted on Clozaril. Her dose prior to admission was 350 mg. Plan to taper off haldol once clozaril dose becomes therapeutic.  Today she is on clozaril 350 mg   Plan to start depakote to target mood: we will start Depakote 1500 mg daily at bedtime  EPS: no evidence of EPS, will d/c amantadine  Sialorrhea: will order ipatropium sublingual at bedtime  Agitation and aggression the patient will be started on Haldol 5 mg every 6 hours, Ativan 2 mg every 6 hours and Benadryl every 6 hours as needed for aggression, agitation  HTN: continue metoprolol 12.5 mg  Metabolic syndrome: continue metformin 500 mg po bid  GERD: continue protonix 40 mg  Gout: continue allopurinol  Constipation: continue miralax daily and  colace 200 mg po bid  Abscess: Per ER notes: exam does show evidence of new right inframammary superficial abscess that was drained on 8/14 while in the ER.  Now on Bactrim. Will need daily dressing change  Urine incontinence: UA clear on 8/18.  Likely enuresis due to clozaril.  Continue desmopressin 1 spray on each nostril at night  Diet pt has been complaining about her diet---will change to regular  Precautions: q 15 m checks  Unsteady gait: continue using rolling walker, continue  fall precautions.  OK for pt to use wheelchair as needed   Hospital status: IVC  Labs: ordered Hba1c, lipid panel, prolactin, TSH  Disposition: Referral made to [facility] (state  facility)  Follow-up: To be determined  EKG ordered on 8/22  ---Pt is currently psychiatrically unstable and is not able to complete tobacco screening, SW screening or nursing assessment----  Jimmy Footman, MD 08/25/2015, 3:21 PM

## 2015-08-25 NOTE — Progress Notes (Signed)
[facility] in the milieu, loud, speech rambling, mumbling and slurred. "I am hungry, I missed my breakfast because I was feeling so good, they tried to wake me up. I did not." Patient denied pain. Will monitor.

## 2015-08-26 MED ORDER — DIPHENHYDRAMINE HCL 50 MG/ML IJ SOLN: 25.0000 mg | Freq: Four times a day (QID) | INTRAMUSCULAR | Status: DC | PRN

## 2015-08-26 MED ORDER — SULFAMETHOXAZOLE-TRIMETHOPRIM 800-160 MG PO TABS
1.0000 | ORAL_TABLET | Freq: Two times a day (BID) | ORAL | Status: AC
  Administered 2015-08-26 – 2015-08-27 (×3): 1 via ORAL
  Filled 2015-08-26 (×3): qty 1

## 2015-08-26 MED ORDER — METOPROLOL SUCCINATE ER 25 MG PO TB24
25.0000 mg | ORAL_TABLET | Freq: Every day | ORAL | Status: DC
  Administered 2015-08-27 – 2015-09-01 (×6): 25 mg via ORAL
  Filled 2015-08-26 (×6): qty 1

## 2015-08-26 MED ORDER — DIPHENHYDRAMINE HCL 25 MG PO CAPS: 25.0000 mg | ORAL_CAPSULE | Freq: Four times a day (QID) | ORAL | Status: DC | PRN

## 2015-08-26 MED ORDER — HALOPERIDOL 5 MG PO TABS
5.0000 mg | ORAL_TABLET | Freq: Every day | ORAL | Status: DC
  Administered 2015-08-26 – 2015-08-27 (×2): 5 mg via ORAL
  Filled 2015-08-26 (×2): qty 1

## 2015-08-26 MED ORDER — METOPROLOL SUCCINATE ER 25 MG PO TB24
12.5000 mg | ORAL_TABLET | Freq: Once | ORAL | Status: AC
  Administered 2015-08-26: 12.5 mg via ORAL

## 2015-08-26 NOTE — [facility] Group Notes (Signed)
[facility] LCSW Group Therapy  08/26/2015 10:54 AM  Type of Therapy:  Group Therapy  Participation Level:  Pt did not attend group. CSW invited pt to group.   Summary of Progress/Problems:Balance in life: Patients will discuss the concept of balance and how it looks and feels to be unbalanced. Pt will identify areas in their life that is unbalanced and ways to become more balanced.    Jamiee Milholland G. Garnette CzechSampson MSW, LCSWA 08/26/2015, 10:55 AM

## 2015-08-26 NOTE — Plan of Care (Signed)
Problem: Safety: Goal: Periods of time without injury will increase Outcome: Progressing Pt remains free from walk. Transfers safely from bed to wheelchair.  Problem: Role Relationship: Goal: Ability to interact with others will improve Outcome: Not Progressing Pt does not interact appropriately with staff. Displays argumentative behavior.

## 2015-08-26 NOTE — Plan of Care (Signed)
Problem: Nutrition: Goal: Adequate nutrition will be maintained Outcome: Progressing Patient receives balance  meals during shift

## 2015-08-26 NOTE — [facility] Group Notes (Signed)
[facility] Group Notes:  (Nursing/MHT/Case Management/Adjunct)  Date:  08/26/2015  Time:  4:15 PM  Type of Therapy:  Psychoeducational Skills  Participation Level:  Did Not Attend  Amy Villarreal 08/26/2015, 4:15 PM

## 2015-08-26 NOTE — [facility] Group Notes (Signed)
[facility] LCSW Group Therapy Note  Type of Therapy and Topic:  Group Therapy:  Goals Group: SMART Goals  Participation Level:  Pt did not attend group. CSW invited pt to group.   Description of Group:    The purpose of a daily goals group is to assist and guide patients in setting recovery/wellness-related goals.  The objective is to set goals as they relate to the crisis in which they were admitted. Patients will be using SMART goal modalities to set measurable goals.  Characteristics of realistic goals will be discussed and patients will be assisted in setting and processing how one will reach their goal. Facilitator will also assist patients in applying interventions and coping skills learned in psycho-education groups to the SMART goal and process how one will achieve defined goal.  Therapeutic Goals: -Patients will develop and document one goal related to or their crisis in which brought them into treatment. -Patients will be guided by LCSW using SMART goal setting modality in how to set a measurable, attainable, realistic and time sensitive goal.  -Patients will process barriers in reaching goal. -Patients will process interventions in how to overcome and successful in reaching goal.   Summary of Patient Progress:  Patient Goal: Pt did not attend group. CSW invited pt to group.    Therapeutic Modalities:   Motivational Interviewing  Engineer, manufacturing systemsCognitive Behavioral Therapy Crisis Intervention Model SMART goals setting  Enas Winchel G. Garnette CzechSampson MSW, [facility], Inc[facility]WA 08/26/2015 10:53 AM

## 2015-08-26 NOTE — Progress Notes (Signed)
D: Patient yelling loud at staff  From room . Rambling, pressured speech  Noted in wheel chair Patient  Requesting staff assist her with her clothing . Encourage independence  Requested that she make effort to put  Her clothes on  Staff remained with patient . Patient began to yell again at staff  "Get the hell out of my room "   Patient yelling to top of lungs , calling on security to  Apprehend another peer because she felt she had stolen a juice . Refused to go to room to take a nap . Patient remained in front of nursing station  Snoring .f.  A: Encourage patient participation with unit programming . Instruction  Given on  Medication , verbalize understanding. R: Voice no other concerns. Staff continue to monitor

## 2015-08-26 NOTE — Progress Notes (Signed)
[facility] MD Progress Note  08/26/2015 8:52 AM Amy Villarreal  MRN:  [id number]   Subjective: On Monday Ms. Burgessis agitated, loud, disruptive, and threatening. She demands a new room and wants to kill herself. She has been arguing with nursing staff and security guard. She was trying to throw her walker at nurses. She is very disorganized in her thinking unable to answer simple questions, very intrusive. Per nursing:Trashed room, water soaked with a lot of towels in the bathroom, BSC bucket in bed and at the [facility], disorganized, loose association - tangential type, FOI, speech slurred and mumbled, voiding on the bed, stuffed up the toilet; Environmental Services cleaned twice, 3 staff-assist to pick up soaked towels, bed made and BSC placed at EOB for easy access, will schedule toilet assist every 2 hours, monitor more frequently for falls.  On Tuesday  Patient threw milk to one of the techs.    Today thought process is less disorganized but pt continues to be impulsive and unpredictable.  Speech continues to be pressure. Tody she refused to meet with me during lunch and later in the afternoon she wanted to talk.  Once we started talking she ask me a question about her wheelchair and then said "that is it, we are done".  Per nurses this morning she was yelling and calling security as she was accusing another pt of stealing her juice.  Pt is able to walk with wheelchair but has been requesting to use wheelchair.  She pushes the wheelchair with her feet.  Per nursing last night:Observed in the [facility] and about in the milieu, loud, speech rambling, mumbling and slurred. "I am hungry, I missed my breakfast because I was feeling so good, they tried to wake me up. I did not." Patient denied pain. Will monitor.  Principal Problem: Schizophrenia (HCC) Diagnosis:   Patient Active Problem List   Diagnosis Date Noted  . Hypertension [I10] 08/17/2015  . Diabetes (HCC) [E11.9] 08/17/2015  . Gout [M10.9] 08/17/2015   . Schizophrenia (HCC) [F20.9] 06/26/2014   Total Time spent with patient: 30 minutes  Past Psychiatric History: Patient has a long history of schizophrenia resistant to multiple medications. Last time I saw her she was taking clozapine and Risperdal together. Right now she is listed as only taking Haldol. I'm told that she had a recent hospitalization and has only been out of the hospital a short period of time. It's not clear whether she's ever seriously tried to hurt herself in the past although she has been aggressive and destructive of property when psychotic.  Past Medical History:  Past Medical History:  Diagnosis Date  . Anxiety   . Asthma   . Constipation   . DJD (degenerative joint disease)   . GERD (gastroesophageal reflux disease)   . Gout   . Hypertension   . Obesity   . Osteoporosis   . Schizoaffective disorder (HCC)   . Urinary incontinence     Past Surgical History:  Procedure Laterality Date  . ABDOMINAL HYSTERECTOMY     Family History: History reviewed. No pertinent family history.  Family Psychiatric  History: not known  Social History: Patient I believe has a guardian or at least a care giver looking after her. She has had multiple hospitalizations and multiple group homes but has lost position several times because of aggression. History  Alcohol Use  . Yes    Comment: "sometimes"     History  Drug Use No    Social History  Social History  . Marital status: Single    Spouse name: N/A  . Number of children: N/A  . Years of education: N/A   Social History Main Topics  . Smoking status: Current Some Day Smoker    Types: Cigarettes  . Smokeless tobacco: Never Used  . Alcohol use Yes     Comment: "sometimes"  . Drug use: No  . Sexual activity: No   Other Topics Concern  . None   Social History Narrative  . None   Additional Social History:    Pain Medications: see PTA meds Prescriptions: see PTA meds Over the Counter: see PTA  meds History of alcohol / drug use?: No history of alcohol / drug abuse     Current Medications: Current Facility-Administered Medications  Medication Dose Route Frequency Provider Last Rate Last Dose  . acetaminophen (TYLENOL) tablet 650 mg  650 mg Oral Q6H PRN Audery Amel, MD   650 mg at 08/21/15 0936  . allopurinol (ZYLOPRIM) tablet 50 mg  50 mg Oral BID Audery Amel, MD   50 mg at 08/26/15 0814  . alum & mag hydroxide-simeth (MAALOX/MYLANTA) 200-200-20 MG/5ML suspension 30 mL  30 mL Oral Q4H PRN Audery Amel, MD      . aspirin EC tablet 81 mg  81 mg Oral Daily Audery Amel, MD   81 mg at 08/26/15 0813  . bacitracin ointment   Topical Daily Jimmy Footman, MD   1 application at 08/26/15 [id number]  . cloZAPine (CLOZARIL) tablet 350 mg  350 mg Oral QHS Jimmy Footman, MD   350 mg at 08/25/15 2158  . desmopressin (DDAVP) 0.01 % (10 mcg/activation) spray 2 spray  2 spray Nasal QHS Jimmy Footman, MD   2 spray at 08/25/15 2157  . diphenhydrAMINE (BENADRYL) capsule 50 mg  50 mg Oral Q6H PRN Jimmy Footman, MD   50 mg at 08/23/15 1348   Or  . diphenhydrAMINE (BENADRYL) injection 50 mg  50 mg Intramuscular Q6H PRN Jimmy Footman, MD      . divalproex (DEPAKOTE ER) 24 hr tablet 1,500 mg  1,500 mg Oral QHS Jimmy Footman, MD   1,500 mg at 08/25/15 2159  . docusate sodium (COLACE) capsule 200 mg  200 mg Oral BID Jimmy Footman, MD   200 mg at 08/26/15 0813  . haloperidol (HALDOL) tablet 5 mg  5 mg Oral Q6H PRN Jimmy Footman, MD   5 mg at 08/23/15 1348   Or  . haloperidol lactate (HALDOL) injection 5 mg  5 mg Intramuscular Q6H PRN Jimmy Footman, MD      . haloperidol (HALDOL) tablet 5 mg  5 mg Oral BID Jimmy Footman, MD   5 mg at 08/26/15 0816  . ipratropium (ATROVENT) 0.06 % nasal spray 1 spray  1 spray Each Nare QHS Jimmy Footman, MD   1 spray at 08/25/15 2201  .  LORazepam (ATIVAN) tablet 2 mg  2 mg Oral Q6H PRN Jimmy Footman, MD   2 mg at 08/24/15 2306   Or  . LORazepam (ATIVAN) injection 2 mg  2 mg Intramuscular Q6H PRN Jimmy Footman, MD      . magnesium hydroxide (MILK OF MAGNESIA) suspension 30 mL  30 mL Oral Daily PRN Audery Amel, MD      . metFORMIN (GLUCOPHAGE) tablet 500 mg  500 mg Oral BID WC Jimmy Footman, MD   500 mg at 08/26/15 0809  . metoprolol succinate (TOPROL-XL) 24 hr tablet 12.5 mg  12.5  mg Oral Once Jimmy Footman, MD      . Melene Muller ON 08/27/2015] metoprolol succinate (TOPROL-XL) 24 hr tablet 25 mg  25 mg Oral Daily Jimmy Footman, MD      . nicotine (NICODERM CQ - dosed in mg/24 hours) patch 21 mg  21 mg Transdermal Daily Jolanta B Pucilowska, MD   21 mg at 08/25/15 0848  . pantoprazole (PROTONIX) EC tablet 40 mg  40 mg Oral Daily Audery Amel, MD   40 mg at 08/26/15 0815  . polyethylene glycol (MIRALAX / GLYCOLAX) packet 17 g  17 g Oral Daily Jimmy Footman, MD   17 g at 08/25/15 0849  . sulfamethoxazole-trimethoprim (BACTRIM DS,SEPTRA DS) 800-160 MG per tablet 1 tablet  1 tablet Oral Q12H Audery Amel, MD   1 tablet at 08/26/15 1610    Lab Results:  Results for orders placed or performed during the hospital encounter of 08/17/15 (from the past 48 hour(s))  CBC with Differential/Platelet     Status: Abnormal   Collection Time: 08/25/15 10:07 AM  Result Value Ref Range   WBC 13.4 (H) 3.6 - 11.0 K/uL   RBC 4.01 3.80 - 5.20 MIL/uL   Hemoglobin 11.4 (L) 12.0 - 16.0 g/dL   HCT 96.0 (L) 45.4 - 09.8 %   MCV 85.7 80.0 - 100.0 fL   MCH 28.5 26.0 - 34.0 pg   MCHC 33.2 32.0 - 36.0 g/dL   RDW 11.9 (H) 14.7 - 82.9 %   Platelets 529 (H) 150 - 440 K/uL   Neutrophils Relative % 63 %   Neutro Abs 8.5 (H) 1.4 - 6.5 K/uL   Lymphocytes Relative 27 %   Lymphs Abs 3.7 (H) 1.0 - 3.6 K/uL   Monocytes Relative 6 %   Monocytes Absolute 0.9 0.2 - 0.9 K/uL   Eosinophils  Relative 3 %   Eosinophils Absolute 0.3 0 - 0.7 K/uL   Basophils Relative 1 %   Basophils Absolute 0.1 0 - 0.1 K/uL    Blood Alcohol level:  Lab Results  Component Value Date   ETH <5 08/16/2015   ETH <5 09/19/2014    Metabolic Disorder Labs: Lab Results  Component Value Date   HGBA1C 6.5 (H) 08/18/2015   Lab Results  Component Value Date   PROLACTIN 50.7 (H) 08/18/2015   Lab Results  Component Value Date   CHOL 179 08/18/2015   TRIG 103 08/18/2015   HDL 36 (L) 08/18/2015   CHOLHDL 5.0 08/18/2015   VLDL 21 08/18/2015   LDLCALC 122 (H) 08/18/2015    Physical Findings: AIMS: Facial and Oral Movements Muscles of Facial Expression: None, normal Lips and Perioral Area: None, normal Jaw: None, normal Tongue: None, normal,Extremity Movements Upper (arms, wrists, hands, fingers): None, normal Lower (legs, knees, ankles, toes): None, normal, Trunk Movements Neck, shoulders, hips: None, normal, Overall Severity Severity of abnormal movements (highest score from questions above): None, normal Incapacitation due to abnormal movements: None, normal Patient's awareness of abnormal movements (rate only patient's report): No Awareness, Dental Status Current problems with teeth and/or dentures?: No Does patient usually wear dentures?: No  CIWA:    COWS:     Musculoskeletal: Strength & Muscle Tone: within normal limits Gait & Station: normal Patient leans: N/A  Psychiatric Specialty Exam: Physical Exam  Nursing note and vitals reviewed. Constitutional: She appears well-developed and well-nourished.  HENT:  Head: Normocephalic and atraumatic.  Eyes: EOM are normal.  Neck: Normal range of motion.  Respiratory: Effort normal.  Neurological:  She is alert.    Review of Systems  Unable to perform ROS: Acuity of condition    Blood pressure (!) 107/91, pulse 99, temperature 98.7 F (37.1 C), temperature source Oral, resp. rate 20, height 5\' 9"  (1.753 m), weight 118.4 kg  (261 lb), SpO2 99 %.Body mass index is 38.54 kg/m.  General Appearance: Fairly Groomed  Eye Contact:  Fair  Speech:  Pressured  Volume:  Increased  Mood:  Anxious  Affect:  Congruent  Thought Process:  Disorganized, Irrelevant and Descriptions of Associations: Loose  Orientation:  NA  Thought Content:  Illogical  Suicidal Thoughts:  N/a  Homicidal Thoughts:  N/A  Memory:  NA  Judgement:  Impaired  Insight:  Lacking  Psychomotor Activity:  Increased  Concentration:  Concentration: Poor and Attention Span: Poor  Recall:  Poor  Fund of Knowledge:  Poor  Language:  Poor  Akathisia:  No  Handed:    AIMS (if indicated):     Assets:  Financial Resources/Insurance Social Support  ADL's:  Intact  Cognition:  Impaired,  Moderate  Sleep:  Number of Hours: 6.45     Treatment Plan Summary:  Schizophrenia: continue haldol but will start decreasing it. Will decrease to 5 mg qhs. Patient has been restarted on Clozaril. Her dose prior to admission was 350 mg. Plan to taper off haldol once clozaril dose becomes therapeutic.  Today she is on clozaril 350 mg   Plan to start depakote to target mood: continue Depakote 1500 mg daily at bedtime. Will need a level on Monday night.  Also will need ammonia level  Sialorrhea: continue  ipatropium sublingual at bedtime  Agitation and aggression the patient will be started on Haldol 5 mg every 6 hours, Ativan 2 mg every 6 hours and Benadryl every 6 hours as needed for aggression, agitation  HTN: continue metoprolol but will increase to 25 mg q am  Metabolic syndrome: continue metformin 500 mg po bid  GERD: continue protonix 40 mg  Gout: continue allopurinol  Constipation: continue miralax daily and  colace 200 mg po bid  Abscess: Per ER notes: exam does show evidence of new right inframammary superficial abscess that was drained on 8/14 while in the ER.  Now on Bactrim. Will need daily dressing change  Urine incontinence: UA clear on  8/18.  Guardian reports this has been an chronic issue.  Continue desmopressin 1 spray on each nostril at night  Diet pt has been complaining about her diet---now on  regular  Precautions: q 15 m checks  Unsteady gait: continue using rolling walker, continue  fall precautions.  OK for pt to use wheelchair as needed   Hospital status: IVC  Labs: ordered Hba1c, lipid panel, prolactin, TSH  [facility]  Follow-up: To be determined  EKG ordered on 8/22  ---Pt is currently psychiatrically unstable and is not able to complete tobacco screening, SW screening or nursing assessment----  [name],  Berenize Gatlin, MD 08/26/2015, 8:52 AM

## 2015-08-26 NOTE — Progress Notes (Signed)
Recreation Therapy Notes  Date: 08.24.17 Time: 9:30 am Location: Craft Room  Group Topic: Leisure Education  Goal Area(s) Addresses:  Patient will identify things they are grateful for. Patient will be educated on why it is important to be grateful.  Behavioral Response: Did not attend  Intervention: Grateful Wheel  Activity: Patients were given an I Am Grateful For worksheet and instructed to write things they were grateful for under each category.  Education: LRT educated patients on why it is important to be grateful.   Education Outcome: Patient did not attend group.   Clinical Observations/Feedback: Patient did not attend group.  Jacquelynn CreeGreene,Krithik Mapel M, LRT/CTRS 08/26/2015 10:23 AM

## 2015-08-27 NOTE — Progress Notes (Addendum)
[facility] MD Progress Note  08/27/2015 8:49 AM Amy Villarreal  MRN:  [id number]   Subjective: On Monday Amy Villarreal agitated, loud, disruptive, and threatening. She demands a new room and wants to kill herself. She has been arguing with nursing staff and security guard. She was trying to throw her walker at nurses. She is very disorganized in her thinking unable to answer simple questions, very intrusive. Per nursing:Trashed room, water soaked with a lot of towels in the bathroom, BSC bucket in bed and at the [facility], disorganized, loose association - tangential type, FOI, speech slurred and mumbled, voiding on the bed, stuffed up the toilet; Environmental Services cleaned twice, 3 staff-assist to pick up soaked towels, bed made and BSC placed at EOB for easy access, will schedule toilet assist every 2 hours, monitor more frequently for falls.  On Tuesday  Patient threw milk to one of the techs.    Last night she was loud in the day room, verbally abusive to staff.  This morning she told me to "get the heck way from her". Thought process is less disorganized but pt continues to be impulsive and unpredictable.  Speech continues to be pressure.   Compliant with medications.  Pt is able to walk with wheelchair but has been requesting to use wheelchair.  She pushes the wheelchair with her feet.  Per nursing last night:  D: Pt continues to be loud and threatening, calling several staff members "stupid bitch." She sits in the dayroom during the evening. Pt uses a walker to get around the unit. Takes medications as prescribed. A: Behavioral expectations reviewed with pt. Medications administered with education. q15 minute safety checks maintained. R: Pt remains free from harm. Will continue to monitor.  Principal Problem: Schizophrenia (HCC) Diagnosis:   Patient Active Problem List   Diagnosis Date Noted  . Hypertension [I10] 08/17/2015  . Diabetes (HCC) [E11.9] 08/17/2015  . Gout [M10.9] 08/17/2015  .  Schizophrenia (HCC) [F20.9] 06/26/2014   Total Time spent with patient: 30 minutes  Past Psychiatric History: Patient has a long history of schizophrenia resistant to multiple medications. Last time I saw her she was taking clozapine and Risperdal together. Right now she is listed as only taking Haldol. I'm told that she had a recent hospitalization and has only been out of the hospital a short period of time. It's not clear whether she's ever seriously tried to hurt herself in the past although she has been aggressive and destructive of property when psychotic.  Past Medical History:  Past Medical History:  Diagnosis Date  . Anxiety   . Asthma   . Constipation   . DJD (degenerative joint disease)   . GERD (gastroesophageal reflux disease)   . Gout   . Hypertension   . Obesity   . Osteoporosis   . Schizoaffective disorder (HCC)   . Urinary incontinence     Past Surgical History:  Procedure Laterality Date  . ABDOMINAL HYSTERECTOMY     Family History: History reviewed. No pertinent family history.  Family Psychiatric  History: not known  Social History: Patient I believe has a guardian or at least a care giver looking after her. She has had multiple hospitalizations and multiple group homes but has lost position several times because of aggression. History  Alcohol Use  . Yes    Comment: "sometimes"     History  Drug Use No    Social History   Social History  . Marital status: Single    Spouse  name: N/A  . Number of children: N/A  . Years of education: N/A   Social History Main Topics  . Smoking status: Current Some Day Smoker    Types: Cigarettes  . Smokeless tobacco: Never Used  . Alcohol use Yes     Comment: "sometimes"  . Drug use: No  . Sexual activity: No   Other Topics Concern  . None   Social History Narrative  . None   Additional Social History:    Pain Medications: see PTA meds Prescriptions: see PTA meds Over the Counter: see PTA  meds History of alcohol / drug use?: No history of alcohol / drug abuse     Current Medications: Current Facility-Administered Medications  Medication Dose Route Frequency Provider Last Rate Last Dose  . acetaminophen (TYLENOL) tablet 650 mg  650 mg Oral Q6H PRN Audery AmelJohn T Clapacs, MD   650 mg at 08/21/15 0936  . allopurinol (ZYLOPRIM) tablet 50 mg  50 mg Oral BID Audery AmelJohn T Clapacs, MD   50 mg at 08/26/15 2134  . alum & mag hydroxide-simeth (MAALOX/MYLANTA) 200-200-20 MG/5ML suspension 30 mL  30 mL Oral Q4H PRN Audery AmelJohn T Clapacs, MD      . aspirin EC tablet 81 mg  81 mg Oral Daily Audery AmelJohn T Clapacs, MD   81 mg at 08/26/15 0813  . bacitracin ointment   Topical Daily Jimmy FootmanAndrea Hernandez-Gonzalez, MD   1 application at 08/26/15 [phone]  . cloZAPine (CLOZARIL) tablet 350 mg  350 mg Oral QHS Jimmy FootmanAndrea Hernandez-Gonzalez, MD   350 mg at 08/26/15 2133  . desmopressin (DDAVP) 0.01 % (10 mcg/activation) spray 2 spray  2 spray Nasal QHS Jimmy FootmanAndrea Hernandez-Gonzalez, MD   2 spray at 08/26/15 2134  . diphenhydrAMINE (BENADRYL) injection 25 mg  25 mg Intramuscular Q6H PRN Jimmy FootmanAndrea Hernandez-Gonzalez, MD       Or  . diphenhydrAMINE (BENADRYL) capsule 25 mg  25 mg Oral Q6H PRN Jimmy FootmanAndrea Hernandez-Gonzalez, MD      . divalproex (DEPAKOTE ER) 24 hr tablet 1,500 mg  1,500 mg Oral QHS Jimmy FootmanAndrea Hernandez-Gonzalez, MD   1,500 mg at 08/26/15 2133  . docusate sodium (COLACE) capsule 200 mg  200 mg Oral BID Jimmy FootmanAndrea Hernandez-Gonzalez, MD   200 mg at 08/26/15 2133  . haloperidol (HALDOL) tablet 5 mg  5 mg Oral Q6H PRN Jimmy FootmanAndrea Hernandez-Gonzalez, MD   5 mg at 08/23/15 1348   Or  . haloperidol lactate (HALDOL) injection 5 mg  5 mg Intramuscular Q6H PRN Jimmy FootmanAndrea Hernandez-Gonzalez, MD      . haloperidol (HALDOL) tablet 5 mg  5 mg Oral QHS Jimmy FootmanAndrea Hernandez-Gonzalez, MD   5 mg at 08/26/15 2134  . ipratropium (ATROVENT) 0.06 % nasal spray 1 spray  1 spray Each Nare QHS Jimmy FootmanAndrea Hernandez-Gonzalez, MD   1 spray at 08/26/15 2134  . LORazepam (ATIVAN) tablet 2  mg  2 mg Oral Q6H PRN Jimmy FootmanAndrea Hernandez-Gonzalez, MD   2 mg at 08/24/15 2306   Or  . LORazepam (ATIVAN) injection 2 mg  2 mg Intramuscular Q6H PRN Jimmy FootmanAndrea Hernandez-Gonzalez, MD      . magnesium hydroxide (MILK OF MAGNESIA) suspension 30 mL  30 mL Oral Daily PRN Audery AmelJohn T Clapacs, MD      . metFORMIN (GLUCOPHAGE) tablet 500 mg  500 mg Oral BID WC Jimmy FootmanAndrea Hernandez-Gonzalez, MD   500 mg at 08/26/15 1650  . metoprolol succinate (TOPROL-XL) 24 hr tablet 25 mg  25 mg Oral Daily Jimmy FootmanAndrea Hernandez-Gonzalez, MD      . nicotine (NICODERM  CQ - dosed in mg/24 hours) patch 21 mg  21 mg Transdermal Daily Shari Prows, MD   21 mg at 08/26/15 0936  . pantoprazole (PROTONIX) EC tablet 40 mg  40 mg Oral Daily Audery Amel, MD   40 mg at 08/26/15 0815  . polyethylene glycol (MIRALAX / GLYCOLAX) packet 17 g  17 g Oral Daily Jimmy Footman, MD   17 g at 08/26/15 1610  . sulfamethoxazole-trimethoprim (BACTRIM DS,SEPTRA DS) 800-160 MG per tablet 1 tablet  1 tablet Oral Q12H Jimmy Footman, MD   1 tablet at 08/26/15 2133    Lab Results:  Results for orders placed or performed during the hospital encounter of 08/17/15 (from the past 48 hour(s))  CBC with Differential/Platelet     Status: Abnormal   Collection Time: 08/25/15 10:07 AM  Result Value Ref Range   WBC 13.4 (H) 3.6 - 11.0 K/uL   RBC 4.01 3.80 - 5.20 MIL/uL   Hemoglobin 11.4 (L) 12.0 - 16.0 g/dL   HCT 96.0 (L) 45.4 - 09.8 %   MCV 85.7 80.0 - 100.0 fL   MCH 28.5 26.0 - 34.0 pg   MCHC 33.2 32.0 - 36.0 g/dL   RDW 11.9 (H) 14.7 - 82.9 %   Platelets 529 (H) 150 - 440 K/uL   Neutrophils Relative % 63 %   Neutro Abs 8.5 (H) 1.4 - 6.5 K/uL   Lymphocytes Relative 27 %   Lymphs Abs 3.7 (H) 1.0 - 3.6 K/uL   Monocytes Relative 6 %   Monocytes Absolute 0.9 0.2 - 0.9 K/uL   Eosinophils Relative 3 %   Eosinophils Absolute 0.3 0 - 0.7 K/uL   Basophils Relative 1 %   Basophils Absolute 0.1 0 - 0.1 K/uL    Blood Alcohol level:  Lab  Results  Component Value Date   [facility] <5 08/16/2015   ETH <5 09/19/2014    Metabolic Disorder Labs: Lab Results  Component Value Date   HGBA1C 6.5 (H) 08/18/2015   Lab Results  Component Value Date   PROLACTIN 50.7 (H) 08/18/2015   Lab Results  Component Value Date   CHOL 179 08/18/2015   TRIG 103 08/18/2015   HDL 36 (L) 08/18/2015   CHOLHDL 5.0 08/18/2015   VLDL 21 08/18/2015   LDLCALC 122 (H) 08/18/2015    Physical Findings: AIMS: Facial and Oral Movements Muscles of Facial Expression: None, normal Lips and Perioral Area: None, normal Jaw: None, normal Tongue: None, normal,Extremity Movements Upper (arms, wrists, hands, fingers): None, normal Lower (legs, knees, ankles, toes): None, normal, Trunk Movements Neck, shoulders, hips: None, normal, Overall Severity Severity of abnormal movements (highest score from questions above): None, normal Incapacitation due to abnormal movements: None, normal Patient's awareness of abnormal movements (rate only patient's report): No Awareness, Dental Status Current problems with teeth and/or dentures?: No Does patient usually wear dentures?: No  CIWA:    COWS:     Musculoskeletal: Strength & Muscle Tone: within normal limits Gait & Station: normal Patient leans: N/A  Psychiatric Specialty Exam: Physical Exam  Nursing note and vitals reviewed. Constitutional: She is oriented to person, place, and time. She appears well-developed and well-nourished.  HENT:  Head: Normocephalic and atraumatic.  Eyes: Conjunctivae and EOM are normal.  Neck: Normal range of motion.  Respiratory: Effort normal.  Musculoskeletal: Normal range of motion.  Neurological: She is alert and oriented to person, place, and time.    Review of Systems  Unable to perform ROS: Acuity of condition  Blood pressure 112/75, pulse (!) 102, temperature 97.7 F (36.5 C), temperature source Oral, resp. rate 20, height 5\' 9"  (1.753 m), weight 118.4 kg (261 lb),  SpO2 99 %.Body mass index is 38.54 kg/m.  General Appearance: Fairly Groomed  Eye Contact:  Fair  Speech:  Pressured  Volume:  Increased  Mood:  Anxious  Affect:  Congruent  Thought Process:  Disorganized, Irrelevant and Descriptions of Associations: Loose  Orientation:  NA  Thought Content:  Illogical  Suicidal Thoughts:  N/a  Homicidal Thoughts:  N/A  Memory:  NA  Judgement:  Impaired  Insight:  Lacking  Psychomotor Activity:  Increased  Concentration:  Concentration: Poor and Attention Span: Poor  Recall:  Poor  Fund of Knowledge:  Poor  Language:  Poor  Akathisia:  No  Handed:    AIMS (if indicated):     Assets:  Financial Resources/Insurance Social Support  ADL's:  Intact  Cognition:  Impaired,  Moderate  Sleep:  Number of Hours: 7.5     Treatment Plan Summary:  No changes, pt is verbally aggressive, threatening staff, rude to staff and peers, impulsive and unpredictable. Awaiting for admission at [facility].  Schizoaffective disorder bipolar type : continue haldol 5 mg qhs. Patient has been restarted on Clozaril. Her dose prior to admission was 350 mg. Plan to taper off haldol once clozaril dose becomes therapeutic.  Continue clozaril 350 mg   Plan to start depakote to target mood: continue Depakote 1500 mg daily at bedtime. Will need a level on Monday night.  Also will need ammonia level  Sialorrhea: continue  ipatropium sublingual at bedtime  Agitation and aggression the patient will be started on Haldol 5 mg every 6 hours, Ativan 2 mg every 6 hours and Benadryl every 6 hours as needed for aggression, agitation  HTN: continue metoprolol 25 mg q am  Metabolic syndrome: continue metformin 500 mg po bid  GERD: continue protonix 40 mg  Gout: continue allopurinol  Constipation: continue miralax daily and  colace 200 mg po bid  Abscess: Per ER notes: exam does show evidence of new right inframammary superficial abscess that was drained on 8/14 while in the ER.   Now on Bactrim. Will need daily dressing change  Urine incontinence: UA clear on 8/18.  Guardian reports this has been an chronic issue.  Continue desmopressin 1 spray on each nostril at night as clozaril is likely to worsen it by causing enuresis.  Diet pt has been complaining about her diet---now on  regular  Precautions: q 15 m checks  Unsteady gait: continue using rolling walker, continue  fall precautions.  OK for pt to use wheelchair as needed   Hospital status: IVC  Labs: ordered Hba1c, lipid panel, prolactin, TSH  Disposition: Referral [facility] to [facility] facility[facility]  Follow-up: To be determined  EKG ordered on 8/22  Labs: Will order depakote level, BMP and ammonia level on Monday night  ---Pt is currently psychiatrically unstable and is not able to complete tobacco screening, SW screening or nursing assessment----  Jimmy Footman, MD 08/27/2015, 8:49 AM

## 2015-08-27 NOTE — Progress Notes (Signed)
Recreation Therapy Notes  Date: 08.25.17 Time: 9:30 am Location: Craft Room  Group Topic: Coping Skills  Goal Area(s) Addresses:  Patient will participate in healthy coping skill. Patient will verbalize use of art as a coping skill.  Behavioral Response: Did not attend  Intervention: Coloring  Activity: Patients were given coloring sheets to color. Patients were instructed to think about the emotions they were feeling and what their mind was focused on while they were coloring.  Education: LRT educated patients on healthy coping skills.  Education Outcome: Patient did not attend group.   Clinical Observations/Feedback: Patient did not attend group.  Jacquelynn CreeGreene,Milton Sagona M, LRT/CTRS 08/27/2015 10:10 AM

## 2015-08-27 NOTE — Progress Notes (Signed)
D: Pt continues to be loud and threatening, calling several staff members "stupid bitch." She sits in the dayroom during the evening. Pt uses a walker to get around the unit. Takes medications as prescribed. A: Behavioral expectations reviewed with pt. Medications administered with education. q15 minute safety checks maintained. R: Pt remains free from harm. Will continue to monitor.

## 2015-08-27 NOTE — Tx Team (Signed)
Interdisciplinary Treatment Plan Update (Adult)         Date: 08/27/2015   Time Reviewed: 10:30 AM   Progress in Treatment: Improving Attending groups: Yes  Participating in groups: Yes  Taking medication as prescribed: Yes  Tolerating medication: Yes  Family/Significant other contact made: CSW spoke with legal guardian Patient understands diagnosis: Yes  Discussing patient identified problems/goals with staff: Yes  Medical problems stabilized or resolved: Yes  Denies suicidal/homicidal ideation: Yes  Issues/concerns per patient self-inventory: Yes  Other:   New problem(s) identified: N/A   Discharge Plan or Barriers: see below   Reason for Continuation of Hospitalization:   Depression   Anxiety   Medication Stabilization   Comments: N/A   Estimated length of stay: 3-5 days    Patient is a 50 year old female admitted for aggressive behaviors with a diagnosis of schizophrenia. Patient lives in [place], [place]. Patient will benefit from crisis stabilization, medication evaluation, group therapy, and psycho education in addition to case management for discharge planning. Patient and CSW reviewed pt's identified goals and treatment plan. Pt verbalized understanding and agreed to treatment plan.    Review of initial/current patient goals per problem list:  1. Goal(s): Patient will participate in aftercare plan   Met: No  Target date: 3-5 days post admission date   As evidenced by: Patient will participate within aftercare plan AEB aftercare provider and housing plan at discharge being identified.   CSW assessing proper aftercare plans.  2. Goal (s): Patient will exhibit decreased depressive symptoms and suicidal ideations.   Met: Goal progressing   Target date: 3-5 days post admission date   As evidenced by: Patient will utilize self-rating of depression at 3 or below and demonstrate decreased signs of depression or be deemed stable for discharge by MD.   Pt's  depression score of 4 at this time. Denies SI/HI at this time.  3. Goal(s): Patient will demonstrate decreased signs and symptoms of anxiety.   Met: No  Target date: 3-5 days post admission date   As evidenced by: Patient will utilize self-rating of anxiety at 3 or below and demonstrated decreased signs of anxiety, or be deemed stable for discharge by MD   Pt has an anxiety score of 5 at this time.   5. Goal(s): Patient will demonstrate decreased signs of psychosis  * Met: No * Target date: 3-5 days post admission date  * As evidenced by: Patient will demonstrate decreased frequency of AVH or return to baseline function   Pt is still psychotic at this time - AVH at this time.    Attendees:  Patient: Amy Villarreal  Family:  Physician: Merlyn Albert, MD    08/27/2015 10:30AM  Nursing: Amy Radon, RN     08/27/2015 10:30AM  Clinical Social Worker: Glorious Peach, [place]  08/27/2015 10:30AM  Other: Recreational Therapist, Everitt Amber  08/27/2015 10:30AM

## 2015-08-27 NOTE — Plan of Care (Signed)
Problem: Coping: Goal: Ability to demonstrate self-control will improve Outcome: Not Progressing Pt unable to identify or use coping skills.  Problem: Nutritional: Goal: Ability to achieve adequate nutritional intake will improve Outcome: Progressing Pt accepts snacks this evening.

## 2015-08-28 NOTE — Plan of Care (Signed)
Problem: Health Behavior/Discharge Planning: Goal: Ability to manage health-related needs will improve Outcome: Not Progressing Patient unwilling to actively participate in plan of care.

## 2015-08-28 NOTE — Progress Notes (Addendum)
[facility] MD Progress Note  08/28/2015 9:36 AM Amy Villarreal  MRN:  [id number]   Subjective: On Monday Amy Villarreal agitated, loud, disruptive, and threatening. She demands a new room and wants to kill herself. She has been arguing with nursing staff and security guard. She was trying to throw her walker at nurses. She is very disorganized in her thinking unable to answer simple questions, very intrusive. Per nursing:Trashed room, water soaked with a lot of towels in the bathroom, BSC bucket in bed and at the [facility], disorganized, loose association - tangential type, FOI, speech slurred and mumbled, voiding on the bed, stuffed up the toilet; Environmental Services cleaned twice, 3 staff-assist to pick up soaked towels, bed made and BSC placed at EOB for easy access, will schedule toilet assist every 2 hours, monitor more frequently for falls.  On Tuesday  Amy Villarreal threw milk to one of the techs.    Thursday night she was loud in the day room, verbally abusive to staff.  This morning she told me to "get the heck way from her". Thought process is less disorganized but pt continues to be impulsive and unpredictable.  Speech continues to be pressure.   Compliant with medications.  This morning she refused to get up from bed and speak with me.  Pt is able to walk with walker but has been requesting to use wheelchair.  She pushes the wheelchair with her feet.  Per nursing last night:  D: Pt is seen in the dayroom most of this evening. She is compliant with her prescribed medications. Pt continues to be impulsive and verbally abusive towards staff. A: Emotional support and encouragement provided. Medications administered with education. q15 minute safety checks maintained. R: Pt remains free from harm. Will continue to monitor.  Principal Problem: Schizophrenia (HCC) Diagnosis:   Amy Villarreal Active Problem List   Diagnosis Date Noted  . Hypertension [I10] 08/17/2015  . Diabetes (HCC) [E11.9] 08/17/2015  .  Gout [M10.9] 08/17/2015  . Schizophrenia (HCC) [F20.9] 06/26/2014   Total Time spent with Amy Villarreal: 30 minutes  Past Psychiatric History: Amy Villarreal has a long history of schizophrenia resistant to multiple medications. Last time I saw her she was taking clozapine and Risperdal together. Right now she is listed as only taking Haldol. I'm told that she had a recent hospitalization and has only been out of the hospital a short period of time. It's not clear whether she's ever seriously tried to hurt herself in the past although she has been aggressive and destructive of property when psychotic.  Past Medical History:  Past Medical History:  Diagnosis Date  . Anxiety   . Asthma   . Constipation   . DJD (degenerative joint disease)   . GERD (gastroesophageal reflux disease)   . Gout   . Hypertension   . Obesity   . Osteoporosis   . Schizoaffective disorder (HCC)   . Urinary incontinence     Past Surgical History:  Procedure Laterality Date  . ABDOMINAL HYSTERECTOMY     Family History: History reviewed. No pertinent family history.  Family Psychiatric  History: not known  Social History: Amy Villarreal I believe has a guardian or at least a care giver looking after her. She has had multiple hospitalizations and multiple group homes but has lost position several times because of aggression. History  Alcohol Use  . Yes    Comment: "sometimes"     History  Drug Use No    Social History   Social History  .  Marital status: Single    Spouse name: N/A  . Number of children: N/A  . Years of education: N/A   Social History Main Topics  . Smoking status: Current Some Day Smoker    Types: Cigarettes  . Smokeless tobacco: Never Used  . Alcohol use Yes     Comment: "sometimes"  . Drug use: No  . Sexual activity: No   Other Topics Concern  . None   Social History Narrative  . None   Additional Social History:    Pain Medications: see PTA meds Prescriptions: see PTA meds Over the  Counter: see PTA meds History of alcohol / drug use?: No history of alcohol / drug abuse     Current Medications: Current Facility-Administered Medications  Medication Dose Route Frequency Provider Last Rate Last Dose  . acetaminophen (TYLENOL) tablet 650 mg  650 mg Oral Q6H PRN Audery Amel, MD   650 mg at 08/21/15 0936  . allopurinol (ZYLOPRIM) tablet 50 mg  50 mg Oral BID Audery Amel, MD   50 mg at 08/27/15 2243  . alum & mag hydroxide-simeth (MAALOX/MYLANTA) 200-200-20 MG/5ML suspension 30 mL  30 mL Oral Q4H PRN Audery Amel, MD      . aspirin EC tablet 81 mg  81 mg Oral Daily Audery Amel, MD   81 mg at 08/27/15 1000  . bacitracin ointment   Topical Daily Jimmy Footman, MD   1 application at 08/26/15 [phone]  . cloZAPine (CLOZARIL) tablet 350 mg  350 mg Oral QHS Jimmy Footman, MD   350 mg at 08/27/15 2239  . desmopressin (DDAVP) 0.01 % (10 mcg/activation) spray 2 spray  2 spray Nasal QHS Jimmy Footman, MD   2 spray at 08/27/15 2244  . diphenhydrAMINE (BENADRYL) injection 25 mg  25 mg Intramuscular Q6H PRN Jimmy Footman, MD       Or  . diphenhydrAMINE (BENADRYL) capsule 25 mg  25 mg Oral Q6H PRN Jimmy Footman, MD      . divalproex (DEPAKOTE ER) 24 hr tablet 1,500 mg  1,500 mg Oral QHS Jimmy Footman, MD   1,500 mg at 08/27/15 2241  . docusate sodium (COLACE) capsule 200 mg  200 mg Oral BID Jimmy Footman, MD   200 mg at 08/27/15 2240  . haloperidol (HALDOL) tablet 5 mg  5 mg Oral Q6H PRN Jimmy Footman, MD   5 mg at 08/23/15 1348   Or  . haloperidol lactate (HALDOL) injection 5 mg  5 mg Intramuscular Q6H PRN Jimmy Footman, MD      . haloperidol (HALDOL) tablet 5 mg  5 mg Oral QHS Jimmy Footman, MD   5 mg at 08/27/15 2241  . ipratropium (ATROVENT) 0.06 % nasal spray 1 spray  1 spray Each Nare QHS Jimmy Footman, MD   1 spray at 08/27/15 2244  . LORazepam  (ATIVAN) tablet 2 mg  2 mg Oral Q6H PRN Jimmy Footman, MD   2 mg at 08/24/15 2306   Or  . LORazepam (ATIVAN) injection 2 mg  2 mg Intramuscular Q6H PRN Jimmy Footman, MD      . magnesium hydroxide (MILK OF MAGNESIA) suspension 30 mL  30 mL Oral Daily PRN Audery Amel, MD      . metFORMIN (GLUCOPHAGE) tablet 500 mg  500 mg Oral BID WC Jimmy Footman, MD   500 mg at 08/27/15 1725  . metoprolol succinate (TOPROL-XL) 24 hr tablet 25 mg  25 mg Oral Daily Jimmy Footman, MD  25 mg at 08/27/15 0800  . nicotine (NICODERM CQ - dosed in mg/24 hours) patch 21 mg  21 mg Transdermal Daily Shari Prows, MD   21 mg at 08/26/15 0936  . pantoprazole (PROTONIX) EC tablet 40 mg  40 mg Oral Daily Audery Amel, MD   40 mg at 08/27/15 1000  . polyethylene glycol (MIRALAX / GLYCOLAX) packet 17 g  17 g Oral Daily Jimmy Footman, MD   17 g at 08/27/15 1000    Lab Results:  No results found for this or any previous visit (from the past 48 hour(s)).  Blood Alcohol level:  Lab Results  Component Value Date   [facility] <5 08/16/2015   ETH <5 09/19/2014    Metabolic Disorder Labs: Lab Results  Component Value Date   HGBA1C 6.5 (H) 08/18/2015   Lab Results  Component Value Date   PROLACTIN 50.7 (H) 08/18/2015   Lab Results  Component Value Date   CHOL 179 08/18/2015   TRIG 103 08/18/2015   HDL 36 (L) 08/18/2015   CHOLHDL 5.0 08/18/2015   VLDL 21 08/18/2015   LDLCALC 122 (H) 08/18/2015    Physical Findings: AIMS: Facial and Oral Movements Muscles of Facial Expression: None, normal Lips and Perioral Area: None, normal Jaw: None, normal Tongue: None, normal,Extremity Movements Upper (arms, wrists, hands, fingers): None, normal Lower (legs, knees, ankles, toes): None, normal, Trunk Movements Neck, shoulders, hips: None, normal, Overall Severity Severity of abnormal movements (highest score from questions above): None,  normal Incapacitation due to abnormal movements: None, normal Amy Villarreal's awareness of abnormal movements (rate only Amy Villarreal's report): No Awareness, Dental Status Current problems with teeth and/or dentures?: No Does Amy Villarreal usually wear dentures?: No  CIWA:    COWS:     Musculoskeletal: Strength & Muscle Tone: within normal limits Gait & Station: normal Amy Villarreal leans: N/A  Psychiatric Specialty Exam: Physical Exam  Nursing note and vitals reviewed. Constitutional: She is oriented to person, place, and time. She appears well-developed and well-nourished.  HENT:  Head: Normocephalic and atraumatic.  Eyes: Conjunctivae and EOM are normal.  Neck: Normal range of motion.  Respiratory: Effort normal.  Musculoskeletal: Normal range of motion.  Neurological: She is alert and oriented to person, place, and time.    Review of Systems  Unable to perform ROS: Acuity of condition    Blood pressure 109/68, pulse (!) 110, temperature 97.2 F (36.2 C), resp. rate 18, height 5\' 9"  (1.753 m), weight 118.4 kg (261 lb), SpO2 99 %.Body mass index is 38.54 kg/m.  General Appearance: Fairly Groomed  Eye Contact:  Fair  Speech:  Pressured  Volume:  Increased  Mood:  Anxious  Affect:  Congruent  Thought Process:  Disorganized, Irrelevant and Descriptions of Associations: Loose  Orientation:  NA  Thought Content:  Illogical  Suicidal Thoughts:  N/a  Homicidal Thoughts:  N/A  Memory:  NA  Judgement:  Impaired  Insight:  Lacking  Psychomotor Activity:  Increased  Concentration:  Concentration: Poor and Attention Span: Poor  Recall:  Poor  Fund of Knowledge:  Poor  Language:  Poor  Akathisia:  No  Handed:    AIMS (if indicated):     Assets:  Financial Resources/Insurance Social Support  ADL's:  Intact  Cognition:  Impaired,  Moderate  Sleep:  Number of Hours: 8.25     Treatment Plan Summary:  No changes, pt is verbally aggressive, threatening staff, rude to staff and peers,  impulsive and unpredictable. Awaiting for admission at [facility].  Schizoaffective disorder bipolar type: will d/c haldol.  Amy Villarreal has been restarted on Clozaril. Her dose prior to admission was 350 mg. Plan to taper off haldol once clozaril dose becomes therapeutic.  Continue clozaril 350 mg   Plan to start depakote to target mood: continue Depakote 1500 mg daily at bedtime. Will need a level on Monday night.  Also will need ammonia level  Sialorrhea: continue  ipatropium sublingual at bedtime  Agitation and aggression: will continue with only ativan prn  HTN: continue metoprolol 25 mg q am  Metabolic syndrome: continue metformin 500 mg po bid  GERD: continue protonix 40 mg  Gout: continue allopurinol  Constipation: continue miralax daily and  colace 200 mg po bid  Abscess: Per ER notes: exam does show evidence of new right inframammary superficial abscess that was drained on 8/14 while in the ER. Completed bactrim  Urine incontinence: UA clear on 8/18.  Guardian reports this has been an chronic issue.  Continue desmopressin 1 spray on each nostril at night as clozaril is likely to worsen it by causing enuresis.  Diet pt has been complaining about her diet---now on  regular  Precautions: q 15 m checks  Unsteady gait: continue using rolling walker, continue  fall precautions.  OK for pt to use wheelchair as needed   Hospital status: IVC  Labs: ordered Hba1c, lipid panel, prolactin, TSH  [facility]  Follow-up: To be determined  EKG ordered on 8/22  Labs: Will order depakote level, BMP and ammonia level on Monday night  ---Pt is currently psychiatrically unstable and is not able to complete tobacco screening, SW screening or nursing assessment----  [name],  Indria Bishara, MD 08/28/2015, 9:36 AM

## 2015-08-28 NOTE — [facility] Group Notes (Signed)
[facility] Group Notes:  (Nursing/MHT/Case Management/Adjunct)  Date:  08/28/2015  Time:  9:19 PM  Type of Therapy:  Group Therapy  Participation Level:  Did Not Attend    Amy Villarreal 08/28/2015, 9:19 PM

## 2015-08-28 NOTE — [facility] Group Notes (Signed)
[facility] Group Notes:  (Nursing/MHT/Case Management/Adjunct)  Date:  08/28/2015  Time:  4:45 AM  Type of Therapy:  Psychoeducational Skills  Participation Level:  Active  Participation Quality:  Appropriate  Affect:  Appropriate  Cognitive:  Appropriate  Insight:  Appropriate  Engagement in Group:  Engaged  Modes of Intervention:  Discussion, Socialization and Support  Summary of Progress/Problems:  Chancy MilroyLaquanda Y Mellany Dinsmore 08/28/2015, 4:45 AM

## 2015-08-28 NOTE — Progress Notes (Signed)
D: Pt is seen in the dayroom most of this evening. She is compliant with her prescribed medications. Pt continues to be impulsive and verbally abusive towards staff. A: Emotional support and encouragement provided. Medications administered with education. q15 minute safety checks maintained. R: Pt remains free from harm. Will continue to monitor.

## 2015-08-28 NOTE — [facility] Group Notes (Signed)
[facility] LCSW Group Therapy  08/28/2015 2:11 PM  Type of Therapy:  Group Therapy  Participation Level:  None, Pt came to group for less than 5 minutes and then asked to leave the group. Pt did not participate in the group discussion.   Summary of Progress/Problems: Communications: Patients identify how individuals communicate with one another appropriately and inappropriately. Patients will be guided to discuss their thoughts, feelings, and behaviors related to barriers when communicating. The group will process together ways to execute positive and appropriate communications.   Willona Phariss G. Garnette CzechSampson MSW, LCSWA 08/28/2015, 2:13 PM

## 2015-08-28 NOTE — [facility] Group Notes (Signed)
[facility] Group Notes:  (Nursing/MHT/Case Management/Adjunct)  Date:  08/28/2015  Time:  5:15 PM  Type of Therapy:  Psychoeducational Skills  Participation Level:  Did Not Attend   Noelle PennerKristen J Jason Hauge 08/28/2015, 5:15 PM

## 2015-08-28 NOTE — Progress Notes (Addendum)
Patient with depressed affect, withdrawn and sleepy in bed. Refusing am meal. Does not get out of bed for am meal or am meds. Meds administered this am by Clinical research associatewriter with witness at side. No SI/HI/AVH at this time. Patient suspicious, irritable and demanding. Speech is mumbled and loud and  tangential, difficult to follow. Patient stating she is glad Clinical research associatewriter has another staff member with her.  Takes meds. Poor adls. Refuses shower and refuses to clean up her room. Not actively participating in plan of care. Safety maintained. Skin under breasts clean, dry and intact.

## 2015-08-29 NOTE — Progress Notes (Signed)
D: Pt denies SI/HI/AVH. Pt is pleasant and cooperative. Pt becomes loud at times but is redirectable. Pt has made some in appropriate remarks with sexual undertones that had to be redirected, but pt was manageable on the unit this evening. Pt requesting staff give her some Lays potato chips, writer explained to pt we did not have any on the unit.   A: Pt was offered support and encouragement. Pt was given scheduled medications. Pt was encourage to attend groups. Q 15 minute checks were done for safety.   R:Pt attends groups and interacts well with peers and staff. Pt is taking medication. Pt has no complaints at this time .Pt receptive to treatment and safety maintained on unit.

## 2015-08-29 NOTE — [facility] Group Notes (Signed)
[facility] LCSW Group Therapy  08/29/2015 2:08 PM  Type of Therapy:  Group Therapy  Participation level: Pt did not attend group. CSW invited pt to group.   Summary of Progress/Problems: Emotional Regulation: Patients will identify both negative and positive emotions. They will discuss emotions they have difficulty regulating and how they impact their lives. Patients will be asked to identify healthy coping skills to combat unhealthy reactions to negative emotions.  Latasha Puskas G. Garnette CzechSampson MSW, LCSWA 08/29/2015, 2:14 PM

## 2015-08-29 NOTE — Progress Notes (Signed)
[facility] MD Progress Note  08/29/2015 10:19 AM Amy Villarreal  MRN:  [id number]   Subjective:  Thought process is less disorganized but pt continues to be impulsive and unpredictable.  Speech continues to be pressure.   Compliant with medications and labs. This morning she said she was waiting for her medications and didn't want to talk to me.   Pt is able to walk with walker but has been requesting to use wheelchair.  She pushes the wheelchair with her feet.  Per nursing last night:  D: Patient is alert  , oriented to person on the unit this shift. Patient  Not attended and  participated in groups today. Patient denies suicidal ideation, homicidal ideation, auditory or visual hallucinations at the present time.  A: Scheduled medications are administered to patient as per MD orders. Emotional support and encouragement are provided. Patient is maintained on q.15 minute safety checks. Patient is informed to notify staff with questions or concerns. R: No adverse medication reactions are noted. Patient is cooperative with medication administration  ,not  treatment plan today. Patient is non receptive, anxious ,intrusive,demanding   on the unit at this time. Patient does not interact  with others on the unit this shift. Patient contracts for safety at this time. Patient remains safe at this time.  Principal Problem: Schizophrenia (HCC) Diagnosis:   Patient Active Problem List   Diagnosis Date Noted  . Hypertension [I10] 08/17/2015  . Diabetes (HCC) [E11.9] 08/17/2015  . Gout [M10.9] 08/17/2015  . Schizophrenia (HCC) [F20.9] 06/26/2014   Total Time spent with patient: 30 minutes  Past Psychiatric History: Patient has a long history of schizophrenia resistant to multiple medications. Last time I saw her she was taking clozapine and Risperdal together. Right now she is listed as only taking Haldol. I'm told that she had a recent hospitalization and has only been out of the hospital a short period of  time. It's not clear whether she's ever seriously tried to hurt herself in the past although she has been aggressive and destructive of property when psychotic.  Past Medical History:  Past Medical History:  Diagnosis Date  . Anxiety   . Asthma   . Constipation   . DJD (degenerative joint disease)   . GERD (gastroesophageal reflux disease)   . Gout   . Hypertension   . Obesity   . Osteoporosis   . Schizoaffective disorder (HCC)   . Urinary incontinence     Past Surgical History:  Procedure Laterality Date  . ABDOMINAL HYSTERECTOMY     Family History: History reviewed. No pertinent family history.  Family Psychiatric  History: not known  Social History: Patient I believe has a guardian or at least a care giver looking after her. She has had multiple hospitalizations and multiple group homes but has lost position several times because of aggression. History  Alcohol Use  . Yes    Comment: "sometimes"     History  Drug Use No    Social History   Social History  . Marital status: Single    Spouse name: N/A  . Number of children: N/A  . Years of education: N/A   Social History Main Topics  . Smoking status: Current Some Day Smoker    Types: Cigarettes  . Smokeless tobacco: Never Used  . Alcohol use Yes     Comment: "sometimes"  . Drug use: No  . Sexual activity: No   Other Topics Concern  . None   Social History Narrative  .  None   Additional Social History:    Pain Medications: see PTA meds Prescriptions: see PTA meds Over the Counter: see PTA meds History of alcohol / drug use?: No history of alcohol / drug abuse     Current Medications: Current Facility-Administered Medications  Medication Dose Route Frequency Provider Last Rate Last Dose  . acetaminophen (TYLENOL) tablet 650 mg  650 mg Oral Q6H PRN Audery Amel, MD   650 mg at 08/28/15 2018  . allopurinol (ZYLOPRIM) tablet 50 mg  50 mg Oral BID Audery Amel, MD   50 mg at 08/29/15 0912  . alum  & mag hydroxide-simeth (MAALOX/MYLANTA) 200-200-20 MG/5ML suspension 30 mL  30 mL Oral Q4H PRN Audery Amel, MD      . aspirin EC tablet 81 mg  81 mg Oral Daily Audery Amel, MD   81 mg at 08/29/15 0916  . cloZAPine (CLOZARIL) tablet 350 mg  350 mg Oral QHS Jimmy Footman, MD   350 mg at 08/28/15 2114  . desmopressin (DDAVP) 0.01 % (10 mcg/activation) spray 2 spray  2 spray Nasal QHS Jimmy Footman, MD   2 spray at 08/28/15 2113  . divalproex (DEPAKOTE ER) 24 hr tablet 1,500 mg  1,500 mg Oral QHS Jimmy Footman, MD   1,500 mg at 08/28/15 2118  . docusate sodium (COLACE) capsule 200 mg  200 mg Oral BID Jimmy Footman, MD   200 mg at 08/29/15 0912  . ipratropium (ATROVENT) 0.06 % nasal spray 1 spray  1 spray Each Nare QHS Jimmy Footman, MD   1 spray at 08/28/15 2114  . LORazepam (ATIVAN) tablet 2 mg  2 mg Oral Q6H PRN Jimmy Footman, MD   2 mg at 08/28/15 2117   Or  . LORazepam (ATIVAN) injection 2 mg  2 mg Intramuscular Q6H PRN Jimmy Footman, MD      . magnesium hydroxide (MILK OF MAGNESIA) suspension 30 mL  30 mL Oral Daily PRN Audery Amel, MD      . metFORMIN (GLUCOPHAGE) tablet 500 mg  500 mg Oral BID WC Jimmy Footman, MD   500 mg at 08/29/15 0913  . metoprolol succinate (TOPROL-XL) 24 hr tablet 25 mg  25 mg Oral Daily Jimmy Footman, MD   25 mg at 08/29/15 0912  . nicotine (NICODERM CQ - dosed in mg/24 hours) patch 21 mg  21 mg Transdermal Daily Shari Prows, MD   21 mg at 08/29/15 0911  . pantoprazole (PROTONIX) EC tablet 40 mg  40 mg Oral Daily Audery Amel, MD   40 mg at 08/29/15 0912  . polyethylene glycol (MIRALAX / GLYCOLAX) packet 17 g  17 g Oral Daily Jimmy Footman, MD   17 g at 08/29/15 0911    Lab Results:  No results found for this or any previous visit (from the past 48 hour(s)).  Blood Alcohol level:  Lab Results  Component Value Date   [facility] <5  08/16/2015   ETH <5 09/19/2014    Metabolic Disorder Labs: Lab Results  Component Value Date   HGBA1C 6.5 (H) 08/18/2015   Lab Results  Component Value Date   PROLACTIN 50.7 (H) 08/18/2015   Lab Results  Component Value Date   CHOL 179 08/18/2015   TRIG 103 08/18/2015   HDL 36 (L) 08/18/2015   CHOLHDL 5.0 08/18/2015   VLDL 21 08/18/2015   LDLCALC 122 (H) 08/18/2015    Physical Findings: AIMS: Facial and Oral Movements Muscles of Facial Expression: None,  normal Lips and Perioral Area: None, normal Jaw: None, normal Tongue: None, normal,Extremity Movements Upper (arms, wrists, hands, fingers): None, normal Lower (legs, knees, ankles, toes): None, normal, Trunk Movements Neck, shoulders, hips: None, normal, Overall Severity Severity of abnormal movements (highest score from questions above): None, normal Incapacitation due to abnormal movements: None, normal Patient's awareness of abnormal movements (rate only patient's report): No Awareness, Dental Status Current problems with teeth and/or dentures?: No Does patient usually wear dentures?: No  CIWA:    COWS:     Musculoskeletal: Strength & Muscle Tone: within normal limits Gait & Station: normal Patient leans: N/A  Psychiatric Specialty Exam: Physical Exam  Nursing note and vitals reviewed. Constitutional: She is oriented to person, place, and time. She appears well-developed and well-nourished.  HENT:  Head: Normocephalic and atraumatic.  Eyes: Conjunctivae and EOM are normal.  Neck: Normal range of motion.  Respiratory: Effort normal.  Musculoskeletal: Normal range of motion.  Neurological: She is alert and oriented to person, place, and time.    Review of Systems  Unable to perform ROS: Acuity of condition    Blood pressure 109/68, pulse (!) 110, temperature 98 F (36.7 C), temperature source Oral, resp. rate 18, height 5\' 9"  (1.753 m), weight 118.4 kg (261 lb), SpO2 99 %.Body mass index is 38.54 kg/m.   General Appearance: Fairly Groomed  Eye Contact:  Fair  Speech:  Pressured  Volume:  Increased  Mood:  Irritable  Affect:  Congruent  Thought Process:  Disorganized, Irrelevant and Descriptions of Associations: Loose  Orientation:  NA  Thought Content:  Illogical  Suicidal Thoughts:  N/a  Homicidal Thoughts:  N/A  Memory:  NA  Judgement:  Impaired  Insight:  Lacking  Psychomotor Activity:  Normal  Concentration:  Concentration: Poor and Attention Span: Poor  Recall:  Poor  Fund of Knowledge:  Poor  Language:  Poor  Akathisia:  No  Handed:    AIMS (if indicated):     Assets:  Financial Resources/Insurance Social Support  ADL's:  Intact  Cognition:  Impaired,  Moderate  Sleep:  Number of Hours: 7     Treatment Plan Summary:  No changes, pt is verbally aggressive, threatening staff, rude to staff and peers, impulsive and unpredictable. [facility].    Outburst are less frequent and less severe  Schizoaffective disorder bipolar type: Patient has been restarted on Clozaril. Her dose prior to admission was 350 mg.   Continue clozaril 350 mg   Plan to start depakote to target mood: continue Depakote 1500 mg daily at bedtime. Will need a level on Monday night.  Also will need ammonia level  Sialorrhea: continue  ipatropium sublingual at bedtime  Agitation and aggression: will continue with only ativan prn  HTN: continue metoprolol 25 mg q am  Metabolic syndrome: continue metformin 500 mg po bid  GERD: continue protonix 40 mg  Gout: continue allopurinol  Constipation: continue miralax daily and  colace 200 mg po bid  Abscess: Per ER notes: exam does show evidence of new right inframammary superficial abscess that was drained on 8/14 while in the ER. Completed bactrim.  No visible per nursing.  Will d/c dressing changes.   Urine incontinence: UA clear on 8/18.  Guardian reports this has been an chronic issue.  Continue desmopressin 1 spray on  each nostril at night as clozaril is likely to worsen it by causing enuresis.  Diet pt has been complaining about her diet---now on  regular  Precautions:  q 15 m checks  Unsteady gait: continue using rolling walker, continue  fall precautions.  OK for pt to use wheelchair as needed   Hospital status: IVC  Labs: ordered Hba1c, lipid panel, prolactin, TSH  [facility]  Follow-up: To be determined  EKG ordered on 8/22  Labs: Will order depakote level, BMP and ammonia level on Monday night  I certify that the services received since the previous certification/recertification were and continue to be medically necessary as the treatment provided can be reasonably expected to improve the patient's condition; the medical record documents that the services furnished were intensive treatment services or their equivalent services, and this patient continues to need, on a daily basis, active treatment furnished directly by or requiring the supervision of inpatient psychiatric personnel.   [name],  Cristian Davitt, MD 08/29/2015, 10:19 AM

## 2015-08-29 NOTE — Progress Notes (Signed)
D: Patient is alert  , oriented to person on the unit this shift. Patient  Not attended and  participated in groups today. Patient denies suicidal ideation, homicidal ideation, auditory or visual hallucinations at the present time.  A: Scheduled medications are administered to patient as per MD orders. Emotional support and encouragement are provided. Patient is maintained on q.15 minute safety checks. Patient is informed to notify staff with questions or concerns. R: No adverse medication reactions are noted. Patient is cooperative with medication administration  ,not  treatment plan today. Patient is non receptive, anxious ,intrusive,demanding   on the unit at this time. Patient does not interact  with others on the unit this shift. Patient contracts for safety at this time. Patient remains safe at this time.

## 2015-08-29 NOTE — Plan of Care (Signed)
Problem: Coping: Goal: Ability to verbalize frustrations and anger appropriately will improve Outcome: Progressing Pt stated she was ready to go, pt believes she has been her too long, but pt understands it is a placement issue at this time  Problem: Health Behavior/Discharge Planning: Goal: Compliance with prescribed medication regimen will improve Outcome: Progressing Pt taking medications as prescribed

## 2015-08-29 NOTE — [facility] Group Notes (Signed)
[facility] Group Notes:  (Nursing/MHT/Case Management/Adjunct)  Date:  08/29/2015  Time:  9:51 PM  Type of Therapy:  Evening Wrap-up Group  Participation Level:  Did Not Attend  Participation Quality:  N/A  Affect:  N/A  Cognitive:  N/A  Insight:  None  Engagement in Group:  Did Not Attend  Modes of Intervention:  Activity  Summary of Progress/Problems:  Tomasita MorrowChelsea Nanta Kamesha Herne 08/29/2015, 9:51 PM

## 2015-08-29 NOTE — Progress Notes (Signed)
Patient was loud, irritable and demanding. She refused to come to the med room for her medications despite being observed seated right outside the nursing station. She also refused for the writer to check/assess skin under her right breast. She angrily flashed her skin and stated, "see, there is is nothing there bicth." Patient observed in the dayroom watching TV. Minimal interaction with peers noted. Patient also had a clogged toilet from her stuffing things down. Maintenance called and the toilet was unclogged. Patient continues to utilize a wheelchair to get around. No falls reported. Tangential in thought and paranoid. Remains on every checks for safety.

## 2015-08-29 NOTE — Plan of Care (Signed)
Problem: Safety: Goal: Ability to remain free from injury will improve Outcome: Progressing Patient has not had any falls the patient instructed on fall safety precautions. Bedside commode at bedside , pt tranfers to wheelchair with assiastane. Pt does not use walker Tax adviserCTownsend RN

## 2015-08-29 NOTE — Plan of Care (Signed)
Problem: Safety: Goal: Ability to remain free from injury will improve Outcome: Progressing No falls reported thus far.

## 2015-08-30 NOTE — Social Work (Signed)
CSW contacted pt's care coordinator, [name]  to inquire about group home placements for patient. CSW left message and is awaiting call back.   Pt is still on [facility]'s priority wait list.   [name], MSW, LCSW-A 08/30/15   11:49AM

## 2015-08-30 NOTE — Plan of Care (Signed)
Problem: Health Behavior/Discharge Planning: Goal: Compliance with prescribed medication regimen will improve Outcome: Progressing Medication compliant. Mouth checks done after medication administration.

## 2015-08-30 NOTE — Progress Notes (Signed)
Recreation Therapy Notes  Date: 08.28.17 Time: 10:10 am Location: Craft Room  Group Topic: Self-expression  Goal Area(s) Addresses:  Patient will be able to identify a color that represents each emotion. Patient will verbalize benefit of using art as a means of self-expression. Patient will verbalize one emotion experienced while participating in activity.  Behavioral Response: Did not attend  Intervention: The Colors Within Me  Activity: Patients were given blank face worksheets and instructed to pick a color for each emotion they were feeling and show on the face how much of that emotion they were feeling.  Education: LRT educated patients on other forms of self-expression.  Education Outcome: Patient did not attend group.  Clinical Observations/Feedback: Patient did not attend group.  Jacquelynn CreeGreene,Yeriel Mineo M, LRT/CTRS 08/30/2015 10:48 AM

## 2015-08-30 NOTE — [facility] Group Notes (Signed)
[facility] Group Notes:  (Nursing/MHT/Case Management/Adjunct)  Date:  08/30/2015  Time:  9:18 PM  Type of Therapy:  Group Therapy  Participation Level:  Did Not Attend   Amy Villarreal Amy Villarreal 08/30/2015, 9:18 PM

## 2015-08-30 NOTE — Progress Notes (Signed)
Patient irritable this morning, was loud yelling and to be talked to/redirected. She continues to be disorganized in thought, loud pressured in speech and demanding. She slept this afternoon and woke up one time yelling. Ambulating and getting around via wheelchair, unwilling to participate in any activity. No falls reported. Medication education done but she was not receptive. Will continue to monitor on Q4115min observation to ensure safety.

## 2015-08-30 NOTE — Progress Notes (Signed)
[facility]  08/30/2015 8:49 AM Amy Villarreal R Amy Villarreal  MRN:  [id number]   Subjective:  Thought process is less disorganized but pt continues to be impulsive and unpredictable.  Speech continues to be pressure.   Compliant with medications and labs. This morning she was sleeping late in bed and didn't want to talk. C/o pain in legs and back.  Pt was not in any distress  Pt is able to walk with walker but has been requesting to use wheelchair.  She pushes the wheelchair with her feet.  Per nursing last night:  D: Pt denies SI/HI/AVH. Pt is pleasant and cooperative. Pt becomes loud at times but is redirectable. Pt has made some in appropriate remarks with sexual undertones that had to be redirected, but pt was manageable on the unit this evening. Pt requesting staff give her some Lays potato chips, writer explained to pt we did not have any on the unit.   A: Pt was offered support and encouragement. Pt was given scheduled medications. Pt was encourage to attend groups. Q 15 minute checks were done for safety.   R:Pt attends groups and interacts well with peers and staff. Pt is taking medication. Pt has no complaints at this time .Pt receptive to treatment and safety maintained on unit.  Principal Problem: Schizophrenia (HCC) Diagnosis:   Patient Active Problem List   Diagnosis Date Noted  . Hypertension [I10] 08/17/2015  . Diabetes (HCC) [E11.9] 08/17/2015  . Gout [M10.9] 08/17/2015  . Schizophrenia (HCC) [F20.9] 06/26/2014   Total Time spent with patient: 30 minutes  Past Psychiatric History: Patient has a long history of schizophrenia resistant to multiple medications. Last time I saw her she was taking clozapine and Risperdal together. Right now she is listed as only taking Haldol. I'm told that she had a recent hospitalization and has only been out of the hospital a short period of time. It's not clear whether she's ever seriously tried to hurt herself in the past although she has  been aggressive and destructive of property when psychotic.  Past Medical History:  Past Medical History:  Diagnosis Date  . Anxiety   . Asthma   . Constipation   . DJD (degenerative joint disease)   . GERD (gastroesophageal reflux disease)   . Gout   . Hypertension   . Obesity   . Osteoporosis   . Schizoaffective disorder (HCC)   . Urinary incontinence     Past Surgical History:  Procedure Laterality Date  . ABDOMINAL HYSTERECTOMY     Family History: History reviewed. No pertinent family history.  Family Psychiatric  History: not known  Social History: Patient I believe has a guardian or at least a care giver looking after her. She has had multiple hospitalizations and multiple group homes but has lost position several times because of aggression. History  Alcohol Use  . Yes    Comment: "sometimes"     History  Drug Use No    Social History   Social History  . Marital status: Single    Spouse name: N/A  . Number of children: N/A  . Years of education: N/A   Social History Main Topics  . Smoking status: Current Some Day Smoker    Types: Cigarettes  . Smokeless tobacco: Never Used  . Alcohol use Yes     Comment: "sometimes"  . Drug use: No  . Sexual activity: No   Other Topics Concern  . None   Social History Narrative  .  None   Additional Social History:    Pain Medications: see PTA meds Prescriptions: see PTA meds Over the Counter: see PTA meds History of alcohol / drug use?: No history of alcohol / drug abuse     Current Medications: Current Facility-Administered Medications  Medication Dose Route Frequency Provider Last Rate Last Dose  . acetaminophen (TYLENOL) tablet 650 mg  650 mg Oral Q6H PRN Audery Amel, MD   650 mg at 08/28/15 2018  . allopurinol (ZYLOPRIM) tablet 50 mg  50 mg Oral BID Audery Amel, MD   50 mg at 08/30/15 1610  . alum & mag hydroxide-simeth (MAALOX/MYLANTA) 200-200-20 MG/5ML suspension 30 mL  30 mL Oral Q4H PRN Audery Amel, MD      . aspirin EC tablet 81 mg  81 mg Oral Daily Audery Amel, MD   81 mg at 08/30/15 0839  . cloZAPine (CLOZARIL) tablet 350 mg  350 mg Oral QHS Jimmy Footman, MD   350 mg at 08/29/15 2212  . desmopressin (DDAVP) 0.01 % (10 mcg/activation) spray 2 spray  2 spray Nasal QHS Jimmy Footman, MD   2 spray at 08/29/15 2210  . divalproex (DEPAKOTE ER) 24 hr tablet 1,500 mg  1,500 mg Oral QHS Jimmy Footman, MD   1,500 mg at 08/29/15 2212  . docusate sodium (COLACE) capsule 200 mg  200 mg Oral BID Jimmy Footman, MD   200 mg at 08/30/15 9604  . ipratropium (ATROVENT) 0.06 % nasal spray 1 spray  1 spray Each Nare QHS Jimmy Footman, MD   1 spray at 08/29/15 2210  . LORazepam (ATIVAN) tablet 2 mg  2 mg Oral Q6H PRN Jimmy Footman, MD   2 mg at 08/29/15 2212   Or  . LORazepam (ATIVAN) injection 2 mg  2 mg Intramuscular Q6H PRN Jimmy Footman, MD      . magnesium hydroxide (MILK OF MAGNESIA) suspension 30 mL  30 mL Oral Daily PRN Audery Amel, MD      . metFORMIN (GLUCOPHAGE) tablet 500 mg  500 mg Oral BID WC Jimmy Footman, MD   500 mg at 08/30/15 5409  . metoprolol succinate (TOPROL-XL) 24 hr tablet 25 mg  25 mg Oral Daily Jimmy Footman, MD   25 mg at 08/30/15 8119  . nicotine (NICODERM CQ - dosed in mg/24 hours) patch 21 mg  21 mg Transdermal Daily Jolanta B Pucilowska, MD   21 mg at 08/29/15 0911  . pantoprazole (PROTONIX) EC tablet 40 mg  40 mg Oral Daily Audery Amel, MD   40 mg at 08/30/15 1478  . polyethylene glycol (MIRALAX / GLYCOLAX) packet 17 g  17 g Oral Daily Jimmy Footman, MD   17 g at 08/30/15 2956    Lab Results:  No results found for this or any previous visit (from the past 48 hour(s)).  Blood Alcohol level:  Lab Results  Component Value Date   [facility] <5 08/16/2015   ETH <5 09/19/2014    Metabolic Disorder Labs: Lab Results  Component Value Date    HGBA1C 6.5 (H) 08/18/2015   Lab Results  Component Value Date   PROLACTIN 50.7 (H) 08/18/2015   Lab Results  Component Value Date   CHOL 179 08/18/2015   TRIG 103 08/18/2015   HDL 36 (L) 08/18/2015   CHOLHDL 5.0 08/18/2015   VLDL 21 08/18/2015   LDLCALC 122 (H) 08/18/2015    Physical Findings: AIMS: Facial and Oral Movements Muscles of Facial Expression: None,  normal Lips and Perioral Area: None, normal Jaw: None, normal Tongue: None, normal,Extremity Movements Upper (arms, wrists, hands, fingers): None, normal Lower (legs, knees, ankles, toes): None, normal, Trunk Movements Neck, shoulders, hips: None, normal, Overall Severity Severity of abnormal movements (highest score from questions above): None, normal Incapacitation due to abnormal movements: None, normal Patient's awareness of abnormal movements (rate only patient's report): No Awareness, Dental Status Current problems with teeth and/or dentures?: No Does patient usually wear dentures?: No  CIWA:    COWS:     Musculoskeletal: Strength & Muscle Tone: within normal limits Gait & Station: normal Patient leans: N/A  Psychiatric Specialty Exam: Physical Exam  Nursing note and vitals reviewed. Constitutional: She is oriented to person, place, and time. She appears well-developed and well-nourished.  HENT:  Head: Normocephalic and atraumatic.  Eyes: Conjunctivae and EOM are normal.  Neck: Normal range of motion.  Respiratory: Effort normal.  Musculoskeletal: Normal range of motion.  Neurological: She is alert and oriented to person, place, and time.    Review of Systems  Unable to perform ROS: Acuity of condition    Blood pressure 112/66, pulse (!) 101, temperature 98 F (36.7 C), temperature source Oral, resp. rate 18, height 5\' 9"  (1.753 m), weight 118.4 kg (261 lb), SpO2 99 %.Body mass index is 38.54 kg/m.  General Appearance: Fairly Groomed  Eye Contact:  Fair  Speech:  Pressured  Volume:  Increased   Mood:  Irritable  Affect:  Congruent  Thought Process:  Irrelevant and Descriptions of Associations: Loose  Orientation:  NA  Thought Content:  Illogical  Suicidal Thoughts:  N/a  Homicidal Thoughts:  N/A  Memory:  NA  Judgement:  Impaired  Insight:  Lacking  Psychomotor Activity:  Normal  Concentration:  Concentration: Poor and Attention Span: Poor  Recall:  Poor  Fund of Knowledge:  Poor  Language:  Poor  Akathisia:  No  Handed:    AIMS (if indicated):     Assets:  Financial Resources/Insurance Social Support  ADL's:  Intact  Cognition:  Impaired,  Moderate  Sleep:  Number of Hours: 6.3     Treatment Plan Summary:  No changes, pt is verbally aggressive, threatening staff, rude to staff and peers, impulsive and unpredictable. Awaiting for admission at [facility].  Outburst are less frequent and less severe. Again uncooperative with me during assessment.  Schizoaffective disorder bipolar type: Patient has been restarted on Clozaril. Her dose prior to admission was 350 mg.   Continue clozaril 350 mg   Plan to start depakote to target mood: continue Depakote 1500 mg daily at bedtime. Will need a level on Monday night.  Also will need ammonia level  Sialorrhea: continue  ipatropium sublingual at bedtime  Agitation and aggression: will continue with only ativan prn  HTN: continue metoprolol 25 mg q am  Metabolic syndrome: continue metformin 500 mg po bid  GERD: continue protonix 40 mg  Gout: continue allopurinol  Constipation: continue miralax daily and  colace 200 mg po bid  Abscess: Per ER notes: exam does show evidence of new right inframammary superficial abscess that was drained on 8/14 while in the ER. Completed bactrim.  No visible per nursing.  Will d/c dressing changes.  RESOLVED  Urine incontinence: UA clear on 8/18.  Guardian reports this has been an chronic issue.  Continue desmopressin 1 spray on each nostril at night as clozaril is likely to worsen it by  causing enuresis.  Checking BMP to r/o hyponatremia  Diet pt has  been complaining about her diet---now on  regular  Precautions: q 15 m checks  Unsteady gait: continue using rolling walker, continue  fall precautions.  OK for pt to use wheelchair as needed   Hospital status: IVC  Labs: ordered Hba1c, lipid panel, prolactin, TSH.  Will check depakote, ammonia and BMP this evening  Disposition: Referral made to [facility] (state facility)  Follow-up: To be determined  EKG ordered on 8/22  Jimmy Footman, MD 08/30/2015, 8:49 AM

## 2015-08-31 LAB — BASIC METABOLIC PANEL
Anion gap: 9 (ref 5–15)
BUN: 10 mg/dL (ref 6–20)
CALCIUM: 9.4 mg/dL (ref 8.9–10.3)
CO2: 27 mmol/L (ref 22–32)
CREATININE: 0.57 mg/dL (ref 0.44–1.00)
Chloride: 100 mmol/L — ABNORMAL LOW (ref 101–111)
GLUCOSE: 112 mg/dL — AB (ref 65–99)
Potassium: 4.2 mmol/L (ref 3.5–5.1)
Sodium: 136 mmol/L (ref 135–145)

## 2015-08-31 LAB — CBC WITH DIFFERENTIAL/PLATELET
BASOS ABS: 0.2 10*3/uL — AB (ref 0–0.1)
BASOS PCT: 1 %
EOS ABS: 0.3 10*3/uL (ref 0–0.7)
Eosinophils Relative: 2 %
HCT: 35.7 % (ref 35.0–47.0)
Hemoglobin: 11.8 g/dL — ABNORMAL LOW (ref 12.0–16.0)
Lymphocytes Relative: 21 %
Lymphs Abs: 3.3 10*3/uL (ref 1.0–3.6)
MCH: 28.3 pg (ref 26.0–34.0)
MCHC: 32.9 g/dL (ref 32.0–36.0)
MCV: 85.9 fL (ref 80.0–100.0)
MONO ABS: 0.7 10*3/uL (ref 0.2–0.9)
MONOS PCT: 5 %
NEUTROS PCT: 71 %
Neutro Abs: 11.4 10*3/uL — ABNORMAL HIGH (ref 1.4–6.5)
Platelets: 488 10*3/uL — ABNORMAL HIGH (ref 150–440)
RBC: 4.16 MIL/uL (ref 3.80–5.20)
RDW: 14.7 % — ABNORMAL HIGH (ref 11.5–14.5)
WBC: 15.9 10*3/uL — ABNORMAL HIGH (ref 3.6–11.0)

## 2015-08-31 LAB — AMMONIA: AMMONIA: 44 umol/L — AB (ref 9–35)

## 2015-08-31 LAB — VALPROIC ACID LEVEL: Valproic Acid Lvl: 76 ug/mL (ref 50.0–100.0)

## 2015-08-31 NOTE — Progress Notes (Signed)
D: Patient is still very intrusive. Very tangential. She did not attend group. She denies SI/HI/AVH. Patient is very impulsive. She did not attend group. A: Medication given with education. Encouragement provided.  R: Patient was compliant with medication. Safety maintained with 15 min checks.

## 2015-08-31 NOTE — Progress Notes (Signed)
[facility] MD Progress Note  08/31/2015 2:13 PM Amy Villarreal  MRN:  [id number]   Subjective:  Thought process is less disorganized but pt continues to be impulsive and unpredictable.  Speech continues to be pressure.   Compliant with medications and labs. This morning she was sleeping late in bed and didn't want to talk much. She requested kool-aid and lays potato chips.  Pt is able to walk with walker but has been requesting to use wheelchair.  She pushes the wheelchair with her feet.  Per nursing last night:  D: Patient is still very intrusive. Very tangential. She did not attend group. She denies SI/HI/AVH. Patient is very impulsive. She did not attend group. A: Medication given with education. Encouragement provided.  R: Patient was compliant with medication. Safety maintained with 15 min checks.   Principal Problem: Schizophrenia (HCC) Diagnosis:   Patient Active Problem List   Diagnosis Date Noted  . Hypertension [I10] 08/17/2015  . Diabetes (HCC) [E11.9] 08/17/2015  . Gout [M10.9] 08/17/2015  . Schizophrenia (HCC) [F20.9] 06/26/2014   Total Time spent with patient: 30 minutes  Past Psychiatric History: Patient has a long history of schizophrenia resistant to multiple medications. Last time I saw her she was taking clozapine and Risperdal together. Right now she is listed as only taking Haldol. I'm told that she had a recent hospitalization and has only been out of the hospital a short period of time. It's not clear whether she's ever seriously tried to hurt herself in the past although she has been aggressive and destructive of property when psychotic.  Past Medical History:  Past Medical History:  Diagnosis Date  . Anxiety   . Asthma   . Constipation   . DJD (degenerative joint disease)   . GERD (gastroesophageal reflux disease)   . Gout   . Hypertension   . Obesity   . Osteoporosis   . Schizoaffective disorder (HCC)   . Urinary incontinence     Past Surgical History:   Procedure Laterality Date  . ABDOMINAL HYSTERECTOMY     Family History: History reviewed. No pertinent family history.  Family Psychiatric  History: not known  Social History: Patient I believe has a guardian or at least a care giver looking after her. She has had multiple hospitalizations and multiple group homes but has lost position several times because of aggression. History  Alcohol Use  . Yes    Comment: "sometimes"     History  Drug Use No    Social History   Social History  . Marital status: Single    Spouse name: N/A  . Number of children: N/A  . Years of education: N/A   Social History Main Topics  . Smoking status: Current Some Day Smoker    Types: Cigarettes  . Smokeless tobacco: Never Used  . Alcohol use Yes     Comment: "sometimes"  . Drug use: No  . Sexual activity: No   Other Topics Concern  . None   Social History Narrative  . None   Additional Social History:    Pain Medications: see PTA meds Prescriptions: see PTA meds Over the Counter: see PTA meds History of alcohol / drug use?: No history of alcohol / drug abuse     Current Medications: Current Facility-Administered Medications  Medication Dose Route Frequency Provider Last Rate Last Dose  . acetaminophen (TYLENOL) tablet 650 mg  650 mg Oral Q6H PRN Audery Amel, MD   650 mg at 08/31/15 1406  .  allopurinol (ZYLOPRIM) tablet 50 mg  50 mg Oral BID Audery Amel, MD   50 mg at 08/31/15 0908  . alum & mag hydroxide-simeth (MAALOX/MYLANTA) 200-200-20 MG/5ML suspension 30 mL  30 mL Oral Q4H PRN Audery Amel, MD      . aspirin EC tablet 81 mg  81 mg Oral Daily Audery Amel, MD   81 mg at 08/31/15 0907  . cloZAPine (CLOZARIL) tablet 350 mg  350 mg Oral QHS Jimmy Footman, MD   350 mg at 08/30/15 2229  . desmopressin (DDAVP) 0.01 % (10 mcg/activation) spray 2 spray  2 spray Nasal QHS Jimmy Footman, MD   2 spray at 08/30/15 2230  . divalproex (DEPAKOTE ER) 24 hr  tablet 1,500 mg  1,500 mg Oral QHS Jimmy Footman, MD   1,500 mg at 08/30/15 2227  . docusate sodium (COLACE) capsule 200 mg  200 mg Oral BID Jimmy Footman, MD   200 mg at 08/31/15 0907  . ipratropium (ATROVENT) 0.06 % nasal spray 1 spray  1 spray Each Nare QHS Jimmy Footman, MD   1 spray at 08/30/15 2229  . LORazepam (ATIVAN) tablet 2 mg  2 mg Oral Q6H PRN Jimmy Footman, MD   2 mg at 08/29/15 2212   Or  . LORazepam (ATIVAN) injection 2 mg  2 mg Intramuscular Q6H PRN Jimmy Footman, MD      . magnesium hydroxide (MILK OF MAGNESIA) suspension 30 mL  30 mL Oral Daily PRN Audery Amel, MD      . metFORMIN (GLUCOPHAGE) tablet 500 mg  500 mg Oral BID WC Jimmy Footman, MD   500 mg at 08/31/15 0913  . metoprolol succinate (TOPROL-XL) 24 hr tablet 25 mg  25 mg Oral Daily Jimmy Footman, MD   25 mg at 08/31/15 0913  . nicotine (NICODERM CQ - dosed in mg/24 hours) patch 21 mg  21 mg Transdermal Daily Jolanta B Pucilowska, MD   21 mg at 08/31/15 0925  . pantoprazole (PROTONIX) EC tablet 40 mg  40 mg Oral Daily Audery Amel, MD   40 mg at 08/31/15 0907  . polyethylene glycol (MIRALAX / GLYCOLAX) packet 17 g  17 g Oral Daily Jimmy Footman, MD   17 g at 08/31/15 0907    Lab Results:  No results found for this or any previous visit (from the past 48 hour(s)).  Blood Alcohol level:  Lab Results  Component Value Date   [facility] <5 08/16/2015   ETH <5 09/19/2014    Metabolic Disorder Labs: Lab Results  Component Value Date   HGBA1C 6.5 (H) 08/18/2015   Lab Results  Component Value Date   PROLACTIN 50.7 (H) 08/18/2015   Lab Results  Component Value Date   CHOL 179 08/18/2015   TRIG 103 08/18/2015   HDL 36 (L) 08/18/2015   CHOLHDL 5.0 08/18/2015   VLDL 21 08/18/2015   LDLCALC 122 (H) 08/18/2015    Physical Findings: AIMS: Facial and Oral Movements Muscles of Facial Expression: None, normal Lips and  Perioral Area: None, normal Jaw: None, normal Tongue: None, normal,Extremity Movements Upper (arms, wrists, hands, fingers): None, normal Lower (legs, knees, ankles, toes): None, normal, Trunk Movements Neck, shoulders, hips: None, normal, Overall Severity Severity of abnormal movements (highest score from questions above): None, normal Incapacitation due to abnormal movements: None, normal Patient's awareness of abnormal movements (rate only patient's report): No Awareness, Dental Status Current problems with teeth and/or dentures?: No Does patient usually wear dentures?: No  CIWA:    COWS:     Musculoskeletal: Strength & Muscle Tone: within normal limits Gait & Station: normal Patient leans: N/A  Psychiatric Specialty Exam: Physical Exam  Nursing note and vitals reviewed. Constitutional: She is oriented to person, place, and time. She appears well-developed and well-nourished.  HENT:  Head: Normocephalic and atraumatic.  Eyes: Conjunctivae and EOM are normal.  Neck: Normal range of motion.  Respiratory: Effort normal.  Musculoskeletal: Normal range of motion.  Neurological: She is alert and oriented to person, place, and time.    Review of Systems  Unable to perform ROS: Acuity of condition    Blood pressure 109/78, pulse 96, temperature 98 F (36.7 C), temperature source Oral, resp. rate 18, height 5\' 9"  (1.753 m), weight 118.4 kg (261 lb), SpO2 99 %.Body mass index is 38.54 kg/m.  General Appearance: Fairly Groomed  Eye Contact:  Fair  Speech:  Pressured  Volume:  Increased  Mood:  Irritable  Affect:  Congruent  Thought Process:  Irrelevant and Descriptions of Associations: Loose  Orientation:  NA  Thought Content:  Illogical  Suicidal Thoughts:  N/a  Homicidal Thoughts:  N/A  Memory:  NA  Judgement:  Impaired  Insight:  Lacking  Psychomotor Activity:  Normal  Concentration:  Concentration: Poor and Attention Span: Poor  Recall:  Poor  Fund of Knowledge:   Poor  Language:  Poor  Akathisia:  No  Handed:    AIMS (if indicated):     Assets:  Financial Resources/Insurance Social Support  ADL's:  Intact  Cognition:  Impaired,  Moderate  Sleep:  Number of Hours: 8.75     Treatment Plan Summary:  No changes, pt is verbally aggressive, threatening staff, rude to staff and peers, impulsive and unpredictable. [facility]  Outburst are less frequent and less severe.  Orders given for blood work to be done last night the patient refuses. Looks like good patient eventually agreed to get the blood drawn today  Schizoaffective disorder bipolar type: Patient has been restarted on Clozaril. Her dose prior to admission was 350 mg.   Continue clozaril 350 mg   Plan to start depakote to target mood: continue Depakote 1500 mg daily at bedtime. Will need a level on Monday night.  Also will need ammonia level----refused labs last night.  Labs were taken today results are pending.  Sialorrhea: continue  ipatropium sublingual at bedtime  Agitation and aggression: will continue with only ativan prn  HTN: continue metoprolol 25 mg q am  Metabolic syndrome: continue metformin 500 mg po bid  GERD: continue protonix 40 mg  Gout: continue allopurinol  Constipation: continue miralax daily and  colace 200 mg po bid  Abscess: Per ER notes: exam does show evidence of new right inframammary superficial abscess that was drained on 8/14 while in the ER. Completed bactrim.  No visible per nursing.  Will d/c dressing changes.  RESOLVED  Urine incontinence: UA clear on 8/18.  Guardian reports this has been an chronic issue.  Continue desmopressin 1 spray on each nostril at night as clozaril is likely to worsen it by causing enuresis.  Checking BMP to r/o hyponatremia--Pending BMP results  Diet pt has been complaining about her diet---now on  regular  Precautions: q 15 m checks  Unsteady gait: continue using rolling walker, continue   fall precautions.  OK for pt to use wheelchair as needed   Hospital status: IVC  Labs: ordered Hba1c, lipid panel, prolactin, TSH.  Pending depakote,  ammonia and BMP   Disposition: Referral made to [facility] (state facility)  Follow-up: To be determined  EKG ordered on 8/22  Jimmy Footman, MD 08/31/2015, 2:13 PM

## 2015-08-31 NOTE — Plan of Care (Signed)
Problem: Skin Integrity: Goal: Risk for impaired skin integrity will decrease Outcome: Progressing Patient able to alert nurse for any issues regarding skin integrity, no breaks in skins

## 2015-08-31 NOTE — [facility] Group Notes (Signed)
[facility] Group Notes:  (Nursing/MHT/Case Management/Adjunct)  Date:  08/31/2015  Time:  1:40 PM  Type of Therapy:  Psychoeducational Skills  Participation Level:  Did Not Attend  Twanna Hymanda C Audreanna Torrisi 08/31/2015, 1:40 PM

## 2015-08-31 NOTE — Progress Notes (Signed)
Recreation Therapy Notes  Date: 08.29.17 Time: 9:30 am Location: Craft Room  Group Topic: Goal Setting  Goal Area(s) Addresses:  Patient will write at least one goal. Patient will write at least one obstacle.  Behavioral Response: Did not attend  Intervention: Recovery Goal Chart  Activity: Patients were instructed to make a Recovery Goal Chart including goals, obstacles, the date they started working on their goals, and the date they achieved their goals.  Education: LRT educated patients on ways they can celebrate reaching their goals in a healthy way.  Education Outcome: Patient did not attend group.   Clinical Observations/Feedback: Patient did not attend group.  Jacquelynn CreeGreene,Neymar Dowe M, LRT/CTRS 08/31/2015 10:06 AM

## 2015-08-31 NOTE — Progress Notes (Signed)
D:Patient  Refused to allow lab to  Draw this am shift .  Noted in bed naked . Patient  Got up for breakfast and went back to bed naked again. Patient  Remains  Loud and intrusive  . Patient had rolled out toliet tissue in toliet with  Urine No unit participation . Periods when she says she cant move , but  forgets what she say and has move on to dayroom or her room. A:.Stated appetite is good  Denies suicidal  homicidal ideations  .  No auditory hallucinations . Appropriate ADL'S. Interacting with peers and staff.  A: Encourage patient appropriate  Behavior  With unit programming . Instruction  Given on  Medication , verbalize understanding. R: Voice no other concerns. Staff continue to monitor

## 2015-08-31 NOTE — Plan of Care (Signed)
Problem: Pain Managment: Goal: General experience of comfort will improve Outcome: Progressing Patient has not complained of pain.   

## 2015-08-31 NOTE — Social Work (Signed)
CSW spoke with [facility] care coordinator, Staci Righterancy Batchlor @ [phone] who stated that she has contacted several group homes and MI facilities in [place] and [place]. Ms. Dixie DialsBatchlor stated that a number of facilities does not have beds available or do not accompany women. CSW and care coordinator, Cristela Blueancy Batclor are currently awaiting call back from [employer] Houst who is the Education officer, museumdirector of [facility]. It appears that [facility] has different facilities in the [place] which is what we are currently working on. CSW and care coordinator are awaiting return call from [facility].    Lynden OxfordKadijah R. Cowen Pesqueira, MSW, LCSW-A 08/31/2015, 3:53PM

## 2015-09-01 MED ORDER — METOPROLOL SUCCINATE ER 25 MG PO TB24: 25.0000 mg | ORAL_TABLET | Freq: Every day | ORAL | Status: AC

## 2015-09-01 MED ORDER — NICOTINE 21 MG/24HR TD PT24: 21.0000 mg | MEDICATED_PATCH | Freq: Every day | TRANSDERMAL | 0 refills | Status: AC

## 2015-09-01 MED ORDER — IPRATROPIUM BROMIDE 0.06 % NA SOLN: 1.0000 | Freq: Every day | NASAL | 12 refills | Status: AC

## 2015-09-01 MED ORDER — POLYETHYLENE GLYCOL 3350 17 G PO PACK: 17.0000 g | PACK | Freq: Every day | ORAL | 0 refills | Status: AC

## 2015-09-01 MED ORDER — DOCUSATE SODIUM 100 MG PO CAPS: 200.0000 mg | ORAL_CAPSULE | Freq: Two times a day (BID) | ORAL | 0 refills | Status: AC

## 2015-09-01 MED ORDER — DIVALPROEX SODIUM ER 500 MG PO TB24: 1500.0000 mg | ORAL_TABLET | Freq: Every day | ORAL | Status: AC

## 2015-09-01 MED ORDER — CLOZAPINE 50 MG PO TABS: 350.0000 mg | ORAL_TABLET | Freq: Every day | ORAL | Status: AC

## 2015-09-01 MED ORDER — PANTOPRAZOLE SODIUM 40 MG PO TBEC: 40.0000 mg | DELAYED_RELEASE_TABLET | Freq: Every day | ORAL | Status: AC

## 2015-09-01 MED ORDER — METFORMIN HCL 500 MG PO TABS: 500.0000 mg | ORAL_TABLET | Freq: Two times a day (BID) | ORAL | Status: AC

## 2015-09-01 MED ORDER — DESMOPRESSIN ACE SPRAY REFRIG 0.01 % NA SOLN: 2.0000 | Freq: Every day | NASAL | Status: AC

## 2015-09-01 NOTE — Progress Notes (Signed)
D:Patient aware of discharge this shift . [facility]. Patient no  belonging locked up . Patient denies  Suicidal  And homicidal ideations  .  A: Writer instructed on discharge criteria  .Unable to understand  Transitional Record and Suicide Risk Assessment .  Patient left unit with no questions  Or concerns  With sheriff

## 2015-09-01 NOTE — [facility] Group Notes (Signed)
[facility] Group Notes:  (Nursing/MHT/Case Management/Adjunct)  Date:  09/01/2015  Time:  4:42 AM  Type of Therapy:  Psychoeducational Skills  Participation Level:  Did Not Attend  Summary of Progress/Problems:  Amy Villarreal 09/01/2015, 4:42 AM

## 2015-09-01 NOTE — Tx Team (Signed)
Interdisciplinary Treatment Plan Update (Adult)         Date: 09/01/2015   Time Reviewed: 10:30 AM   Progress in Treatment: Improving Attending groups: Yes  Participating in groups: Yes  Taking medication as prescribed: Yes  Tolerating medication: Yes  Family/Significant other contact made: CSW spoke with legal guardian Patient understands diagnosis: Yes  Discussing patient identified problems/goals with staff: Yes  Medical problems stabilized or resolved: Yes  Denies suicidal/homicidal ideation: Yes  Issues/concerns per patient self-inventory: Yes  Other:   New problem(s) identified: N/A   Discharge Plan or Barriers: see below   Reason for Continuation of Hospitalization:   Depression   Anxiety   Medication Stabilization   Comments: N/A   Estimated length of stay: 3-5 days    Patient is a 50 year old female admitted for aggressive behaviors with a diagnosis of schizophrenia. Patient lives in [place], [place]. Patient will benefit from crisis stabilization, medication evaluation, group therapy, and psycho education in addition to case management for discharge planning. Patient and CSW reviewed pt's identified goals and treatment plan. Pt verbalized understanding and agreed to treatment plan.    Review of initial/current patient goals per problem list:  1. Goal(s): Patient will participate in aftercare plan   Met: No  Target date: 3-5 days post admission date   As evidenced by: Patient will participate within aftercare plan AEB aftercare provider and housing plan at discharge being identified.   CSW assessing proper aftercare plans.  2. Goal (s): Patient will exhibit decreased depressive symptoms and suicidal ideations.   Met: Goal progressing   Target date: 3-5 days post admission date   As evidenced by: Patient will utilize self-rating of depression at 3 or below and demonstrate decreased signs of depression or be deemed stable for discharge by MD.   Pt's  depression score of 4 at this time. Denies SI/HI at this time.  3. Goal(s): Patient will demonstrate decreased signs and symptoms of anxiety.   Met: No  Target date: 3-5 days post admission date   As evidenced by: Patient will utilize self-rating of anxiety at 3 or below and demonstrated decreased signs of anxiety, or be deemed stable for discharge by MD   Pt has an anxiety score of 4 at this time.   5. Goal(s): Patient will demonstrate decreased signs of psychosis  * Met: Goal progressing * Target date: 3-5 days post admission date  * As evidenced by: Patient will demonstrate decreased frequency of AVH or return to baseline function   Pt symptoms are improving.    Attendees:  Patient: Amy Villarreal  Family:  Physician: Merlyn Albert, MD    09/01/2015 10:30AM  Nursing: Polly Cobia, RN     09/01/2015 10:30AM  Clinical Social Worker: Glorious Peach, [place]  09/01/2015 10:30AM  Other: Jillyn Ledger, [place] Student     09/01/2015 10:30AM

## 2015-09-01 NOTE — Progress Notes (Signed)
  [facility]  Will you be returning to the same living situation after discharge:  No. [facility]. At discharge, do you have transportation home?: Yes,  [employer] department Do you have the ability to pay for your medications: Yes,  Medicare coverage   Release of information consent forms completed and in the chart;  Patient's signature needed at discharge.  Patient to Follow up at: Follow-up Information    [facility] Go on [facility]/01/2015.   Why[facility]  [facility]. Contact information: Address: [address], [place], [place] [address] Phone: [phone]          Next level of care provider [facility]:no  Safety Planning and Suicide Prevention discussed: Yes,  SPE reviewed with guardian  Have you used any form of tobacco in the last 30 days? (Cigarettes, Smokeless Tobacco, Cigars, and/or Pipes): Yes  Has patient been referred to the Quitline?: N/A patient is not a smoker  Patient has been referred for addiction treatment: N/A  Lynden OxfordKadijah R Deray Dawes, MSW, LCSW-A 09/01/2015, 2:52 PM

## 2015-09-01 NOTE — [facility] Suicide Risk Assessment (Signed)
[facility]   Principal Problem: Schizophrenia [facility](HCC) Discharge Diagnoses:  Patient Active Problem List   Diagnosis Date Noted  . Hypertension [I10] 08/17/2015  . Diabetes (HCC) [E11.9] 08/17/2015  . Gout [M10.9] 08/17/2015  . Schizophrenia (HCC) [F20.9] 06/26/2014    Total Time spent with patient: 45 minutes   Psychiatric Specialty Exam: ROS  Blood pressure 126/63, pulse 92, temperature 98 F (36.7 C), temperature source Oral, resp. rate 18, height 5\' 9"  (1.753 m), weight 118.4 kg (261 lb), SpO2 99 %.Body mass index is 38.54 kg/m.                                                       Mental Status Per Nursing Assessment::   On Admission:  NA  Demographic Factors:  Divorced or widowed and NA  Loss Factors: homeless  Historical Factors: Impulsivity  Risk Reduction Factors:   Positive social support  Continued Clinical Symptoms:  Currently Psychotic Previous Psychiatric Diagnoses and Treatments  Cognitive Features That Contribute To Risk:  Closed-mindedness    Suicide Risk:  Mild:  Suicidal ideation of limited frequency, intensity, duration, and specificity.  There are no identifiable plans, no associated intent, mild dysphoria and related symptoms, good self-control (both objective and subjective assessment), few other risk factors, and identifiable protective factors, including available and accessible social support.    [name],  [name], MD 09/01/2015, 1:44 PM

## 2015-09-01 NOTE — Discharge Summary (Signed)
Physician Discharge Summary Note  Patient:  Amy Villarreal is an 50 y.o., female MRN:  [id number] DOB:  03/04/1965 Patient phone:  [phone] (home)  Patient address:   [place] [address],  Total Time spent with patient: 45 minutes  Date of Admission:  08/17/2015 Date of Discharge: 09/01/15  Reason for Admission:  Psychosis and mania  Principal Problem: Schizophrenia [facility]) Discharge Diagnoses: Patient Active Problem List   Diagnosis Date Noted  . Hypertension [I10] 08/17/2015  . Diabetes ([place]) [E11.9] 08/17/2015  . Gout [M10.9] 08/17/2015  . Schizophrenia ([place]) [F20.9] 06/26/2014   History of Present Illness:   Amy R Burgessis a 50 y.o.femalehistory of schizoaffective disorder presented from group home as an IVC due to aggressive behavior and acute psychosis. While waiting for bed in the ER patient was aggressive and disruptive in the waiting room try to take her clothes off. Patient with flight of ideas in the ER and also was seeing interacting to internal stimuli. [employer] have Guardianship. Representative is Huel Coventry [phone]).  Per ER assessment: when asked what brought her to the ER, she start talking about a computers and how someone was asking her to leave. Then she started singing "Computer Love."And then stated, "You know Geanie Logan help write that song.  Per nurses last night: Pt speech is loud and tangential during assessment. She requires frequent redirection. 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 Affect is labile. She reports that she likes listening to music and is observed singing throughout the evening.     I attempted to interview the patient today however she isn't historian, she care repeating that her legs were bothering her and that she did not feel well. She did not answer any of my questions.  I contacted the patient's  legal guardian she tells me she was recently assigned to the case. The patient has had a guardian since 2010. Patient has a long history of property destruction, belligerent behavior, making threats of aggression and making sexual advances to women.  The patient has had multiple placements over the years due to her disruptive behaviors. She has no support from her family members. Due to the severity of her behaviors and mental illness the patient was on the streets for extended period of time she was addicted to drugs and was prostituting herself. The patient has also been incarcerated.  Per chart review: This patient was hospitalized for 40 days at [facility] from May 13 to June 22 of this year. Since that discharge the patient has been in 3 different group homes.  Patient is now homeless.  Guardian is requesting long-term psychiatric hospitalization as the patient has been on is stable for an extended period of time. She has been in and out of hospitals and has failed multiple placements.  This patient has been prescribed with Clozaril since 2014. She has had multiple trials with different medications such as in the area, Haldol Depakote. No known allergies at this time.  Per the discharge summary from [facility] patient was discharged on Clozaril 300 mg daily at bedtime and 50 mg in the morning.   Associated Signs/Symptoms: Depression Symptoms:  Unable to assess (Hypo) Manic Symptoms:  Impulsivity, Irritable Mood, Labiality of Mood, Anxiety Symptoms:  Unable to assess Psychotic Symptoms:  Unable to assess PTSD Symptoms: NA  Total Time spent with patient: 1  hour  Past Psychiatric History:  Patient has a long history of schizophrenia resistant to multiple medications. Last time I saw her she was taking clozapine and Risperdal together. Right now she is listed as only taking Haldol. I'm told that she had a recent hospitalization and has only been out of the hospital a short period  of time. It's not clear whether she's ever seriously tried to hurt herself in the past although she has been aggressive and destructive of property when psychotic.  Per her guardian there is been documented that the patient suffers from intellectual disability however they do not have any documentation to actually prove she has low IQ.   Past Medical History:  Past Medical History:  Diagnosis Date  . Anxiety   . Asthma   . Constipation   . DJD (degenerative joint disease)   . GERD (gastroesophageal reflux disease)   . Gout   . Hypertension   . Obesity   . Osteoporosis   . Schizoaffective disorder ([place])   . Urinary incontinence     Past Surgical History:  Procedure Laterality Date  . ABDOMINAL HYSTERECTOMY     Family History: History reviewed. No pertinent family history.   Family Psychiatric  History: not known  Tobacco Screening: Have you used any form of tobacco in the last 30 days? (Cigarettes, Smokeless Tobacco, Cigars, and/or Pipes): Yes Tobacco use, Select all that apply: 5 or more cigarettes per day Are you interested in Tobacco Cessation Medications?: Yes, will notify MD for an order Counseled patient on smoking cessation including recognizing danger situations, developing coping skills and basic information about quitting provided: Refused/Declined practical counseling    Social History: Patient I believe has a guardian or at least a care giver looking after her. She has had multiple hospitalizations and multiple group homes but has lost position several times because of aggression. History  Alcohol Use  . Yes    Comment: "sometimes"     History  Drug Use No    Social History   Social History  . Marital status: Single    Spouse name: N/A  . Number of children: N/A  . Years of education: N/A   Social History Main Topics  . Smoking status: Current Some Day Smoker    Types: Cigarettes  . Smokeless tobacco: Never Used  . Alcohol use Yes     Comment:  "sometimes"  . Drug use: No  . Sexual activity: No   Other Topics Concern  . None   Social History Narrative  . None    Hospital Course:   No changes, pt is verbally aggressive, threatening staff, rude to staff and peers, impulsive and unpredictable. Awaiting for admission at [facility].  Outburst are less frequent and less severe.   Schizoaffective disorder bipolar type: Patient has been restarted on Clozaril. Her dose prior to admission was 350 mg.   Continue clozaril 350 mg   Plan to start depakote to target mood: continue Depakote 1500 mg daily at bedtime. Depakote level on 8/29 was 76 (random level). Ammonia was 44.   Sialorrhea: continue  ipatropium sublingual at bedtime  Agitation and aggression: continue with only ativan prn  HTN: continue metoprolol 25 mg q am  Metabolic syndrome: continue metformin 500 mg po bid  GERD: continue protonix 40 mg  Gout: continue allopurinol bid  Constipation: continue miralax daily and  colace 200 mg po bid  Abscess: RESOLVED. Per ER notes: exam does show evidence of new right inframammary superficial abscess that  was drained on 8/14 while in the ER. Completed bactrim.  No visible per nursing.  Will d/c dressing changes.   Urine incontinence: UA clear on 8/18.  Guardian reports this has been an chronic issue.  Continue desmopressin 1 spray on each nostril at night as clozaril is likely to worsen it by causing enuresis.  Sodium yesterday was wnl  Diet pt has been complaining about her diet---now on  regular  Precautions: q 15 m checks  Unsteady gait: continue using rolling walker, continue  fall precautions.  OK for pt to use wheelchair as needed   Hospital status: IVC  Disposition: She will be discharged today from our unit and will be transferred to [facility].  Many attempts have been made to find a group home or assisted living for this patient however we were unable to locate any facilities that were  willing to [place] that the patient with her history.  EKG ordered on 8/22  Throughout her stay the patient was uncooperative with assessment. She will frequently asked me to "get the heck out of her room" or to get her kool-aid and potato chips. She was verbally aggressive to the nurses, she displayed sexually inappropriate behaviors towards females. She was loud, frequently yelling and having outbursts of anger.  Nurses describe her as intrusive, and very impulsive. Patient was unpredictable. Her outbursts however decreased in  frequency and intensity with Clozaril and Depakote.  Patient appears to be tolerating well this to medications.   During her stay she did not require seclusion, restraints or forced medications.  Physical Findings: AIMS: Facial and Oral Movements Muscles of Facial Expression: None, normal Lips and Perioral Area: None, normal Jaw: None, normal Tongue: None, normal,Extremity Movements Upper (arms, wrists, hands, fingers): None, normal Lower (legs, knees, ankles, toes): None, normal, Trunk Movements Neck, shoulders, hips: None, normal, Overall Severity Severity of abnormal movements (highest score from questions above): None, normal Incapacitation due to abnormal movements: None, normal Patient's awareness of abnormal movements (rate only patient's report): No Awareness, Dental Status Current problems with teeth and/or dentures?: No Does patient usually wear dentures?: No  CIWA:    COWS:     Musculoskeletal: Strength & Muscle Tone: within normal limits Gait & Station: normal Patient leans: N/A  Psychiatric Specialty Exam: Physical Exam  Constitutional: She is oriented to person, place, and time. She appears well-developed and well-nourished.  HENT:  Head: Normocephalic and atraumatic.  Eyes: EOM are normal.  Neck: Normal range of motion.  Respiratory: Effort normal.  Musculoskeletal: Normal range of motion.  Neurological: She is alert and oriented to  person, place, and time.    Review of Systems  Unable to perform ROS: Acuity of condition    Blood pressure 126/63, pulse 92, temperature 98 F (36.7 C), temperature source Oral, resp. rate 18, height 5' 9"  (1.753 m), weight 118.4 kg (261 lb), SpO2 99 %.Body mass index is 38.54 kg/m.  General Appearance: Fairly Groomed  Eye Contact:  Fair  Speech:  Pressured  Volume:  Increased  Mood:  Euthymic  Affect:  Constricted  Thought Process:  Irrelevant and Descriptions of Associations: Loose  Orientation:  Full (Time, Place, and Person)  Thought Content:  Hallucinations: None  Suicidal Thoughts:  No  Homicidal Thoughts:  No  Memory:  Immediate;   Poor Recent;   Poor Remote;   Poor  Judgement:  Poor  Insight:  Shallow  Psychomotor Activity:  Normal  Concentration:  Concentration: Poor and Attention Span: Poor  Recall:  Poor  Fund of Knowledge:  Poor  Language:  Poor  Akathisia:  No  Handed:    AIMS (if indicated):     Assets:  Financial Resources/Insurance Social Support  ADL's:  Intact  Cognition:  Impaired,  Moderate  Sleep:  Number of Hours: 8.75     Have you used any form of tobacco in the last 30 days? (Cigarettes, Smokeless Tobacco, Cigars, and/or Pipes): Yes  Has this patient used any form of tobacco in the last 30 days? (Cigarettes, Smokeless Tobacco, Cigars, and/or Pipes) Yes, Yes, A prescription for an FDA-approved tobacco cessation medication was offered at discharge and the patient refused  Blood Alcohol level:  Lab Results  Component Value Date   [facility] <5 08/16/2015   ETH <5 36/46/8032    Metabolic Disorder Labs:  Lab Results  Component Value Date   HGBA1C 6.5 (H) 08/18/2015   Lab Results  Component Value Date   PROLACTIN 50.7 (H) 08/18/2015   Lab Results  Component Value Date   CHOL 179 08/18/2015   TRIG 103 08/18/2015   HDL 36 (L) 08/18/2015   CHOLHDL 5.0 08/18/2015   VLDL 21 08/18/2015   LDLCALC 122 (H) 08/18/2015   Results for RHIANNAN, KIEVIT (MRN [id number]) as of 09/01/2015 13:57  Ref. Range 08/31/2015 14:18  Sodium Latest Ref Range: 135 - 145 mmol/L 136  Potassium Latest Ref Range: 3.5 - 5.1 mmol/L 4.2  Chloride Latest Ref Range: 101 - 111 mmol/L 100 (L)  CO2 Latest Ref Range: 22 - 32 mmol/L 27  BUN Latest Ref Range: 6 - 20 mg/dL 10  Creatinine Latest Ref Range: 0.44 - 1.00 mg/dL 0.57  Calcium Latest Ref Range: 8.9 - 10.3 mg/dL 9.4  EGFR (Non-African Amer.) Latest Ref Range: >60 mL/min >60  EGFR (African American) Latest Ref Range: >60 mL/min >60  Glucose Latest Ref Range: 65 - 99 mg/dL 112 (H)  Anion gap Latest Ref Range: 5 - 15  9  Ammonia Latest Ref Range: 9 - 35 umol/L 44 (H)  WBC Latest Ref Range: 3.6 - 11.0 K/uL 15.9 (H)  RBC Latest Ref Range: 3.80 - 5.20 MIL/uL 4.16  Hemoglobin Latest Ref Range: 12.0 - 16.0 g/dL 11.8 (L)  HCT Latest Ref Range: 35.0 - 47.0 % 35.7  MCV Latest Ref Range: 80.0 - 100.0 fL 85.9  MCH Latest Ref Range: 26.0 - 34.0 pg 28.3  MCHC Latest Ref Range: 32.0 - 36.0 g/dL 32.9  RDW Latest Ref Range: 11.5 - 14.5 % 14.7 (H)  Platelets Latest Ref Range: 150 - 440 K/uL 488 (H)  Neutrophils Latest Units: % 71  Lymphocytes Latest Units: % 21  Monocytes Relative Latest Units: % 5  Eosinophil Latest Units: % 2  Basophil Latest Units: % 1  NEUT# Latest Ref Range: 1.4 - 6.5 K/uL 11.4 (H)  Lymphocyte # Latest Ref Range: 1.0 - 3.6 K/uL 3.3  Monocyte # Latest Ref Range: 0.2 - 0.9 K/uL 0.7  Eosinophils Absolute Latest Ref Range: 0 - 0.7 K/uL 0.3  Basophils Absolute Latest Ref Range: 0 - 0.1 K/uL 0.2 (H)  Valproic Acid,S Latest Ref Range: 50.0 - 100.0 ug/mL 76   Results for PERPETUA, ELLING (MRN [id number]) as of 09/01/2015 13:57  Ref. Range 08/18/2015 11:39  TSH Latest Ref Range: 0.350 - 4.500 uIU/mL 1.578   Results for DORTHEA, MAINA (MRN [id number]) as of 09/01/2015 13:57  Ref. Range 08/16/2015 18:13  Acetaminophen (Tylenol), S Latest Ref Range: 10 - 30 ug/mL <10 (L)  Salicylate Lvl Latest Ref Range: 2.8  - 30.0 mg/dL <4.0   Results for MARSHELL, RIEGER (MRN [id number]) as of 09/01/2015 13:57  Ref. Range 08/20/2015 07:54  Appearance Latest Ref Range: CLEAR  CLEAR (A)  Bacteria, UA Latest Ref Range: NONE SEEN  NONE SEEN  Bilirubin Urine Latest Ref Range: NEGATIVE  NEGATIVE  Color, Urine Latest Ref Range: YELLOW  YELLOW (A)  Glucose Latest Ref Range: NEGATIVE mg/dL NEGATIVE  Hgb urine dipstick Latest Ref Range: NEGATIVE  NEGATIVE  Ketones, ur Latest Ref Range: NEGATIVE mg/dL NEGATIVE  Leukocytes, UA Latest Ref Range: NEGATIVE  NEGATIVE  Nitrite Latest Ref Range: NEGATIVE  NEGATIVE  pH Latest Ref Range: 5.0 - 8.0  5.0  Protein Latest Ref Range: NEGATIVE mg/dL NEGATIVE  RBC / HPF Latest Ref Range: 0 - 5 RBC/hpf 0-5  Specific Gravity, Urine Latest Ref Range: 1.005 - 1.030  1.018  Squamous Epithelial / LPF Latest Ref Range: NONE SEEN  0-5 (A)  WBC, UA Latest Ref Range: 0 - 5 WBC/hpf 0-5    Results for AMELLIA, PANIK (MRN [id number]) as of 09/01/2015 13:57  Ref. Range 08/16/2015 18:13  Alcohol, Ethyl (B) Latest Ref Range: <5 mg/dL <5  Amphetamines, Ur Screen Latest Ref Range: NONE DETECTED  NONE DETECTED  Barbiturates, Ur Screen Latest Ref Range: NONE DETECTED  NONE DETECTED  Benzodiazepine, Ur Scrn Latest Ref Range: NONE DETECTED  POSITIVE (A)  Cocaine Metabolite,Ur [place] Latest Ref Range: NONE DETECTED  NONE DETECTED  Methadone Scn, Ur Latest Ref Range: NONE DETECTED  NONE DETECTED  MDMA (Ecstasy)Ur Screen Latest Ref Range: NONE DETECTED  NONE DETECTED  Cannabinoid 50 Ng, Ur [place] Latest Ref Range: NONE DETECTED  NONE DETECTED  Opiate, Ur Screen Latest Ref Range: NONE DETECTED  NONE DETECTED  Phencyclidine (PCP) Ur S Latest Ref Range: NONE DETECTED  NONE DETECTED  Tricyclic, Ur Screen Latest Ref Range: NONE DETECTED  NONE DETECTED   ECG of 24-Aug-2015 10:31, (unconfirmed) No significant change was found Vent. rate 103 BPM PR interval 164 ms QRS duration 76 ms QT/QTc 348/455  See Psychiatric  Specialty Exam and Suicide Risk Assessment completed by Attending Physician prior to discharge.  [facility]  Is patient on multiple antipsychotic therapies at discharge:  No   Has Patient had three or more failed trials of antipsychotic monotherapy by history:  No  Recommended Plan for Multiple Antipsychotic Therapies: NA     Medication List    STOP taking these medications   haloperidol 5 MG tablet Commonly known as:  HALDOL   lisinopril 5 MG tablet Commonly known as:  PRINIVIL,ZESTRIL   LORazepam 0.5 MG tablet Commonly known as:  ATIVAN   senna-docusate 8.6-50 MG tablet Commonly known as:  Senokot-S     TAKE these medications     Indication  allopurinol 100 MG tablet Commonly known as:  ZYLOPRIM Take 50 mg by mouth 2 (two) times daily.  Indication:  gout   aspirin EC 81 MG tablet Take 81 mg by mouth daily.  Indication:  cardiovascular disease   clozapine 50 MG tablet Commonly known as:  CLOZARIL Take 7 tablets (350 mg total) by mouth at bedtime.  Indication:  Schizoaffective Disorder   desmopressin 0.01 % Soln Commonly known as:  DDAVP Place 2 sprays into the nose at bedtime.  Indication:  enuresis   divalproex 500 MG 24 hr tablet Commonly known as:  DEPAKOTE ER Take 3 tablets (1,500 mg total) by mouth at bedtime.  Indication:  schizoaffective   docusate sodium 100 MG capsule Commonly known as:  COLACE Take 2 capsules (200 mg total) by mouth 2 (two) times daily.  Indication:  Constipation   ipratropium 0.06 % nasal spray Commonly known as:  ATROVENT Place 1 spray into both nostrils at bedtime.  Indication:  sialorrhea   metFORMIN 500 MG tablet Commonly known as:  GLUCOPHAGE Take 1 tablet (500 mg total) by mouth 2 (two) times daily with a meal. What changed:  when to take this  Indication:  prevent metabolic syndrome   metoprolol succinate 25 MG 24 hr tablet Commonly known as:  TOPROL-XL Take 1 tablet (25 mg total)  by mouth daily.  Indication:  tachycardia   nicotine 21 mg/24hr patch Commonly known as:  NICODERM CQ - dosed in mg/24 hours Place 1 patch (21 mg total) onto the skin daily.  Indication:  Nicotine Addiction   pantoprazole 40 MG tablet Commonly known as:  PROTONIX Take 1 tablet (40 mg total) by mouth daily.  Indication:  GERD   polyethylene glycol packet Commonly known as:  MIRALAX / GLYCOLAX Take 17 g by mouth daily. What changed:  when to take this  reasons to take this  Indication:  Constipation        >30 minutes. >50 % of the time was spent in coordination of care.   Signed: Hildred Priest, MD 09/01/2015, 1:48 PM

## 2015-09-01 NOTE — Social Work (Addendum)
CSW spoke with Lorine BearsConnie Mullins [facility] @ [phone] who requested discharge paperwork for patient. CSW faxed over all needed documents. [facility] department aware and [facility].   Lynden OxfordKadijah R. Mirai Greenwood, MSW, LCSW-A 09/01/2015, 2:49PM

## 2015-09-01 NOTE — Plan of Care (Signed)
Problem: Activity: Goal: Will verbalize the importance of balancing activity with adequate rest periods Outcome: Progressing Patient able to let staff know when she needs to  rest

## 2015-09-01 NOTE — Progress Notes (Signed)
MEDICATION RELATED CONSULT NOTE -follow up  Pharmacy Consult for clozapine Indication: schizophrenia  No Known Allergies  Patient Measurements: Height: 5\' 9"  (175.3 cm) Weight: 261 lb (118.4 kg) IBW/kg (Calculated) : 66.2  Vital Signs: BP: 126/63 (08/30 0700) Pulse Rate: 92 (08/30 0700) Intake/Output from previous day: 08/29 0701 - 08/30 0700 In: 1200 [P.O.:1200] Out: -  Intake/Output from this shift: No intake/output data recorded.  Labs:  Recent Labs  08/31/15 1418  WBC 15.9*  HGB 11.8*  HCT 35.7  PLT 488*  CREATININE 0.57   Estimated Creatinine Clearance: 119.5 mL/min (by C-G formula based on SCr of 0.8 mg/dL).   Microbiology: Recent Results (from the past 720 hour(s))  Urine culture     Status: Abnormal   Collection Time: 08/20/15  7:54 AM  Result Value Ref Range Status   Specimen Description URINE, CLEAN CATCH  Final   Special Requests NONE  Final   Culture MULTIPLE SPECIES PRESENT, SUGGEST RECOLLECTION (A)  Final   Report Status 08/22/2015 FINAL  Final    Medical History: Past Medical History:  Diagnosis Date  . Anxiety   . Asthma   . Constipation   . DJD (degenerative joint disease)   . GERD (gastroesophageal reflux disease)   . Gout   . Hypertension   . Obesity   . Osteoporosis   . Schizoaffective disorder ([facility])   . Urinary incontinence     Medications:  Scheduled:  . allopurinol  50 mg Oral BID  . aspirin EC  81 mg Oral Daily  . cloZAPine  350 mg Oral QHS  . desmopressin  2 spray Nasal QHS  . divalproex  1,500 mg Oral QHS  . docusate sodium  200 mg Oral BID  . ipratropium  1 spray Each Nare QHS  . metFORMIN  500 mg Oral BID WC  . metoprolol succinate  25 mg Oral Daily  . nicotine  21 mg Transdermal Daily  . pantoprazole  40 mg Oral Daily  . polyethylene glycol  17 g Oral Daily    Assessment: 50 yo female with order for clozapine 350mg  PO QHS for schizophrenia.   08/16: ANC 10.7  08/23  ANC 8.5 8/30  ANC 11.4  Goal of  Therapy:  Continue therapy and monitor for side effects.   Plan:  Submitted current ANC to Clozapine Registry. Pt is eligible for dispensing. Weekly monitoring. Next labs are due Wed. 09/08/15.   Amy Villarreal A 09/01/2015,7:29 AM

## 2015-09-01 NOTE — Progress Notes (Signed)
D: Patient is still very intrusive. Very tangential. She did not attend group. She denies SI/HI/AVH. Patient is very impulsive.  A: Medication given with education. Encouragement provided.  R: Patient was compliant with medication. Safety maintained with 15 min checks.

## 2015-09-01 NOTE — Plan of Care (Signed)
Problem: Education: Goal: Emotional status will improve Outcome: Not Progressing Patient still remains agitated towards staff

## 2016-07-24 MED FILL — IMATINIB MESYLATE/400MG/TABS: IMATINIB MESYLATE/400MG/TABS | 30 days supply | Qty: 30 | Fill #6

## 2016-07-26 ENCOUNTER — Ambulatory Visit: Admission: RE | Admit: 2016-07-26 | Discharge: 2016-07-26 | Disposition: A | Discharge status: Home / Self Care

## 2016-07-26 ENCOUNTER — Ambulatory Visit
Admission: RE | Admit: 2016-07-26 | Discharge: 2016-07-26 | Disposition: A | Discharge status: Home / Self Care | Admitting: Emergency Medicine

## 2016-07-26 DIAGNOSIS — M25561 Pain in right knee: Principal | ICD-10-CM

## 2016-07-26 DIAGNOSIS — M25562 Pain in left knee: Secondary | ICD-10-CM

## 2016-07-26 DIAGNOSIS — M170 Bilateral primary osteoarthritis of knee: Secondary | ICD-10-CM

## 2016-08-17 MED ORDER — IMATINIB 400 MG TABLET
Freq: Every day | ORAL | 11 refills | 0.00000 days | Status: CP
Start: 2016-08-17 — End: 2017-09-04

## 2016-08-17 MED ORDER — IMATINIB 400 MG TABLET: 400 mg | each | 11 refills | 0 days

## 2016-08-17 MED FILL — IMATINIB MESYLATE/400MG/TABS: IMATINIB MESYLATE/400MG/TABS | 30 days supply | Qty: 30 | Fill #0

## 2016-09-18 MED FILL — IMATINIB MESYLATE/400MG/TABS: IMATINIB MESYLATE/400MG/TABS | 30 days supply | Qty: 30 | Fill #1

## 2016-09-21 ENCOUNTER — Ambulatory Visit
Admission: RE | Admit: 2016-09-21 | Discharge: 2016-09-21 | Disposition: A | Discharge status: Home / Self Care | Payer: MEDICARE | Attending: Nurse Practitioner | Admitting: Nurse Practitioner

## 2016-09-21 ENCOUNTER — Ambulatory Visit
Admission: RE | Admit: 2016-09-21 | Discharge: 2016-09-21 | Disposition: A | Discharge status: Home / Self Care | Payer: MEDICARE

## 2016-09-21 ENCOUNTER — Ambulatory Visit
Admission: RE | Admit: 2016-09-21 | Discharge: 2016-09-21 | Disposition: A | Discharge status: Home / Self Care | Payer: MEDICARE | Attending: Oncology | Admitting: Oncology

## 2016-09-21 DIAGNOSIS — C9210 Chronic myeloid leukemia, BCR/ABL-positive, not having achieved remission: Principal | ICD-10-CM

## 2016-10-18 MED FILL — IMATINIB MESYLATE/400MG/TABS: IMATINIB MESYLATE/400MG/TABS | 30 days supply | Qty: 30 | Fill #2

## 2016-11-01 ENCOUNTER — Ambulatory Visit: Admission: RE | Admit: 2016-11-01 | Discharge: 2016-11-01

## 2016-11-01 DIAGNOSIS — M170 Bilateral primary osteoarthritis of knee: Principal | ICD-10-CM

## 2016-11-15 MED FILL — IMATINIB MESYLATE/400MG/TABS: IMATINIB MESYLATE/400MG/TABS | 30 days supply | Qty: 30 | Fill #3

## 2016-12-12 MED FILL — IMATINIB MESYLATE/400MG/TABS: IMATINIB MESYLATE/400MG/TABS | 30 days supply | Qty: 30 | Fill #4

## 2016-12-21 ENCOUNTER — Ambulatory Visit
Admission: RE | Admit: 2016-12-21 | Discharge: 2016-12-21 | Disposition: A | Discharge status: Home / Self Care | Payer: MEDICARE | Attending: Hematology & Oncology | Admitting: Hematology & Oncology

## 2016-12-21 ENCOUNTER — Ambulatory Visit
Admission: RE | Admit: 2016-12-21 | Discharge: 2016-12-21 | Disposition: A | Discharge status: Home / Self Care | Payer: MEDICARE

## 2016-12-21 DIAGNOSIS — C9210 Chronic myeloid leukemia, BCR/ABL-positive, not having achieved remission: Principal | ICD-10-CM

## 2017-01-15 MED FILL — IMATINIB MESYLATE/400MG/TABS: IMATINIB MESYLATE/400MG/TABS | 30 days supply | Qty: 30 | Fill #5

## 2017-02-12 MED FILL — IMATINIB MESYLATE/400MG/TABS: IMATINIB MESYLATE/400MG/TABS | 30 days supply | Qty: 30 | Fill #6

## 2017-02-28 ENCOUNTER — Ambulatory Visit
Admit: 2017-02-28 | Discharge: 2017-03-01 | Payer: MEDICARE | Attending: Emergency Medicine | Primary: Emergency Medicine

## 2017-02-28 DIAGNOSIS — M170 Bilateral primary osteoarthritis of knee: Principal | ICD-10-CM

## 2017-03-22 ENCOUNTER — Other Ambulatory Visit: Admit: 2017-03-22 | Discharge: 2017-03-23 | Payer: MEDICARE

## 2017-03-22 ENCOUNTER — Ambulatory Visit: Admit: 2017-03-22 | Discharge: 2017-03-23 | Payer: MEDICARE

## 2017-03-22 DIAGNOSIS — C9210 Chronic myeloid leukemia, BCR/ABL-positive, not having achieved remission: Principal | ICD-10-CM

## 2017-03-22 MED FILL — IMATINIB MESYLATE/400MG/TABS: IMATINIB MESYLATE/400MG/TABS | 30 days supply | Qty: 30 | Fill #7

## 2017-04-24 MED FILL — IMATINIB MESYLATE/400MG/TABS: IMATINIB MESYLATE/400MG/TABS | 30 days supply | Qty: 30 | Fill #8

## 2017-05-21 MED FILL — IMATINIB MESYLATE/400MG/TABS: IMATINIB MESYLATE/400MG/TABS | 30 days supply | Qty: 30 | Fill #9

## 2017-05-29 ENCOUNTER — Ambulatory Visit
Admit: 2017-05-29 | Discharge: 2017-05-30 | Payer: MEDICARE | Attending: Emergency Medicine | Primary: Emergency Medicine

## 2017-05-29 DIAGNOSIS — M170 Bilateral primary osteoarthritis of knee: Principal | ICD-10-CM

## 2017-05-29 DIAGNOSIS — F250 Schizoaffective disorder, bipolar type: Secondary | ICD-10-CM

## 2017-06-20 MED FILL — IMATINIB MESYLATE/400MG/TABS: IMATINIB MESYLATE/400MG/TABS | 30 days supply | Qty: 30 | Fill #10

## 2017-06-21 ENCOUNTER — Other Ambulatory Visit: Admit: 2017-06-21 | Discharge: 2017-06-21 | Payer: MEDICARE

## 2017-06-21 ENCOUNTER — Ambulatory Visit: Admit: 2017-06-21 | Discharge: 2017-06-21 | Payer: MEDICARE

## 2017-06-21 DIAGNOSIS — C9210 Chronic myeloid leukemia, BCR/ABL-positive, not having achieved remission: Principal | ICD-10-CM

## 2017-06-21 MED ORDER — VITAMIN B COMPLEX TABLET
ORAL_TABLET | Freq: Every day | ORAL | 11 refills | 0.00000 days
Start: 2017-06-21 — End: 2018-06-21

## 2017-06-21 MED ORDER — ZINC SULFATE 220 MG TABLET
ORAL_TABLET | Freq: Every day | ORAL | 0 refills | 0.00000 days
Start: 2017-06-21 — End: ?

## 2017-06-21 MED ORDER — PRAVASTATIN 10 MG TABLET
ORAL_TABLET | Freq: Every day | ORAL | 3 refills | 0.00000 days | Status: SS
Start: 2017-06-21 — End: 2018-04-08

## 2017-06-21 MED ORDER — SENNOSIDES 8.6 MG TABLET
ORAL_TABLET | Freq: Two times a day (BID) | ORAL | 6 refills | 0.00000 days
Start: 2017-06-21 — End: 2017-07-21

## 2017-06-21 MED ORDER — LISINOPRIL 2.5 MG TABLET
ORAL_TABLET | Freq: Every day | ORAL | 11 refills | 0.00000 days
Start: 2017-06-21 — End: 2018-04-08

## 2017-09-04 MED ORDER — IMATINIB 400 MG TABLET
Freq: Every day | ORAL | 11 refills | 0.00000 days | Status: CP
Start: 2017-09-04 — End: 2017-09-07

## 2017-09-07 MED ORDER — IMATINIB 400 MG TABLET
Freq: Every day | ORAL | 11 refills | 30.00000 days | Status: CP
Start: 2017-09-07 — End: 2018-08-21
  Filled 2017-09-10: qty 30, 30d supply, fill #0

## 2017-09-07 NOTE — Unmapped (Signed)
Hi,     June with [facility] contacted the Communication Center requesting to speak with the care team of Kimberly Mathis to discuss:    The [facility] says they didn't receive the Rx for Gleevic    She would liek to help facilitate getting the Rx.    Please contact at [phone].    Thank you,   Vernie Ammons  [facility]   [phone]          [facility].

## 2017-09-08 NOTE — Unmapped (Signed)
[facility] Specialty Medication Referral: No PA required    Medication (Brand/Generic): imatinib 400mg     Initial Benefits Investigation Claim completed with resulted information below:  No PA required  Patient ABLE to fill at [facility]  Insurance Company:  [employer]  Anticipated Copay: $0    As Co-pay is under $25 defined limit, per policy there will be no further investigation of need for financial assistance at this time unless patient requests. This referral has been communicated to the provider and handed off to the [facility] team for further processing and filling of prescribed medication.   ______________________________________________________________________  Please utilize this referral for viewing purposes as it will serve as the central location for all relevant documentation and updates.

## 2017-09-10 MED FILL — IMATINIB 400 MG TABLET: 30 days supply | Qty: 30 | Fill #0 | Status: AC

## 2017-09-10 NOTE — Unmapped (Signed)
On Friday our nursing triage line received a call from Ms. Franson's home [facility]) concerned that the patient had not received her imatinib and she is out of medication, stating that the [facility] didn't have the prescription on file. Of note, Dr. Vertell Limber sent refills to her pharmacy last week.    On Friday 9/6, I called the [facility] pharmacy. The technician I spoke to stated that they had the medication prescription, but that she would have a very high copay with the insurance they have on file. Upon hearing this, I re-sent the prescription to [facility] for a benefits investigation. With the information we had on file, the copay would be $0.    Today 9/9, I spoke with staff (June) at [facility] to discuss the change of pharmacy and to confirm they were comfortable with this. From our discussion, the patient fills other medications through a long-term care pharmacy, and the staff member didn't mind the change of pharmacy for just imatinib to [facility].     Our [facility] pharmacy team will call the home and set up imatinib delivery today.     Please contact me with questions or concerns.      Manfred Arch, PharmD, CPP  Pager: [phone]

## 2017-09-10 NOTE — Unmapped (Signed)
[facility]   Patient Onboarding/Medication Counseling    [name] is a 52 y.o. female with CML who I am counseling today on initiation of therapy.    Medication: Imatinib 400mg     Verified patient's date of birth / HIPAA.      Education Provided: ?    Dose/Administration discussed: Take 1 tablet by mouth daily. Patient is not new therapy and declined this portion of counseling.    Storage requirements: this medicine should be stored at room temperature.     Side effects / precautions discussed: Patient is not new to therapy and declined this portion of counseling. Patient will receive a drug information handout with shipment.    Handling precautions / disposal reviewed:  Patient was counseled on the hazards surrounding oral chemotherapy and will minimize contact/exposure to the medicine.    Drug Interactions: other medications reviewed and up to date in Epic.  No drug interactions identified.    Comorbidities/Allergies: reviewed and up to date in Epic.    Verified therapy is appropriate and should continue      Delivery Information    Medication Assistance provided: none    Anticipated copay of $0 reviewed with patient. Verified delivery address in Epic.    Scheduled delivery date: 09/10/2017 via same day courier.    Explained that medication will be delivered by courier. and this shipment will not require a signature.      Explained the services we provide at [facility] and that each month we would call to set up refills.  Stressed importance of returning phone calls so that we could ensure they receive their medications in time each month.  Informed patient that we should be setting up refills 7-10 days prior to when they will run out of medication.  Informed patient that welcome packet will be sent.      Patient verbalized understanding of the above information as well as how to contact the pharmacy at [phone] option 4 with any questions/concerns.  The pharmacy is open Monday through Friday 8:30am-4:30pm.  A pharmacist is available 24/7 via pager to answer any clinical questions they may have.        Patient Specific Needs      ? Patient has no physical, cognitive, or cultural barriers.    ? Patient prefers to have medications discussed with  Caregiver     ? Patient is able to read and understand education materials at a high school level or above.    ? Patient's primary language is  English           Kimberly Mathis  Kimberly Mathis  [facility] Specialty Pharmacist

## 2017-09-27 ENCOUNTER — Other Ambulatory Visit: Admit: 2017-09-27 | Discharge: 2017-09-27 | Payer: MEDICARE

## 2017-09-27 ENCOUNTER — Ambulatory Visit
Admit: 2017-09-27 | Discharge: 2017-09-27 | Payer: MEDICARE | Attending: Hematology & Oncology | Primary: Hematology & Oncology

## 2017-09-27 ENCOUNTER — Ambulatory Visit: Admit: 2017-09-27 | Discharge: 2017-09-27 | Payer: MEDICARE

## 2017-09-27 DIAGNOSIS — C9210 Chronic myeloid leukemia, BCR/ABL-positive, not having achieved remission: Principal | ICD-10-CM

## 2017-09-27 LAB — CBC W/ AUTO DIFF
BASOPHILS ABSOLUTE COUNT: 0.1 10*9/L (ref 0.0–0.1)
EOSINOPHILS ABSOLUTE COUNT: 0.2 10*9/L (ref 0.0–0.4)
EOSINOPHILS RELATIVE PERCENT: 1.7 %
HEMATOCRIT: 33.3 % — ABNORMAL LOW (ref 36.0–46.0)
HEMOGLOBIN: 10.9 g/dL — ABNORMAL LOW (ref 12.0–16.0)
LARGE UNSTAINED CELLS: 2 % (ref 0–4)
LYMPHOCYTES ABSOLUTE COUNT: 5.6 10*9/L — ABNORMAL HIGH (ref 1.5–5.0)
MEAN CORPUSCULAR HEMOGLOBIN CONC: 32.7 g/dL (ref 31.0–37.0)
MEAN CORPUSCULAR HEMOGLOBIN: 31 pg (ref 26.0–34.0)
MEAN CORPUSCULAR VOLUME: 94.9 fL (ref 80.0–100.0)
MEAN PLATELET VOLUME: 7.7 fL (ref 7.0–10.0)
MONOCYTES ABSOLUTE COUNT: 0.7 10*9/L (ref 0.2–0.8)
MONOCYTES RELATIVE PERCENT: 5.6 %
NEUTROPHILS ABSOLUTE COUNT: 6.1 10*9/L (ref 2.0–7.5)
PLATELET COUNT: 381 10*9/L (ref 150–440)
RED BLOOD CELL COUNT: 3.51 10*12/L — ABNORMAL LOW (ref 4.00–5.20)
RED CELL DISTRIBUTION WIDTH: 13.9 % (ref 12.0–15.0)
WBC ADJUSTED: 12.9 10*9/L — ABNORMAL HIGH (ref 4.5–11.0)

## 2017-09-27 LAB — COMPREHENSIVE METABOLIC PANEL
ALBUMIN: 4.7 g/dL (ref 3.5–5.0)
ALKALINE PHOSPHATASE: 52 U/L (ref 38–126)
ALT (SGPT): 11 U/L — ABNORMAL LOW (ref 15–48)
ANION GAP: 13 mmol/L (ref 7–15)
AST (SGOT): 20 U/L (ref 14–38)
BILIRUBIN TOTAL: 0.2 mg/dL (ref 0.0–1.2)
BLOOD UREA NITROGEN: 7 mg/dL (ref 7–21)
CHLORIDE: 102 mmol/L (ref 98–107)
CO2: 25 mmol/L (ref 22.0–30.0)
CREATININE: 0.65 mg/dL (ref 0.60–1.00)
EGFR CKD-EPI AA FEMALE: >90 mL/min/{1.73_m2} (ref >=60)
EGFR CKD-EPI NON-AA FEMALE: >90 mL/min/{1.73_m2} (ref >=60)
GLUCOSE RANDOM: 130 mg/dL (ref 65–179)
POTASSIUM: 4 mmol/L (ref 3.5–5.0)
PROTEIN TOTAL: 8.1 g/dL (ref 6.5–8.3)
SODIUM: 140 mmol/L (ref 135–145)

## 2017-09-27 LAB — ANION GAP: Anion gap 3:SCnc:Pt:Ser/Plas:Qn:: 13

## 2017-09-27 LAB — BASOPHILS RELATIVE PERCENT: Lab: 0.5

## 2017-09-27 NOTE — Unmapped (Signed)
[name] Follow-up    Patient Name: Kimberly Mathis  Patient Age: 52 y.o.  Encounter Date: 09/27/2017    Primary Care Provider:  Sheliah Hatch    Referring Physician:  Sheliah Hatch  [address]  [address]  [place], [place] [address]    Reason for visit: F/U visit for [facility]    Assessment:  Ms. Tristy Udovich is a 52 y.o. female with history of severe schizoaffective disorder and cpCML found incidentally on blood work without significant hyperleukocytosis. She began treatment on imatinib in 12/2015 with optimal response and meeting appropriate treatment milestones, except her one year PCR, which was slightly above goal now in a MMR. She presents to clinic for a follow up appointment.      Ms. Traub is doing well and tolerating imatinib well without complications, side effects, or toxicity. She will continue on imatinib 400 mg daily indefinitely. She would be extremely high risk for treatment discontinuation given her institutionalization and potential for noncompliance. She will return to clinic every 3 months.     Plan and Recommendations:  BCR-ABL1 PCR pending  F/U in 3 months  Cont. Imatinib 400 mg daily    History of Present Illness:  Oncology History    Diagnosis: CML    SOKAL Score:    Presentation: asymptomatic; leukocytosis found on routine CBC    Presenting WBC Count: 17,400    Bone Marrow Biopsy: not performed as patient unable to tolerate procedure.    Cytogenetics:  Abnormal FISH:??An interphase FISH assay shows an abnormal signal pattern consistent with BCR/ABL1 fusion in 44% of the 100 cells scored.??Of note, these cells contain an atypical abnormal signal pattern consistent with BCR/ABL1 rearrangement and loss of the ABL1/ASS1 region.    Treatment:  Imatinib 400 mg daily- 12/31/2015    12/31/15:  BCR-ABL1 p210 transcripts were detected at a level of 9.511 IS% ratio in blood.  BCR-ABL1 p190 transcripts were detected at a level of 3 in 100,000 cells in blood. 03/23/16:  BCR-ABL1 p210 transcripts were detected at a level of 0.702 IS % ratio in blood.    Normal FISH:  A BCR/ABL1 interphase FISH assay for the previously documented 9;22 translocation shows a normal signal pattern in 100% of the 200 nuclei scored     06/15/16:  BCR-ABL1 p210 transcripts were detected at a level of 0.272 IS % ratio in blood.    09/2016:  0.298 IS %    12/18: 0.241 IS%    03/22/17: 0.072%    06/21/17: 0.052%        CML (chronic myelocytic leukemia) (CMS-HCC)    12/28/2015 Initial Diagnosis     CML (chronic myelocytic leukemia) (RAF-HCC)         Interval History:  Since last seen here, Ms. Abbett has no complaints although she is a poor historian and is very tangential which is her baseline due to her severe psychiatric D/O requiring institutionalization.     Otherwise, she denies new constitutional symptoms such as anorexia, weight loss, night sweats or unexplained fevers.  Furthermore, she denies symptoms of marrow failure: unexplained bleeding or bruising, recurrent or unexplained intercurrent infections, dyspnea on exertion, lightheadedness, palpitations or chest pain.  There have been no new or unexplained pains or self-identified masses, swelling or enlarged lymph nodes.    Past Medical, Surgical and Family History were reviewed and pertinent updates were made in the Electronic Medical Record    Review of Systems:  Other  than as reported above in the interim history, the balance of a full 12-system review was performed and unremarkable.    ECOG Performance Status: 1    Past Medical History:  Past Medical History:   Diagnosis Date   ??? GERD (gastroesophageal reflux disease)    ??? Gout    ??? HTN (hypertension)    ??? Morbid obesity (CMS-HCC)    ??? Osteoporosis    ??? Schizoaffective disorder, bipolar type (CMS-HCC)    ??? Type II diabetes mellitus (CMS-HCC)        Medications:  Current Outpatient Medications   Medication Sig Dispense Refill   ??? ACETAMINOPHEN ORAL Take 650 mg by mouth Three (3) times a day.     ??? allopurinol (ZYLOPRIM) 100 MG tablet Take 100 mg by mouth daily.     ??? B complex vitamins (B COMPLEX 1) tablet Take 1 tablet by mouth daily. 30 tablet 11   ??? Clozapine 150 mg TbDL 150 mg daily. Take 150 mg po QAM. Take 300 mg po Q1800.     ??? divalproex (DEPAKOTE) 500 MG DR tablet Take 500 mg by mouth daily at 10am. Pt takes 500mg  q am and Pt 750 mg @ 1800/     ??? docusate sodium (COLACE) 100 MG capsule Take 100 mg by mouth daily.     ??? folic acid (FOLVITE) 1 MG tablet Take 1 mg by mouth daily.     ??? glycopyrrolate (ROBINUL) 1 mg tablet Take 1 mg by mouth Two (2) times a day.     ??? imatinib (GLEEVEC) 400 MG tablet Take 1 tablet (400 mg total) by mouth daily. 30 each 11   ??? lisinopril (ZESTRIL) 2.5 MG tablet Take 1 tablet (2.5 mg total) by mouth daily. 30 tablet 11   ??? magnesium hydroxide (MILK OF MAGNESIUM CONCENTRATED) 2,400 mg/10 mL Susp Take 10 mL by mouth.     ??? metFORMIN (GLUCOPHAGE) 500 MG tablet Take 1,000 mg by mouth daily with breakfast.      ??? methyl salicylate liquid Apply 1 application topically as needed.     ??? metoprolol succinate (TOPROL-XL) 100 MG 24 hr tablet      ??? multivitamin (TAB-A-VITE/THERAGRAN) per tablet Take 1 tablet by mouth daily.     ??? OMEGA-3/DHA/EPA/FISH OIL (FISH OIL-OMEGA-3 FATTY ACIDS) 300-1,000 mg capsule Take 1 g by mouth daily.     ??? pravastatin (PRAVACHOL) 10 MG tablet Take 1 tablet (10 mg total) by mouth daily. 90 tablet 3   ??? pyridoxine, vitamin B6, (B-6) 100 MG tablet Take 200 mg by mouth daily.     ??? ranitidine (ZANTAC) 150 MG tablet Take 150 mg by mouth Two (2) times a day.     ??? zinc sulfate 220 mg Tab tablet Take 1 tablet (220 mg total) by mouth daily. 30 tablet 0     No current facility-administered medications for this visit.        Vital Signs:  BSA: 2.51 meters squared  Vitals:    09/27/17 1606   BP: 134/94   Pulse: 104   Resp: 18   SpO2: 100%         Physical Exam:  Objective:   Physical Exam: General: Resting in no apparent distress  HEENT:  Pupils are equal, round and reactive to light and accomodation.  There is no scleral icterus and no conjunctival injection.  The oral mucosa does not demonstrate ulceration, erythema, exudate or purpura.    Lymph node exam:  No lymphadenopathy in  the occipital, auricular, anterior/posterior cervical regions.  Heart:  Regular rate and rhythm.  Normal S1 and S2 without S3 or S4.  There are no murmurs, gallops or rubs.  Lungs:  Breathing is unlabored and patient is speaking full sentences with ease.  No stridor.  Auscultation of lung fields reveals normal air movement without rales, ronchi or crackles.    Abdomen:  No distention or pain on palpation. There is no palpable hepatomegaly or splenomegaly.  No palpable masses.  Skin:  No rashes, petechiae or purpura.  No areas of skin breakdown.  Musculoskeletal:  There are no grossly-evident joint effusions or deformities.  Range of motion about the shoulder, elbow, hips and knees is grossly normal.    Psychiatric:  Alert and oriented to person, place, time and situation.  Range of affect is appropriate.    Neurologic:   Strength 5/5 in upper and lower extremities. Gait is normal.   Extremities:  Appear well-perfused, there is no clubbing, edema or cyanosis.      Relevant Laboratory, radiology and pathology results:  Recent Results (from the past 24 hour(s))   Type and Screen    Collection Time: 09/27/17  3:31 PM   Result Value Ref Range    ABO Grouping O POS    Comprehensive Metabolic Panel    Collection Time: 09/27/17  3:31 PM   Result Value Ref Range    Sodium 140 135 - 145 mmol/L    Potassium 4.0 3.5 - 5.0 mmol/L    Chloride 102 98 - 107 mmol/L    CO2 25.0 22.0 - 30.0 mmol/L    BUN 7 7 - 21 mg/dL    Creatinine 1.61 0.96 - 1.00 mg/dL    BUN/Creatinine Ratio 11     EGFR CKD-EPI Non-African American, Female >90 >=60 mL/min/1.44m2    EGFR CKD-EPI African American, Female >90 >=60 mL/min/1.44m2    Glucose 130 65 - 179 mg/dL    Calcium 9.6 8.5 - 04.5 mg/dL    Albumin 4.7 3.5 - 5.0 g/dL Total Protein 8.1 6.5 - 8.3 g/dL    Total Bilirubin 0.2 0.0 - 1.2 mg/dL    AST 20 14 - 38 U/L    ALT 11 (L) 15 - 48 U/L    Alkaline Phosphatase 52 38 - 126 U/L    Anion Gap 13 7 - 15 mmol/L   BCR/ABL1 p210 Blood    Collection Time: 09/27/17  3:31 PM   Result Value Ref Range    Collection Collected    CBC w/ Differential    Collection Time: 09/27/17  3:31 PM   Result Value Ref Range    WBC 12.9 (H) 4.5 - 11.0 10*9/L    RBC 3.51 (L) 4.00 - 5.20 10*12/L    HGB 10.9 (L) 12.0 - 16.0 g/dL    HCT 40.9 (L) 81.1 - 46.0 %    MCV 94.9 80.0 - 100.0 fL    MCH 31.0 26.0 - 34.0 pg    MCHC 32.7 31.0 - 37.0 g/dL    RDW 91.4 78.2 - 95.6 %    MPV 7.7 7.0 - 10.0 fL    Platelet 381 150 - 440 10*9/L    Neutrophils % 47.1 %    Lymphocytes % 43.4 %    Monocytes % 5.6 %    Eosinophils % 1.7 %    Basophils % 0.5 %    Absolute Neutrophils 6.1 2.0 - 7.5 10*9/L    Absolute Lymphocytes 5.6 (H) 1.5 - 5.0 10*9/L  Absolute Monocytes 0.7 0.2 - 0.8 10*9/L    Absolute Eosinophils 0.2 0.0 - 0.4 10*9/L    Absolute Basophils 0.1 0.0 - 0.1 10*9/L    Large Unstained Cells 2 0 - 4 %                 Cindra Presume, MD

## 2017-09-27 NOTE — Unmapped (Signed)
It was great to see you today.    Your CML is under good control.     Please continue imatinib 400 mg daily.    We will see you back in clinic in 3 months.    If you have questions or concerns at night or on the weekend, call the hospital operator at [phone] and ask to speak to the hematology/oncology fellow on call.  ??  If you have questions or concerns on a week day during business hours, you may call the Nurse call line at [phone].    Lab on 09/27/2017   Component Date Value Ref Range Status   ??? Collection 09/27/2017 Collected   Final   ??? WBC 09/27/2017 12.9* 4.5 - 11.0 10*9/L Final   ??? RBC 09/27/2017 3.51* 4.00 - 5.20 10*12/L Final   ??? HGB 09/27/2017 10.9* 12.0 - 16.0 g/dL Final   ??? HCT 62/13/0865 33.3* 36.0 - 46.0 % Final   ??? MCV 09/27/2017 94.9  80.0 - 100.0 fL Final   ??? MCH 09/27/2017 31.0  26.0 - 34.0 pg Final   ??? MCHC 09/27/2017 32.7  31.0 - 37.0 g/dL Final   ??? RDW 78/46/9629 13.9  12.0 - 15.0 % Final   ??? MPV 09/27/2017 7.7  7.0 - 10.0 fL Final   ??? Platelet 09/27/2017 381  150 - 440 10*9/L Final   ??? Neutrophils % 09/27/2017 47.1  % Final   ??? Lymphocytes % 09/27/2017 43.4  % Final   ??? Monocytes % 09/27/2017 5.6  % Final   ??? Eosinophils % 09/27/2017 1.7  % Final   ??? Basophils % 09/27/2017 0.5  % Final   ??? Absolute Neutrophils 09/27/2017 6.1  2.0 - 7.5 10*9/L Final   ??? Absolute Lymphocytes 09/27/2017 5.6* 1.5 - 5.0 10*9/L Final   ??? Absolute Monocytes 09/27/2017 0.7  0.2 - 0.8 10*9/L Final   ??? Absolute Eosinophils 09/27/2017 0.2  0.0 - 0.4 10*9/L Final   ??? Absolute Basophils 09/27/2017 0.1  0.0 - 0.1 10*9/L Final   ??? Large Unstained Cells 09/27/2017 2  0 - 4 % Final

## 2017-09-27 NOTE — Unmapped (Signed)
Labs drawn and sent for analysis.  Care provided by  Y Cheek.

## 2017-10-02 NOTE — Unmapped (Signed)
[facility] Refill and Clinical Coordination Note  Medication(s): Imatinib 400mg     Kimberly Mathis, DOB: [birth year]  Phone: [phone] (home) , Alternate phone contact: N/A  Shipping address: [facility]  [address]  [place] [address]  Phone or address changes today?: No  All above HIPAA information verified.  Insurance changes? No    Completed refill and clinical call assessment today to schedule patient's medication shipment from the [facility] [phone]).      MEDICATION RECONCILIATION    Confirmed the medication and dosage are correct and have not changed: Yes, regimen is correct and unchanged.    Were there any changes to your medication(s) in the past month:  No, there are no changes reported at this time.    ADHERENCE    Is this medicine transplant or covered by Medicare Part B? No.    Not Applicable    Did you miss any doses in the past 4 weeks? No missed doses reported.  Adherence counseling provided? Not needed     SIDE EFFECT MANAGEMENT    Are you tolerating your medication?:  Kimberly Mathis reports tolerating the medication.  Side effect management discussed: None      Therapy is appropriate and should be continued.    Evidence of clinical benefit: See Epic note from 09/27/2017      FINANCIAL/SHIPPING    Delivery Scheduled: Yes, Expected medication delivery date: 10/05/2017     Additional medications refilled: No additional medications/refills needed at this time.    The patient will receive a drug information handout for each medication shipped and additional FDA Medication Guides as required.      Kimberly Mathis did not have any additional questions at this time.    Delivery address confirmed in Epic.     We will follow up with patient monthly for standard refill processing and delivery.      Thank you,  Kimberly Mathis  Anders Grant   [facility] Specialty Pharmacist

## 2017-10-04 MED FILL — IMATINIB 400 MG TABLET: ORAL | 30 days supply | Qty: 30 | Fill #1

## 2017-10-04 MED FILL — IMATINIB 400 MG TABLET: 30 days supply | Qty: 30 | Fill #1 | Status: AC

## 2017-11-01 NOTE — Unmapped (Signed)
[facility] Refill Coordination Note    Specialty Medication(s) to be Shipped:   Hematology/Oncology: Gleevec       Kimberly Mathis, DOB: 1965/09/01  Phone: [phone] (home)       All above HIPAA information was verified with patient.     Completed refill call assessment today to schedule patient's medication shipment from the [facility] [phone]).       Specialty medication(s) and dose(s) confirmed: Regimen is correct and unchanged.   Changes to medications: Kimberly Mathis reports no changes reported at this time.  Changes to insurance: No  Questions for the pharmacist: No    The patient will receive a drug information handout for each medication shipped and additional FDA Medication Guides as required.      DISEASE/MEDICATION-SPECIFIC INFORMATION        N/A    ADHERENCE     Medication Adherence    Patient reported X missed doses in the last month:  0  Specialty Medication:  imatinib (GLEEVEC) 400 MG   Patient is on additional specialty medications:  No  Patient is on more than two specialty medications:  No  Any gaps in refill history greater than 2 weeks in the last 3 months:  no  Demonstrates understanding of importance of adherence:  yes  Informant:  caregiver  Reliability of informant:  reliable  Provider-estimated medication adherence level:  good              Confirmed plan for next specialty medication refill:  delivery by pharmacy          Refill Coordination    Is the Shipping Address Different:  No         MEDICARE PART B DOCUMENTATION     imatinib (GLEEVEC) 400 MG : Patient has three days worth on hand.    SHIPPING     Shipping address confirmed in Epic.     Delivery Scheduled: Yes, Expected medication delivery date: 11/07/2017 via UPS or courier.     Medication will be delivered via UPS to the home address in Epic [place].    [name]   [facility] Specialty Technician

## 2017-11-06 MED FILL — IMATINIB 400 MG TABLET: ORAL | 30 days supply | Qty: 30 | Fill #2

## 2017-11-06 MED FILL — IMATINIB 400 MG TABLET: 30 days supply | Qty: 30 | Fill #2 | Status: AC

## 2017-11-23 NOTE — Unmapped (Signed)
[facility] Refill Coordination Note    Specialty Medication(s) to be Shipped:   Hematology/Oncology: Imatinib       Kimberly Mathis, DOB: 1965/08/15  Phone: [phone] (home)       All above HIPAA information was verified with patient's caregiver.     Completed refill call assessment today to schedule patient's medication shipment from the [facility] [phone]).       Specialty medication(s) and dose(s) confirmed: Regimen is correct and unchanged.   Changes to medications: Kimberly Mathis reports no changes reported at this time.  Changes to insurance: No  Questions for the pharmacist: No    The patient will receive a drug information handout for each medication shipped and additional FDA Medication Guides as required.      DISEASE/MEDICATION-SPECIFIC INFORMATION        N/A    ADHERENCE     Medication Adherence    Patient reported X missed doses in the last month:  0  Specialty Medication:  imatinib 400 MG   Patient is on additional specialty medications:  No  Patient is on more than two specialty medications:  No  Any gaps in refill history greater than 2 weeks in the last 3 months:  no  Demonstrates understanding of importance of adherence:  yes  Informant:  caregiver  Reliability of informant:  reliable  Provider-estimated medication adherence level:  good              Confirmed plan for next specialty medication refill:  delivery by pharmacy          Refill Coordination    Has the Patients' Contact Information Changed:  No  Is the Shipping Address Different:  No         MEDICARE PART B DOCUMENTATION     imatinib 400 MG : Patient has 6 days worth on hand.    SHIPPING     Shipping address confirmed in Epic.     Delivery Scheduled: Yes, Expected medication delivery date: 11/27/2017 via UPS or courier.     Medication will be delivered via Next Day Courier to the home address in Epic [place].    [name]   [facility] Specialty Technician

## 2017-11-27 NOTE — Unmapped (Addendum)
Kimberly Mathis 's Imatinib shipment will be delayed due to Refill too soon until 11/29  We have contacted the patient and left a message We will reschedule the medication for the delivery date that the patient agreed upon. We have confirmed the delivery date as 12/03/17 via same day courier

## 2017-12-03 MED FILL — IMATINIB 400 MG TABLET: 30 days supply | Qty: 30 | Fill #3 | Status: AC

## 2017-12-03 MED FILL — IMATINIB 400 MG TABLET: ORAL | 30 days supply | Qty: 30 | Fill #3

## 2017-12-20 NOTE — Unmapped (Signed)
[facility] Refill Coordination Note    Specialty Medication(s) to be Shipped:   Hematology/Oncology: Imatinib    Other medication(s) to be shipped: Kimberly Mathis, DOB: [birth year]  Phone: [phone] (home)       All above HIPAA information was verified with patient's caregiver.     Completed refill call assessment today to schedule patient's medication shipment from the [facility] [phone]).       Specialty medication(s) and dose(s) confirmed: Regimen is correct and unchanged.   Changes to medications: Kimberly Mathis reports no changes reported at this time.  Changes to insurance: No  Questions for the pharmacist: No    The patient will receive a drug information handout for each medication shipped and additional FDA Medication Guides as required.      DISEASE/MEDICATION-SPECIFIC INFORMATION        N/A    ADHERENCE              MEDICARE PART B DOCUMENTATION     Not Applicable    SHIPPING     Shipping address confirmed in Epic.     Delivery Scheduled: Yes, Expected medication delivery date: 12/28/17 via UPS or courier.     Medication will be delivered via Next Day Courier to the home address in Epic WAM.    Arnold Long   [facility] Specialty Pharmacist

## 2017-12-27 MED FILL — IMATINIB 400 MG TABLET: 30 days supply | Qty: 30 | Fill #4 | Status: AC

## 2017-12-27 MED FILL — IMATINIB 400 MG TABLET: ORAL | 30 days supply | Qty: 30 | Fill #4

## 2018-01-01 NOTE — Unmapped (Signed)
[facility] Follow-up    Patient Name: Kimberly Mathis  Patient Age: 52 y.o.  Encounter Date: 01/03/2018    Primary Care Provider:  Marcy Siren, PA    Referring Physician:  Desiree Lucy, PA  [address]  [address]  [place], [place] [address]    Reason for visit:   F/u visit for [facility]    Assessment:  Kimberly Mathis is a 51 y.o. female with history of severe schizoaffective disorder and cpCML found incidentally on blood work without significant hyperleukocytosis. She began treatment on imatinib in 12/2015 with optimal response and meeting appropriate treatment milestones, except her one year PCR, which was slightly above goal now in a MMR. She presents to clinic for a follow up appointment.      Kimberly Mathis is doing well and tolerating imatinib well without complications, side effects, or toxicity. She will continue on imatinib 400 mg daily indefinitely. She would be extremely high risk for treatment discontinuation given her institutionalization (now at group home) and potential for noncompliance. She will return to clinic every 3 months.     Plan and Recommendations:  1. Cont. Imatinib 400 mg daily  2. F/u BCR-ABL1 PCR - should her PCR continue to rise, may need to schedule sooner appointment to discuss possible change in TKI  3. RTC in 3 months  4. May try topical Aspercreme to knees     Dr. Vertell Limber was available.    Mariana Kaufman, AGPCNP-BC  Nurse Practitioner  Hematology/Oncology  [facility]  01/03/2018      History of Present Illness:  Oncology History    Diagnosis: CML    SOKAL Score:    Presentation: asymptomatic; leukocytosis found on routine CBC    Presenting WBC Count: 17,400    Bone Marrow Biopsy: not performed as patient unable to tolerate procedure.    Cytogenetics:  Abnormal FISH:??An interphase FISH assay shows an abnormal signal pattern consistent with BCR/ABL1 fusion in 44% of the 100 cells scored.??Of note, these cells contain an atypical abnormal signal pattern consistent with BCR/ABL1 rearrangement and loss of the ABL1/ASS1 region.    Treatment:  Imatinib 400 mg daily- 12/31/2015    12/31/15:  BCR-ABL1 p210 transcripts were detected at a level of 9.511 IS% ratio in blood.  BCR-ABL1 p190 transcripts were detected at a level of 3 in 100,000 cells in blood.    03/23/16:  BCR-ABL1 p210 transcripts were detected at a level of 0.702 IS % ratio in blood.    Normal FISH:  A BCR/ABL1 interphase FISH assay for the previously documented 9;22 translocation shows a normal signal pattern in 100% of the 200 nuclei scored     06/15/16:  BCR-ABL1 p210 transcripts were detected at a level of 0.272 IS % ratio in blood.    09/2016:  0.298 IS %    12/18: 0.241 IS%    03/22/17: 0.072%    06/21/17: 0.052%    09/27/17: 0.125%    01/03/18: pending        CML (chronic myelocytic leukemia) (CMS-HCC)    12/28/2015 Initial Diagnosis     CML (chronic myelocytic leukemia) (RAF-HCC)       Interval History:  Since last seen here, Kimberly Mathis has been living in the group home for approximately six months. [name] is a poor historian and tangential (baseline d/t severe psychiatric D/O). Her main complaint today has to do with pain in her knees. She takes extra strength tylenol twice daily and uses  Icy Hot twice daily. Also has occasional reflux and nausea.    Otherwise, she denies new constitutional symptoms such as anorexia, weight loss, night sweats or unexplained fevers.  Furthermore, she denies symptoms of marrow failure: unexplained bleeding or bruising, recurrent or unexplained intercurrent infections, dyspnea on exertion, lightheadedness, palpitations or chest pain.  There have been no new or unexplained pains or self-identified masses, swelling or enlarged lymph nodes.    Past Medical, Surgical and Family History were reviewed and pertinent updates were made in the Electronic Medical Record    Review of Systems:  Other than as reported above in the interim history, the other systems reviewed were unremarkable.    ECOG Performance Status: 1    Past Medical History:  Past Medical History:   Diagnosis Date   ??? GERD (gastroesophageal reflux disease)    ??? Gout    ??? HTN (hypertension)    ??? Morbid obesity (CMS-HCC)    ??? Osteoporosis    ??? Schizoaffective disorder, bipolar type (CMS-HCC)    ??? Type II diabetes mellitus (CMS-HCC)        Medications:    Current Outpatient Medications   Medication Sig Dispense Refill   ??? ACETAMINOPHEN ORAL Take 650 mg by mouth Three (3) times a day.     ??? allopurinol (ZYLOPRIM) 100 MG tablet Take 100 mg by mouth daily.     ??? B complex vitamins (B COMPLEX 1) tablet Take 1 tablet by mouth daily. 30 tablet 11   ??? Clozapine 150 mg TbDL 150 mg daily. Take 150 mg po QAM. Take 300 mg po Q1800.     ??? divalproex (DEPAKOTE) 500 MG DR tablet Take 500 mg by mouth daily at 10am. Pt takes 500mg  q am and Pt 750 mg @ 1800/     ??? docusate sodium (COLACE) 100 MG capsule Take 100 mg by mouth daily.     ??? folic acid (FOLVITE) 1 MG tablet Take 1 mg by mouth daily.     ??? glycopyrrolate (ROBINUL) 1 mg tablet Take 1 mg by mouth Two (2) times a day.     ??? imatinib (GLEEVEC) 400 MG tablet Take 1 tablet (400 mg total) by mouth daily. 30 each 11   ??? lisinopril (ZESTRIL) 2.5 MG tablet Take 1 tablet (2.5 mg total) by mouth daily. 30 tablet 11   ??? magnesium hydroxide (MILK OF MAGNESIUM CONCENTRATED) 2,400 mg/10 mL Susp Take 10 mL by mouth.     ??? metFORMIN (GLUCOPHAGE) 500 MG tablet Take 1,000 mg by mouth daily with breakfast.      ??? methyl salicylate liquid Apply 1 application topically as needed.     ??? metoprolol succinate (TOPROL-XL) 100 MG 24 hr tablet      ??? multivitamin (TAB-A-VITE/THERAGRAN) per tablet Take 1 tablet by mouth daily.     ??? OMEGA-3/DHA/EPA/FISH OIL (FISH OIL-OMEGA-3 FATTY ACIDS) 300-1,000 mg capsule Take 1 g by mouth daily.     ??? pravastatin (PRAVACHOL) 10 MG tablet Take 1 tablet (10 mg total) by mouth daily. 90 tablet 3   ??? pyridoxine, vitamin B6, (B-6) 100 MG tablet Take 200 mg by mouth daily. ??? ranitidine (ZANTAC) 150 MG tablet Take 150 mg by mouth Two (2) times a day.     ??? zinc sulfate 220 mg Tab tablet Take 1 tablet (220 mg total) by mouth daily. 30 tablet 0     No current facility-administered medications for this visit.      Vital Signs:  BSA: 2.55 meters  squared  Vitals:    01/03/18 1229   BP: 116/83   Pulse: 97   Temp: 36.7 ??C (98.1 ??F)     Physical Exam:  Constitutional: Resting, in no apparent distress  Eyes: PERRL. No scleral icterus or conjunctival injection.  Ear/nose/mouth/throat: Oral mucosa without ulceration, erythema or exudate.   Hematology/lymphatic/immunologic:  Not examined.  Cardiovascular:  RRR.  S1, S2.  No murmurs, gallops or rubs. Appear well-perfused.  No clubbing, edema or cyanosis.  Respiratory:  Breathing is unlabored, and patient is speaking full sentences with ease.  No stridor.  CTAB. No rales, ronchi or crackles.    GI:  No distention or pain on palpation.  Bowel sounds are present and normal in quality.  No palpable hepatomegaly or splenomegaly.  No palpable masses.  GU: not examined  Musculoskeletal:  No grossly-evident joint effusions or deformities.  Range of motion about the shoulder, elbow, hips and knees is grossly normal.  Skin:  No rashes, petechiae or purpura.  No areas of skin breakdown. Warm to touch, dry, smooth and even.  Neurologic:  Uses walker to walk, wide based gait. Cerebellar tasks are completed with ease and are symmetric.  Psychiatric:  Alert and oriented to person, place, time and situation.  Range of affect is appropriate.        Relevant Laboratory, radiology and pathology results:  Lab on 01/03/2018   Component Date Value Ref Range Status   ??? Sodium 01/03/2018 141  135 - 145 mmol/L Final   ??? Potassium 01/03/2018 4.2  3.5 - 5.0 mmol/L Final   ??? Chloride 01/03/2018 102  98 - 107 mmol/L Final   ??? CO2 01/03/2018 28.0  22.0 - 30.0 mmol/L Final   ??? BUN 01/03/2018 7  7 - 21 mg/dL Final   ??? Creatinine 01/03/2018 0.53* 0.60 - 1.00 mg/dL Final   ??? BUN/Creatinine Ratio 01/03/2018 13   Final   ??? EGFR CKD-EPI Non-African American,* 01/03/2018 >90  >=60 mL/min/1.37m2 Final   ??? EGFR CKD-EPI African American, Fem* 01/03/2018 >90  >=60 mL/min/1.68m2 Final   ??? Glucose 01/03/2018 115  70 - 179 mg/dL Final   ??? Calcium 66/44/0347 9.4  8.5 - 10.2 mg/dL Final   ??? Albumin 42/59/5638 4.6  3.5 - 5.0 g/dL Final   ??? Total Protein 01/03/2018 8.2  6.5 - 8.3 g/dL Final   ??? Total Bilirubin 01/03/2018 0.2  0.0 - 1.2 mg/dL Final   ??? AST 75/64/3329 21  14 - 38 U/L Final   ??? ALT 01/03/2018 15  <35 U/L Final   ??? Alkaline Phosphatase 01/03/2018 56  38 - 126 U/L Final   ??? Anion Gap 01/03/2018 11  7 - 15 mmol/L Final   ??? Collection 01/03/2018 Collected   Final   ??? WBC 01/03/2018 10.3  4.5 - 11.0 10*9/L Final   ??? RBC 01/03/2018 3.61* 4.00 - 5.20 10*12/L Final   ??? HGB 01/03/2018 10.9* 12.0 - 16.0 g/dL Final   ??? HCT 51/88/4166 34.1* 36.0 - 46.0 % Final   ??? MCV 01/03/2018 94.5  80.0 - 100.0 fL Final   ??? MCH 01/03/2018 30.2  26.0 - 34.0 pg Final   ??? MCHC 01/03/2018 31.9  31.0 - 37.0 g/dL Final   ??? RDW 07/02/1599 14.6  12.0 - 15.0 % Final   ??? MPV 01/03/2018 8.1  7.0 - 10.0 fL Final   ??? Platelet 01/03/2018 334  150 - 440 10*9/L Final   ??? Neutrophils % 01/03/2018 61.4  % Final   ???  Lymphocytes % 01/03/2018 30.9  % Final   ??? Monocytes % 01/03/2018 5.2  % Final   ??? Eosinophils % 01/03/2018 1.3  % Final   ??? Basophils % 01/03/2018 0.4  % Final   ??? Absolute Neutrophils 01/03/2018 6.3  2.0 - 7.5 10*9/L Final   ??? Absolute Lymphocytes 01/03/2018 3.2  1.5 - 5.0 10*9/L Final   ??? Absolute Monocytes 01/03/2018 0.5  0.2 - 0.8 10*9/L Final   ??? Absolute Eosinophils 01/03/2018 0.1  0.0 - 0.4 10*9/L Final   ??? Absolute Basophils 01/03/2018 0.0  0.0 - 0.1 10*9/L Final   ??? Large Unstained Cells 01/03/2018 1  0 - 4 % Final   ??? Macrocytosis 01/03/2018 Slight* Not Present Final   ??? Hypochromasia 01/03/2018 Slight* Not Present Final

## 2018-01-03 ENCOUNTER — Other Ambulatory Visit: Admit: 2018-01-03 | Discharge: 2018-01-04 | Payer: MEDICARE

## 2018-01-03 ENCOUNTER — Ambulatory Visit: Admit: 2018-01-03 | Discharge: 2018-01-04 | Payer: MEDICARE

## 2018-01-03 DIAGNOSIS — C9210 Chronic myeloid leukemia, BCR/ABL-positive, not having achieved remission: Principal | ICD-10-CM

## 2018-01-03 LAB — COMPREHENSIVE METABOLIC PANEL
ALBUMIN: 4.6 g/dL (ref 3.5–5.0)
ALKALINE PHOSPHATASE: 56 U/L (ref 38–126)
ALT (SGPT): 15 U/L (ref <35)
ANION GAP: 11 mmol/L (ref 7–15)
AST (SGOT): 21 U/L (ref 14–38)
BILIRUBIN TOTAL: 0.2 mg/dL (ref 0.0–1.2)
BLOOD UREA NITROGEN: 7 mg/dL (ref 7–21)
CALCIUM: 9.4 mg/dL (ref 8.5–10.2)
CHLORIDE: 102 mmol/L (ref 98–107)
CO2: 28 mmol/L (ref 22.0–30.0)
CREATININE: 0.53 mg/dL — ABNORMAL LOW (ref 0.60–1.00)
EGFR CKD-EPI AA FEMALE: >90 mL/min/{1.73_m2} (ref >=60)
EGFR CKD-EPI NON-AA FEMALE: >90 mL/min/{1.73_m2} (ref >=60)
GLUCOSE RANDOM: 115 mg/dL (ref 70–179)
POTASSIUM: 4.2 mmol/L (ref 3.5–5.0)
PROTEIN TOTAL: 8.2 g/dL (ref 6.5–8.3)
SODIUM: 141 mmol/L (ref 135–145)

## 2018-01-03 LAB — CBC W/ AUTO DIFF
BASOPHILS RELATIVE PERCENT: 0.4 %
EOSINOPHILS ABSOLUTE COUNT: 0.1 10*9/L (ref 0.0–0.4)
EOSINOPHILS RELATIVE PERCENT: 1.3 %
HEMATOCRIT: 34.1 % — ABNORMAL LOW (ref 36.0–46.0)
HEMOGLOBIN: 10.9 g/dL — ABNORMAL LOW (ref 12.0–16.0)
LARGE UNSTAINED CELLS: 1 % (ref 0–4)
LYMPHOCYTES ABSOLUTE COUNT: 3.2 10*9/L (ref 1.5–5.0)
LYMPHOCYTES RELATIVE PERCENT: 30.9 %
MEAN CORPUSCULAR HEMOGLOBIN CONC: 31.9 g/dL (ref 31.0–37.0)
MEAN CORPUSCULAR HEMOGLOBIN: 30.2 pg (ref 26.0–34.0)
MEAN CORPUSCULAR VOLUME: 94.5 fL (ref 80.0–100.0)
MEAN PLATELET VOLUME: 8.1 fL (ref 7.0–10.0)
MONOCYTES ABSOLUTE COUNT: 0.5 10*9/L (ref 0.2–0.8)
MONOCYTES RELATIVE PERCENT: 5.2 %
NEUTROPHILS ABSOLUTE COUNT: 6.3 10*9/L (ref 2.0–7.5)
NEUTROPHILS RELATIVE PERCENT: 61.4 %
PLATELET COUNT: 334 10*9/L (ref 150–440)
RED BLOOD CELL COUNT: 3.61 10*12/L — ABNORMAL LOW (ref 4.00–5.20)
RED CELL DISTRIBUTION WIDTH: 14.6 % (ref 12.0–15.0)
WBC ADJUSTED: 10.3 10*9/L (ref 4.5–11.0)

## 2018-01-03 LAB — EOSINOPHILS RELATIVE PERCENT: Lab: 1.3

## 2018-01-03 LAB — GLUCOSE RANDOM: Glucose:MCnc:Pt:Ser/Plas:Qn:: 115

## 2018-01-03 NOTE — Unmapped (Signed)
Labs drawn and sent for analysis.  Care provided by  Brooks Sailors, RN

## 2018-01-03 NOTE — Unmapped (Addendum)
It was good to see you.    We'll see you back in three months.    If your leukemia test comes back worse, we will need to see you back sooner.    You could try Aspercreme to help with the knee pain.    Lab on 01/03/2018   Component Date Value Ref Range Status   ??? Collection 01/03/2018 Collected   Final   ??? WBC 01/03/2018 10.3  4.5 - 11.0 10*9/L Final   ??? RBC 01/03/2018 3.61* 4.00 - 5.20 10*12/L Final   ??? HGB 01/03/2018 10.9* 12.0 - 16.0 g/dL Final   ??? HCT 16/10/9602 34.1* 36.0 - 46.0 % Final   ??? MCV 01/03/2018 94.5  80.0 - 100.0 fL Final   ??? MCH 01/03/2018 30.2  26.0 - 34.0 pg Final   ??? MCHC 01/03/2018 31.9  31.0 - 37.0 g/dL Final   ??? RDW 54/09/8117 14.6  12.0 - 15.0 % Final   ??? MPV 01/03/2018 8.1  7.0 - 10.0 fL Final   ??? Platelet 01/03/2018 334  150 - 440 10*9/L Final   ??? Neutrophils % 01/03/2018 61.4  % Final   ??? Lymphocytes % 01/03/2018 30.9  % Final   ??? Monocytes % 01/03/2018 5.2  % Final   ??? Eosinophils % 01/03/2018 1.3  % Final   ??? Basophils % 01/03/2018 0.4  % Final   ??? Absolute Neutrophils 01/03/2018 6.3  2.0 - 7.5 10*9/L Final   ??? Absolute Lymphocytes 01/03/2018 3.2  1.5 - 5.0 10*9/L Final   ??? Absolute Monocytes 01/03/2018 0.5  0.2 - 0.8 10*9/L Final   ??? Absolute Eosinophils 01/03/2018 0.1  0.0 - 0.4 10*9/L Final   ??? Absolute Basophils 01/03/2018 0.0  0.0 - 0.1 10*9/L Final   ??? Large Unstained Cells 01/03/2018 1  0 - 4 % Final   ??? Macrocytosis 01/03/2018 Slight* Not Present Final   ??? Hypochromasia 01/03/2018 Slight* Not Present Final

## 2018-01-15 NOTE — Unmapped (Signed)
[facility] Refill Coordination Note    Specialty Medication(s) to be Shipped:   Hematology/Oncology: Imatinib 400mg        Kimberly Mathis, DOB: Jun 01, 1965  Phone: [phone] (home)       All above HIPAA information was verified with patient's caregiver.     Completed refill call assessment today to schedule patient's medication shipment from the [facility] [phone]).       Specialty medication(s) and dose(s) confirmed: Regimen is correct and unchanged.   Changes to medications: Kimberly Mathis reports no changes reported at this time.  Changes to insurance: No  Questions for the pharmacist: No    The patient will receive a drug information handout for each medication shipped and additional FDA Medication Guides as required.      DISEASE/MEDICATION-SPECIFIC INFORMATION        N/A    ADHERENCE     Medication Adherence    Patient reported X missed doses in the last month:  0  Specialty Medication:  imatinib 400 MG  Patient is on additional specialty medications:  No  Patient is on more than two specialty medications:  No  Any gaps in refill history greater than 2 weeks in the last 3 months:  no  Demonstrates understanding of importance of adherence:  yes  Informant:  caregiver  Reliability of informant:  reliable  Provider-estimated medication adherence level:  good          Support network for adherence:  home health agency      Confirmed plan for next specialty medication refill:  delivery by pharmacy          Refill Coordination    Has the Patients' Contact Information Changed:  No  Is the Shipping Address Different:  No         MEDICARE PART B DOCUMENTATION     imatinib 400 MG: Patient has 10 days worth on hand.    SHIPPING     Shipping address confirmed in Epic.     Delivery Scheduled: Yes, Expected medication delivery date: 01/25/2018 via UPS or courier.     Medication will be delivered via Next Day Courier to the home address in [employer] [place].    [name]   [facility] Specialty Technician

## 2018-01-24 MED FILL — IMATINIB 400 MG TABLET: 30 days supply | Qty: 30 | Fill #5 | Status: AC

## 2018-01-24 MED FILL — IMATINIB 400 MG TABLET: ORAL | 30 days supply | Qty: 30 | Fill #5

## 2018-02-12 NOTE — Unmapped (Signed)
[facility] Refill Coordination Note    Specialty Medication(s) to be Shipped:   Hematology/Oncology: imatinib 400 MG       Romesha Scherer, DOB: [birth year]  Phone: [phone] (home)       All above HIPAA information was verified with patient's caregiver.      Completed refill call assessment today to schedule patient's medication shipment from the [facility] [phone]).       Specialty medication(s) and dose(s) confirmed: Regimen is correct and unchanged.   Changes to medications: Fruma reports no changes reported at this time.  Changes to insurance: No  Questions for the pharmacist: No    The patient will receive a drug information handout for each medication shipped and additional FDA Medication Guides as required.      DISEASE/MEDICATION-SPECIFIC INFORMATION        N/A    ADHERENCE     Medication Adherence    Patient reported X missed doses in the last month:  0  Specialty Medication:  imatinib 400 MG  Patient is on additional specialty medications:  No  Patient is on more than two specialty medications:  No  Any gaps in refill history greater than 2 weeks in the last 3 months:  no  Demonstrates understanding of importance of adherence:  yes  Informant:  caregiver  Reliability of informant:  reliable  Support network for adherence:  home health agency  Confirmed plan for next specialty medication refill:  delivery by pharmacy          Refill Coordination    Has the Patients' Contact Information Changed:  No  Is the Shipping Address Different:  No         MEDICARE PART B DOCUMENTATION     imatinib 400 MG: Patient has 13 days worth on hand.    SHIPPING     Shipping address confirmed in Epic.     Delivery Scheduled: Yes, Expected medication delivery date: 02/22/2018 via UPS or courier.     Medication will be delivered via Next Day Courier to the home address in [employer] [place].    [name]   [facility] Specialty Technician

## 2018-02-20 MED FILL — IMATINIB 400 MG TABLET: 30 days supply | Qty: 30 | Fill #6 | Status: AC

## 2018-02-20 MED FILL — IMATINIB 400 MG TABLET: ORAL | 30 days supply | Qty: 30 | Fill #6

## 2018-03-27 NOTE — Unmapped (Signed)
03/27/18    Travel Screening Questions Completed.    Travel Screening Questions/Answers:  1). Have you traveled within the last 14 days?: No  2). Do you have new or worsening respiratory symptoms (e.g. cough, difficulty breathing)?: No  3). Have you had close contact with a person with confirmed COVID-19 in the last 14 days before symptoms began?: No            The call was handled in the following manner:   June notified but wants lab appt moved to the same day as the doctor appt.    Please call to reschedule lab appt.

## 2018-03-27 NOTE — Unmapped (Signed)
[facility] Clinical Assessment & Refill Coordination Note    Danine Hor, DOB: 1965-07-06  Phone: [phone] (home)     All above HIPAA information was verified with patient's caregiver. Jane     Specialty Medication(s):   Hematology/Oncology: Imatinib     Current Outpatient Medications   Medication Sig Dispense Refill   ??? ACETAMINOPHEN ORAL Take 650 mg by mouth Three (3) times a day.     ??? allopurinol (ZYLOPRIM) 100 MG tablet Take 100 mg by mouth daily.     ??? B complex vitamins (B COMPLEX 1) tablet Take 1 tablet by mouth daily. 30 tablet 11   ??? Clozapine 150 mg TbDL 150 mg daily. Take 150 mg po QAM. Take 300 mg po Q1800.     ??? divalproex (DEPAKOTE) 500 MG DR tablet Take 500 mg by mouth daily at 10am. Pt takes 500mg  q am and Pt 750 mg @ 1800/     ??? docusate sodium (COLACE) 100 MG capsule Take 100 mg by mouth daily.     ??? folic acid (FOLVITE) 1 MG tablet Take 1 mg by mouth daily.     ??? glycopyrrolate (ROBINUL) 1 mg tablet Take 1 mg by mouth Two (2) times a day.     ??? imatinib (GLEEVEC) 400 MG tablet Take 1 tablet (400 mg total) by mouth daily. 30 each 11   ??? lisinopril (ZESTRIL) 2.5 MG tablet Take 1 tablet (2.5 mg total) by mouth daily. 30 tablet 11   ??? magnesium hydroxide (MILK OF MAGNESIUM CONCENTRATED) 2,400 mg/10 mL Susp Take 10 mL by mouth.     ??? metFORMIN (GLUCOPHAGE) 500 MG tablet Take 1,000 mg by mouth daily with breakfast.      ??? methyl salicylate liquid Apply 1 application topically as needed.     ??? metoprolol succinate (TOPROL-XL) 100 MG 24 hr tablet      ??? multivitamin (TAB-A-VITE/THERAGRAN) per tablet Take 1 tablet by mouth daily.     ??? OMEGA-3/DHA/EPA/FISH OIL (FISH OIL-OMEGA-3 FATTY ACIDS) 300-1,000 mg capsule Take 1 g by mouth daily.     ??? pravastatin (PRAVACHOL) 10 MG tablet Take 1 tablet (10 mg total) by mouth daily. 90 tablet 3   ??? pyridoxine, vitamin B6, (B-6) 100 MG tablet Take 200 mg by mouth daily.     ??? ranitidine (ZANTAC) 150 MG tablet Take 150 mg by mouth Two (2) times a day.     ??? zinc sulfate 220 mg Tab tablet Take 1 tablet (220 mg total) by mouth daily. 30 tablet 0     No current facility-administered medications for this visit.         Changes to medications: Leyli reports no changes reported at this time.    No Known Allergies    Changes to allergies: No    SPECIALTY MEDICATION ADHERENCE     Imatinib 400 mg: 5 days of medicine on hand       Medication Adherence    Patient reported X missed doses in the last month:  0  Specialty Medication:  imatinib   Patient is on additional specialty medications:  No  Support network for adherence:  home health agency          Specialty medication(s) dose(s) confirmed: Regimen is correct and unchanged.     Are there any concerns with adherence? No    Adherence counseling provided? Not needed    CLINICAL MANAGEMENT AND INTERVENTION      Clinical Benefit Assessment:  Do you feel the medicine is effective or helping your condition? No    Clinical Benefit counseling provided? Not needed    Adverse Effects Assessment:    Are you experiencing any side effects? No    Are you experiencing difficulty administering your medicine? No    Quality of Life Assessment:    How many days over the past month did your cancer  keep you from your normal activities? For example, brushing your teeth or getting up in the morning. 0    Have you discussed this with your provider? Not needed    Therapy Appropriateness:    Is therapy appropriate? Yes, therapy is appropriate and should be continued    DISEASE/MEDICATION-SPECIFIC INFORMATION      N/A    PATIENT SPECIFIC NEEDS     ? Does the patient have any physical, cognitive, or cultural barriers? No    ? Is the patient high risk? No     ? Does the patient require a Care Management Plan? No     ? Does the patient require physician intervention or other additional services (i.e. nutrition, smoking cessation, social work)? No      SHIPPING     Specialty Medication(s) to be Shipped: Hematology/Oncology: Imatinib    Other medication(s) to be shipped: n/a     Changes to insurance: No    Delivery Scheduled: Yes, Expected medication delivery date: 03/29/18.     Medication will be delivered via UPS to the confirmed home address in [facility].    The patient will receive a drug information handout for each medication shipped and additional FDA Medication Guides as required.  Verified that patient has previously received a Conservation officer, historic buildings.    Brayley Mackowiak  Anders Grant   [facility] Specialty Pharmacist

## 2018-03-28 MED FILL — IMATINIB 400 MG TABLET: ORAL | 30 days supply | Qty: 30 | Fill #7

## 2018-03-28 MED FILL — IMATINIB 400 MG TABLET: 30 days supply | Qty: 30 | Fill #7 | Status: AC

## 2018-03-31 DIAGNOSIS — F22 Delusional disorders: Principal | ICD-10-CM

## 2018-03-31 DIAGNOSIS — E119 Type 2 diabetes mellitus without complications: Principal | ICD-10-CM

## 2018-03-31 DIAGNOSIS — R45851 Suicidal ideations: Principal | ICD-10-CM

## 2018-03-31 DIAGNOSIS — M810 Age-related osteoporosis without current pathological fracture: Principal | ICD-10-CM

## 2018-03-31 DIAGNOSIS — M109 Gout, unspecified: Principal | ICD-10-CM

## 2018-03-31 DIAGNOSIS — R4689 Other symptoms and signs involving appearance and behavior: Principal | ICD-10-CM

## 2018-03-31 DIAGNOSIS — C9210 Chronic myeloid leukemia, BCR/ABL-positive, not having achieved remission: Principal | ICD-10-CM

## 2018-03-31 DIAGNOSIS — F250 Schizoaffective disorder, bipolar type: Principal | ICD-10-CM

## 2018-03-31 DIAGNOSIS — K521 Toxic gastroenteritis and colitis: Principal | ICD-10-CM

## 2018-03-31 DIAGNOSIS — K219 Gastro-esophageal reflux disease without esophagitis: Principal | ICD-10-CM

## 2018-03-31 DIAGNOSIS — N390 Urinary tract infection, site not specified: Principal | ICD-10-CM

## 2018-03-31 DIAGNOSIS — R45850 Homicidal ideations: Principal | ICD-10-CM

## 2018-03-31 DIAGNOSIS — I10 Essential (primary) hypertension: Principal | ICD-10-CM

## 2018-03-31 DIAGNOSIS — T3695XA Adverse effect of unspecified systemic antibiotic, initial encounter: Principal | ICD-10-CM

## 2018-03-31 DIAGNOSIS — Z87891 Personal history of nicotine dependence: Principal | ICD-10-CM

## 2018-03-31 DIAGNOSIS — Z7984 Long term (current) use of oral hypoglycemic drugs: Principal | ICD-10-CM

## 2018-03-31 LAB — URINALYSIS WITH CULTURE REFLEX
BLOOD UA: NEGATIVE
GLUCOSE UA: NEGATIVE
LEUKOCYTE ESTERASE UA: NEGATIVE
NITRITE UA: POSITIVE — AB
PH UA: 5.5 (ref 5.0–8.0)
SPECIFIC GRAVITY UA: >=1.03 — ABNORMAL HIGH (ref 1.016–1.022)
SQUAMOUS EPITHELIAL: 3 /HPF (ref 0–5)
UROBILINOGEN UA: 0.2
WBC UA: 2 /HPF (ref 0–2)

## 2018-03-31 LAB — DRUG SCREEN, URINE
AMPHETAMINE SCREEN URINE: NEGATIVE
BARBITURATE SCREEN URINE: NEGATIVE
BENZODIAZEPINE SCREEN, URINE: NEGATIVE
COCAINE(METAB.)SCREEN, URINE: NEGATIVE
FENTANYL SCREEN, URINE: NEGATIVE
MDMA SCREEN, URINE: NEGATIVE
METHADONE METABOLITE: NEGATIVE
METHADONE SCREEN, URINE: NEGATIVE
OXYCODONE SCREEN URINE: NEGATIVE

## 2018-03-31 LAB — ETHANOL: ETHANOL: <20 mg/dL (ref <=20.0)

## 2018-04-01 ENCOUNTER — Ambulatory Visit
Admit: 2018-04-01 | Discharge: 2018-04-08 | Disposition: A | Discharge status: Home / Self Care | Payer: MEDICARE | Admitting: Mental Health

## 2018-04-01 DIAGNOSIS — I10 Essential (primary) hypertension: Principal | ICD-10-CM

## 2018-04-01 DIAGNOSIS — F250 Schizoaffective disorder, bipolar type: Principal | ICD-10-CM

## 2018-04-01 DIAGNOSIS — M810 Age-related osteoporosis without current pathological fracture: Principal | ICD-10-CM

## 2018-04-01 DIAGNOSIS — K219 Gastro-esophageal reflux disease without esophagitis: Principal | ICD-10-CM

## 2018-04-01 DIAGNOSIS — E119 Type 2 diabetes mellitus without complications: Principal | ICD-10-CM

## 2018-04-01 DIAGNOSIS — M109 Gout, unspecified: Principal | ICD-10-CM

## 2018-04-01 LAB — COMPREHENSIVE METABOLIC PANEL
ALBUMIN: 4.2 g/dL (ref 3.4–5.0)
ALKALINE PHOSPHATASE: 65 U/L (ref 46–116)
ALT (SGPT): 17 U/L (ref 7–40)
ANION GAP: 11 mmol/L (ref 9–15)
AST (SGOT): 23 U/L (ref 13–40)
BILIRUBIN TOTAL: 0.2 mg/dL — ABNORMAL LOW (ref 0.3–1.2)
BLOOD UREA NITROGEN: 9 mg/dL (ref 9–23)
BUN / CREAT RATIO: 14
CALCIUM: 10 mg/dL (ref 8.3–10.6)
CHLORIDE: 106 mmol/L (ref 98–107)
CO2: 25.8 mmol/L (ref 20.0–31.0)
CREATININE: 0.66 mg/dL (ref 0.50–0.80)
EGFR CKD-EPI AA FEMALE: >90 mL/min/{1.73_m2} (ref >=60)
EGFR CKD-EPI AA FEMALE: >90 mL/min/1.73m2 — ABNORMAL HIGH (ref >=60–116)
EGFR CKD-EPI NON-AA FEMALE: >90 mL/min/{1.73_m2} (ref >=60)
GLUCOSE RANDOM: 111 mg/dL — ABNORMAL HIGH (ref 70–99)
PROTEIN TOTAL: 7.4 g/dL (ref 5.7–8.2)

## 2018-04-01 LAB — CBC W/ AUTO DIFF
BASOPHILS ABSOLUTE COUNT: 0.1 10*9/L (ref 0.0–0.1)
BASOPHILS RELATIVE PERCENT: 0.4 %
EOSINOPHILS ABSOLUTE COUNT: 0.2 10*9/L (ref 0.0–0.4)
HEMATOCRIT: 32.8 % — ABNORMAL LOW (ref 34.1–44.9)
HEMOGLOBIN: 10.6 g/dL — ABNORMAL LOW (ref 11.2–15.7)
IMMATURE GRANULOCYTES ABSOLUTE COUNT: 0 10*9/L (ref 0.0–1.0)
LYMPHOCYTES ABSOLUTE COUNT: 4.5 10*9/L — ABNORMAL HIGH (ref 1.3–3.6)
LYMPHOCYTES RELATIVE PERCENT: 36.6 %
MEAN CORPUSCULAR HEMOGLOBIN CONC: 32.3 g/dL (ref 32.2–35.5)
MEAN CORPUSCULAR HEMOGLOBIN: 30.1 pg (ref 25.7–32.2)
MEAN CORPUSCULAR VOLUME: 93.2 fL (ref 79.4–94.8)
MEAN PLATELET VOLUME: 10 fL (ref 9.4–12.4)
MONOCYTES ABSOLUTE COUNT: 0.9 10*9/L — ABNORMAL HIGH (ref 0.2–0.4)
NEUTROPHILS ABSOLUTE COUNT: 6.6 10*9/L — ABNORMAL HIGH (ref 1.6–6.1)
NEUTROPHILS RELATIVE PERCENT: 54.3 %
PLATELET COUNT: 344 10*9/L (ref 182–369)
RED BLOOD CELL COUNT: 3.52 10*12/L — ABNORMAL LOW (ref 3.93–5.22)
RED CELL DISTRIBUTION WIDTH: 14.7 % — ABNORMAL HIGH (ref 11.6–14.4)
RED CELL DISTRIBUTION WIDTH: 14.7 % — ABNORMAL HIGH (ref 11.6–14.4)
WBC ADJUSTED: 12.2 10*9/L — ABNORMAL HIGH (ref 4.0–10.0)

## 2018-04-01 LAB — DRUG SCREEN, URINE: METHADONE SCREEN, URINE: NEGATIVE

## 2018-04-01 LAB — URINALYSIS WITH CULTURE REFLEX

## 2018-04-01 NOTE — Unmapped (Signed)
Per Bobbi in lab, ETOH will be resulted in 10 minutes.  Lab has had specimen for over 1 hour.

## 2018-04-01 NOTE — Unmapped (Signed)
Pt now in MH #4 for initial eval with Tresa Endo, [facility] nurse. Pt has been calm and cooperative since her arrival to ED.

## 2018-04-01 NOTE — Unmapped (Signed)
Emergency Department Provider Note  ____________________________________________    Time seen: March 31, 2018 11:10 PM    I have reviewed the triage vital signs and the nursing notes.     History     Chief Complaint  Mental Health Evaluation      HPI   Kimberly Mathis is a 53 y.o. female  who presents to the ER with complaints of suicidal ideations, homicidal ideations, auditory hallucinations, and visual hallucinations. The patient states that she has had the symptoms for a while, but they have been worsening recently. The patient admits to taking their current prescribed psychiatric medications.  She admits to being aggressive towards others at her group home.  She would like help with the worsening auditory hallucination that are causing her to have the worsening suicidal ideations and homicidal ideations.  Patient denies any other associated medical complaints at this time including fever, chills, chest pain, SOB, nausea, vomiting, abdominal pain, diarrhea, constipation, or urinary complaints.       Past Medical History:   Diagnosis Date   ??? GERD (gastroesophageal reflux disease)    ??? Gout    ??? HTN (hypertension)    ??? Morbid obesity (CMS-HCC)    ??? Osteoporosis    ??? Schizoaffective disorder, bipolar type (CMS-HCC)    ??? Type II diabetes mellitus (CMS-HCC)        Patient Active Problem List   Diagnosis   ??? Type II diabetes mellitus (CMS-HCC)   ??? HTN (hypertension)   ??? Gout   ??? GERD (gastroesophageal reflux disease)   ??? Morbid obesity (CMS-HCC)   ??? Osteoporosis   ??? Schizoaffective disorder, bipolar type (CMS-HCC)   ??? CML (chronic myelocytic leukemia) (CMS-HCC)       No past surgical history on file.      Current Facility-Administered Medications:   ???  nitrofurantoin (macrocrystal-monohydrate) (MACROBID) 100 MG capsule 100 mg, 100 mg, Oral, Q12H SCH, Noel Journey, [place]    Current Outpatient Medications:   ???  ACETAMINOPHEN ORAL, Take 650 mg by mouth Three (3) times a day., Disp: , Rfl:   ???  allopurinol (ZYLOPRIM) 100 MG tablet, Take 100 mg by mouth daily., Disp: , Rfl:   ???  B complex vitamins (B COMPLEX 1) tablet, Take 1 tablet by mouth daily., Disp: 30 tablet, Rfl: 11  ???  chlorhexidine (PERIDEX) 0.12 % solution, chlorhexidine gluconate 0.12 % mouthwash, Disp: , Rfl:   ???  Clozapine 150 mg TbDL, 150 mg daily. Take 150 mg po QAM. Take 300 mg po Q1800., Disp: , Rfl:   ???  divalproex (DEPAKOTE) 500 MG DR tablet, Take 500 mg by mouth daily at 10am. Pt takes 500mg  q am and Pt 750 mg @ 1800/, Disp: , Rfl:   ???  docusate sodium (COLACE) 100 MG capsule, Take 100 mg by mouth daily., Disp: , Rfl:   ???  folic acid (FOLVITE) 1 MG tablet, Take 1 mg by mouth daily., Disp: , Rfl:   ???  glycopyrrolate (ROBINUL) 1 mg tablet, Take 1 mg by mouth Two (2) times a day., Disp: , Rfl:   ???  imatinib (GLEEVEC) 400 MG tablet, Take 1 tablet (400 mg total) by mouth daily., Disp: 30 each, Rfl: 11  ???  lisinopril (ZESTRIL) 2.5 MG tablet, Take 1 tablet (2.5 mg total) by mouth daily., Disp: 30 tablet, Rfl: 11  ???  magnesium hydroxide (MILK OF MAGNESIUM CONCENTRATED) 2,400 mg/10 mL Susp, Take 10 mL by mouth., Disp: , Rfl:   ???  metFORMIN (GLUCOPHAGE) 500 MG tablet, Take 1,000 mg by mouth daily with breakfast. , Disp: , Rfl:   ???  methyl salicylate liquid, Apply 1 application topically as needed., Disp: , Rfl:   ???  methyl salicylate-menthol 30-10 % Crea, Cool Heat (methyl salicylate-menthol) 30 %-10 % topical cream  1 application topically twice a day, Disp: , Rfl:   ???  metoprolol succinate (TOPROL-XL) 100 MG 24 hr tablet, , Disp: , Rfl:   ???  multivitamin (TAB-A-VITE/THERAGRAN) per tablet, Take 1 tablet by mouth daily., Disp: , Rfl:   ???  OMEGA-3/DHA/EPA/FISH OIL (FISH OIL-OMEGA-3 FATTY ACIDS) 300-1,000 mg capsule, Take 1 g by mouth daily., Disp: , Rfl:   ???  pravastatin (PRAVACHOL) 10 MG tablet, Take 1 tablet (10 mg total) by mouth daily., Disp: 90 tablet, Rfl: 3  ???  pyridoxine, vitamin B6, (B-6) 100 MG tablet, Take 200 mg by mouth daily., Disp: , Rfl:   ???  ranitidine (ZANTAC) 150 MG tablet, Take 150 mg by mouth Two (2) times a day., Disp: , Rfl:   ???  senna (SENOKOT) 8.6 mg tablet, Senna Lax 8.6 mg tablet  TAKE 2 TABLETS (17.2MG )BY MOUTH DAILY., Disp: , Rfl:   ???  zinc sulfate 220 mg Tab tablet, Take 1 tablet (220 mg total) by mouth daily., Disp: 30 tablet, Rfl: 0    Allergies  Patient has no known allergies.    History reviewed. No pertinent family history.    Social History  Social History     Tobacco Use   ??? Smoking status: Former Smoker     Last attempt to quit: 2016     Years since quitting: 4.2   ??? Smokeless tobacco: Never Used   Substance Use Topics   ??? Alcohol use: No   ??? Drug use: No       Review of Systems    Unable to assess full ROS secondary to psychiatric illness. ROS completed is documented in HPI as above.      Physical Exam     VITAL SIGNS:    ED Triage Vitals [03/31/18 2133]   Enc Vitals Group      BP 122/78      Heart Rate 92      SpO2 Pulse       Resp 20      Temp 37.1 ??C (98.7 ??F)      Temp Source Oral      SpO2 96 %      Weight (!) 130.2 kg (287 lb)      Height 1.727 m (5' 8)      Head Circumference       Peak Flow       Pain Score       Pain Loc       Pain Edu?       Excl. in GC?        CONSTITUTIONAL: Alert and oriented. Well-appearing; well-nourished; in no acute distress  HEAD: Normocephalic; atraumatic  EYES: PERRL, Conjunctivae clear, sclerae non-icteric  ENT: Normal appearing mucous membranes  NECK: Supple; non-tender; no cervical lymphadenopathy   CARD: RRR; no murmurs, no clicks, no rubs, no gallops; symmetric distal pulses  RESP: Normal chest expansion without tachypnea; breath sounds clear and equal bilaterally; no wheezes, no rhonchi, no rales   ABD/GI: Abdomen is soft, without distention, and is non-tender. There is no rebound, guarding, palpable organomegaly, or masses.   MUSK: Normal ROM without edema     NEURO: Alert. Normal strength and  sensation in all extremities.   SKIN: Normal color for age and race; warm; dry; good turgor; no acute lesions noted  PSYCH: The patient is acting appropriately.  Her mood and affect are normal. The patient's speech is rapid & pressured.  Her behavior is normal. The patient's thought content is paranoid, delusional, suicidal, and homicidal.    Results for orders placed or performed during the hospital encounter of 03/31/18   Ethanol   Result Value Ref Range    Alcohol, Ethyl <20.0 <=20.0 mg/dL   Urinalysis with Culture Reflex   Result Value Ref Range    Color, UA Yellow     Clarity, UA Clear     Specific Gravity, UA >=1.030 (H) 1.016 - 1.022    pH, UA 5.5 5.0 - 8.0    Leukocyte Esterase, UA Negative Negative    Nitrite, UA Positive (A) Negative    Protein, UA Trace (A) Negative    Glucose, UA Negative Negative    Ketones, UA Trace (A) Negative    Urobilinogen, UA 0.2 mg/dL     Bilirubin, UA Small (A) Negative    Blood, UA Negative Negative    WBC, UA 2 0 - 2 /HPF    Squam Epithel, UA 3 0 - 5 /HPF    Bacteria, UA Moderate (A) None Seen /HPF   Drug Screen, Urine   Result Value Ref Range    Amphetamine Screen, Ur Negative Negative    Barbiturate Screen, Ur Negative Negative    Benzodiazepine Screen, Urine Negative Negative    Cocaine(Metab.)Screen, Urine Negative Negative    Cannabinoid Scrn, Ur Negative Negative    MDMA URINE Negative Negative    Methadone Metabolite Negative Negative    Methadone Screen, Urine Negative Negative    Opiate Scrn, Ur Negative Negative    Oxycodone Screen, Ur Negative Negative    PCP Screen, Urine Negative Negative    Fentanyl Screen, Urine Negative Negative   Pregnancy Qualitative, Urine   Result Value Ref Range    Pregnancy Test, Urine Negative Negative        Initial Impression, ED Course, Assessment and Plan     Impression:     Differential Dx: schizophrenia, schizoaffective disorder, bipolar disorder, anxiety, depression, borderline personality disorder, drug abuse, alcohol abuse, thyroid disease, UTI, dementia, medication side effect, medication non-compliance, overdose __________________     ED Observation Note  Patient placed in ED Observation Status on 03/31/2018 at 23:10 PM    ED Observation Status: The reason for observation is to continue the patient's evaluation and treatment to determine if her clinical course requires a full hospital admission or if she can be safely discharged home. My plan is to await further psychiatric evaluation to determine the patient's final disposition.    ED Observation Progress:     ED Course as of Mar 31 340   Mon Apr 01, 2018   0008 Patient is medically clear. She does have a UTI and medications were ordered.       0209 Patient was seen by the mental health screener who apparently spoke with Dr. Diamond Nickel regarding this patient. They felt that the patient could be discharged home, but after further discussion with the screener it seems that the patient has been physically aggressive towards others at the group home.  I advised that I did not feel comfortable letting the patient discharge at this time.  She will be reevaluated by psychiatry in the morning.      [phone] The patient will  be reevaluated by psychiatry in the morning.  If she is discharged home, she will need a prescription for an antibiotic to treat her UTI.  Her final disposition is pending psychiatric reevaluation.  Care will be transitioned to the oncoming provider at change of shift in the morning.          Additionally, please refer to other progress notes during the patient's clinical course      ED Clinical Impression     Final diagnoses:   Delusions (CMS-HCC) (Primary)   Suicidal ideations   Homicidal ideations   Aggressive behavior   Acute UTI   '       Noel Journey, [place]  04/01/18 [phone]

## 2018-04-01 NOTE — Unmapped (Signed)
[facility]   Psychiatry   Initial Psychiatric Evaluation    Service Date: 04/01/18  Admit Date/Time: 03/31/2018  Location of patient: Inpatient  Attending: Radene Knee, MD      Reason for Admission: psychosis/agitation/aggression      Admission Status: Involuntary      HPI:  Kimberly Mathis is a 53 y.o. female who is admitted to the hospital on a  Involuntary  Basis for agitation/aggression and CAH. She says she was aggressive and broke things in the house at her group home. She is experiencing CAH, voices telling her to kill herself and if she doesn't hit them, beat them up or at least try they would kill me. She is tangential and hyper verbal. She says she does not get enough food or cigarettes and can't go back there. She makes several paranoid statements about her group home & her guardian. She had been attending a day program prior to Covid-19 but spends much more time at the group home currently due to social distancing requirements. She has a long history of inpatient psychiatric hospitalizations. Her last hospitalization was at [facility] for nearly two and a half years. She was discharged to her current group home and she's been living there for about 9 months. She says she takes her medications. Thought process is circumfrential and she tends to focus on her ability to obtain cigarettes and food at the group home.     I spoke with [name] from the [facility]. She says pt has been overall stable since arriving at current [facility] 9 months ago. She has been compliant with meds and follows up regularly with Dr. Mayford Knife at [facility]. Her next appt was previously scheduled for next week and will need to be rescheduled. She reports very recent aggression and agitation toward [name] staff. Pt had been doing well until the day she was brought into the ER. They had been trying to keep all of their residents out of the hospital due to the pandemic but pts behavior continued to escalate and they felt it was necessary for her to come into the hospital. Pt had gotten agitated with staff and tried to break her walker by slamming it on the ground. She threw a flower arrangement at [facility] staff and attempted to flip a table. [facility] staff contacted [name] to see if she could help de-escalate pt over the phone but pt slammed the phone down. She says pt usually has some agitation when she runs out of cigarettes but never to this extent. She provided ER staff with recent MAR. Currently getting cloazaril blood draws monthly & has been stable on current regimen since discharging from [facility] last JUne. Pt may return to the group home when stable.       Past Psych Hx:   Outpatient treatment - follows up with Dr. Mayford Knife at [facility]  Hospitalizations - multiple psych admissions to [place], [place], [facility]. Last at [facility] for 2.5 years prior to current [facility] placement  Self Harm / Suicide Attempts - years ago, says she pulled the toilet out of the floor while hospitalized  Harm to others - physical/verbal aggression toward [facility] staff. Long history of property destruction/aggression per pt    Risk Assessment:                  Risk of harm to self:  History in past six months   no                             Lifetime                                  yes                  Risk of harm to others:                             History in past six months   yes                             Lifetime                                  yes          Substance Abuse Hx: UDS negative, BAL <20.    Allergies:  No Known Allergies    Medications:     Current Facility-Administered Medications:   ???  nitrofurantoin (macrocrystal-monohydrate) (MACROBID) 100 MG capsule 100 mg, 100 mg, Oral, Q12H SCH, Noel Journey, PA, 100 mg at 04/01/18 1144    Current Outpatient Medications:   ???  ACETAMINOPHEN ORAL, Take 650 mg by mouth Three (3) times a day., Disp: , Rfl:   ???  allopurinol (ZYLOPRIM) 100 MG tablet, Take 100 mg by mouth daily., Disp: , Rfl:   ???  B complex vitamins (B COMPLEX 1) tablet, Take 1 tablet by mouth daily., Disp: 30 tablet, Rfl: 11  ???  chlorhexidine (PERIDEX) 0.12 % solution, chlorhexidine gluconate 0.12 % mouthwash, Disp: , Rfl:   ???  Clozapine 150 mg TbDL, 150 mg daily. Take 150 mg po QAM. Take 300 mg po Q1800., Disp: , Rfl:   ???  divalproex (DEPAKOTE) 500 MG DR tablet, Take 500 mg by mouth daily at 10am. Pt takes 500mg  q am and Pt 750 mg @ 1800/, Disp: , Rfl:   ???  docusate sodium (COLACE) 100 MG capsule, Take 100 mg by mouth daily., Disp: , Rfl:   ???  folic acid (FOLVITE) 1 MG tablet, Take 1 mg by mouth daily., Disp: , Rfl:   ???  glycopyrrolate (ROBINUL) 1 mg tablet, Take 1 mg by mouth Two (2) times a day., Disp: , Rfl:   ???  imatinib (GLEEVEC) 400 MG tablet, Take 1 tablet (400 mg total) by mouth daily., Disp: 30 each, Rfl: 11  ???  lisinopril (ZESTRIL) 2.5 MG tablet, Take 1 tablet (2.5 mg total) by mouth daily., Disp: 30 tablet, Rfl: 11  ???  magnesium hydroxide (MILK OF MAGNESIUM CONCENTRATED) 2,400 mg/10 mL Susp, Take 10 mL by mouth., Disp: , Rfl:   ???  metFORMIN (GLUCOPHAGE) 500 MG tablet, Take 1,000 mg by mouth daily with breakfast. , Disp: , Rfl:   ???  methyl salicylate liquid, Apply 1 application topically as needed., Disp: , Rfl:   ???  methyl salicylate-menthol 30-10 % Crea, Cool Heat (methyl salicylate-menthol) 30 %-10 % topical cream  1 application topically twice a day, Disp: , Rfl:   ???  metoprolol succinate (TOPROL-XL) 100 MG 24 hr tablet, , Disp: , Rfl:   ???  multivitamin (TAB-A-VITE/THERAGRAN) per tablet, Take 1 tablet by mouth daily., Disp: , Rfl:   ???  OMEGA-3/DHA/EPA/FISH OIL (FISH OIL-OMEGA-3 FATTY ACIDS) 300-1,000 mg capsule, Take 1 g by mouth daily., Disp: , Rfl:   ???  pravastatin (PRAVACHOL) 10 MG tablet, Take 1 tablet (10 mg total) by mouth daily., Disp: 90 tablet, Rfl: 3  ???  pyridoxine, vitamin B6, (B-6) 100 MG tablet, Take 200 mg by mouth daily., Disp: , Rfl:   ???  ranitidine (ZANTAC) 150 MG tablet, Take 150 mg by mouth Two (2) times a day., Disp: , Rfl:   ???  senna (SENOKOT) 8.6 mg tablet, Senna Lax 8.6 mg tablet  TAKE 2 TABLETS (17.2MG )BY MOUTH DAILY., Disp: , Rfl:   ???  zinc sulfate 220 mg Tab tablet, Take 1 tablet (220 mg total) by mouth daily., Disp: 30 tablet, Rfl: 0    Medical History:  Past Medical History:   Diagnosis Date   ??? GERD (gastroesophageal reflux disease)    ??? Gout    ??? HTN (hypertension)    ??? Morbid obesity (CMS-HCC)    ??? Osteoporosis    ??? Schizoaffective disorder, bipolar type (CMS-HCC)    ??? Type II diabetes mellitus (CMS-HCC)        Surgical History:  No past surgical history on file.    Social History:  Social History     Socioeconomic History   ??? Marital status: Single     Spouse name: Not on file   ??? Number of children: Not on file   ??? Years of education: Not on file   ??? Highest education level: Not on file   Occupational History   ??? Not on file   Social Needs   ??? Financial resource strain: Not very hard   ??? Food insecurity     Worry: Never true     Inability: Never true   ??? Transportation needs     Medical: No     Non-medical: No   Tobacco Use   ??? Smoking status: Former Smoker     Last attempt to quit: 2016     Years since quitting: 4.2   ??? Smokeless tobacco: Never Used   Substance and Sexual Activity   ??? Alcohol use: No   ??? Drug use: No   ??? Sexual activity: Not on file   Lifestyle   ??? Physical activity     Days per week: Patient refused     Minutes per session: Patient refused   ??? Stress: Not on file   Relationships   ??? Social Wellsite geologist on phone: Patient refused     Gets together: Patient refused     Attends religious service: Patient refused     Active member of club or organization: Patient refused     Attends meetings of clubs or organizations: Patient refused     Relationship status: Patient refused   Other Topics Concern   ??? Not on file   Social History Narrative   ??? Not on file       Family History:  History reviewed. No pertinent family history.       Physical Exam:   Physical Exam from the Emergency Department Reviewed.     Vital signs in last 24 hours:     Vitals:    04/01/18 0819   BP: 121/75   Pulse: 90   Resp: 18   Temp: 36.8 ??C (98.2 ??F)   SpO2: 98%  Medical Review Of Systems:  Pertinent items are noted in HPI.    Psychiatric Review Of Systems:  Sleep disturbance: no  appetite changes: no  weight changes: no  anergy: no  Decreased need for sleep: no  anhedonia: no  somatic symptoms: no  anxiety/panic: no  guilty/hopeless: no  S.I.B.s/risky behavior: no  any drugs: no  alcohol: no           Mental Status Exam:  Appearance:    Disheveled, morbidly obese   Motor:   using wheelchair for ambulation, uses walker at [facility]   Speech/Language:    Loud and Pressured   Mood:   irritable   Affect:   Labile and Mood congruent   Thought process:   Loose associations and Tangential   Thought content:    Endorses Homicidal Ideation toward [facility] staff   Perceptual disturbances:     Auditory, voices telling her to harm [facility] staff or they will kill her     Orientation:   Oriented to person, place, time, and general circumstances   Attention:   Poor Attention Span   Concentration:   Easily Distractible   Memory:   Intact to interview    Fund of knowledge:    Consistent with level of education and development   Insight:     Impaired   Judgment:    Impaired   Impulse Control:   Impaired       Test Results:  Data Review:   Lab results last 24 hours:    Recent Results (from the past 24 hour(s))   Ethanol    Collection Time: 03/31/18  9:45 PM   Result Value Ref Range    Alcohol, Ethyl <20.0 <=20.0 mg/dL   Comprehensive Metabolic Panel    Collection Time: 03/31/18  9:45 PM   Result Value Ref Range    Sodium 143 136 - 145 mmol/L    Potassium 4.1 3.5 - 5.1 mmol/L    Chloride 106 98 - 107 mmol/L    Anion Gap 11 9 - 15 mmol/L    CO2 25.8 20.0 - 31.0 mmol/L    BUN 9 9 - 23 mg/dL    Creatinine 9.81 1.91 - 0.80 mg/dL    BUN/Creatinine Ratio 14     EGFR CKD-EPI Non-African American, Female >90 >=60 mL/min/1.57m2    EGFR CKD-EPI African American, Female >90 >=60 mL/min/1.72m2    Glucose 111 (H) 70 - 99 mg/dL    Calcium 47.8 8.3 - 29.5 mg/dL    Albumin 4.2 3.4 - 5.0 g/dL    Total Protein 7.4 5.7 - 8.2 g/dL    Total Bilirubin 0.2 (L) 0.3 - 1.2 mg/dL    AST 23 13 - 40 U/L    ALT 17 7 - 40 U/L    Alkaline Phosphatase 65 46 - 116 U/L   CBC w/ Differential    Collection Time: 03/31/18  9:45 PM   Result Value Ref Range    WBC 12.2 (H) 4.0 - 10.0 10*9/L    RBC 3.52 (L) 3.93 - 5.22 10*12/L    HGB 10.6 (L) 11.2 - 15.7 g/dL    HCT 62.1 (L) 30.8 - 44.9 %    MCV 93.2 79.4 - 94.8 fL    MCH 30.1 25.7 - 32.2 pg    MCHC 32.3 32.2 - 35.5 g/dL    RDW 65.7 (H) 84.6 - 14.4 %    MPV 10.0 9.4 - 12.4 fL    Platelet 344 182 - 369 10*9/L  Neutrophils % 54.3 %    Immature Granulocytes Relative Percent 0.3 %    Lymphocytes % 36.6 %    Monocytes % 7.1 %    Eosinophils % 1.3 %    Basophils % 0.4 %    Absolute Neutrophils 6.6 (H) 1.6 - 6.1 10*9/L    Immature Granulocytes Absolute Count 0.0 0.0 - 1.0 10*9/L    Absolute Lymphocytes 4.5 (H) 1.3 - 3.6 10*9/L    Absolute Monocytes 0.9 (H) 0.2 - 0.4 10*9/L    Absolute Eosinophils 0.2 0.0 - 0.4 10*9/L    Absolute Basophils 0.1 0.0 - 0.1 10*9/L   Urinalysis with Culture Reflex    Collection Time: 03/31/18 10:10 PM   Result Value Ref Range    Color, UA Yellow     Clarity, UA Clear     Specific Gravity, UA >=1.030 (H) 1.016 - 1.022    pH, UA 5.5 5.0 - 8.0    Leukocyte Esterase, UA Negative Negative    Nitrite, UA Positive (A) Negative    Protein, UA Trace (A) Negative    Glucose, UA Negative Negative    Ketones, UA Trace (A) Negative    Urobilinogen, UA 0.2 mg/dL     Bilirubin, UA Small (A) Negative    Blood, UA Negative Negative    WBC, UA 2 0 - 2 /HPF    Squam Epithel, UA 3 0 - 5 /HPF    Bacteria, UA Moderate (A) None Seen /HPF   Urine Culture    Collection Time: 03/31/18 10:10 PM   Result Value Ref Range    Urine Culture, Comprehensive NO GROWTH    Pregnancy Qualitative, Urine    Collection Time: 03/31/18 10:10 PM   Result Value Ref Range    Pregnancy Test, Urine Negative Negative   Drug Screen, Urine    Collection Time: 03/31/18 10:11 PM   Result Value Ref Range    Amphetamine Screen, Ur Negative Negative    Barbiturate Screen, Ur Negative Negative    Benzodiazepine Screen, Urine Negative Negative    Cocaine(Metab.)Screen, Urine Negative Negative    Cannabinoid Scrn, Ur Negative Negative    MDMA URINE Negative Negative    Methadone Metabolite Negative Negative    Methadone Screen, Urine Negative Negative    Opiate Scrn, Ur Negative Negative    Oxycodone Screen, Ur Negative Negative    PCP Screen, Urine Negative Negative    Fentanyl Screen, Urine Negative Negative      Imaging: None      Advance Directives: None     Psychiatric Advance Directives: None    Code Status:  Full Code      Assessment & Diagnosis  DSM V Diagnosis:   Schizoaffective disorder, Bipolar type    Patient Active Problem List   Diagnosis   ??? Type II diabetes mellitus (CMS-HCC)   ??? HTN (hypertension)   ??? Gout   ??? GERD (gastroesophageal reflux disease)   ??? Morbid obesity (CMS-HCC)   ??? Osteoporosis   ??? Schizoaffective disorder, bipolar type (CMS-HCC)   ??? CML (chronic myelocytic leukemia) (CMS-HCC)       Active Problems:    * No active hospital problems. *      Active Problems:    * No active hospital problems. *  Resolved Problems:    * No resolved hospital problems. *      Initial Treatment Plan:   Admit to the Behavioral Health Unit due to psychosis/agitation/aggression.  Regular diet to be ordered.  Patient  to be started on her home medications. She has been adherent to home meds and sees Dr. Mayford Knife at [facility] for outpatient care. She has clozaril labs drawn monthly. Will obtain serum VPA level today. Medical history includes CML, HTN, DM 2, gout, and gerd. Patient is ambulatory and DVT prophlaxis is not indicated.    Risk Assessment:     Risk Stratification based on my safety assessment TRIAGE/Interventions   Low Suicide Risk Follow Facility Policy     Clinical Observation and Risk Level: Based on my clinical assessment of Kimberly Mathis, at this time and in the current clinical setting she represents a Low Suicide Risk    Clinical Note:  Relevant Mental Status Information: alert, oriented to person, place, and time  Methods of Suicide Risk Evaluation: I have reviewed the chart, interviewed her and asked about ideation, intent, plan, and suicidal behaviors (step three), completed a mental status examination, asked about the presence of firearms, and taken into consideration the above??suicide??risk (step one) and protective factors (step two) as I completed my overall risk assessment.  Brief Evaluation Summary:  Collateral Sources Used and Relevant Information Obtained: the patient and the caretaker  Specific Assessment Data to Support Risk Determination: See methods of suicide risk evaluation as documented above.  Rationale for Actions Taken and Not Taken: The patient was scored as No Risk Noted (03/31/18 2136) on her initial [place] Suicide Severity Rating done by the nursing staff. It is my clinical judgment that her observation level should be maintained at Low Suicide Risk at this time. This will be reassessed if there is a clinically significant change in the status of the patient. This judgment is based on our ability to directly address suicide risk, implement suicide prevention strategies and develop a safety plan while she is the current clinical setting.    Based on the above risk assessment the unit level of supervision ordered is:  q15 minutes check on the unit, 1:1 (staff:patient) off the unit    LOS: 7-10 days      Discharge Criteria: Lack of evidence of being a danger to self or others.      Kathlene November, PMHNP  1:24 PM

## 2018-04-01 NOTE — Unmapped (Signed)
Pt having initial evaluation by Lauro Franklin, PA.  Pt has been calm and cooperative since her arrival to ED.

## 2018-04-01 NOTE — Unmapped (Signed)
Pt dressed into gowns and red non slip socks.  Urine sample obtained and sent to lab for processing. Pt wanded by security [place] with this Clinical research associate present; no contraband found at this time. Pt states understanding of process and denies any needs at this time. Pt relaxing in recliner at this time.  One patient belongings sent to pt closet and placed on middle shelf.

## 2018-04-01 NOTE — Unmapped (Signed)
Problem: Adult Behavioral Health Plan of Care  Goal: Plan of Care Review  Outcome: Ongoing - Unchanged  Goal: Patient-Specific Goal (Individualization)  Outcome: Ongoing - Unchanged  Goal: Adheres to Safety Considerations for Self and Others  Outcome: Ongoing - Unchanged  Goal: Optimized Coping Skills in Response to Life Stressors  Outcome: Ongoing - Unchanged  Goal: Develops/Participates in Therapeutic Alliance to Support Successful Transition  Outcome: Ongoing - Unchanged  Goal: Rounds/Family Conference  Outcome: Ongoing - Unchanged     Problem: Self-Care Deficit  Goal: Improved Ability to Complete Activities of Daily Living  Outcome: Ongoing - Unchanged     Problem: Fall Injury Risk  Goal: Absence of Fall and Fall-Related Injury  Outcome: Ongoing - Unchanged     Problem: Skin Injury Risk Increased  Goal: Skin Health and Integrity  Outcome: Ongoing - Unchanged     Problem: Diabetes Comorbidity  Goal: Blood Glucose Level Within Desired Range  Outcome: Ongoing - Unchanged     Problem: Thought Process Alteration  Goal: Optimal Thought Clarity  Outcome: Ongoing - Unchanged   Patient admitted to unit today, alert and verbal. Patient able to communicate needs. Patient was brought onto unit in a wheelchair. Patient bright, pleasant and cooperative with care. Patient is a poor historian and became upset as more questions were asked. Patient currently denies SI/HI/AH/VH. Patient will work with treatment team towards care plan goals. Patient will continue to be monitored for safety, unpredictable behavior and falls.

## 2018-04-01 NOTE — Unmapped (Signed)
Pt sleeping in recliner, covered with blanket.  Pt has been calm and cooperative since her arrival to ED.

## 2018-04-01 NOTE — Unmapped (Signed)
Pt states she was having auditory hallucinations. Pt has hx of bipola and schizophrenia. States she threatened many pts at her group home. Pt states voices told her to kill herself and other pts. Pt was destructive at the group home. When asked if she feels suicidal or homicidal, pt states she just needs the help with the voices because that's what they're telling me.

## 2018-04-01 NOTE — Unmapped (Signed)
Per Tresa Endo, pt is to be discharged back to group home.  No ride available until morning.

## 2018-04-02 LAB — LIPID PANEL
CHOLESTEROL/HDL RATIO SCREEN: 3.6 (ref <5.0)
CHOLESTEROL: 115 mg/dL (ref <=200)
LDL CHOLESTEROL CALCULATED: 65 mg/dL (ref 0–100)
NON-HDL CHOLESTEROL: 83 mg/dL (ref 70–130)
TRIGLYCERIDES: 92 mg/dL (ref <150)
VLDL CHOLESTEROL CAL: 18.4 mg/dL (ref 11–40)

## 2018-04-02 LAB — HEMOGLOBIN A1C: ESTIMATED AVERAGE GLUCOSE: 114 mg/dL

## 2018-04-02 NOTE — Unmapped (Signed)
Pt encouraged to continue utilization of positive coping skills and leisure interests to promote/enhance psychosocial well-being.

## 2018-04-02 NOTE — Unmapped (Signed)
Hi,     Peggye Fothergill with [employer] contacted the Communication Center requesting to speak with the care team of Kimberly Mathis to discuss:     Leta Jungling has questions regarding patient's treatment plan. Leta Jungling says patient was recently assigned to him and he is now her legal guardian.    Please contact Jake at [phone].    Check Indicates criteria has been reviewed and confirmed with the patient:    [x]  Preferred Name   [x]  DOB and/or MR#  [x]  Preferred Contact Method  [x]  Phone Number(s)   []  MyChart     Thank you,   Laverna Peace  [facility]   [phone]

## 2018-04-02 NOTE — Unmapped (Signed)
Problem: Self-Care Deficit  Goal: Improved Ability to Complete Activities of Daily Living  Outcome: Progressing   Patient has been getting up to the bathroom w/o assistance of staff. Patient has a wheelchair in her room that she has been using like a walker. This wheelchair was removed and replaced with a front wheel walker. Patient has not come out of her room but has been going to the bathroom back to bed.

## 2018-04-02 NOTE — Unmapped (Signed)
Problem: Adult Behavioral Health Plan of Care  Goal: Patient-Specific Goal (Individualization)  Flowsheets (Taken 04/02/2018 1037)  Patient Personal Strengths:   motivated for treatment   expressive of needs   family/social support  Patient Vulnerabilities: Persistent mental illness, impaired insight, judgement, and impulse control.  Anxieties, Fears or Concerns: Pt is anxious about receiving help for her mental illness. Pt has concerns about returning to group home.  Individualized Care Needs: 1:1 to engage in meaningful goal-directed conversation free of psychosis, arrange aftercare and follow up appts, encourage med and tx compliance, initiate and maintain contact with family/guardian/placement  Patient-Specific Goals (Include Timeframe): Deny auditory hallucinations & convey feeling calm & safe to staff  Note: Emberli Pimenta is a 53 y.o., Black or African American race, Not Hispanic or Latino ethnicity,  ENGLISH speaking, single female with a history of Schizoaffective disorder bipolar type, CML (chronic myelocytic leukemia), Type II diabetes mellitus, HTN, Gout, GERD, Morbid obesity, and Osteoporosis who presents for evaluation of worsening psychosis and aggressive behavior. Pt was tangential, labile, and disorganized with flight of ideas during assessment but was also cooperative/ pleasant at times and able to answer some questions. Pt also drooled while speaking. Pt made bizarre facial expressions throughout the assessment, often staring intensely at sw, and her affect would change from sad to flat to elated often. Pt reports that she came to the hospital, because I went crazy. Pt stated, I'm here for help, I'm trying to reach my goals and be better. Pt then stated, I don't like to talk over and over. I went crazy, I love myself, I'm better than that. Pt repeated, Please help me several times. Pt made several disorganized statements such as, I peed 4 times and shitted 3 times last night. Pt complained of some leg/ knee pain and asked about having her own clothes. Pt also stated that she doesn't feel safe at the house, would not elaborate on this. Pt reports multiple past inpatient psychiatric admissions including [facility], [facility], [facility], [place], [facility], and [facility]. Pt reports that many of her hospitalizations were in [place] and the surrounding area. Pt was most recently at [facility] for 2.5 years, was released in June 2019 to [facility] group home. Pt reports that many of her past hospital admissions were due to [facility] and SI. Pt reports that her first hospitalization was at age 53. Pt reports that she is diagnosed with bipolar disorder and schizophrenia. Pt reports multiple past suicide attempts, was not able to recall details at present. Pt reports that she has had LAI's in the past including Haldol, Lithium, and Risperdal. Pt denies hx of SIB. Pt denies SI/HI/AVH at present.     Problem: Adult Behavioral Health Plan of Care  Goal: Adheres to Safety Considerations for Self and Others  Outcome: Progressing  Goal: Optimized Coping Skills in Response to Life Stressors  Outcome: Progressing  Goal: Develops/Participates in Therapeutic Alliance to Support Successful Transition  Outcome: Progressing  Goal: Rounds/Family Conference  Outcome: Progressing

## 2018-04-02 NOTE — Unmapped (Signed)
Social Work  Psychosocial Assessment     Service Date: April 02, 2018  Subjective:   Chief Complaint/History of Present Illness:   Kimberly Mathis is a 53 y.o., Black or African American race, Not Hispanic or Latino ethnicity,  ENGLISH speaking, single female with a history of Schizoaffective disorder bipolar type, CML (chronic myelocytic leukemia), Type II diabetes mellitus, HTN, Gout, GERD, Morbid obesity, and Osteoporosis who presents for evaluation of worsening psychosis and aggressive behavior. Pt was tangential, labile, and disorganized with flight of ideas during assessment but was also cooperative/ pleasant at times and able to answer some questions. Pt also drooled while speaking. Pt made bizarre facial expressions throughout the assessment, often staring intensely at sw, and her affect would change from sad to flat to elated often. Pt reports that she came to the hospital, because I went crazy. Pt stated, I'm here for help, I'm trying to reach my goals and be better. Pt then stated, I don't like to talk over and over. I went crazy, I love myself, I'm better than that. Pt repeated, Please help me several times. Pt made several disorganized statements such as, I peed 4 times and shitted 3 times last night. Pt complained of some leg/ knee pain and asked about having her own clothes. Pt also stated that she doesn't feel safe at the house, would not elaborate on this. Pt reports multiple past inpatient psychiatric admissions including [facility], [facility], [facility], [place], [facility], and [facility]. Pt reports that many of her hospitalizations were in [place] and the surrounding area. Pt was most recently at [facility] for 2.5 years, was released in June 2019 to [facility]. Pt reports that many of her past hospital admissions were due to [facility] and SI. Pt reports that her first hospitalization was at age 67. Pt reports that she is diagnosed with bipolar disorder and schizophrenia. Pt reports multiple past suicide attempts, was not able to recall details at present. Pt reports that she has had LAI's in the past including Haldol, Lithium, and Risperdal. Pt denies hx of SIB. Pt denies SI/HI/AVH at present.    Referred by: Care Manager, Self-referral    Reason for Referral: Cognitive / Behavioral / Psych Concerns  Psychosocial Stressors: Feelings of hopelessness about future and/or goals, Comment(Conflict in group home)       Custody: VOL  LEO agency on arrival: None  Collateral Information: Sw called group home manager June Obure [phone], who reports that she got a call from the caregiver at 8:30pm last night due to pt being disruptive in the gh and trying to break her walker. She reports that she could observe pt through video camera in main part of home and she saw pt throw a plastic plant at the caregiver, and pt tried to flip over one of the tables. She reports that she was able to speak to pt on the phone and pt told her that she was hearing voices and stated, I might hurt someone, I need some help. She reports that when she asked pt if she could go to her room and try to calm down pt stated, I think I'll wake up at night and do something, I don't know what I'll do. She reports that they called 911 and that when police arrived pt was still agitated, that they called EMS at this point and pt was brought to the hospital. She reports that pt does get agitated when she runs out of cigarettes  and that she had run out earlier. She reports that pt has not had AH/ SI or aggressive behavior since coming to their gh in June 2019, reports that pt was in [facility] for 2.5 years prior to coming to their gh. She reports that she learned from ED that pt has a UTI and is wondering how this may have affected her. She reports that pt takes Gleevec for Myelocytic lukemia. She reports that pt is welcomed to return there once she is stabilized.          Social History: Family of origin (born & raised; raised by; relationships with caregivers; siblings/sex/ages; relationship with siblings): Pt reports that she was born and raised in [place], [place]. Pt was raised by her mother and father, has 3 brothers, 2 sisters, and 2 step-sisters, pt is the youngest. Pt reports that she is not in contact with her parents, reports that her siblings visit her sometimes. Pt describes her childhood as wrong.   Current living arrangements, marital status, children: Pt currently resides at [facility] group home. Pt reports that she has never been married, and that she has 12 children, reports that she sees 3 of them sometimes, did not elaborate on this.   Education: Pt graduated high school   Employment history, current income, financial resources: Pt reports that she has worked at [employer] and [employer] in the past, but that she hasn't worked since 1988.  Military history:Military Service: No military service  Trauma (include childhood trauma) history: Pt denies hx of abuse.  Legal status (guardianship): YES   If YES, was copy provided _No___    Advance Directives: Patient have an advance directive?   No   If YES, was copy provided ______  If NO does patient request additional information on advanced directives:   No       (If YES consult to be ordered from spiritual care department)    Transportation: provided by group home  Support system:   Support Systems: Parent    Sexual Orientation: Bi-sexual    Religion/Spiritual Issues and Ethnic/Cultural Issues:  Cultural/Spiritual Beliefs/Practices Affecting Healthcare: None    Legal History:  Legal: No legal issues   Pt reports past drug and trespassing charges    Psychiatric History  Psychological Issues/Information: Mental illness     Current and past-Inpatient/outpatient Treatment (Dates and location): Pt reports multiple past inpatient psychiatric admissions including [facility], [facility], [facility], [place], [facility], and [facility]. Pt reports that many of her hospitalizations were in [place] and the surrounding area. Pt was most recently at [facility] for 2.5 years, was released in June 2019 to [facility] home. Pt reports that many of her past hospital admissions were due to [facility] and SI. Pt reports that her first hospitalization was at age 89. Pt reports that she is diagnosed with bipolar disorder and schizophrenia. Pt reports multiple past suicide attempts, was not able to recall details at present. Pt reports that she has had LAI's in the past including Haldol, Lithium, and Risperdal. Pt denies hx of SIB.     Family History (mental illness, substance abuse, and history of suicide): UTA  The patient's family history is not on file..    Substance Abuse     reports previous alcohol use.     reports previous drug use.     reports that she has quit smoking. Her smoking use included cigarettes. She smoked 1.00 pack per day. She has never used smokeless tobacco.  Substance Abuse   Any Substance Use?: Denies  Currently Experiencing Active Withdrawal?: Denies  History of Withdrawal Symptoms: Denies  Detox/Treatment history?: Denies  Blood Alcohol Concentration (Breathalyzer): (<20)      Chemical Dependency: None    Substance Abuse History   Substance Yes/No Age 1st Use Route of Use Frequency Amount of Use Use in last 12 Months   Marijuana     UTA        Alcohol     UTA        Inhalants (gas, glue)   UTA        Stimulants (spend, crystal meth, ADHD meds) UTA        Depressants (barbs, tranqs., benzos) UTA        Hallucinogens (LSD, acid, mushrooms, Ecstasy) UTA        Narcotics (heroin, pain pills)   UTA        Cocaine/Crack     Yes UTA       Tobacco     (use specifically over past 30 days) Yes     Current       Withdrawal symptoms (diarrhea, nausea, vomiting, tremors, shakes, hallucinations, black outs and seizures): Pt denies  Consequences of use/abuse: Pt denies    Medical History:  Past Medical History: Diagnosis Date   ??? GERD (gastroesophageal reflux disease)    ??? Gout    ??? HTN (hypertension)    ??? Morbid obesity (CMS-HCC)    ??? Osteoporosis    ??? Schizoaffective disorder, bipolar type (CMS-HCC)    ??? Type II diabetes mellitus (CMS-HCC)        Chemical Dependency: None    Surgical History:  No past surgical history on file.     Mental Status Exam:  Appearance:    Appears stated age, Well nourished and Disheveled   Behavior:   Cooperative and Direct eye contact   Motor:   Rolled eyes up and around   Speech/Language:    Pressured   Mood:   Depressed and Anxious   Affect:   Anxious, Cooperative, Depressed, Expansive and Labile   Thought process:   Disorganized, Flight of ideas, Tangential and Thought blocking   Thought content:     Denies SI, HI, self harm, delusions, obsessions, paranoid ideation, or ideas of reference   Perceptual disturbances:     Pt denies AH at presnt but appeared to have thought blocking     Orientation:   UTA   Attention:   Able to fully attend without fluctuations in consciousness   Concentration:   Distractible   Memory:   Some impairment    Fund of knowledge:    Consistent with level of education and development   Insight:     Impaired   Judgment:    Impaired   Impulse Control:   Impaired             Client Strengths (at least 2)  : Pt was cooperative and asking for help/ willing to engage in treatment.    Obstacles:  Persistent mental illness, impaired insight, judgement, and impulse control.    Readiness for Change: Pt is willing to engage in treatment.    Clinical Impression                                           Interventions Provided During the Assessment: Sw completed psychosocial assessment, provided  psychoeducation and support. Sw discussed aftercare planning and will arrange appropriate aftercare appointments.     Initial Treatment /Discharge Plan  Ability to [employer]: No issues accessing community services  Aftercare plan (see AVS): Medication management, appointment needed.  Community resources: Therapy, through group home.  Placement: Group home.  Family involvements: Mother is supportive.  Medical services: PCP            Currie Paris

## 2018-04-02 NOTE — Unmapped (Addendum)
Recreational Therapy Evaluation  04/02/2018     Patient Name:  Kimberly Mathis       Medical Record Number: [id number]   Date of Birth: 12/02/1965  Sex: Female          Room/Bed:  645/645-01    Eval Duration: 10 Min.    Assessment  Pt was admitted evaluation of auditory hallucination and homicidal ideation. Pt initially presents irritable, but mellows with a bright affect and congruent mood. Pt reports the reason for this hospitalization is I went crazy. Pt denies current SI/HI/AH/VH. Pt is A&O x's 2. Pt could not identify any stressors and stated, I don't know. Pt identified a positive support system that includes her mama. Pt reports having no anxiety. Pt reports pain in her stomach and head but could not rate it on a scale of 1-10. Pt identified her leisure interests as listening to music, singing and rapping. Pt could not identify any coping strategies and stated, I don't know. Pt is motivated to learn new coping strategies. Pt identified her Tx goal as I need help physically.  Pt was educated on Recreational Therapy services and encouraged to attend groups.    Plan of Care  3x per day for: 5-7 days per week   Planned Treatment Duration Throughout admission.     Patient's Identified Treatment Goal: I need help physically.  Patient's Stressors / Triggers: None identified.  Treatment Plan developed in collaboration with: Patient        Goals:  1. By 04/09/18, patient will attend 3 recreational therapy treatment sessions for at least 25 minutes each with less than 2 cues, demonstrating improved motivation to RT treatment.   2. Within 7 RT leisure sessions, patient will demonstrate 3 leisure interests that can used as positive coping skills, demonstrating improved insight into managing stressors and symptoms related to current mental illness.     3. Prior to discharge, patient will state at least 3 different coping strategies that patient can use to manage mental health symptoms, demonstrating improved likelihood to implement learned skills post discharge.          Subjective    Current Situation: Pt was admitted evaluation of auditory hallucination and homicidal ideation.   Cognitive, Emotional, Physical, Social, and Leisure/Life functioning were assessed:: Patient Interviews;Review of Chart;Treatment Team  Living Environment: Group home  Lives With: Care Staff  Risk Factors: None  Precautions: Psych Safety precautions  Equipment / Environment: None  Reports/displays signs/symptoms of pain?: Yes  Pain Comments : stomach and head  Add'l Session Information: No family/caregiver present    Past Medical History:   Diagnosis Date   ??? GERD (gastroesophageal reflux disease)    ??? Gout    ??? HTN (hypertension)    ??? Morbid obesity (CMS-HCC)    ??? Osteoporosis    ??? Schizoaffective disorder, bipolar type (CMS-HCC)    ??? Type II diabetes mellitus (CMS-HCC)     Social History     Tobacco Use   ??? Smoking status: Former Smoker     Packs/day: 1.00     Types: Cigarettes   ??? Smokeless tobacco: Never Used   Substance Use Topics   ??? Alcohol use: Not Currently      No past surgical history on file. History reviewed. No pertinent family history.     Allergies: Patient has no known allergies.     Objective    Cognitive  Stage of change / level of insight: Pre-contemplation  Thought Process/Content: Disorganized;Thought blocking  Judgment: Impaired  Memory: Independent with recall  Follows Directions: Able to follow directions independently  Attention Span/Alertness : Able to attend to RT assessment  Problem-Solving: Requires more than a reasonable amount of time to solve problems  Medical understanding: Would benefit from additional dx/tx education'  Orientation: Oriented to person;Oriented to situation    Communication  Communication Barriers: None noted    Emotional  Mood: Cheerful  Affect: Bright  Anxiety Management : Reports no anxiety  Pain Management : Reports pain that is situational  Coping Skills : Reports no effective coping strategies and/or unhealthy coping strategies  Motivated to learn new coping strategies?: Yes  Adjustment: Not acknowledging need to improve adjustment  Emotional Expression: Independently expresses feelings    Physical Domain  Vision: No visual deficits  Mobility Comments: Pt ambulates using a walker.  Exercise Readiness: Yes  Equipment/Device needs: rolling walker    Social  Support system: Reports positive support system(mother)  Assertiveness: Independent with assertiveness  Patient Behaviors and Interactions: Isolated  Ability to form relationship / interact with others: Isolated to room     Leisure and Life Function  Level of involvement: Frequent participation  Community reintegration / barrier education: Requires mod cues to safely problem solve community barriers.  Adaptability for Leisure and life functioning: Independently investigates adaptations;Requires min cues to investigate adaptations  Motivation to engage in leisure / play: Yes  Quality of participation: Satisfactory involvement in healthy leisure pursuits      I attest that I have reviewed the above information.  Signed by Jenita Seashore  Filed 04/02/2018

## 2018-04-02 NOTE — Unmapped (Signed)
Late entry from 04/01/2018:     This patient is on Clozapine.  An ANC of 6600 was reported on 03/31/2018.  ANC reported to registry on 04/01/2018 by [place], [facility].  Registry reports that lab is saved.    Jenniefer Salak C. Taijon Vink, RPH

## 2018-04-02 NOTE — Unmapped (Signed)
Problem: Adult Behavioral Health Plan of Care  Goal: Plan of Care Review  Outcome: Progressing  Goal: Patient-Specific Goal (Individualization)  Outcome: Progressing  Goal: Adheres to Safety Considerations for Self and Others  Outcome: Progressing  Goal: Optimized Coping Skills in Response to Life Stressors  Outcome: Progressing  Goal: Develops/Participates in Therapeutic Alliance to Support Successful Transition  Outcome: Progressing  Goal: Rounds/Family Conference  Outcome: Progressing     Problem: Self-Care Deficit  Goal: Improved Ability to Complete Activities of Daily Living  Outcome: Progressing     Problem: Fall Injury Risk  Goal: Absence of Fall and Fall-Related Injury  Outcome: Progressing     Problem: Skin Injury Risk Increased  Goal: Skin Health and Integrity  Outcome: Progressing     Problem: Diabetes Comorbidity  Goal: Blood Glucose Level Within Desired Range  Outcome: Progressing     Problem: Thought Process Alteration  Goal: Optimal Thought Clarity  Outcome: Progressing   Patient displays an appropriate affect and mood. Patient denies SI/HI and AVH. Patient is observed in the milieu, no peer interaction. Patient is compliant with medications and is encouraged to attend groups. Patient continues on antibiotic therapy for a UTI. Patient has complained of loose stools and provider is aware. Patient is ambulating independently with rolling walker. Q 15 minute safety checks continue.

## 2018-04-02 NOTE — Unmapped (Signed)
[facility]  Psychiatry      Daily Progress Note      Admit date/time: 03/31/2018   LOS: 1    Diagnosis & Problems:  Principal Problem:    Schizoaffective disorder, bipolar type (CMS-HCC)  Active Problems:    Type II diabetes mellitus (CMS-HCC)        Subjective:   Patient's case discussed during treatment team. Patient was assessed in her bedroom for follow up care. She was seen by PT today who recommended a RW which she uses at the group home. In assessment she is noted to be calm and cooperative. Her speech is much quieter today than the previous day but mood is still elevated. She is focused on her bowel movements during assessment and stated yesterday she began experiencing diarrhea. She slept well however woke multiple times throughout the night to use the restroom. She asks provider multiple times if she can flush her toilet. She then goes on to talk about a headache and her thought process is quite tangential. She reported she came to the hospital because she went crazy and can do better.     Relevant PRNs administered: none    Mental Status Exam:  Appearance:  ??  Disheveled   Motor: ??  Using walker   Speech/Language:  ??  Loud and Pressured   Mood: ??  Calmer, cooperative   Affect: ??  Labile    Thought process: ?? Tangential   Thought content:   ?? Endorses Homicidal Ideation toward [facility] staff   Perceptual disturbances:   ??  Auditory, voices telling her to harm [facility] staff or they will kill her  ??   Orientation: ??  Oriented to person, place, time, and general circumstances   Attention: ??  Poor Attention Span   Concentration: ??  Easily Distractible   Memory: ??  Intact to interview    Fund of knowledge:  ??  Consistent with level of education and development   Insight:   ??  Impaired   Judgment:  ??  Impaired   Impulse Control: ??  Impaired     Vitals:  Vitals:    04/02/18 0746   BP: 123/77   Pulse: 93   Resp: 16   Temp: 36.3 ??C (97.3 ??F)   SpO2: 98%       Medications:        Current Facility-Administered Medications:   ???  acetaminophen (TYLENOL) tablet 650 mg, 650 mg, Oral, Q6H PRN, Radene Knee, MD  ???  allopurinoL (ZYLOPRIM) tablet 100 mg, 100 mg, Oral, Daily, Radene Knee, MD, 100 mg at 04/02/18 0944  ???  cloZAPine (CLOZARIL) tablet 150 mg, 150 mg, Oral, Daily, Radene Knee, MD, 150 mg at 04/01/18 1724  ???  clozapine (CLOZARIL) tablet 300 mg, 300 mg, Oral, Nightly, Radene Knee, MD, 300 mg at 04/01/18 2209  ???  divalproex (DEPAKOTE) DR tablet 500 mg, 500 mg, Oral, Daily, Radene Knee, MD, 500 mg at 04/02/18 1610  ???  divalproex (DEPAKOTE) DR tablet 750 mg, 750 mg, Oral, Daily, Radene Knee, MD, 750 mg at 04/01/18 1724  ???  docusate sodium (COLACE) capsule 100 mg, 100 mg, Oral, Daily, Radene Knee, MD, 100 mg at 04/02/18 0944  ???  famotidine (PEPCID) tablet 10 mg, 10 mg, Oral, BID, Radene Knee, MD, 10 mg at 04/02/18 9604  ???  folic acid (FOLVITE) tablet 1 mg, 1 mg, Oral, Daily, Radene Knee, MD, 1 mg at 04/02/18  29  ???  glycopyrrolate (ROBINUL) tablet 1 mg, 1 mg, Oral, BID, Radene Knee, MD, 1 mg at 04/02/18 0945  ???  haloperidoL (HALDOL) tablet 5 mg, 5 mg, Oral, Q6H PRN, Radene Knee, MD  ???  imatinib (GLEEVEC) tablet 400 mg, 400 mg, Oral, Daily, Radene Knee, MD  ???  influenza vaccine quad (FLUARIX, FLULAVAL, FLUZONE) (6 MOS & UP) 2019-20, 0.5 mL, Intramuscular, During hospitalization, Radene Knee, MD  ???  lisinopriL (PRINIVIL,ZESTRIL) tablet 2.5 mg, 2.5 mg, Oral, Daily, Radene Knee, MD, 2.5 mg at 04/02/18 0944  ???  LORazepam (ATIVAN) tablet 2 mg, 2 mg, Oral, Q6H PRN, Radene Knee, MD  ???  metFORMIN (GLUCOPHAGE) tablet 1,000 mg, 1,000 mg, Oral, Daily, Radene Knee, MD, 1,000 mg at 04/02/18 0944  ???  metoprolol succinate (TOPROL-XL) 24 hr tablet 100 mg, 100 mg, Oral, Daily, Radene Knee, MD, 100 mg at 04/02/18 0944  ???  multivitamin with folic acid 400 mcg tablet 1 tablet, 1 tablet, Oral, Daily, Radene Knee, MD, 1 tablet at 04/02/18 0944  ???  nitrofurantoin (macrocrystal-monohydrate) (MACROBID) 100 MG capsule 100 mg, 100 mg, Oral, Q12H SCH, Radene Knee, MD, 100 mg at 04/02/18 0944  ???  pneumococcal polysacchride (23-valps) (PNEUMOVAX) vaccine 0.5 mL, 0.5 mL, Subcutaneous, During hospitalization, Radene Knee, MD  ???  pravastatin (PRAVACHOL) tablet 10 mg, 10 mg, Oral, Daily, Radene Knee, MD, 10 mg at 04/02/18 0944  ???  pyridoxine (vitamin B6) (B-6) tablet 200 mg, 200 mg, Oral, Daily, Radene Knee, MD, 200 mg at 04/02/18 0945  ???  senna (SENOKOT) tablet 2 tablet, 2 tablet, Oral, Nightly, Radene Knee, MD, 2 tablet at 04/01/18 2206  ???  traZODone (DESYREL) tablet 50 mg, 50 mg, Oral, Nightly PRN, Radene Knee, MD  ???  zinc sulfate (ZINCATE) capsule 220 mg, 220 mg, Oral, Daily, Radene Knee, MD, 220 mg at 04/02/18 0945      Assessment & Plan:    Schizoaffective d/o  - Clozapine 150mg  daily and 300mg  nightly - cbc w/diff done 3/29, add on pending clozapine level and metabolites  - Depakote 500mg  daily and 750mg  nightly - VPA on admission 53.4  - Colace and senna  - Robinul BID    Diabetes  - Metformin    HTN  Metoprolol  Lisinopril    Chronic myelocytic leukemia  - Imatinib 400mg  daily    UTI  - Continue macrobid until 4/4  - c/o urinary frequency  - diarrhea r/t antibiotic - PRN imodium    Crisis stabilization and d/c planning  - Follows up with Dr. Mayford Knife at [facility]  - Will consider a [facility] referral  - Is able to return to previous group home    Primitivo Gauze, PMHNP    12:17 PM

## 2018-04-02 NOTE — Unmapped (Signed)
R/c to [facility] and l/m requesting he leave a time available to call, and advising I will f/u with him tomorrow if I don't hear back from him today.

## 2018-04-03 LAB — CLOZAPINE LEVEL AND METABOLITE: TOTAL(CLOZ+NORCLOZ): 176 ng/mL

## 2018-04-03 NOTE — Unmapped (Signed)
Problem: Adult Behavioral Health Plan of Care  Goal: Plan of Care Review  Outcome: Progressing  Goal: Patient-Specific Goal (Individualization)  Outcome: Progressing  Goal: Adheres to Safety Considerations for Self and Others  Outcome: Progressing  Goal: Optimized Coping Skills in Response to Life Stressors  Outcome: Progressing  Goal: Develops/Participates in Therapeutic Alliance to Support Successful Transition  Outcome: Progressing  Goal: Rounds/Family Conference  Outcome: Progressing     Problem: Self-Care Deficit  Goal: Improved Ability to Complete Activities of Daily Living  Outcome: Progressing     Problem: Fall Injury Risk  Goal: Absence of Fall and Fall-Related Injury  Outcome: Progressing     Problem: Skin Injury Risk Increased  Goal: Skin Health and Integrity  Outcome: Progressing     Problem: Diabetes Comorbidity  Goal: Blood Glucose Level Within Desired Range  Outcome: Progressing     Problem: Thought Process Alteration  Goal: Optimal Thought Clarity  Outcome: Progressing     Pt presents awake, disoriented to place, time, and situation. Pt c/o increased frequency and urgency urination. Pt reports mood as anxious d/t UTI. Pt states poor stability and balance cause her to experience urinary incontinence, educated on use of call button in room and to request for assistance if needed. Pt verbalized understanding. Pt denies SI/HI; denies AH/VH. Pt refused to shower. Pt remains on q checks for safety.

## 2018-04-03 NOTE — Unmapped (Signed)
Recreational Therapy Treatment  04/03/2018     Patient Name:  Kimberly Mathis       Medical Record Number: [id number]   Date of Birth: 01/01/1966  Sex: Female          Room/Bed:  645/645-01    Tx Duration: 0 Min.   Missed Treatment Reason: Patient unwilling to participate    Assessment  Note Type: Contact Note  Contact Note: Pt did not attend recreational therapy group based on identifying positive coping skills.        Plan of Care  3x per day for: 5-7 days per week   Planned Treatment Duration Throughout admission.     Goals:  Goal 1: 1. By 04/09/18, patient will attend 3 recreational therapy treatment sessions for at least 25 minutes each with less than 2 cues, demonstrating improved motivation to RT treatment.  Progress: 0/3 attended  Plan: In Progress  Goal 2: 2. Within 7 RT leisure sessions, patient will demonstrate 3 leisure interests that can used as positive coping skills, demonstrating improved insight into managing stressors and symptoms related to current mental illness.    Progress: 0/3 demonstrated  Plan: In Progress  Goal 3: 3. Prior to discharge, patient will state at least 3 different coping strategies that patient can use to manage mental health symptoms, demonstrating improved likelihood to implement learned skills post discharge.  Progress: 0/3 stated  Plan: In Progress    I attest that I have reviewed the above information.  Signed by Allena Katz LRT/CTRS  Filed 04/03/2018

## 2018-04-03 NOTE — Unmapped (Signed)
Mr. Patricia Nettle asks that I call him back at 68 today.

## 2018-04-03 NOTE — Unmapped (Signed)
SW met with Pt on this date. Pt presents with Bright affect and reports that she is feeling better. She does report that she couldn't sleep last night because she was up and down the whole night going to the bathroom. She denies SI/HI/AH/VH at present. Pt began laughing hysterically during assessment at times and was not able to describe what she was finding so funny.    SW spoke with [name]/Jake [phone] who shares concerns about Pt being in the hospital due to Covid19. He reports he is worried that Pt may be at higher risk due to her cancer diagnosis.

## 2018-04-03 NOTE — Unmapped (Signed)
Problem: Adult Behavioral Health Plan of Care  Goal: Plan of Care Review  Outcome: Progressing  Goal: Patient-Specific Goal (Individualization)  Outcome: Progressing  Goal: Adheres to Safety Considerations for Self and Others  Outcome: Progressing  Goal: Optimized Coping Skills in Response to Life Stressors  Outcome: Progressing  Goal: Develops/Participates in Therapeutic Alliance to Support Successful Transition  Outcome: Progressing  Goal: Rounds/Family Conference  Outcome: Progressing     Problem: Self-Care Deficit  Goal: Improved Ability to Complete Activities of Daily Living  Outcome: Progressing     Problem: Fall Injury Risk  Goal: Absence of Fall and Fall-Related Injury  Outcome: Progressing     Problem: Skin Injury Risk Increased  Goal: Skin Health and Integrity  Outcome: Progressing     Problem: Diabetes Comorbidity  Goal: Blood Glucose Level Within Desired Range  Outcome: Progressing     Problem: Thought Process Alteration  Goal: Optimal Thought Clarity  Outcome: Progressing   Patient alert and verbal, able to communicate needs appropriately. Patient out on milieu interacting with peers. Patient bright and elevated. Patient ambulates around unit using a walker. Patient worked with PT this morning. Patient pleasant and cooperative with care. Patient continues to endorse AH but denies SI/HI and VH. Patient will continue to work with treatment towards care plan goals. Patient will continue to be monitored Q 15 minutes for safety, unpredictable behavior and falls.

## 2018-04-03 NOTE — Unmapped (Signed)
[facility]  Psychiatry      Daily Progress Note      Admit date/time: 03/31/2018   LOS: 2    Diagnosis & Problems:  Principal Problem:    Schizoaffective disorder, bipolar type (CMS-HCC)  Active Problems:    Type II diabetes mellitus (CMS-HCC)    Subjective:   Patient's case discussed during treatment team. Patient assessed in her bedroom for follow up care. During conversation she asks if I can remove my mask so she can see my nose to see if Im the provider she saw yesterday. She then laughs and stated oh it is you. She complains of feeling tired today and had another episode of diarrhea after lunch. She has not taken any PRN Imodium but discussed with the medication nurse who was going to bring it to her. She denied any nausea or vomiting. She is denying any AVH today. She remains intermittently elevated and disorganized in conversation.     I spoke to her guardian today through the [facility] @ [phone]; we discussed patients treatment regimen, subtherapeutic VPA and medication regimen. He planned on discussing with the committee and returning providers call. He reported the increase in Depakote would be fine but he would bring up with committee.     Relevant PRNs administered: none    Mental Status Exam:  Appearance:?? ?? ??Disheveled   Motor: ?? ??Using walker   Speech/Language:?? ?? ??Loud and Pressured   Mood: ?? ??Calmer, cooperative   Affect: ?? ??Labile    Thought process: ?? Tangential   Thought content:???? ?? Denied any SI/HI today   Perceptual disturbances:???? ?? ??Denied any AVH  ??   Orientation: ?? ??Oriented to person, place, time, and general circumstances   Attention: ?? ??Poor Attention Span   Concentration: ?? ??Easily Distractible   Memory: ?? ??Intact to interview   ??Fund of knowledge:  ?? ??Consistent with level of education and development   Insight:???? ?? ??Impaired   Judgment:?? ?? ??Impaired   Impulse Control: ?? ??Impaired     Vitals:  Vitals:    04/03/18 0752   BP: 145/108   Pulse: 102   Resp: 16 Temp: 36.5 ??C (97.7 ??F)   SpO2: 96%     Medications:        Current Facility-Administered Medications:   ???  acetaminophen (TYLENOL) tablet 650 mg, 650 mg, Oral, Q6H PRN, Radene Knee, MD  ???  allopurinoL (ZYLOPRIM) tablet 100 mg, 100 mg, Oral, Daily, Radene Knee, MD, 100 mg at 04/03/18 1308  ???  cloZAPine (CLOZARIL) tablet 150 mg, 150 mg, Oral, Daily, Primitivo Gauze, PMHNP, 150 mg at 04/03/18 6578  ???  clozapine (CLOZARIL) tablet 300 mg, 300 mg, Oral, Nightly, Radene Knee, MD, 300 mg at 04/02/18 2103  ???  divalproex (DEPAKOTE) DR tablet 500 mg, 500 mg, Oral, Daily, Radene Knee, MD, 500 mg at 04/03/18 0946  ???  divalproex (DEPAKOTE) DR tablet 750 mg, 750 mg, Oral, Nightly, Primitivo Gauze, PMHNP, 750 mg at 04/02/18 2103  ???  docusate sodium (COLACE) capsule 100 mg, 100 mg, Oral, Daily, Radene Knee, MD, Stopped at 04/03/18 [id number]  ???  famotidine (PEPCID) tablet 10 mg, 10 mg, Oral, BID, Radene Knee, MD, 10 mg at 04/03/18 0941  ???  folic acid (FOLVITE) tablet 1 mg, 1 mg, Oral, Daily, Radene Knee, MD, 1 mg at 04/03/18 0946  ???  glycopyrrolate (ROBINUL) tablet 1 mg, 1 mg, Oral, BID, Radene Knee, MD,  1 mg at 04/03/18 0945  ???  haloperidoL (HALDOL) tablet 5 mg, 5 mg, Oral, Q6H PRN, Radene Knee, MD  ???  imatinib (GLEEVEC) tablet 400 mg, 400 mg, Oral, Daily, Radene Knee, MD, 400 mg at 04/03/18 1610  ???  influenza vaccine quad (FLUARIX, FLULAVAL, FLUZONE) (6 MOS & UP) 2019-20, 0.5 mL, Intramuscular, During hospitalization, Radene Knee, MD  ???  lisinopriL (PRINIVIL,ZESTRIL) tablet 2.5 mg, 2.5 mg, Oral, Daily, Radene Knee, MD, 2.5 mg at 04/03/18 0944  ???  loperamide (IMODIUM) capsule 2 mg, 2 mg, Oral, TID PRN, Primitivo Gauze, PMHNP  ???  LORazepam (ATIVAN) tablet 2 mg, 2 mg, Oral, Q6H PRN, Radene Knee, MD  ???  metFORMIN (GLUCOPHAGE) tablet 1,000 mg, 1,000 mg, Oral, Daily, Radene Knee, MD, 1,000 mg at 04/03/18 0944  ???  metoprolol succinate (TOPROL-XL) 24 hr tablet 100 mg, 100 mg, Oral, Daily, Radene Knee, MD, 100 mg at 04/03/18 9604  ???  multivitamin with folic acid 400 mcg tablet 1 tablet, 1 tablet, Oral, Daily, Radene Knee, MD, 1 tablet at 04/03/18 0945  ???  nitrofurantoin (macrocrystal-monohydrate) (MACROBID) 100 MG capsule 100 mg, 100 mg, Oral, Q12H SCH, Radene Knee, MD, 100 mg at 04/03/18 0945  ???  pneumococcal polysacchride (23-valps) (PNEUMOVAX) vaccine 0.5 mL, 0.5 mL, Subcutaneous, During hospitalization, Radene Knee, MD  ???  pravastatin (PRAVACHOL) tablet 10 mg, 10 mg, Oral, Daily, Radene Knee, MD, 10 mg at 04/03/18 0945  ???  pyridoxine (vitamin B6) (B-6) tablet 200 mg, 200 mg, Oral, Daily, Radene Knee, MD, 200 mg at 04/03/18 5409  ???  senna (SENOKOT) tablet 2 tablet, 2 tablet, Oral, Nightly, Radene Knee, MD, 2 tablet at 04/02/18 2103  ???  traZODone (DESYREL) tablet 50 mg, 50 mg, Oral, Nightly PRN, Radene Knee, MD  ???  zinc sulfate (ZINCATE) capsule 220 mg, 220 mg, Oral, Daily, Radene Knee, MD, 220 mg at 04/03/18 8119      Assessment & Plan:    Schizoaffective d/o  - Clozapine 150mg  daily and 300mg  nightly - cbc w/diff done 3/29, add on pending clozapine level and metabolites  - Increase Depakote 500mg  daily and 1000mg  nightly - VPA on admission 53.4, re check VPA 4/5  - Colace and senna  - Robinul BID  - Metformin  ??  HTN  Metoprolol  Lisinopril  ??  Chronic myelocytic leukemia  - Imatinib 400mg  daily  ??  UTI  - Continue macrobid until 4/4 - re check UA  - c/o urinary frequency  - diarrhea r/t antibiotic - PRN imodium  ??  Crisis stabilization and d/c planning  - Follows up with Dr. Mayford Knife at [facility] [phone]  - Will consider a [facility] referral  - Is able to return to previous group home  - If continued progress and therapeutic VPA, tentative d/c plan for Monday      Primitivo Gauze, PMHNP    3:04 PM

## 2018-04-03 NOTE — Unmapped (Signed)
PHYSICAL THERAPY  Evaluation (04/03/18 0846)     Patient Name:  Kimberly Mathis       Medical Record Number: [id number]   Date of Birth: 1965-06-02  Sex: Female            Treatment Diagnosis: unsteady gait    ASSESSMENT        Assessment : safe to D/C to group home when psychiatrically stable     Today's Interventions: EV, EX (BLE AROMx10 seated in chair), TT, GT                          PLAN  Planned Frequency of Treatment:  D/C Services for: D/C Services      Planned Interventions: other(consult only)    Post-Discharge Physical Therapy Recommendations:  PT services not indicated    PT DME Recommendations: None           Goals:   Patient and Family Goals: return to group home                                                                                                        Prognosis:  Good     Barriers to Discharge: None    SUBJECTIVE  Patient reports: agreed to PT  Current Functional Status: seated on toilet before tx, seated in straight back chair in common room after tx     Prior Functional Status: indep w/RW  Equipment available at home: [employer]     Past Medical History:   Diagnosis Date   ??? GERD (gastroesophageal reflux disease)    ??? Gout    ??? HTN (hypertension)    ??? Morbid obesity (CMS-HCC)    ??? Osteoporosis    ??? Schizoaffective disorder, bipolar type (CMS-HCC)    ??? Type II diabetes mellitus (CMS-HCC)     Social History     Tobacco Use   ??? Smoking status: Former Smoker     Packs/day: 1.00     Types: Cigarettes   ??? Smokeless tobacco: Never Used   Substance Use Topics   ??? Alcohol use: Not Currently      No past surgical history on file. History reviewed. No pertinent family history.     Allergies: Patient has no known allergies.                Objective Findings  Precautions / Restrictions  Precautions: Psych Safety precautions  Weight Bearing Status: Non-applicable  Required Braces or Orthoses: Non-applicable    Communication Preference: Verbal;Visual   Pain Comments: 0/10     Equipment / Environment: None                  Living Situation  Living Environment: Group home  Lives With: Care Staff  Home Living: Level entry     Cognition: A&O                LE ROM: WFL for mob tested  LE Strength: WFL for mob tested  Bed Mobility: supine-sit not observed, reported indep  Transfers: sit-stand from toilet w/RW indep   Gait  Level of Assistance: Independent  Assistive Device: Front wheel walker  Distance Ambulated (ft): 100 ft  Gait: no LOB/SOB/dizziness         Endurance: good    Physical Therapy Session Duration  PT Individual - Duration: 25    Medical Staff Made Aware: nsg    I attest that I have reviewed the above information.  SignedRoe Rutherford, PT  Filed 04/03/2018

## 2018-04-03 NOTE — Unmapped (Signed)
Communicated with Mr. Patricia Nettle, pt's legal guardian, regarding her diagnosis of CML. Explained pt prognosis and treatment trajectory as currently documented and referred Mr. Patricia Nettle to [email or web address] website for indepth explanation of disease. Encouraged Mr. Patricia Nettle to reach out to [place] with any additional questions or concerns. Mr. Patricia Nettle verbalized understanding and expressed appreciation for the call.

## 2018-04-04 LAB — BASIC METABOLIC PANEL
ANION GAP: 10 mmol/L (ref 9–15)
BLOOD UREA NITROGEN: <5 mg/dL — ABNORMAL LOW (ref 9–23)
CALCIUM: 9.3 mg/dL (ref 8.3–10.6)
CHLORIDE: 106 mmol/L (ref 98–107)
CO2: 26.5 mmol/L (ref 20.0–31.0)
EGFR CKD-EPI AA FEMALE: >90 mL/min/{1.73_m2} (ref >=60)
EGFR CKD-EPI NON-AA FEMALE: >90 mL/min/{1.73_m2} (ref >=60)
GLUCOSE RANDOM: 96 mg/dL (ref 70–99)
GLUCOSE RANDOM: 96 mg/dL — ABNORMAL LOW (ref 70–99)
POTASSIUM: 3.7 mmol/L (ref 3.5–5.1)
SODIUM: 142 mmol/L (ref 136–145)

## 2018-04-04 LAB — CBC
HEMATOCRIT: 30.2 % — ABNORMAL LOW (ref 34.1–44.9)
MEAN CORPUSCULAR HEMOGLOBIN CONC: 33.1 g/dL (ref 32.2–35.5)
MEAN CORPUSCULAR HEMOGLOBIN: 30.7 pg (ref 25.7–32.2)
MEAN CORPUSCULAR VOLUME: 92.6 fL (ref 79.4–94.8)
MEAN CORPUSCULAR VOLUME: 92.6 fL — ABNORMAL LOW (ref 79.4–94.8)
MEAN PLATELET VOLUME: 9.8 fL (ref 9.4–12.4)
PLATELET COUNT: 289 10*9/L (ref 182–369)
RED BLOOD CELL COUNT: 3.26 10*12/L — ABNORMAL LOW (ref 3.93–5.22)
RED CELL DISTRIBUTION WIDTH: 14.6 % — ABNORMAL HIGH (ref 11.6–14.4)
WBC ADJUSTED: 10.3 10*9/L — ABNORMAL HIGH (ref 4.0–10.0)

## 2018-04-04 LAB — CLOZAPINE LEVEL AND METABOLITE: TOTAL(CLOZ+NORCLOZ): 176 ng/mL

## 2018-04-04 NOTE — Unmapped (Signed)
Recreational Therapy Treatment  04/03/2018     Patient Name:  Kimberly Mathis       Medical Record Number: [id number]   Date of Birth: 05/17/1965  Sex: Female          Room/Bed:  645/645-01    Tx Duration: 0 Min.   Missed Treatment Reason: Patient unwilling to participate    Assessment  Note Type: Contact Note  Contact Note: Pt did not attend recreational therapy group based on leisure interests.       Plan of Care  3x per day for: 5-7 days per week   Planned Treatment Duration Throughout admission.     Goals:  Goal 1: 1. By 04/09/18, patient will attend 3 recreational therapy treatment sessions for at least 25 minutes each with less than 2 cues, demonstrating improved motivation to RT treatment.  Progress: 0/3 attended  Plan: In Progress  Goal 2: 2. Within 7 RT leisure sessions, patient will demonstrate 3 leisure interests that can used as positive coping skills, demonstrating improved insight into managing stressors and symptoms related to current mental illness.    Progress: 0/3 demonstrated  Plan: In Progress  Goal 3: 3. Prior to discharge, patient will state at least 3 different coping strategies that patient can use to manage mental health symptoms, demonstrating improved likelihood to implement learned skills post discharge.  Progress: 0/3 stated  Plan: In Progress    I attest that I have reviewed the above information.  Signed by Allena Katz LRT/CTRS  Filed 04/03/2018

## 2018-04-04 NOTE — Unmapped (Deleted)
Problem: Adult Behavioral Health Plan of Care  Goal: Plan of Care Review  Outcome: Ongoing - Unchanged  Goal: Patient-Specific Goal (Individualization)  Outcome: Ongoing - Unchanged  Goal: Adheres to Safety Considerations for Self and Others  Outcome: Ongoing - Unchanged  Goal: Optimized Coping Skills in Response to Life Stressors  Outcome: Ongoing - Unchanged  Goal: Develops/Participates in Therapeutic Alliance to Support Successful Transition  Outcome: Ongoing - Unchanged  Goal: Rounds/Family Conference  Outcome: Ongoing - Unchanged     Problem: Self-Care Deficit  Goal: Improved Ability to Complete Activities of Daily Living  Outcome: Ongoing - Unchanged     Problem: Fall Injury Risk  Goal: Absence of Fall and Fall-Related Injury  Outcome: Ongoing - Unchanged     Patient is guarded, paranoid, flat, and isolative to her room. She just laughed and shook her head in disdain when I asked if was having SI/HI/AH/VH. She came out her room for VS and HS snack, but would to medication window for meds. When medication  Nurse took colace and cogentin to her room she refused them. Patient did not shower this shift. She ate 100% of her HS snack.

## 2018-04-04 NOTE — Unmapped (Signed)
[facility]  Psychiatry      Daily Progress Note      Admit date/time: 03/31/2018   LOS: 3    Diagnosis & Problems:  Principal Problem:    Schizoaffective disorder, bipolar type (CMS-HCC)  Active Problems:    Type II diabetes mellitus (CMS-HCC)      Subjective:   Patient's case discussed during treatment team. Patient assessed in the milieu for follow up care. She is bright and interactive. She reported her diarrhea subsided yesterday but it did come back this morning X 1. She is denying any SI/HI/AVH. She again asks me to take off my mask so she can make sure it is me. She then laughs and was reassured. She asks when she can go home and stated she is not feeling crazy anymore. We discussed her medication regimen and she was content with checking a VPA over the weekend. She stated she is washing up here in the hospital but has not showered, discussed this with nursing.     Relevant PRNs administered: None     Mental Status Exam:  Appearance:?? ?? ??Well put together   Motor: ?? ??Using walker   Speech/Language:?? ?? ??Loud and Pressured   Mood: ?? ??Calmer, cooperative   Affect: ?? ??Calm and cooperative     Thought process: ??  Circumstantial   Thought content:???? ?? Denied any SI/HI today   Perceptual disturbances:???? ?? ??Denied any AVH  ??   Orientation: ?? ??Oriented to person, place, time, and general circumstances   Attention: ?? ??Poor Attention Span   Concentration: ?? ??Easily Distractible   Memory: ?? ??Intact to interview   ??Fund of knowledge:  ?? ??Consistent with level of education and development   Insight:???? ?? ??Impaired   Judgment:?? ?? ??Impaired   Impulse Control: ?? ??Impaired     Vitals:  Vitals:    04/04/18 0825   BP: 127/86   Pulse: 106   Resp: 18   Temp: 36.6 ??C (97.8 ??F)   SpO2: 96%     Medications:        Current Facility-Administered Medications:   ???  acetaminophen (TYLENOL) tablet 650 mg, 650 mg, Oral, Q6H PRN, Radene Knee, MD  ???  allopurinoL (ZYLOPRIM) tablet 100 mg, 100 mg, Oral, Daily, Radene Knee, MD, 100 mg at 04/04/18 1610  ???  cloZAPine (CLOZARIL) tablet 150 mg, 150 mg, Oral, Daily, Primitivo Gauze, PMHNP, 150 mg at 04/04/18 9604  ???  clozapine (CLOZARIL) tablet 300 mg, 300 mg, Oral, Nightly, Radene Knee, MD, 300 mg at 04/03/18 2113  ???  divalproex (DEPAKOTE) DR tablet 1,000 mg, 1,000 mg, Oral, Nightly, Primitivo Gauze, PMHNP, 1,000 mg at 04/03/18 2113  ???  divalproex (DEPAKOTE) DR tablet 500 mg, 500 mg, Oral, Daily, Radene Knee, MD, 500 mg at 04/04/18 5409  ???  docusate sodium (COLACE) capsule 100 mg, 100 mg, Oral, Daily, Radene Knee, MD, Stopped at 04/03/18 [phone]  ???  famotidine (PEPCID) tablet 10 mg, 10 mg, Oral, BID, Radene Knee, MD, 10 mg at 04/04/18 1478  ???  folic acid (FOLVITE) tablet 1 mg, 1 mg, Oral, Daily, Radene Knee, MD, 1 mg at 04/04/18 2956  ???  glycopyrrolate (ROBINUL) tablet 1 mg, 1 mg, Oral, BID, Radene Knee, MD, 1 mg at 04/04/18 2130  ???  haloperidoL (HALDOL) tablet 5 mg, 5 mg, Oral, Q6H PRN, Radene Knee, MD  ???  imatinib (GLEEVEC) tablet 400 mg, 400 mg, Oral, Daily, Haywood Pao  Donzetta Starch, MD, 400 mg at 04/04/18 0865  ???  influenza vaccine quad (FLUARIX, FLULAVAL, FLUZONE) (6 MOS & UP) 2019-20, 0.5 mL, Intramuscular, During hospitalization, Radene Knee, MD  ???  lisinopriL (PRINIVIL,ZESTRIL) tablet 2.5 mg, 2.5 mg, Oral, Daily, Radene Knee, MD, 2.5 mg at 04/04/18 7846  ???  loperamide (IMODIUM) capsule 2 mg, 2 mg, Oral, TID PRN, Primitivo Gauze, PMHNP, 2 mg at 04/03/18 2124  ???  LORazepam (ATIVAN) tablet 2 mg, 2 mg, Oral, Q6H PRN, Radene Knee, MD  ???  metFORMIN (GLUCOPHAGE) tablet 1,000 mg, 1,000 mg, Oral, Daily, Radene Knee, MD, 1,000 mg at 04/04/18 9629  ???  metoprolol succinate (TOPROL-XL) 24 hr tablet 100 mg, 100 mg, Oral, Daily, Radene Knee, MD, 100 mg at 04/04/18 5284  ???  multivitamin with folic acid 400 mcg tablet 1 tablet, 1 tablet, Oral, Daily, Radene Knee, MD, 1 tablet at 04/04/18 1324  ??? nitrofurantoin (macrocrystal-monohydrate) (MACROBID) 100 MG capsule 100 mg, 100 mg, Oral, Q12H SCH, Radene Knee, MD, 100 mg at 04/04/18 4010  ???  pneumococcal polysacchride (23-valps) (PNEUMOVAX) vaccine 0.5 mL, 0.5 mL, Subcutaneous, During hospitalization, Radene Knee, MD  ???  pravastatin (PRAVACHOL) tablet 10 mg, 10 mg, Oral, Daily, Radene Knee, MD, 10 mg at 04/04/18 2725  ???  pyridoxine (vitamin B6) (B-6) tablet 200 mg, 200 mg, Oral, Daily, Radene Knee, MD, 200 mg at 04/04/18 3664  ???  senna (SENOKOT) tablet 2 tablet, 2 tablet, Oral, Nightly, Radene Knee, MD, 2 tablet at 04/03/18 2113  ???  traZODone (DESYREL) tablet 50 mg, 50 mg, Oral, Nightly PRN, Radene Knee, MD  ???  zinc sulfate (ZINCATE) capsule 220 mg, 220 mg, Oral, Daily, Radene Knee, MD, 220 mg at 04/04/18 4034      Assessment & Plan:  Schizoaffective d/o  -??Clozapine 150mg  daily and 300mg  nightly - cbc w/diff done 3/29, add on pending clozapine level and metabolites, levels low  - Increase Depakote 500mg  daily and 1000mg  nightly - VPA on admission 53.4, re check VPA 4/5  - Colace and senna - HOLD for now due to diarrhea  - Robinul BID  - Metformin  ??  HTN  Metoprolol  Lisinopril  ??  Chronic myelocytic leukemia  - Imatinib 400mg  daily  ??  UTI  - Continue macrobid until 4/4 - re check UA today  - c/o urinary frequency  - diarrhea r/t antibiotic - PRN imodium  ??  Crisis stabilization and d/c planning  - Follows up with Dr. Mayford Knife at [facility] [phone]  - Will consider a [facility] referral  - Is able to return to previous group home  - If continued progress and therapeutic VPA, tentative d/c plan for Monday  ??      Primitivo Gauze, PMHNP    2:47 PM

## 2018-04-04 NOTE — Unmapped (Signed)
Recreational Therapy Treatment  04/03/2018     Patient Name:  Kimberly Mathis       Medical Record Number: [id number]   Date of Birth: Jul 15, 1965  Sex: Female          Room/Bed:  645/645-01    Tx Duration: 0 Min.        Assessment  Note Type: Contact Note  Contact Note: Pt did not attend recreational therapy group based on progressive muscle relaxation.       Plan of Care  3x per day for: 5-7 days per week   Planned Treatment Duration Throughout admission.     Goals:  Goal 1: 1. By 04/09/18, patient will attend 3 recreational therapy treatment sessions for at least 25 minutes each with less than 2 cues, demonstrating improved motivation to RT treatment.  Progress: 0/3 attended  Plan: In Progress  Goal 2: 2. Within 7 RT leisure sessions, patient will demonstrate 3 leisure interests that can used as positive coping skills, demonstrating improved insight into managing stressors and symptoms related to current mental illness.    Progress: 0/3 demonstrated  Plan: In Progress  Goal 3: 3. Prior to discharge, patient will state at least 3 different coping strategies that patient can use to manage mental health symptoms, demonstrating improved likelihood to implement learned skills post discharge.  Progress: 0/3 stated  Plan: In Progress    I attest that I have reviewed the above information.  Signed by Allena Katz LRT/CTRS  Filed 04/03/2018

## 2018-04-04 NOTE — Unmapped (Signed)
Problem: Adult Behavioral Health Plan of Care  Goal: Plan of Care Review  Outcome: Ongoing - Unchanged  Goal: Patient-Specific Goal (Individualization)  Outcome: Ongoing - Unchanged  Goal: Adheres to Safety Considerations for Self and Others  Outcome: Ongoing - Unchanged  Goal: Optimized Coping Skills in Response to Life Stressors  Outcome: Ongoing - Unchanged  Goal: Develops/Participates in Therapeutic Alliance to Support Successful Transition  Outcome: Ongoing - Unchanged  Goal: Rounds/Family Conference  Outcome: Ongoing - Unchanged     Problem: Self-Care Deficit  Goal: Improved Ability to Complete Activities of Daily Living  Outcome: Ongoing - Unchanged     Problem: Fall Injury Risk  Goal: Absence of Fall and Fall-Related Injury  Outcome: Ongoing - Unchanged    Patient denies SI/HI/AH/VH. She became irritable with me when I asked is she still having diarrhea and denied have any loose loose stools, asked the med nurse for imodium with her HS meds and told him she had loose stools this shift. Patient continues po antibiotic for UTI with no other apparent side effects or adverse reactions. Her appetite is good and is resting without complaint.

## 2018-04-04 NOTE — Unmapped (Signed)
Problem: Adult Behavioral Health Plan of Care  Goal: Plan of Care Review  Outcome: Progressing  Goal: Patient-Specific Goal (Individualization)  Outcome: Progressing  Goal: Adheres to Safety Considerations for Self and Others  Outcome: Progressing  Goal: Optimized Coping Skills in Response to Life Stressors  Outcome: Progressing  Goal: Develops/Participates in Therapeutic Alliance to Support Successful Transition  Outcome: Progressing  Goal: Rounds/Family Conference  Outcome: Progressing     Problem: Self-Care Deficit  Goal: Improved Ability to Complete Activities of Daily Living  Outcome: Progressing     Problem: Fall Injury Risk  Goal: Absence of Fall and Fall-Related Injury  Outcome: Progressing     Problem: Skin Injury Risk Increased  Goal: Skin Health and Integrity  Outcome: Progressing     Problem: Diabetes Comorbidity  Goal: Blood Glucose Level Within Desired Range  Outcome: Progressing     Problem: Thought Process Alteration  Goal: Optimal Thought Clarity  Outcome: Progressing       Patient AAO x4. Patient is calm and pleasant this morning. She is seen sitting in the milieu. Patient interacts appropriately with staff and peers. Patient compliant with medications. Denies SI/HI/AH/VH. Patient continues to improve and work toward care plan goals. Patient denies pain. Patient states she has been having some loose stools. Patient monitored q 15 minutes for unpredictable behavior and falls. Will continue to monitor.

## 2018-04-04 NOTE — Unmapped (Signed)
SW met with Pt on this date. Pt presents with bright affect. She was focused on not liking her group home and reports that people were causing trouble and changing up on her in the home. SW explained that it is her guardian's decision for her to return there, which she was ultimately agreeable to. She reports that she is feeling good and sleeping ok. She continues to deny AH along with SI/HI/VH at present. Pt was somewhat focused on discharge.

## 2018-04-04 NOTE — Unmapped (Signed)
Recreational Therapy Treatment  04/04/2018     Patient Name:  Kimberly Mathis       Medical Record Number: [id number]   Date of Birth: Dec 09, 1965  Sex: Female          Room/Bed:  645/645-01    Tx Duration: 0 Min.        Assessment  Note Type: Contact Note  Contact Note: Pt did not attend recreational therapy group based on emotional regulation.       Plan of Care  3x per day for: 5-7 days per week   Planned Treatment Duration Throughout admission.     Goals:  Goal 1: 1. By 04/09/18, patient will attend 3 recreational therapy treatment sessions for at least 25 minutes each with less than 2 cues, demonstrating improved motivation to RT treatment.  Progress: 0/3 attended  Plan: In Progress  Goal 2: 2. Within 7 RT leisure sessions, patient will demonstrate 3 leisure interests that can used as positive coping skills, demonstrating improved insight into managing stressors and symptoms related to current mental illness.    Progress: 0/3 demonstrated  Plan: In Progress  Goal 3: 3. Prior to discharge, patient will state at least 3 different coping strategies that patient can use to manage mental health symptoms, demonstrating improved likelihood to implement learned skills post discharge.  Progress: 0/3 stated  Plan: In Progress    I attest that I have reviewed the above information.  Signed by Allena Katz LRT/CTRS  Filed 04/04/2018

## 2018-04-05 LAB — URINALYSIS
BILIRUBIN UA: NEGATIVE
BLOOD UA: NEGATIVE
GLUCOSE UA: NEGATIVE
HYPHAL YEAST: NONE SEEN /HPF
KETONES UA: 20 — AB
LEUKOCYTE ESTERASE UA: NEGATIVE
NITRITE UA: NEGATIVE
PH UA: 5 (ref 5.0–8.0)
PROTEIN UA: NEGATIVE
RBC UA: 1 /HPF (ref <4)
SPECIFIC GRAVITY UA: 1.018 (ref 1.016–1.022)
SPECIFIC GRAVITY UA: 1.018 /HPF — AB (ref 1.016–1.022)
SQUAMOUS EPITHELIAL: 10 /HPF — ABNORMAL HIGH (ref 0–5)
UROBILINOGEN UA: 0.2
WBC CLUMPS: NONE SEEN /HPF
WBC UA: 1 /HPF (ref 0–2)
YEAST: NONE SEEN /HPF

## 2018-04-05 NOTE — Unmapped (Signed)
Recreational Therapy Treatment  04/04/2018     Patient Name:  Kimberly Mathis       Medical Record Number: [id number]   Date of Birth: June 21, 1965  Sex: Female          Room/Bed:  645/645-01    Tx Duration: 0 Min.        Assessment  Note Type: Contact Note  Contact Note: Pt did not attend recreational therapy group based on gratitude.       Plan of Care  3x per day for: 5-7 days per week   Planned Treatment Duration Throughout admission.     Goals:  Goal 1: 1. By 04/09/18, patient will attend 3 recreational therapy treatment sessions for at least 25 minutes each with less than 2 cues, demonstrating improved motivation to RT treatment.  Progress: 0/3 attended  Plan: In Progress  Goal 2: 2. Within 7 RT leisure sessions, patient will demonstrate 3 leisure interests that can used as positive coping skills, demonstrating improved insight into managing stressors and symptoms related to current mental illness.    Progress: 0/3 demonstrated  Plan: In Progress  Goal 3: 3. Prior to discharge, patient will state at least 3 different coping strategies that patient can use to manage mental health symptoms, demonstrating improved likelihood to implement learned skills post discharge.  Progress: 0/3 stated  Plan: In Progress    I attest that I have reviewed the above information.  Signed by Allena Katz LRT/CTRS  Filed 04/04/2018

## 2018-04-05 NOTE — Unmapped (Signed)
Problem: Adult Behavioral Health Plan of Care  Goal: Plan of Care Review  Outcome: Ongoing - Unchanged  Goal: Patient-Specific Goal (Individualization)  Outcome: Ongoing - Unchanged  Goal: Adheres to Safety Considerations for Self and Others  Outcome: Ongoing - Unchanged  Goal: Optimized Coping Skills in Response to Life Stressors  Outcome: Ongoing - Unchanged  Goal: Develops/Participates in Therapeutic Alliance to Support Successful Transition  Outcome: Ongoing - Unchanged  Goal: Rounds/Family Conference  Outcome: Ongoing - Unchanged     Problem: Self-Care Deficit  Goal: Improved Ability to Complete Activities of Daily Living  Outcome: Ongoing - Unchanged     Problem: Fall Injury Risk  Goal: Absence of Fall and Fall-Related Injury  Outcome: Ongoing - Unchanged    Patient denies SI/HI/AH/VH and does not appear to be RTIS. Her eye contact is good. Patient states They washed me up good today and washed clothes. She continues to have diarrhea. She is isolative to her room and only came out to the day room for VS, medication, and HS snack. Patient made aware of order for urinalysis and cup and obstetrical wipe placed in her bathroom. She has a hat in her commode. Patient verbalized understanding.

## 2018-04-05 NOTE — Unmapped (Signed)
Problem: Adult Behavioral Health Plan of Care  Goal: Plan of Care Review  Outcome: Progressing  Goal: Patient-Specific Goal (Individualization)  Outcome: Progressing  Goal: Adheres to Safety Considerations for Self and Others  Outcome: Progressing  Goal: Optimized Coping Skills in Response to Life Stressors  Outcome: Progressing  Goal: Develops/Participates in Therapeutic Alliance to Support Successful Transition  Outcome: Progressing  Goal: Rounds/Family Conference  Outcome: Progressing     Problem: Self-Care Deficit  Goal: Improved Ability to Complete Activities of Daily Living  Outcome: Progressing     Problem: Fall Injury Risk  Goal: Absence of Fall and Fall-Related Injury  Outcome: Progressing     Problem: Skin Injury Risk Increased  Goal: Skin Health and Integrity  Outcome: Progressing     Problem: Diabetes Comorbidity  Goal: Blood Glucose Level Within Desired Range  Outcome: Progressing     Problem: Thought Process Alteration  Goal: Optimal Thought Clarity  Outcome: Progressing   Pt up in milieu. Calm and cooperative. Denies SI,HI, AVH. Good eye contact. Doesn't appear to be responding to internal stimuli. Taking foods and fluids well.  Q 15 min checks for safety continue.

## 2018-04-05 NOTE — Unmapped (Signed)
SW met with Pt on this date. Pt reports that she is feeling ok. Pt was very focused on her bowel movements during follow-up, and SW had a difficult time redirecting her from this topic. SW encouraged Pt to try to attend groups to which Pt responded, If I get up I might shit myself. Pt denies SI/HI/AH/VH at present. Pt was also observed to be drooling on herself.

## 2018-04-05 NOTE — Unmapped (Signed)
[facility]  Psychiatry      Daily Progress Note      Admit date/time: 03/31/2018   LOS: 4    Diagnosis & Problems:  Principal Problem:    Schizoaffective disorder, bipolar type (CMS-HCC)  Active Problems:    Type II diabetes mellitus (CMS-HCC)      Subjective:   Patient's case discussed during treatment team. Patient assessed in the milieu for follow up care. She is bright today, speech is still difficult to understand at times but she is much less elevated and loud than on admission. She again asks me to show her my nose so she can tell if I'm her provider or not and then laughs. She reported sleeping better last night. She was very happy to tell me that the nurses helped her shower last night and did a very good job. She talked about having new clothes on and feeling better and clean. She had diarrhea X 1 this morning, labs were monitored. She is denying any further SI/HI/AVH. She stated im not crazy anymore. She is content with checking her Depakote level on Sunday with a tentative d/c plan on Monday.     Relevant PRNs administered: Imodium 2mg  1500 and Trazodone 50mg  2204    Mental Status Exam:  Appearance:?? ?? ??Well put together   Motor: ?? ??Using walker   Speech/Language:?? ?? ??Loud and Pressured   Mood: ?? ??Calmer, cooperative   Affect: ?? ??Calm and cooperative     Thought process: ??  Circumstantial   Thought content:???? ?? Denied any SI/HI today   Perceptual disturbances:???? ?? ??Denied any AVH  ??   Orientation: ?? ??Oriented to person, place, time, and general circumstances   Attention: ?? ??Poor Attention Span   Concentration: ?? ??Easily Distractible   Memory: ?? ??Intact to interview   ??Fund of knowledge:  ?? ??Consistent with level of education and development   Insight:???? ?? ??Impaired   Judgment:?? ?? ??Impaired   Impulse Control: ?? ??Improving     Vitals:  Vitals:    04/05/18 0737   BP: 141/79   Pulse: 105   Resp: 16   Temp: 36.4 ??C (97.6 ??F)   SpO2: 98%     Medications:        Current Facility-Administered Medications:   ???  acetaminophen (TYLENOL) tablet 650 mg, 650 mg, Oral, Q6H PRN, Radene Knee, MD  ???  allopurinoL (ZYLOPRIM) tablet 100 mg, 100 mg, Oral, Daily, Radene Knee, MD, 100 mg at 04/05/18 8469  ???  cloZAPine (CLOZARIL) tablet 150 mg, 150 mg, Oral, Daily, Primitivo Gauze, PMHNP, 150 mg at 04/05/18 6295  ???  clozapine (CLOZARIL) tablet 300 mg, 300 mg, Oral, Nightly, Radene Knee, MD, 300 mg at 04/04/18 2203  ???  divalproex (DEPAKOTE) DR tablet 1,000 mg, 1,000 mg, Oral, Nightly, Primitivo Gauze, PMHNP, 1,000 mg at 04/04/18 2202  ???  divalproex (DEPAKOTE) DR tablet 500 mg, 500 mg, Oral, Daily, Radene Knee, MD, 500 mg at 04/05/18 2841  ???  docusate sodium (COLACE) capsule 100 mg, 100 mg, Oral, Daily, Radene Knee, MD, 100 mg at 04/05/18 0905  ???  famotidine (PEPCID) tablet 10 mg, 10 mg, Oral, BID, Radene Knee, MD, 10 mg at 04/05/18 3244  ???  folic acid (FOLVITE) tablet 1 mg, 1 mg, Oral, Daily, Radene Knee, MD, 1 mg at 04/05/18 0102  ???  glycopyrrolate (ROBINUL) tablet 1 mg, 1 mg, Oral, BID, Radene Knee, MD, 1 mg at  04/05/18 0905  ???  haloperidoL (HALDOL) tablet 5 mg, 5 mg, Oral, Q6H PRN, Radene Knee, MD  ???  imatinib (GLEEVEC) tablet 400 mg, 400 mg, Oral, Daily, Radene Knee, MD, 400 mg at 04/05/18 5784  ???  influenza vaccine quad (FLUARIX, FLULAVAL, FLUZONE) (6 MOS & UP) 2019-20, 0.5 mL, Intramuscular, During hospitalization, Radene Knee, MD  ???  lisinopriL (PRINIVIL,ZESTRIL) tablet 2.5 mg, 2.5 mg, Oral, Daily, Radene Knee, MD, 2.5 mg at 04/05/18 6962  ???  loperamide (IMODIUM) capsule 2 mg, 2 mg, Oral, TID PRN, Primitivo Gauze, PMHNP, 2 mg at 04/04/18 1500  ???  LORazepam (ATIVAN) tablet 2 mg, 2 mg, Oral, Q6H PRN, Radene Knee, MD  ???  metFORMIN (GLUCOPHAGE) tablet 1,000 mg, 1,000 mg, Oral, Daily, Radene Knee, MD, 1,000 mg at 04/05/18 9528  ???  metoprolol succinate (TOPROL-XL) 24 hr tablet 100 mg, 100 mg, Oral, Daily, Radene Knee, MD, 100 mg at 04/05/18 4132  ???  multivitamin with folic acid 400 mcg tablet 1 tablet, 1 tablet, Oral, Daily, Radene Knee, MD, 1 tablet at 04/05/18 4401  ???  nitrofurantoin (macrocrystal-monohydrate) (MACROBID) 100 MG capsule 100 mg, 100 mg, Oral, Q12H SCH, Radene Knee, MD, 100 mg at 04/05/18 0272  ???  pneumococcal polysacchride (23-valps) (PNEUMOVAX) vaccine 0.5 mL, 0.5 mL, Subcutaneous, During hospitalization, Radene Knee, MD  ???  pravastatin (PRAVACHOL) tablet 10 mg, 10 mg, Oral, Daily, Radene Knee, MD, 10 mg at 04/05/18 5366  ???  pyridoxine (vitamin B6) (B-6) tablet 200 mg, 200 mg, Oral, Daily, Radene Knee, MD, 200 mg at 04/05/18 4403  ???  senna (SENOKOT) tablet 2 tablet, 2 tablet, Oral, Nightly, Radene Knee, MD, 2 tablet at 04/04/18 2203  ???  traZODone (DESYREL) tablet 50 mg, 50 mg, Oral, Nightly PRN, Radene Knee, MD, 50 mg at 04/04/18 2204  ???  zinc sulfate (ZINCATE) capsule 220 mg, 220 mg, Oral, Daily, Radene Knee, MD, 220 mg at 04/05/18 4742      Assessment & Plan:  Schizoaffective d/o  -??Clozapine 150mg  daily and 300mg  nightly - cbc w/diff done 3/29, add on pending clozapine level and metabolites, levels low  -??Increase??Depakote 500mg  daily and??1000mg  nightly - VPA on admission 53.4, re check VPA 4/5  - Colace and senna - HOLD for now due to diarrhea  - Robinul BID  - Metformin  ??  HTN  Metoprolol  Lisinopril  ??  Chronic myelocytic leukemia  - Imatinib 400mg  daily  ??  UTI  - Macrobid completed today  - re check UA  - diarrhea r/t antibiotic - PRN imodium   ??  Crisis stabilization and d/c planning  - Follows up with Dr. Mayford Knife at [facility]??  - Guardian Peggye Fothergill [phone]  - Will consider a [facility] referral  - Is able to return to previous group home  - If therapeutic VPA, tentative d/c plan for Monday  ??    Primitivo Gauze, PMHNP    10:31 AM

## 2018-04-05 NOTE — Unmapped (Signed)
Recreational Therapy Treatment  04/04/2018     Patient Name:  Kimberly Mathis       Medical Record Number: [id number]   Date of Birth: 18-Jun-1965  Sex: Female          Room/Bed:  645/645-01    Tx Duration: 0 Min.        Assessment  Note Type: Contact Note  Contact Note: Pt did not attend recreational therapy group based on leisure interests.       Plan of Care  3x per day for: 5-7 days per week   Planned Treatment Duration Throughout admission.     Goals:  Goal 1: 1. By 04/09/18, patient will attend 3 recreational therapy treatment sessions for at least 25 minutes each with less than 2 cues, demonstrating improved motivation to RT treatment.  Progress: 0/3 attended  Plan: In Progress  Goal 2: 2. Within 7 RT leisure sessions, patient will demonstrate 3 leisure interests that can used as positive coping skills, demonstrating improved insight into managing stressors and symptoms related to current mental illness.    Progress: 0/3 demonstrated  Plan: In Progress  Goal 3: 3. Prior to discharge, patient will state at least 3 different coping strategies that patient can use to manage mental health symptoms, demonstrating improved likelihood to implement learned skills post discharge.  Progress: 0/3 stated  Plan: In Progress    I attest that I have reviewed the above information.  Signed by Allena Katz LRT/CTRS  Filed 04/04/2018

## 2018-04-05 NOTE — Unmapped (Addendum)
Pt has a medication management apt with Dr. Mayford Knife on 04/15/18 at 12:50pm  *this is a telephone apt*    [facility]  [address] [place] [address]    P: [phone]  F: [phone]    Pt has a smoking cessation session with Hilda Lias on 05/08/18 at 3:00pm    [facility]  [address] [place], [place] [address]  ??  Ph: [phone]  Fax: [phone]

## 2018-04-05 NOTE — Unmapped (Addendum)
Reason for inpatient admission: aggression    Discharge Diagnosis: Schizoaffective disorder, bipolar type       Pt will be discharged to: [facility]                                            [address]                                            [place] [address]    Contact information: Group Home Manager June Obure [phone]    ___ Conditional or _x_ Unconditional Discharge      Pt will be given a   ___ 7 day supply    ___30 day script    _x__ Other (comments) e prescribed to [facility] long term pharmacy in [place]       Alcohol and/or Tobacco Cessation education provided:  ___x_____ Yes (documentation completed)   ________ N/A    Please indicate below if patient has a legal guardian:   ___x______ Guardian   ______ If Yes, reviewed aftercare summary with guardian (name of guardian and phone#) Ander Gaster [phone]    Psychiatric Advanced Directives:    ___x_____  Patient does not have a Psychiatric Advanced Directives   ________ Patient has an advanced directive-Name of provider _________________   ________  Patient offered opportunity and completed Psychiatric Advanced Directives   ___x_____  Patient declines opportunity offered to create Psychiatric Advanced                                  Directives    Medical Advanced Directives:    ___x_____  Patient does not have a Medical Advanced Directives  ________ Patient has an advanced directive-Name of provider _________________   ________  Patient offered opportunity and completed Medical Advanced Directives   ___x_____  Patient declines opportunity offered to create Medical Advanced Directives        Emergency related questions related to inpatient stay contact Behavioral Health Unit at [phone] and attending physician is Dr. Rickey Barbara     The transition record/AVS was reviewed with patient prior to discharge.    If you need to obtain any results of studies pending at time of discharge contact medical records at ext [phone]

## 2018-04-06 NOTE — Unmapped (Signed)
[facility]  Psychiatry      Daily Progress Note      Admit date/time: 03/31/2018  Length of Stay: 5    Subjective:   Patient seen today for follow-up in her room.  She stated she did not feel very well today.  She noted nausea/diarrhea and 1 episode of vomiting this morning. She was not very talkative and wanted to rest. She remained in bed with her eyes closed during our interaction. She denied any SI/HI or AVH.       ROS: Negative except as per HPI    Objective:    Vitals:  Vitals:    04/06/18 0737   BP: 146/99   Pulse: 108   Resp: 18   Temp: 36.8 ??C (98.3 ??F)   SpO2: 98%           Mental Status Exam:  Appearance:    Appears stated age and obese   Motor:   uses walker for ambulation   Speech/Language:    short responses   Mood:   fine and irritable   Affect:   Mood congruent   Thought process:   coherent, goal directed   Thought content:    Denies suicidal or homicidal ideation, intent, or plan.   Perceptual disturbances:     Denies auditory and visual hallucinations, behavior not concerning for response to internal stimuli     Orientation:   Oriented to person, place, time, and general circumstances   Attention:   Poor Attention Span   Concentration:   distractible   Memory:   Intact to interview    Fund of knowledge:    Consistent with level of education and development   Insight:     Impaired   Judgment:    Impaired   Impulse Control:   Limited     PE:   Well developed, in no apparent distress    Test Results:  Data Review:  Lab Results   Component Value Date    WBC 10.3 (H) 04/04/2018    HGB 10.0 (L) 04/04/2018    HCT 30.2 (L) 04/04/2018    PLT 289 04/04/2018       Lab Results   Component Value Date    NA 142 04/04/2018    K 3.7 04/04/2018    CL 106 04/04/2018    CO2 26.5 04/04/2018    BUN <5 (L) 04/04/2018    CREATININE 0.46 (L) 04/04/2018    GLU 96 04/04/2018    CALCIUM 9.3 04/04/2018       Lab Results   Component Value Date    BILITOT 0.2 (L) 03/31/2018    PROT 7.4 03/31/2018    ALBUMIN 4.2 03/31/2018    ALT 17 03/31/2018    AST 23 03/31/2018    ALKPHOS 65 03/31/2018           Medications:  Scheduled Meds:  ??? allopurinoL  100 mg Oral Daily   ??? cloZAPine  150 mg Oral Daily   ??? cloZAPine  300 mg Oral Nightly   ??? divalproex  1,000 mg Oral Nightly   ??? divalproex  500 mg Oral Daily   ??? docusate sodium  100 mg Oral Daily   ??? famotidine  10 mg Oral BID   ??? folic acid  1 mg Oral Daily   ??? glycopyrrolate  1 mg Oral BID   ??? imatinib  400 mg Oral Daily   ??? flu vacc [id number] 6mos up(PF)  0.5 mL Intramuscular During hospitalization   ???  lisinopriL  2.5 mg Oral Daily   ??? metFORMIN  1,000 mg Oral Daily   ??? metoprolol succinate  100 mg Oral Daily   ??? multivitamin with folic acid  1 tablet Oral Daily   ??? pneumococcal polysacchride (23-valps)  0.5 mL Subcutaneous During hospitalization   ??? pravastatin  10 mg Oral Daily   ??? pyridoxine (vitamin B6)  200 mg Oral Daily   ??? senna  2 tablet Oral Nightly   ??? zinc sulfate  220 mg Oral Daily     Continuous Infusions:  PRN Meds:.acetaminophen, haloperidoL, loperamide, LORazepam, traZODone        Assessment & Plan:    4/4: will have nursing recheck VS. Continue to monitor for n/v/d which seems to be improved this afternoon.    Schizoaffective d/o  -??Clozapine 150mg  daily and 300mg  nightly   - cbc w/diff done 3/29, add on pending clozapine level and metabolites, levels low  -??Increased??Depakote 500mg  daily and??1000mg  nightly - VPA on admission 53.4, re check VPA 4/5  - Decrease to PRN Colace and senna??due to diarrhea  - Robinul BID  - Metformin  ??  HTN  Metoprolol  Lisinopril  ??  Chronic myelocytic leukemia  - Imatinib 400mg  daily  ??  UTI  - Macrobid completed, 4/4  - re checked UA 4/3  - diarrhea r/t antibiotic - continue PRN imodium   ??  Crisis stabilization and d/c planning  - Follows up with Dr. Mayford Knife at [facility]??  - Guardian Peggye Fothergill [phone]  - Is able to return to previous group home  - If therapeutic VPA, tentative d/c plan for Monday

## 2018-04-06 NOTE — Unmapped (Signed)
Calm and cooperative this shift. Compliant with medications. Denies SI,HI, AVH. Q 15 min checks maintained.   Problem: Adult Behavioral Health Plan of Care  Goal: Plan of Care Review  Outcome: Progressing  Goal: Patient-Specific Goal (Individualization)  Outcome: Progressing  Goal: Adheres to Safety Considerations for Self and Others  Outcome: Progressing  Goal: Optimized Coping Skills in Response to Life Stressors  Outcome: Progressing  Goal: Develops/Participates in Therapeutic Alliance to Support Successful Transition  Outcome: Progressing  Goal: Rounds/Family Conference  Outcome: Progressing   Problem: Self-Care Deficit  Goal: Improved Ability to Complete Activities of Daily Living  Outcome: Progressing     Problem: Fall Injury Risk  Goal: Absence of Fall and Fall-Related Injury  Outcome: Progressing     Problem: Skin Injury Risk Increased  Goal: Skin Health and Integrity  Outcome: Progressing     Problem: Diabetes Comorbidity  Goal: Blood Glucose Level Within Desired Range  Outcome: Progressing     Problem: Thought Process Alteration  Goal: Optimal Thought Clarity  Outcome: Progressing

## 2018-04-06 NOTE — Unmapped (Signed)
RECREATIONAL THERAPY  04/05/2018    Patient Name: Kimberly Mathis   Medical Record Number: [id number]   Date of Birth: [birth year]  Sex: Female     Tx Duration: 26 Min.  Group: Recreational therapy  Attendance: Attended    Session Summary:  Group Leader Observations: Cinematherapy: Pt presented with an appropriate affect and mood. Pt attended to the American biographical film [employer], demonstrating skills needed for community reintegration. Pt enjoyed snacks and soda throughout. Pt tolerated session.    Follows Direction: Followed directions  Interactions: Interacted appropriately  Affect: Appropriate to content  Mood: Calm     Plan of Care  3x per day for: 5-7 days per week   Planned Treatment Duration: Throughout admission.     Goals:  1. By 04/09/18, patient will attend 3 recreational therapy treatment sessions for at least 25 minutes each with less than 2 cues, demonstrating improved motivation to RT treatment. Progress: 1/3 attended  Plan: In Progress  2. Within 7 RT leisure sessions, patient will demonstrate 3 leisure interests that can used as positive coping skills, demonstrating improved insight into managing stressors and symptoms related to current mental illness.   Progress: 1/3 demonstrated (dancing)  Plan: In Progress  3. Prior to discharge, patient will state at least 3 different coping strategies that patient can use to manage mental health symptoms, demonstrating improved likelihood to implement learned skills post discharge. Progress: 0/3 stated  Plan: In Progress           Interventions: Community reintegration       I attest that I have reviewed the above information.  Signed: Jenita Seashore, LRT/CTRS   Filed 04/05/2018

## 2018-04-06 NOTE — Unmapped (Signed)
Patient in dayroom at time of assessment. A+Ox4. Denies SI/HI/AVH; does not appear to be RTIS. Using walker for mobility. Fall precautions reinforced. Denies loose stools or other GI distress. Appears to be attending to personal hygiene. Maintains eye contact during assessment. Easily engaged. Safety measures in place. Patient will continue to be monitored Q15 minutes.

## 2018-04-06 NOTE — Unmapped (Signed)
RECREATIONAL THERAPY  04/05/2018    Patient Name: Kimberly Mathis   Medical Record Number: [id number]   Date of Birth: Sep 14, 1965  Sex: Female     Tx Duration: 50 Min.  Group: Recreational therapy  Attendance: Attended    Session Summary:  Group Leader Observations: Leisure: Pt presented with an affect that brightened with interactions and a calm mood. Pt participated in leisure activities of choice (dancing). Pt interactions were appropriate with peers and staff. Pt tolerated session.    Follows Direction: Followed directions  Interactions: Interacted appropriately  Affect: Appropriate to content  Mood: Calm     Plan of Care  3x per day for: 5-7 days per week   Planned Treatment Duration: Throughout admission.     Goals:  1. By 04/09/18, patient will attend 3 recreational therapy treatment sessions for at least 25 minutes each with less than 2 cues, demonstrating improved motivation to RT treatment. Progress: 0/3 attended  Plan: In Progress  2. Within 7 RT leisure sessions, patient will demonstrate 3 leisure interests that can used as positive coping skills, demonstrating improved insight into managing stressors and symptoms related to current mental illness.   Progress: 1/3 demonstrated (dancing)  Plan: In Progress  3. Prior to discharge, patient will state at least 3 different coping strategies that patient can use to manage mental health symptoms, demonstrating improved likelihood to implement learned skills post discharge. Progress: 0/3 stated  Plan: In Progress           Interventions: Leisure       I attest that I have reviewed the above information.  Signed: Jenita Seashore, LRT/CTRS   Filed 04/05/2018

## 2018-04-07 LAB — COMPREHENSIVE METABOLIC PANEL
ALKALINE PHOSPHATASE: 53 U/L (ref 46–116)
ALT (SGPT): 15 U/L (ref 7–40)
ANION GAP: 8 mmol/L — ABNORMAL LOW (ref 9–15)
AST (SGOT): 21 U/L (ref 13–40)
BILIRUBIN TOTAL: <0.2 mg/dL — ABNORMAL LOW (ref 0.3–1.2)
BLOOD UREA NITROGEN: 7 mg/dL — ABNORMAL LOW (ref 9–23)
BUN / CREAT RATIO: 15
CALCIUM: 10 mg/dL (ref 8.3–10.6)
CHLORIDE: 103 mmol/L (ref 98–107)
CO2: 29.9 mmol/L (ref 20.0–31.0)
CREATININE: 0.46 mg/dL — ABNORMAL LOW (ref 0.50–0.80)
EGFR CKD-EPI AA FEMALE: >90 mL/min/{1.73_m2} (ref >=60)
EGFR CKD-EPI NON-AA FEMALE: >90 mL/min/{1.73_m2} (ref >=60)
GLUCOSE RANDOM: 116 mg/dL — ABNORMAL HIGH (ref 70–99)
POTASSIUM: 4 mmol/L (ref 3.5–5.1)
POTASSIUM: 4 mmol/L — ABNORMAL LOW (ref 3.5–5.1)
SODIUM: 141 mmol/L (ref 136–145)

## 2018-04-07 NOTE — Unmapped (Signed)
[facility]  Psychiatry      Daily Progress Note      Admit date/time: 03/31/2018  Length of Stay: 6    Subjective:   Patient seen today for follow-up in the milieu. She lists various somatic symptoms (nausea, bilat knee pain, upset stomach) and says she feels sleepy today. She denies any further episodes of diarrhea and her appetite is very good. She has not heard any voices since she was admitted to the hospital. Mood remains irritable/labile. She denies any SI/HI today.        Per nursing patient continued to report nausea and diarrhea last night -diarrhea/loose stools not observed by staff and patient ate all of her meals.      ROS: Negative except as per HPI    Objective:    Vitals:  Vitals:    04/07/18 0815   BP:    Pulse:    Resp:    Temp:    SpO2: 96%     Mental Status Exam:  Appearance:  ??  Appears stated age and obese   Motor: ??  uses walker for ambulation   Speech/Language:  ??  short responses   Mood: ??  fine and irritable   Affect: ??  Mood congruent   Thought process: ??  coherent, goal directed   Thought content:   ?? Denies suicidal or homicidal ideation, intent, or plan.   Perceptual disturbances:   ??  Denies auditory and visual hallucinations, behavior not concerning for response to internal stimuli  ??   Orientation: ??  Oriented to person, place, time, and general circumstances   Attention: ??  Poor Attention Span   Concentration: ??  distractible   Memory: ??  Intact to interview    Fund of knowledge:  ??  Consistent with level of education and development   Insight:   ??  Impaired   Judgment:  ??  Impaired   Impulse Control: ??  Limited         PE:   Well developed, in no apparent distress    Test Results:  Data Review:  Lab Results   Component Value Date    WBC 10.3 (H) 04/04/2018    HGB 10.0 (L) 04/04/2018    HCT 30.2 (L) 04/04/2018    PLT 289 04/04/2018       Lab Results   Component Value Date    NA 142 04/04/2018    K 3.7 04/04/2018    CL 106 04/04/2018    CO2 26.5 04/04/2018    BUN <5 (L) 04/04/2018    CREATININE 0.46 (L) 04/04/2018    GLU 96 04/04/2018    CALCIUM 9.3 04/04/2018       Lab Results   Component Value Date    BILITOT 0.2 (L) 03/31/2018    PROT 7.4 03/31/2018    ALBUMIN 4.2 03/31/2018    ALT 17 03/31/2018    AST 23 03/31/2018    ALKPHOS 65 03/31/2018           Medications:  Scheduled Meds:  ??? allopurinoL  100 mg Oral Daily   ??? cloZAPine  150 mg Oral Daily   ??? cloZAPine  300 mg Oral Nightly   ??? divalproex  1,000 mg Oral Nightly   ??? divalproex  500 mg Oral Daily   ??? famotidine  10 mg Oral BID   ??? folic acid  1 mg Oral Daily   ??? glycopyrrolate  1 mg Oral BID   ??? imatinib  400 mg Oral Daily   ??? flu vacc [id number] 6mos up(PF)  0.5 mL Intramuscular During hospitalization   ??? lisinopriL  2.5 mg Oral Daily   ??? metFORMIN  1,000 mg Oral Daily   ??? metoprolol succinate  100 mg Oral Daily   ??? multivitamin with folic acid  1 tablet Oral Daily   ??? pneumococcal polysacchride (23-valps)  0.5 mL Subcutaneous During hospitalization   ??? pravastatin  10 mg Oral Daily   ??? pyridoxine (vitamin B6)  200 mg Oral Daily   ??? zinc sulfate  220 mg Oral Daily     Continuous Infusions:  PRN Meds:.acetaminophen, docusate sodium, haloperidoL, loperamide, LORazepam, senna, traZODone        Assessment & Plan:  ??  4/4: will have nursing recheck VS. Continue to monitor for n/v/d which seems to be improved this afternoon.  ??  Schizoaffective d/o  -??Clozapine 150mg  daily and 300mg  nightly   - cbc w/diff done 3/29, add on pending clozapine level and metabolites, levels low  -??Increased??Depakote 500mg  daily and??1000mg  nightly - VPA on admission 53.4, re check VPA 4/5 was 72.3  - Decrease to PRN Colace and senna??due to diarrhea  - Robinul BID  - Metformin  ??  HTN  Metoprolol  Lisinopril  ??  Chronic myelocytic leukemia  - Imatinib 400mg  daily  ??  UTI  -??Macrobid completed, 4/4  -??re checked UA 4/3  - diarrhea r/t antibiotic - continue PRN imodium   ??  Crisis stabilization and d/c planning  - Follows up with Dr[facility] Mayford Knife at [facility]  - Guardian Peggye Fothergill [phone]  - Is able to return to previous group home  - If therapeutic VPA, tentative d/c plan for Monday

## 2018-04-07 NOTE — Unmapped (Signed)
Problem: Adult Behavioral Health Plan of Care  Goal: Plan of Care Review  Outcome: Progressing  Goal: Patient-Specific Goal (Individualization)  Outcome: Progressing  Goal: Adheres to Safety Considerations for Self and Others  Outcome: Progressing  Goal: Optimized Coping Skills in Response to Life Stressors  Outcome: Progressing  Goal: Develops/Participates in Therapeutic Alliance to Support Successful Transition  Outcome: Progressing  Goal: Rounds/Family Conference  Outcome: Progressing     Problem: Self-Care Deficit  Goal: Improved Ability to Complete Activities of Daily Living  Outcome: Progressing     Problem: Fall Injury Risk  Goal: Absence of Fall and Fall-Related Injury  Outcome: Progressing     Problem: Skin Injury Risk Increased  Goal: Skin Health and Integrity  Outcome: Progressing     Problem: Diabetes Comorbidity  Goal: Blood Glucose Level Within Desired Range  Outcome: Progressing     Problem: Thought Process Alteration  Goal: Optimal Thought Clarity  Outcome: Progressing   Patient displays an appropriate affect and mood. Patient is underlying irritable in conversation, however is cooperative. Patient denies SI/HI and AVH. Patient is compliant with medications and is encouraged to attend groups. Patient is progressing towards her treatment goals. Q 15 minute safety checks continue.

## 2018-04-07 NOTE — Unmapped (Signed)
Recreational Therapy Treatment  04/07/2018     Patient Name:  Kimberly Mathis       Medical Record Number: [id number]   Date of Birth: 03-Jul-1965  Sex: Female          Room/Bed:  645/645-01    Tx Duration: 0 Min.   Missed Treatment Reason: Patient unwilling to participate    Assessment  Note Type: Contact Note  Contact Note: Pt did not attend recreational therapy group based on wellness and recovery.       Plan of Care  3x per day for: 5-7 days per week   Planned Treatment Duration Throughout admission.     Goals:  Goal 1: 1. By 04/09/18, patient will attend 3 recreational therapy treatment sessions for at least 25 minutes each with less than 2 cues, demonstrating improved motivation to RT treatment.  Progress: 1/3 attended  Plan: In Progress  Goal 2: 2. Within 7 RT leisure sessions, patient will demonstrate 3 leisure interests that can used as positive coping skills, demonstrating improved insight into managing stressors and symptoms related to current mental illness.    Progress: 1/3 demonstrated (dancing)  Plan: In Progress  Goal 3: 3. Prior to discharge, patient will state at least 3 different coping strategies that patient can use to manage mental health symptoms, demonstrating improved likelihood to implement learned skills post discharge.  Progress: 0/3 stated  Plan: In Progress    I attest that I have reviewed the above information.  Signed by Allena Katz LRT/CTRS  Filed 04/07/2018

## 2018-04-07 NOTE — Unmapped (Signed)
Problem: Adult Behavioral Health Plan of Care  Goal: Plan of Care Review  Outcome: Progressing  Goal: Patient-Specific Goal (Individualization)  Outcome: Progressing  Goal: Adheres to Safety Considerations for Self and Others  Outcome: Progressing  Goal: Optimized Coping Skills in Response to Life Stressors  Outcome: Progressing  Goal: Develops/Participates in Therapeutic Alliance to Support Successful Transition  Outcome: Progressing  Goal: Rounds/Family Conference  Outcome: Progressing     Problem: Self-Care Deficit  Goal: Improved Ability to Complete Activities of Daily Living  Outcome: Progressing     Problem: Fall Injury Risk  Goal: Absence of Fall and Fall-Related Injury  Outcome: Progressing     Problem: Skin Injury Risk Increased  Goal: Skin Health and Integrity  Outcome: Progressing     Problem: Diabetes Comorbidity  Goal: Blood Glucose Level Within Desired Range  Outcome: Progressing     Problem: Thought Process Alteration  Goal: Optimal Thought Clarity  Outcome: Progressing     Pt was calm and cooperative during conversation.  Pt denies SI/HI/A&V hallucinations.  Pt has been withdrawn in her room due to pt stating feeling nauseous and having diarrhea.  Pt has been compliant with medications.  No further behaviors noted.  Pt will continue q 15 in safety checks.

## 2018-04-08 MED ORDER — ALLOPURINOL 100 MG TABLET
ORAL_TABLET | Freq: Every day | ORAL | 0 refills | 0.00000 days | Status: CP
Start: 2018-04-08 — End: 2018-05-08

## 2018-04-08 MED ORDER — DIVALPROEX 500 MG TABLET,DELAYED RELEASE: 500 mg | tablet | Freq: Every day | 0 refills | 0 days | Status: AC

## 2018-04-08 MED ORDER — GLYCOPYRROLATE 1 MG TABLET
ORAL_TABLET | Freq: Two times a day (BID) | ORAL | 0 refills | 0.00000 days | Status: CP
Start: 2018-04-08 — End: 2018-05-08

## 2018-04-08 MED ORDER — LOPERAMIDE 2 MG CAPSULE
ORAL_CAPSULE | Freq: Three times a day (TID) | ORAL | 0 refills | 0.00000 days | Status: CP | PRN
Start: 2018-04-08 — End: 2018-05-08

## 2018-04-08 MED ORDER — DIVALPROEX 500 MG TABLET,DELAYED RELEASE
ORAL_TABLET | Freq: Every evening | ORAL | 0 refills | 0.00000 days | Status: CP
Start: 2018-04-08 — End: 2018-05-08

## 2018-04-08 MED ORDER — CLOZAPINE 50 MG TABLET
ORAL_TABLET | Freq: Every day | ORAL | 0 refills | 0 days | Status: CP
Start: 2018-04-08 — End: 2018-05-08

## 2018-04-08 MED ORDER — MULTIVITAMIN WITH FOLIC ACID 400 MCG TABLET
ORAL_TABLET | Freq: Every day | ORAL | 0 refills | 0.00000 days | Status: CP
Start: 2018-04-08 — End: 2018-05-08

## 2018-04-08 MED ORDER — PRAVASTATIN 10 MG TABLET
ORAL_TABLET | Freq: Every day | ORAL | 0 refills | 0 days | Status: CP
Start: 2018-04-08 — End: 2018-05-08

## 2018-04-08 MED ORDER — DOCUSATE SODIUM 100 MG CAPSULE
ORAL_CAPSULE | Freq: Every day | ORAL | 0 refills | 0 days | Status: CP | PRN
Start: 2018-04-08 — End: 2018-05-08

## 2018-04-08 MED ORDER — SENNOSIDES 8.6 MG TABLET
ORAL_TABLET | Freq: Every evening | ORAL | 0 refills | 0.00000 days | Status: CP | PRN
Start: 2018-04-08 — End: 2018-05-08

## 2018-04-08 MED ORDER — METOPROLOL SUCCINATE ER 100 MG TABLET,EXTENDED RELEASE 24 HR
ORAL_TABLET | Freq: Every day | ORAL | 0 refills | 0.00000 days | Status: CP
Start: 2018-04-08 — End: 2018-05-08

## 2018-04-08 MED ORDER — METFORMIN 500 MG TABLET
ORAL_TABLET | Freq: Every day | ORAL | 0 refills | 0 days | Status: CP
Start: 2018-04-08 — End: 2018-05-08

## 2018-04-08 MED ORDER — CLOZAPINE 100 MG TABLET
ORAL_TABLET | Freq: Every evening | ORAL | 0 refills | 0.00000 days | Status: CP
Start: 2018-04-08 — End: 2018-05-08

## 2018-04-08 MED ORDER — LISINOPRIL 2.5 MG TABLET
ORAL_TABLET | Freq: Every day | ORAL | 0 refills | 0 days | Status: CP
Start: 2018-04-08 — End: 2018-05-08

## 2018-04-08 NOTE — Unmapped (Signed)
Recreational Therapy Treatment  04/08/2018     Patient Name:  Kimberly Mathis       Medical Record Number: [id number]   Date of Birth: June 08, 1965  Sex: Female          Room/Bed:  645/645-01     Current Level of Functioning/Functional Outcomes at D/C: Pt attended 2 out of 21 RT sessions offered during treatment. Pt with fair mood and congruent affect. Pt???s interactions have been appropriate. Pt has been visible in the milieu, however minimally interactive with staff/peers.    Comments: Pt attended sessions and was educated on [place], Pharmacologist, Leisure Education and [employer].   Aftercare/Discharge Recommendations: LRT recommends pt to comply with f/u care and seek opportunities to implement coping skills/leisure interests that pt learned or practiced during admission to promote and enhance psychosocial health and well-being.   Assessment of patient's potential for independent functioning/community integration: Pt was independent when engaging in leisure interests or group sessions while in treatment.   Pt was given information on community resources upon discharge.   Participated in D/C Planning: Patient  Agreed with D/C Planning and satisfied with D/C Plan: Yes    Goals:  Goal 1: 1. By 04/09/18, patient will attend 3 recreational therapy treatment sessions for at least 25 minutes each with less than 2 cues, demonstrating improved motivation to RT treatment.  Progress: 1/3 attended  Plan: In Progress  Goal 2: 2. Within 7 RT leisure sessions, patient will demonstrate 3 leisure interests that can used as positive coping skills, demonstrating improved insight into managing stressors and symptoms related to current mental illness.    Progress: 1/3 demonstrated (dancing)  Plan: In Progress  Goal 3: 3. Prior to discharge, patient will state at least 3 different coping strategies that patient can use to manage mental health symptoms, demonstrating improved likelihood to implement learned skills post discharge.  Progress: 0/3 stated  Plan: In Progress    I attest that I have reviewed the above information.  Signed by Allena Katz LRT/CTRS  Filed 04/08/2018

## 2018-04-08 NOTE — Unmapped (Signed)
[facility]  Psychiatry   Discharge Summary    Admit date and time: 03/31/2018  Discharge date and time: 04/08/2018  Discharge to: Group Home  Discharge Service: Psychiatry (PSY)  Discharge Attending Physician: Primitivo Gauze  COMMITMENT STATUS: voluntary      Reason for Admission  Kimberly Mathis is a 53 y.o. female who was admitted to the hospital on a  Involuntary  Basis for agitation/aggression and CAH. She says she was aggressive and broke things in the house at her group home. She was experiencing CAH, voices telling her to kill herself and if she doesn't hit them, beat them up or at least try they would kill me. She was tangential and hyper verbal. She says she does not get enough food or cigarettes and can't go back there. She makes several paranoid statements about her group home & her guardian. She had been attending a day program prior to Covid-19 but spends much more time at the group home currently due to social distancing requirements. She has a long history of inpatient psychiatric hospitalizations. Her last hospitalization was at [facility] for nearly two and a half years. She was discharged to her current group home and she's been living there for about 9 months. She says she takes her medications. Thought process is circumferential and she tends to focus on her ability to obtain cigarettes and food at the group home. Spoke with [name] from the [facility]. She says pt has been overall stable since arriving at current [facility] 9 months ago. She has been compliant with meds and follows up regularly with Dr. Mayford Knife at [facility]. Pt had gotten agitated with staff and tried to break her walker by slamming it on the ground. She threw a flower arrangement at [facility] staff and attempted to flip a table. She says pt usually has some agitation when she runs out of cigarettes but never to this extent. She provided ER staff with recent MAR. Currently getting cloazaril blood draws monthly & has been stable on current regimen since discharging from [facility] last June.  ??    Hospital Course:     On admission patients UDS was negative and BAL <10. She was noted to have a significant UTI with complaints of urinary frequency and burning and was started on Macrobid. She did complain of diarrhea r/t to antibiotic and was given PRN Immodium. Initially presenting to the unit patient was loud, disruptive and speech was pressured. She was seen by PT and recommended to use RW. Patients medications were re started on group home staff provided Imatinib for Chronic myelocytic leukemia. His VPA on admission was 53.4 and Depakote was increased to 500mg  daily and 1000mg  nightly for mood lability. Patient initially remained intermittently elevated but showed a good improvement with the increase in Depakote as well as the initiation of Macrobid for UTI. Her thought process was much more logical and linear. I did contact her guardian Peggye Fothergill for all medication adjustments and he provided consent. Patient reported she came to the hospital because she went crazy and stated she no longer felt like. She denied any further SI/HI/AVH. Her sleep was noted to be stable and was sleeping 6-8 hours nightly per nursing staff. Her VPA was rechecked following 5 day trough and returned at 72.3. Her bowel regimen was adjusted to PRN (colace and senna) given the complaints of diarrhea related to Macrobid. She was instructed to discuss any issues with constipation with her group home staff given the risks with  Clozaril and patient as well as group home staff voiced an understanding. Would benefit from re ordering these medications standing following her follow up with outpatient provider. Patients vital signs remained stable and she was continued on Metoprolol and Lisinopril. Clozapine level and metabolites ordered 3/29, results below. Patients Clozapine was not adjusted and can continue on monthly CBC w/ diffs, last one due 4/2. While on the unit patient did not display any aggressive or SIB. She required assistance from nursing staff for showering. She was much calmer although only attended 2 out of 21 recreational therapy groups offered during her admission. She was not isolative to her bedroom and was visible in the milieu.       Procedures:  ECG Results 04/07/18    EKG Atrial Rate: 101 BPM         EKG Calculated P Axis: 62 degrees         EKG Calculated R Axis: -30 degrees         EKG Calculated T Axis: 22 degrees         EKG P-R Interval: 164 ms         EKG Q-T Interval: 364 ms         EKG QRS Duration: 78 ms         EKG QTC Calculation: 471 ms         EKG Ventricular Rate: 101 BPM         QTC Fredericia: 433 ms         Narrative & Impression     Sinus tachycardia  Possible Left atrial enlargement  Left axis deviation  Septal infarct , age undetermined  Abnormal ECG     Lipid panel and hem a1c - drawn 3/31    Consultations:  PT consult - services not indicated - recommended use of RW which patient has at home    Condition at Time of Discharge:     Risk Assessment: On the day of discharge, the patient was evaluated by the psychiatric treatment provider and was discussed with the multidisciplinary treatment team. The treatment team has determined the patient to be stable and appropriate for discharge with medical approval. Day of discharge assessment included a suicide and violence risk assessment. Tthe treatment team has attempted to mitigate risk through supportive psychotherapy, providing psycho-education, thoughtful medication management, and communication with outpatient providers for continuity of care. The patient was educated about relevant modifiable risk factors including following recommendations for treatment of psychiatric illness and abstaining from substance abuse (as individually applicable).                 While future psychiatric events cannot be accurately predicted, the patient does not currently require further acute inpatient psychiatric care and does not currently meet [facility] involuntary commitment criteria.  It is recommended that the patient continue treatment in outpatient care. A follow up plan and crisis plan are in place, have been discussed with the patient, and the patient agrees to the plan at time of discharge.      Mental Status Examination:  Mood is euthymic. Denies suicidal or homicidal ideation. Denies auditory or visual hallucinations. Does not appear to be responding to internal stimuli.       Allergies:   No Known Allergies    Vitals:  Vitals:    04/07/18 0804 04/07/18 0815 04/07/18 1958 04/08/18 0735   BP: 133/89  125/88 147/95   Pulse: 64  103 104   Resp: 16  20  18   Temp: 36.2 ??C (97.1 ??F)  35.8 ??C (96.4 ??F) 36.1 ??C (97 ??F)   TempSrc:   Oral Oral   SpO2: (!) 84% 96% 96% 94%   Weight:       Height:             Recent Labs:    Results in Past 360 Days  Result Component Current Result   Hemoglobin A1C 5.6 (04/02/2018)   Triglycerides 92 (04/02/2018)   Cholesterol 115 (04/02/2018)   HDL 32 (L) (04/02/2018)   LDL Calculated 65 (04/02/2018)   VLDL Cholesterol Cal 18.4 (1/61/0960)   Chol/HDL Ratio 3.6 (04/02/2018)   Non-HDL Cholesterol 83 (04/02/2018)       Results for orders placed or performed during the hospital encounter of 03/31/18   Urine Culture   Result Value Ref Range    Urine Culture, Comprehensive MIXED ORGANISMS ISOLATED    Ethanol   Result Value Ref Range    Alcohol, Ethyl <20.0 <=20.0 mg/dL   Urinalysis with Culture Reflex   Result Value Ref Range    Color, UA Yellow     Clarity, UA Clear     Specific Gravity, UA >=1.030 (H) 1.016 - 1.022    pH, UA 5.5 5.0 - 8.0    Leukocyte Esterase, UA Negative Negative    Nitrite, UA Positive (A) Negative    Protein, UA Trace (A) Negative    Glucose, UA Negative Negative    Ketones, UA Trace (A) Negative    Urobilinogen, UA 0.2 mg/dL     Bilirubin, UA Small (A) Negative    Blood, UA Negative Negative    WBC, UA 2 0 - 2 /HPF    Squam Epithel, UA 3 0 - 5 /HPF    Bacteria, UA Moderate (A) None Seen /HPF   Drug Screen, Urine   Result Value Ref Range    Amphetamine Screen, Ur Negative Negative    Barbiturate Screen, Ur Negative Negative    Benzodiazepine Screen, Urine Negative Negative    Cocaine(Metab.)Screen, Urine Negative Negative    Cannabinoid Scrn, Ur Negative Negative    MDMA URINE Negative Negative    Methadone Metabolite Negative Negative    Methadone Screen, Urine Negative Negative    Opiate Scrn, Ur Negative Negative    Oxycodone Screen, Ur Negative Negative    PCP Screen, Urine Negative Negative    Fentanyl Screen, Urine Negative Negative   Pregnancy Qualitative, Urine   Result Value Ref Range    Pregnancy Test, Urine Negative Negative   Comprehensive Metabolic Panel   Result Value Ref Range    Sodium 143 136 - 145 mmol/L    Potassium 4.1 3.5 - 5.1 mmol/L    Chloride 106 98 - 107 mmol/L    Anion Gap 11 9 - 15 mmol/L    CO2 25.8 20.0 - 31.0 mmol/L    BUN 9 9 - 23 mg/dL    Creatinine 4.54 0.98 - 0.80 mg/dL    BUN/Creatinine Ratio 14     EGFR CKD-EPI Non-African American, Female >90 >=60 mL/min/1.67m2    EGFR CKD-EPI African American, Female >90 >=60 mL/min/1.81m2    Glucose 111 (H) 70 - 99 mg/dL    Calcium 11.9 8.3 - 14.7 mg/dL    Albumin 4.2 3.4 - 5.0 g/dL    Total Protein 7.4 5.7 - 8.2 g/dL    Total Bilirubin 0.2 (L) 0.3 - 1.2 mg/dL    AST 23 13 - 40 U/L    ALT 17  7 - 40 U/L    Alkaline Phosphatase 65 46 - 116 U/L   Valproic Acid Level   Result Value Ref Range    Valproic Acid, Total 53.4 50.0 - 100.0 ug/mL   Lipid panel (Non fasting)   Result Value Ref Range    Triglycerides 92 <150 mg/dL    Cholesterol 811 <=914 mg/dL    HDL 32 (L) 40 - 60 mg/dL    LDL Calculated 65 0 - 100 mg/dL    VLDL Cholesterol Cal 18.4 11 - 40 mg/dL    Chol/HDL Ratio 3.6 <7.8    Non-HDL Cholesterol 83 70 - 130 mg/dL    FASTING Unknown    Hemoglobin A1c   Result Value Ref Range    Hemoglobin A1C 5.6 4.8 - 5.6 %    Estimated Average Glucose 114 mg/dL   Clozapine Level and Metabolites   Result Value Ref Range    Clozapine Lvl 110 >350 ng/mL    NorClozapine 66 ng/mL    Total(Cloz+Norcloz) 176 >450 ng/mL   CBC   Result Value Ref Range    WBC 10.3 (H) 4.0 - 10.0 10*9/L    RBC 3.26 (L) 3.93 - 5.22 10*12/L    HGB 10.0 (L) 11.2 - 15.7 g/dL    HCT 29.5 (L) 62.1 - 44.9 %    MCV 92.6 79.4 - 94.8 fL    MCH 30.7 25.7 - 32.2 pg    MCHC 33.1 32.2 - 35.5 g/dL    RDW 30.8 (H) 65.7 - 14.4 %    MPV 9.8 9.4 - 12.4 fL    Platelet 289 182 - 369 10*9/L   Basic Metabolic Panel   Result Value Ref Range    Sodium 142 136 - 145 mmol/L    Potassium 3.7 3.5 - 5.1 mmol/L    Chloride 106 98 - 107 mmol/L    CO2 26.5 20.0 - 31.0 mmol/L    Anion Gap 10 9 - 15 mmol/L    BUN <5 (L) 9 - 23 mg/dL    Creatinine 8.46 (L) 0.50 - 0.80 mg/dL    EGFR CKD-EPI Non-African American, Female >90 >=60 mL/min/1.6m2    EGFR CKD-EPI African American, Female >90 >=60 mL/min/1.70m2    Glucose 96 70 - 99 mg/dL    Calcium 9.3 8.3 - 96.2 mg/dL   Urinalysis   Result Value Ref Range    Color, UA Yellow     Clarity, UA Cloudy     Specific Gravity, UA 1.018 1.016 - 1.022    pH, UA 5.0 5.0 - 8.0    Leukocyte Esterase, UA Negative Negative    Nitrite, UA Negative Negative    Protein, UA Negative Negative    Glucose, UA Negative Negative    Ketones, UA 20 mg/dL (A) Negative    Urobilinogen, UA 0.2 mg/dL     Bilirubin, UA Negative Negative    Blood, UA Negative Negative    RBC, UA 1 <4 /HPF    WBC, UA 1 0 - 2 /HPF    WBC Clumps None Seen None Seen /HPF    Squam Epithel, UA 10 (H) 0 - 5 /HPF    Yeast, UA None Seen None Seen /HPF    Bacteria, UA Few (A) None Seen /HPF    Hyphal Yeast None Seen None Seen /HPF    Mucus, UA Rare (A) None Seen /HPF   Valproic Acid Level   Result Value Ref Range    Valproic Acid, Total 72.3 50.0 -  100.0 ug/mL   Comprehensive Metabolic Panel   Result Value Ref Range    Sodium 141 136 - 145 mmol/L    Potassium 4.0 3.5 - 5.1 mmol/L    Chloride 103 98 - 107 mmol/L    Anion Gap 8 (L) 9 - 15 mmol/L    CO2 29.9 20.0 - 31.0 mmol/L    BUN 7 (L) 9 - 23 mg/dL    Creatinine 2.95 (L) 0.50 - 0.80 mg/dL    BUN/Creatinine Ratio 15     EGFR CKD-EPI Non-African American, Female >90 >=60 mL/min/1.80m2    EGFR CKD-EPI African American, Female >90 >=60 mL/min/1.79m2    Glucose 116 (H) 70 - 99 mg/dL    Calcium 62.1 8.3 - 30.8 mg/dL    Albumin 3.6 3.4 - 5.0 g/dL    Total Protein 6.2 5.7 - 8.2 g/dL    Total Bilirubin <6.5 (L) 0.3 - 1.2 mg/dL    AST 21 13 - 40 U/L    ALT 15 7 - 40 U/L    Alkaline Phosphatase 53 46 - 116 U/L   ECG 12 lead   Result Value Ref Range    EKG Systolic BP  mmHg    EKG Diastolic BP  mmHg    EKG Ventricular Rate 101 BPM    EKG Atrial Rate 101 BPM    EKG P-R Interval 164 ms    EKG QRS Duration 78 ms    EKG Q-T Interval 364 ms    EKG QTC Calculation 471 ms    EKG Calculated P Axis 62 degrees    EKG Calculated R Axis -30 degrees    EKG Calculated T Axis 22 degrees    QTC Fredericia 433 ms   CBC w/ Differential   Result Value Ref Range    WBC 12.2 (H) 4.0 - 10.0 10*9/L    RBC 3.52 (L) 3.93 - 5.22 10*12/L    HGB 10.6 (L) 11.2 - 15.7 g/dL    HCT 78.4 (L) 69.6 - 44.9 %    MCV 93.2 79.4 - 94.8 fL    MCH 30.1 25.7 - 32.2 pg    MCHC 32.3 32.2 - 35.5 g/dL    RDW 29.5 (H) 28.4 - 14.4 %    MPV 10.0 9.4 - 12.4 fL    Platelet 344 182 - 369 10*9/L    Neutrophils % 54.3 %    Immature Granulocytes Relative Percent 0.3 %    Lymphocytes % 36.6 %    Monocytes % 7.1 %    Eosinophils % 1.3 %    Basophils % 0.4 %    Absolute Neutrophils 6.6 (H) 1.6 - 6.1 10*9/L    Immature Granulocytes Absolute Count 0.0 0.0 - 1.0 10*9/L    Absolute Lymphocytes 4.5 (H) 1.3 - 3.6 10*9/L    Absolute Monocytes 0.9 (H) 0.2 - 0.4 10*9/L    Absolute Eosinophils 0.2 0.0 - 0.4 10*9/L    Absolute Basophils 0.1 0.0 - 0.1 10*9/L         Imaging Results:  No results found.     Discharge Medications:      Darica, Goren   Home Medication Instructions [id number]    Printed on:04/08/18 1110   Medication Information                      allopurinoL (ZYLOPRIM) 100 MG tablet  Take 1 tablet (100 mg total) by mouth daily.  B complex vitamins (B COMPLEX 1) tablet  Take 1 tablet by mouth daily.             clozapine (CLOZARIL) 100 MG tablet  Take 3 tablets (300 mg total) by mouth nightly.             cloZAPine (CLOZARIL) 50 MG tablet  Take 3 tablets (150 mg total) by mouth daily.             divalproex (DEPAKOTE) 500 MG DR tablet  Take 1 tablet (500 mg total) by mouth daily.             divalproex (DEPAKOTE) 500 MG DR tablet  Take 2 tablets (1,000 mg total) by mouth nightly.             docusate sodium (COLACE) 100 MG capsule  Take 1 capsule (100 mg total) by mouth daily as needed for constipation.             folic acid (FOLVITE) 1 MG tablet  Take 1 mg by mouth daily.             glycopyrrolate (ROBINUL) 1 mg tablet  Take 1 tablet (1 mg total) by mouth Two (2) times a day.             imatinib (GLEEVEC) 400 MG tablet  Take 1 tablet (400 mg total) by mouth daily.             lisinopriL (ZESTRIL) 2.5 MG tablet  Take 1 tablet (2.5 mg total) by mouth daily.             loperamide (IMODIUM) 2 mg capsule  Take 1 capsule (2 mg total) by mouth Three (3) times a day as needed for diarrhea.             metFORMIN (GLUCOPHAGE) 500 MG tablet  Take 2 tablets (1,000 mg total) by mouth daily.             metoprolol succinate (TOPROL-XL) 100 MG 24 hr tablet  Take 1 tablet (100 mg total) by mouth daily.             multivitamin with folic acid 400 mcg Tab tablet  Take 1 tablet by mouth daily.             pravastatin (PRAVACHOL) 10 MG tablet  Take 1 tablet (10 mg total) by mouth daily.             pyridoxine, vitamin B6, (B-6) 100 MG tablet  Take 200 mg by mouth daily.             ranitidine (ZANTAC) 150 MG tablet  Take 150 mg by mouth Two (2) times a day.             senna (SENOKOT) 8.6 mg tablet  Take 2 tablets by mouth nightly as needed for constipation.             zinc sulfate 220 mg Tab tablet  Take 1 tablet (220 mg total) by mouth daily.                 Discharge Diagnosis:    Patient Active Problem List   Diagnosis   ??? Type II diabetes mellitus (CMS-HCC)   ??? HTN (hypertension)   ??? Gout   ??? GERD (gastroesophageal reflux disease)   ??? Morbid obesity (CMS-HCC)   ??? Osteoporosis   ??? Schizoaffective disorder, bipolar type (CMS-HCC)   ??? CML (chronic myelocytic leukemia) (CMS-HCC)  Principal Problem:    Schizoaffective disorder, bipolar type (CMS-HCC)  Active Problems:    Type II diabetes mellitus (CMS-HCC)        Greater than 50% of session spent on counseling and coordination of care.  Treatment recommendations discussed.  30 minutes spent in total care of the patient.       Pending test Results:  None        Pending Labs     Order Current Status    ED Extra Tubes In process          Activity Instructions     As previously tolerated                 Aftercare plan:    Tobacco Cessation - A prescription for an FDA-approved cessation medication was offered at discharge and the patient refused    Substance Abuse Treatment - Patient does not have a substance abuse problem    Outlined in Plan Below    Appointments which have been scheduled for you    Apr 25, 2018 10:00 AM EDT  (Arrive by 9:30 AM)  LAB ONLY [place] with ADULT ONC LAB  [facility] ADULT ONCOLOGY LAB DRAW STATION [facility] REGION) [address]  [place] [address]  [phone]      Apr 25, 2018 11:00 AM EDT  (Arrive by 10:30 AM)  RETURN FOLLOW UP [place] with Lenon Ahmadi, AGNP  [facility] REGION) [address]  [place] [address]  [phone]       Additional instructions:      Pt has a medication management apt with Dr. Mayford Knife on 04/15/18 at 12:50pm  *this is a telephone apt*    [facility]  [address] [place] [address]    P: [phone]  F: [phone]    Pt has a smoking cessation session with Hilda Lias on 05/08/18 at 3:00pm    [facility]  Department Education Conference  [address] [place], [place] [address]  ??  Ph: [phone]  Fax: [phone]                  30 day e prescribed to [facility]    Primitivo Gauze, PMHNP  11:10 AM

## 2018-04-08 NOTE — Unmapped (Signed)
Recreational Therapy Treatment  04/07/2018     Patient Name:  Kimberly Mathis       Medical Record Number: [id number]   Date of Birth: [birth year]  Sex: Female          Room/Bed:  645/645-01    Tx Duration: 0 Min.        Assessment  Note Type: Contact Note  Contact Note: Pt did not attend recreational therapy group based on community reintegration.       Plan of Care  3x per day for: 5-7 days per week   Planned Treatment Duration Throughout admission.     Goals:  Goal 1: 1. By 04/09/18, patient will attend 3 recreational therapy treatment sessions for at least 25 minutes each with less than 2 cues, demonstrating improved motivation to RT treatment.  Progress: 1/3 attended  Plan: In Progress  Goal 2: 2. Within 7 RT leisure sessions, patient will demonstrate 3 leisure interests that can used as positive coping skills, demonstrating improved insight into managing stressors and symptoms related to current mental illness.    Progress: 1/3 demonstrated (dancing)  Plan: In Progress  Goal 3: 3. Prior to discharge, patient will state at least 3 different coping strategies that patient can use to manage mental health symptoms, demonstrating improved likelihood to implement learned skills post discharge.  Progress: 0/3 stated  Plan: In Progress     I attest that I have reviewed the above information.  Signed by Allena Katz LRT/CTRS  Filed 04/07/2018

## 2018-04-08 NOTE — Unmapped (Signed)
Problem: Adult Behavioral Health Plan of Care  Goal: Plan of Care Review  Outcome: Progressing  Goal: Patient-Specific Goal (Individualization)  Outcome: Progressing  Goal: Adheres to Safety Considerations for Self and Others  Outcome: Progressing  Goal: Optimized Coping Skills in Response to Life Stressors  Outcome: Progressing  Goal: Develops/Participates in Therapeutic Alliance to Support Successful Transition  Outcome: Progressing  Goal: Rounds/Family Conference  Outcome: Progressing     Problem: Self-Care Deficit  Goal: Improved Ability to Complete Activities of Daily Living  Outcome: Progressing     Problem: Fall Injury Risk  Goal: Absence of Fall and Fall-Related Injury  Outcome: Progressing     Problem: Skin Injury Risk Increased  Goal: Skin Health and Integrity  Outcome: Progressing     Problem: Diabetes Comorbidity  Goal: Blood Glucose Level Within Desired Range  Outcome: Progressing     Problem: Thought Process Alteration  Goal: Optimal Thought Clarity  Outcome: Progressing   Patient displays an appropriate affect, calm mood. Patient denies SI/HI and AVH. Patient has been increasingly demanding and yells out from her room for staff to bring her various things. Patient is complaint with medications and is encouraged to attend groups. Patient denies any loose stools or nausea this morning. Patient continues to be somewhat tangential and disorganized in conversation as well as irritable. Patient is a tentative discharge for today. Q 15 minute safety checks continue.

## 2018-04-08 NOTE — Unmapped (Signed)
Recreational Therapy Treatment  04/07/2018     Patient Name:  Kimberly Mathis       Medical Record Number: [id number]   Date of Birth: [birth year]  Sex: Female          Room/Bed:  645/645-01    Tx Duration: 0 Min.   Missed Treatment Reason: Patient unwilling to participate    Assessment  Note Type: Contact Note  Contact Note: Pt did not attend recreational therapy group based on leisure interests.       Plan of Care  3x per day for: 5-7 days per week   Planned Treatment Duration Throughout admission.     Goals:  Goal 1: 1. By 04/09/18, patient will attend 3 recreational therapy treatment sessions for at least 25 minutes each with less than 2 cues, demonstrating improved motivation to RT treatment.  Progress: 1/3 attended  Plan: In Progress  Goal 2: 2. Within 7 RT leisure sessions, patient will demonstrate 3 leisure interests that can used as positive coping skills, demonstrating improved insight into managing stressors and symptoms related to current mental illness.    Progress: 1/3 demonstrated (dancing)  Plan: In Progress  Goal 3: 3. Prior to discharge, patient will state at least 3 different coping strategies that patient can use to manage mental health symptoms, demonstrating improved likelihood to implement learned skills post discharge.  Progress: 0/3 stated  Plan: In Progress    I attest that I have reviewed the above information.  Signed by Allena Katz LRT/CTRS  Filed 04/07/2018

## 2018-04-08 NOTE — Unmapped (Signed)
Problem: Adult Behavioral Health Plan of Care  Goal: Plan of Care Review  Outcome: Progressing  Goal: Patient-Specific Goal (Individualization)  Outcome: Progressing  Goal: Adheres to Safety Considerations for Self and Others  Outcome: Progressing  Goal: Optimized Coping Skills in Response to Life Stressors  Outcome: Progressing  Goal: Develops/Participates in Therapeutic Alliance to Support Successful Transition  Outcome: Progressing  Goal: Rounds/Family Conference  Outcome: Progressing     Problem: Self-Care Deficit  Goal: Improved Ability to Complete Activities of Daily Living  Outcome: Progressing     Problem: Fall Injury Risk  Goal: Absence of Fall and Fall-Related Injury  Outcome: Progressing     Problem: Skin Injury Risk Increased  Goal: Skin Health and Integrity  Outcome: Progressing     Problem: Diabetes Comorbidity  Goal: Blood Glucose Level Within Desired Range  Outcome: Progressing     Problem: Thought Process Alteration  Goal: Optimal Thought Clarity  Outcome: Progressing   Patient has been isolative and withdrawn to her room. Patient stood in her doorway ane requested staff bring her snack and medications to her room. No c/o si/hi. Patient was compliant with medication and appetite was good. No unpredictable behaviors. Plan of care continued.

## 2018-04-12 NOTE — Unmapped (Signed)
__________________     ED Observation Note - Finish    ED Observation Progress:   Pt is mildly agitated on re-evaluation. No respiratory distress. Normal strength throughout.   Additionally, please refer to other progress notes during the patient's clinical course    ED Observation Discharge Summary:  Clinical Course in the ED: Seen by psych. Plan for psych admission.   Final Diagnosis/Medical Decision Making: acute psychosis, agitation  Disposition: admit psych    Patient is discharged from ED Observation status on 04/01/18 at 08:15am   __________________

## 2018-04-23 NOTE — Unmapped (Signed)
[facility] Refill Coordination Note    Specialty Medication(s) to be Shipped:   Hematology/Oncology: imatinib 400 MG       Kimberly Mathis, DOB: 05/01/1965  Phone: [phone] (home)       All above HIPAA information was verified with patient's caregiver.     Completed refill call assessment today to schedule patient's medication shipment from the [facility] [phone]).       Specialty medication(s) and dose(s) confirmed: Regimen is correct and unchanged.   Changes to medications: Kimberly Mathis reports no changes at this time.  Changes to insurance: No  Questions for the pharmacist: No    Confirmed patient received Welcome Packet with first shipment. The patient will receive a drug information handout for each medication shipped and additional FDA Medication Guides as required.       DISEASE/MEDICATION-SPECIFIC INFORMATION        N/A    SPECIALTY MEDICATION ADHERENCE     Medication Adherence    Patient reported X missed doses in the last month:  0  Specialty Medication:  imatinib 400 MG  Patient is on additional specialty medications:  No  Patient is on more than two specialty medications:  No  Any gaps in refill history greater than 2 weeks in the last 3 months:  no  Demonstrates understanding of importance of adherence:  yes  Informant:  caregiver  Reliability of informant:  reliable  Support network for adherence:  home health agency  Confirmed plan for next specialty medication refill:  delivery by pharmacy          Refill Coordination    Has the Patients' Contact Information Changed:  No  Is the Shipping Address Different:  No     imatinib 400 MG  : 12 days of medicine on hand       SHIPPING     Shipping address confirmed in Epic.     Delivery Scheduled: Yes, Expected medication delivery date: 05/03/2018.     Medication will be delivered via Next Day Courier to the home address in Epic [place].    [name]   [facility] Specialty Technician

## 2018-05-02 MED FILL — IMATINIB 400 MG TABLET: 30 days supply | Qty: 30 | Fill #8 | Status: AC

## 2018-05-02 MED FILL — IMATINIB 400 MG TABLET: ORAL | 30 days supply | Qty: 30 | Fill #8

## 2018-05-15 NOTE — Unmapped (Signed)
Left vm with my direct contact info about scheduling lab appt within the next 1 to 2 weeks and f/u visit with [facility] following week.    Thanks,   Nicholaus Bloom

## 2018-05-21 NOTE — Unmapped (Signed)
[facility] Refill Coordination Note    Specialty Medication(s) to be Shipped:   Hematology/Oncology: Imatinib    Other medication(s) to be shipped: n/a     Kimberly Mathis, DOB: 04-06-1965  Phone: [phone] (home)       All above HIPAA information was verified with patient.     Completed refill call assessment today to schedule patient's medication shipment from the [facility] [phone]).       Specialty medication(s) and dose(s) confirmed: Regimen is correct and unchanged.   Changes to medications: Kimberly Mathis reports no changes at this time.  Changes to insurance: No  Questions for the pharmacist: No    Confirmed patient received Welcome Packet with first shipment. The patient will receive a drug information handout for each medication shipped and additional FDA Medication Guides as required.       DISEASE/MEDICATION-SPECIFIC INFORMATION        N/A    SPECIALTY MEDICATION ADHERENCE     Medication Adherence    Patient reported X missed doses in the last month:  0  Specialty Medication:  imatinib  Patient is on additional specialty medications:  No  Patient is on more than two specialty medications:  No  Any gaps in refill history greater than 2 weeks in the last 3 months:  no  Demonstrates understanding of importance of adherence:  yes  Informant:  caregiver  Support network for adherence:  home health agency                Imatinib 400mg : Patient has 14 days of medication on hand      SHIPPING     Shipping address confirmed in Epic.     Delivery Scheduled: Yes, Expected medication delivery date: 05/30/18.     Medication will be delivered via Next Day Courier to the home address in [place].    Olga Millers   [facility] Specialty Technician

## 2018-05-28 ENCOUNTER — Ambulatory Visit: Admit: 2018-05-28 | Discharge: 2018-05-29 | Payer: MEDICARE

## 2018-05-28 DIAGNOSIS — C9210 Chronic myeloid leukemia, BCR/ABL-positive, not having achieved remission: Principal | ICD-10-CM

## 2018-05-28 LAB — COMPREHENSIVE METABOLIC PANEL
ALBUMIN: 4.1 g/dL (ref 3.5–5.0)
ALT (SGPT): 12 U/L (ref <35)
ANION GAP: 8 mmol/L (ref 7–15)
AST (SGOT): 18 U/L (ref 17–47)
BILIRUBIN TOTAL: 0.2 mg/dL (ref 0.0–1.2)
BUN / CREAT RATIO: 13
CHLORIDE: 103 mmol/L (ref 98–107)
CO2: 29 mmol/L (ref 22.0–30.0)
CREATININE: 0.48 mg/dL — ABNORMAL LOW (ref 0.60–1.00)
EGFR CKD-EPI AA FEMALE: >90 mL/min/{1.73_m2} (ref >=60)
EGFR CKD-EPI NON-AA FEMALE: >90 mL/min/{1.73_m2} (ref >=60)
GLUCOSE RANDOM: 93 mg/dL (ref 70–179)
POTASSIUM: 4.1 mmol/L (ref 3.5–5.0)
PROTEIN TOTAL: 7.2 g/dL (ref 6.5–8.3)
SODIUM: 140 mmol/L (ref 135–145)

## 2018-05-28 LAB — CBC W/ AUTO DIFF
BASOPHILS ABSOLUTE COUNT: 0.1 10*9/L (ref 0.0–0.1)
BASOPHILS RELATIVE PERCENT: 0.4 %
EOSINOPHILS RELATIVE PERCENT: 1.5 %
HEMATOCRIT: 32.7 % — ABNORMAL LOW (ref 36.0–46.0)
HEMOGLOBIN: 10.4 g/dL — ABNORMAL LOW (ref 12.0–16.0)
LARGE UNSTAINED CELLS: 1 % (ref 0–4)
LYMPHOCYTES ABSOLUTE COUNT: 3.8 10*9/L (ref 1.5–5.0)
LYMPHOCYTES RELATIVE PERCENT: 31 %
MEAN CORPUSCULAR HEMOGLOBIN CONC: 31.9 g/dL (ref 31.0–37.0)
MEAN CORPUSCULAR HEMOGLOBIN: 30.4 pg (ref 26.0–34.0)
MONOCYTES ABSOLUTE COUNT: 0.8 10*9/L (ref 0.2–0.8)
MONOCYTES RELATIVE PERCENT: 6.7 %
NEUTROPHILS ABSOLUTE COUNT: 7.3 10*9/L (ref 2.0–7.5)
NEUTROPHILS RELATIVE PERCENT: 59.3 %
PLATELET COUNT: 330 10*9/L (ref 150–440)
RED BLOOD CELL COUNT: 3.43 10*12/L — ABNORMAL LOW (ref 4.00–5.20)
RED CELL DISTRIBUTION WIDTH: 14.7 % (ref 12.0–15.0)
WBC ADJUSTED: 12.3 10*9/L — ABNORMAL HIGH (ref 4.5–11.0)

## 2018-05-28 LAB — CREATININE: Creatinine:MCnc:Pt:Ser/Plas:Qn:: 0.48 — ABNORMAL LOW

## 2018-05-28 LAB — RED CELL DISTRIBUTION WIDTH: Lab: 14.7

## 2018-05-29 MED FILL — IMATINIB 400 MG TABLET: ORAL | 30 days supply | Qty: 30 | Fill #9

## 2018-05-29 MED FILL — IMATINIB 400 MG TABLET: 30 days supply | Qty: 30 | Fill #9 | Status: AC

## 2018-06-13 NOTE — Unmapped (Signed)
Hi,     June with [facility] contacted the Communication Center regarding the following:    - June says patient's roommate has a fever and is on her way to the ED. June does not know if patient has been exposed to COVID-19. Patient is scheduled for video visit tomorrow.    Please contact June at [phone].    Thanks in advance,    Laverna Peace  [facility] Cancer Communication Center   [phone]

## 2018-06-14 ENCOUNTER — Telehealth: Admit: 2018-06-14 | Discharge: 2018-06-15 | Payer: MEDICARE | Attending: Adult Health | Primary: Adult Health

## 2018-06-14 DIAGNOSIS — F250 Schizoaffective disorder, bipolar type: Secondary | ICD-10-CM

## 2018-06-14 DIAGNOSIS — C9210 Chronic myeloid leukemia, BCR/ABL-positive, not having achieved remission: Principal | ICD-10-CM

## 2018-06-14 NOTE — Unmapped (Signed)
[facility] Triage Note     Patient: Kimberly Mathis     Reason for call:  return call    Time call returned: [phone]     Phone Assessment: Spoke with June, she was calling to review patients home meds.     Triage Recommendations: Reviewed home meds with additions.     Patient Response: unknown     Outstanding tasks: Care team notification no further actions needed      Patient Pharmacy has been verified and primary pharmacy has been marked as preferred

## 2018-06-14 NOTE — Unmapped (Signed)
Left message with call back number. Patient need med. Rec. Completed.

## 2018-06-14 NOTE — Unmapped (Signed)
[facility] Follow-up      This patient visit was completed through the use of an audio/video or telephone encounter.    I spent 15 minutes on the audio/video with the patient. I spent an additional 10 minutes on pre- and post-visit activities.     The patient was physically located in [place] or a state in which I am permitted to provide care. The patient understood that s/he may incur co-pays and cost sharing, and agreed to the telemedicine visit. The visit was completed via phone and/or video, which was appropriate and reasonable under the circumstances given the patient's presentation at the time.    The patient has been advised of the potential risks and limitations of this mode of treatment (including, but not limited to, the absence of in-person examination) and has agreed to be treated using telemedicine. The patient's/patient's family's questions regarding telemedicine have been answered.     If the phone/video visit was completed in an ambulatory setting, the patient has also been advised to contact their provider???s office for worsening conditions, and seek emergency medical treatment and/or call 911 if the patient deems either necessary.    Visit conducted by: Video  Person contacted: Erlinda Hong phone number: [phone]  Is there someone else in the room? yes  What is the relationship? other - June, caregiver  Do you want the person here for the visit?  yes       Patient Name: Kimberly Mathis  Patient Age: 53 y.o.  Encounter Date: 06/14/2018    Primary Care Provider:  Marcy Siren, PA    Referring Physician:  Desiree Lucy, PA  [address]  [place], [place] [address]    Reason for visit:   F/u visit for [facility]    Assessment:  Ms. Chariti Havel is a 53 y.o. female with history of severe schizoaffective disorder and cpCML found incidentally on blood work without significant hyperleukocytosis. She began treatment on imatinib in 12/2015 with optimal response and meeting appropriate treatment milestones, except her one year PCR, which was slightly above goal now in a MMR. She presents for a follow up appointment.      Ms. Wilbert seems to be doing well.  PerJune, her caregiver, she is taking her imatinib daily without problems.  Ms. Godfrey denies that she has cancer or that she is taking a cancer medication.  Her labs are stable and she is appropriate to continue imatinib.  Her BCR abl was down to 0.008%, from 0.040%.  This is it's lowest since starting the medication.    Plan and Recommendations:  1. Cont. Imatinib 400 mg daily  2. RTC in 3 months with BCR ABL and clinic    Dr. Vertell Limber was available.    Markus Jarvis, AGPCNP-C, MSN, OCN  Nurse Practitioner  Hematologic Malignancies  [facility]  [phone] (phone)  [phone] (fax)  [email or web address]  06/14/2018      History of Present Illness:  Oncology History    Diagnosis: CML    SOKAL Score:    Presentation: asymptomatic; leukocytosis found on routine CBC    Presenting WBC Count: 17,400    Bone Marrow Biopsy: not performed as patient unable to tolerate procedure.    Cytogenetics:  Abnormal FISH:??An interphase FISH assay shows an abnormal signal pattern consistent with BCR/ABL1 fusion in 44% of the 100 cells scored.??Of note, these cells contain an atypical abnormal signal pattern consistent with BCR/ABL1 rearrangement and loss of  the ABL1/ASS1 region.    Treatment:  Imatinib 400 mg daily- 12/31/2015    12/31/15:  BCR-ABL1 p210 transcripts were detected at a level of 9.511 IS% ratio in blood.  BCR-ABL1 p190 transcripts were detected at a level of 3 in 100,000 cells in blood.    03/23/16:  BCR-ABL1 p210 transcripts were detected at a level of 0.702 IS % ratio in blood.    Normal FISH:  A BCR/ABL1 interphase FISH assay for the previously documented 9;22 translocation shows a normal signal pattern in 100% of the 200 nuclei scored     06/15/16:  BCR-ABL1 p210 transcripts were detected at a level of 0.272 IS % ratio in blood.    09/2016:  0.298 IS %    12/18: 0.241 IS%    03/22/17: 0.072%    06/21/17: 0.052%    09/27/17: 0.125%    01/03/18: 0.040%    05/28/18: 0.008%        CML (chronic myelocytic leukemia) (CMS-HCC)    12/28/2015 Initial Diagnosis     CML (chronic myelocytic leukemia) (RAF-HCC)       Interval History:  Doing well since being last.  She has cut down on smoking more; smoking about one pack a day. Today she reports that she doesn't have cancer and she's never been to a doctor for this.  She feels like her energy is good.  She is sleeping okay.  Ms. Ohnemus is a poor historian and tangential (baseline d/t severe psychiatric D/O).     Otherwise, she denies new constitutional symptoms such as anorexia, weight loss, night sweats or unexplained fevers.  Furthermore, she denies symptoms of marrow failure: unexplained bleeding or bruising, recurrent or unexplained intercurrent infections, dyspnea on exertion, lightheadedness, palpitations or chest pain.  There have been no new or unexplained pains or self-identified masses, swelling or enlarged lymph nodes.    Past Medical, Surgical and Family History were reviewed and pertinent updates were made in the Electronic Medical Record    Review of Systems:  Other than as reported above in the interim history, the other systems reviewed were unremarkable.    ECOG Performance Status: 1    Past Medical History:  Past Medical History:   Diagnosis Date   ??? GERD (gastroesophageal reflux disease)    ??? Gout    ??? HTN (hypertension)    ??? Morbid obesity (CMS-HCC)    ??? Osteoporosis    ??? Schizoaffective disorder, bipolar type (CMS-HCC)    ??? Type II diabetes mellitus (CMS-HCC)        Medications:    Current Outpatient Medications   Medication Sig Dispense Refill   ??? allopurinoL (ZYLOPRIM) 100 MG tablet Take 1 tablet (100 mg total) by mouth daily. 30 tablet 0   ??? B complex vitamins (B COMPLEX 1) tablet Take 1 tablet by mouth daily. 30 tablet 11   ??? clozapine (CLOZARIL) 100 MG tablet Take 3 tablets (300 mg total) by mouth nightly. 90 tablet 0   ??? cloZAPine (CLOZARIL) 50 MG tablet Take 3 tablets (150 mg total) by mouth daily. 90 tablet 0   ??? diclofenac sodium (VOLTAREN) 1 % gel      ??? divalproex (DEPAKOTE) 500 MG DR tablet Take 1 tablet (500 mg total) by mouth daily. 30 tablet 0   ??? divalproex (DEPAKOTE) 500 MG DR tablet Take 2 tablets (1,000 mg total) by mouth nightly. 60 tablet 0   ??? docusate sodium (COLACE) 100 MG capsule Take 100 mg by mouth Two (2) times  a day.     ??? famotidine (PEPCID) 40 MG tablet      ??? folic acid (FOLVITE) 1 MG tablet Take 1 mg by mouth daily.     ??? glycopyrrolate (ROBINUL) 1 mg tablet      ??? imatinib (GLEEVEC) 400 MG tablet Take 1 tablet (400 mg total) by mouth daily. 30 each 11   ??? lisinopriL (ZESTRIL) 2.5 MG tablet Take 1 tablet (2.5 mg total) by mouth daily. 30 tablet 0   ??? metFORMIN (GLUCOPHAGE) 500 MG tablet Take 2 tablets (1,000 mg total) by mouth daily. 60 tablet 0   ??? metoprolol succinate (TOPROL-XL) 100 MG 24 hr tablet Take 1 tablet (100 mg total) by mouth daily. 30 tablet 0   ??? multiple vitamin with vit K, adult, (MULTIPLE VITAMIN) 3,300 unit- 150 mcg/10 mL injection Infuse into a venous catheter.     ??? multivitamin capsule Take 1 capsule by mouth daily.     ??? pravastatin (PRAVACHOL) 10 MG tablet Take 1 tablet (10 mg total) by mouth daily. (Patient taking differently: Take 20 mg by mouth daily. ) 30 tablet 0   ??? pyridoxine, vitamin B6, (B-6) 100 MG tablet Take 200 mg by mouth daily.     ??? senna (SENOKOT) 8.6 mg tablet Take 1 tablet by mouth daily.     ??? zinc sulfate 220 mg Tab tablet Take 1 tablet (220 mg total) by mouth daily. 30 tablet 0     No current facility-administered medications for this visit.      Vital Signs:  BSA: There is no height or weight on file to calculate BSA.  There were no vitals filed for this visit.  Physical Exam:  LIMITED EXAM DUE TO ATTEMPT TO LIMIT CONTACT DUE TO CORONAVIRUS IN THE COMMUNITY    GENERAL:  The patient appears well and is in no acute distress  HEENT: No scleral icterus.  RESP: Breathing comfortably and unlabored speech.    NEURO: A&Ox4  PSYCH: Normal affect      Relevant Laboratory, radiology and pathology results:  No visits with results within 1 Day(s) from this visit.   Latest known visit with results is:   Appointment on 05/28/2018   Component Date Value Ref Range Status   ??? Collection 05/28/2018 Collected   Final   ??? Sodium 05/28/2018 140  135 - 145 mmol/L Final   ??? Potassium 05/28/2018 4.1  3.5 - 5.0 mmol/L Final   ??? Chloride 05/28/2018 103  98 - 107 mmol/L Final   ??? Anion Gap 05/28/2018 8  7 - 15 mmol/L Final   ??? CO2 05/28/2018 29.0  22.0 - 30.0 mmol/L Final   ??? BUN 05/28/2018 6* 7 - 21 mg/dL Final   ??? Creatinine 05/28/2018 0.48* 0.60 - 1.00 mg/dL Final   ??? BUN/Creatinine Ratio 05/28/2018 13   Final   ??? EGFR CKD-EPI Non-African American,* 05/28/2018 >90  >=60 mL/min/1.61m2 Final   ??? EGFR CKD-EPI African American, Fem* 05/28/2018 >90  >=60 mL/min/1.71m2 Final   ??? Glucose 05/28/2018 93  70 - 179 mg/dL Final   ??? Calcium 16/10/9602 9.3  8.5 - 10.2 mg/dL Final   ??? Albumin 54/09/8117 4.1  3.5 - 5.0 g/dL Final   ??? Total Protein 05/28/2018 7.2  6.5 - 8.3 g/dL Final   ??? Total Bilirubin 05/28/2018 0.2  0.0 - 1.2 mg/dL Final   ??? AST 14/78/2956 18  17 - 47 U/L Final   ??? ALT 05/28/2018 12  <35 U/L Final   ???  Alkaline Phosphatase 05/28/2018 63  38 - 126 U/L Final   ??? WBC 05/28/2018 12.3* 4.5 - 11.0 10*9/L Final   ??? RBC 05/28/2018 3.43* 4.00 - 5.20 10*12/L Final   ??? HGB 05/28/2018 10.4* 12.0 - 16.0 g/dL Final   ??? HCT 09/81/1914 32.7* 36.0 - 46.0 % Final   ??? MCV 05/28/2018 95.2  80.0 - 100.0 fL Final   ??? MCH 05/28/2018 30.4  26.0 - 34.0 pg Final   ??? MCHC 05/28/2018 31.9  31.0 - 37.0 g/dL Final   ??? RDW 78/29/5621 14.7  12.0 - 15.0 % Final   ??? MPV 05/28/2018 7.1  7.0 - 10.0 fL Final   ??? Platelet 05/28/2018 330  150 - 440 10*9/L Final   ??? Neutrophils % 05/28/2018 59.3  % Final   ??? Lymphocytes % 05/28/2018 31.0  % Final   ??? Monocytes % 05/28/2018 6.7  % Final   ??? Eosinophils % 05/28/2018 1.5  % Final   ??? Basophils % 05/28/2018 0.4  % Final   ??? Absolute Neutrophils 05/28/2018 7.3  2.0 - 7.5 10*9/L Final   ??? Absolute Lymphocytes 05/28/2018 3.8  1.5 - 5.0 10*9/L Final   ??? Absolute Monocytes 05/28/2018 0.8  0.2 - 0.8 10*9/L Final   ??? Absolute Eosinophils 05/28/2018 0.2  0.0 - 0.4 10*9/L Final   ??? Absolute Basophils 05/28/2018 0.1  0.0 - 0.1 10*9/L Final   ??? Large Unstained Cells 05/28/2018 1  0 - 4 % Final   ??? Macrocytosis 05/28/2018 Slight* Not Present Final   ??? Case Report 05/28/2018    Final                    Value:Molecular Genetics Report                         Case: [id number]                                 Authorizing Provider:  Windell Norfolk Zeidner,   Collected:           05/28/2018 1111                                     MD                                                                           Ordering Location:     [facility]     Received:            05/28/2018 1250              Pathologist:           Diamantina Monks, MD                                                     Specimen:    Blood                                                                                     ???  Specimen Type 05/28/2018    Final                    Value:Blood   ??? BCR/ABL1 p210 Assay 05/28/2018 Positive   Final   ??? BCR/ABL1 p210 IS% Ratio 05/28/2018 0.008   Final   ??? BCR/ABL1 p210 Assay Results 05/28/2018    Final                    Value:This result contains rich text formatting which cannot be displayed here.

## 2018-06-24 NOTE — Unmapped (Signed)
Hi,    Appointment has been scheduled. Patient Kimberly Mathis has been notified of appointment details and verbalized satisfaction with appointment date and time.     Upcoming appts scheduled on 9/9 labs and video visit with Zeidner     Thank you,  [name]  [facility] Cancer Communication Center  [phone]

## 2018-06-26 NOTE — Unmapped (Signed)
[facility] Refill Coordination Note    Specialty Medication(s) to be Shipped:   Hematology/Oncology: Imatinib        Kimberly Mathis, DOB: [birth year]  Phone: [phone] (home)       All above HIPAA information was verified with patient's caregiver.     Completed refill call assessment today to schedule patient's medication shipment from the [facility] [phone]).       Specialty medication(s) and dose(s) confirmed: Regimen is correct and unchanged.   Changes to medications: [name] reports no changes at this time.  Changes to insurance: No  Questions for the pharmacist: No    Confirmed patient received Welcome Packet with first shipment. The patient will receive a drug information handout for each medication shipped and additional FDA Medication Guides as required.       DISEASE/MEDICATION-SPECIFIC INFORMATION        N/A    SPECIALTY MEDICATION ADHERENCE     Medication Adherence    Patient reported X missed doses in the last month:  0  Specialty Medication:  imatinib 400 mg  Patient is on additional specialty medications:  No  Patient is on more than two specialty medications:  No  Any gaps in refill history greater than 2 weeks in the last 3 months:  no  Demonstrates understanding of importance of adherence:  yes  Informant:  caregiver  Reliability of informant:  reliable  Provider-estimated medication adherence level:  good  Patient is at risk for Non-Adherence:  No  Support network for adherence:  home health agency  Confirmed plan for next specialty medication refill:  delivery by pharmacy          Refill Coordination    Has the Patients' Contact Information Changed:  No  Is the Shipping Address Different:  No           imatinib 400 mg  : 10 days of medicine on hand       SHIPPING     Shipping address confirmed in Epic.     Delivery Scheduled: Yes, Expected medication delivery date: 07/03/2018.     Medication will be delivered via Next Day Courier to the home address in [employer] [place].    [name]   [facility] Shared [facility] Specialty Technician

## 2018-07-02 MED FILL — IMATINIB 400 MG TABLET: 30 days supply | Qty: 30 | Fill #10 | Status: AC

## 2018-07-02 MED FILL — IMATINIB 400 MG TABLET: ORAL | 30 days supply | Qty: 30 | Fill #10

## 2018-07-26 NOTE — Unmapped (Signed)
[facility] Refill Coordination Note    Specialty Medication(s) to be Shipped:   Hematology/Oncology: imatinib 400 MG       Kimberly Mathis, DOB: [birth year]  Phone: [phone] (home)       All above HIPAA information was verified with patient's caregiver.     Completed refill call assessment today to schedule patient's medication shipment from the [facility] [phone]).       Specialty medication(s) and dose(s) confirmed: Regimen is correct and unchanged.   Changes to medications: Colleen reports no changes at this time.  Changes to insurance: No  Questions for the pharmacist: No    Confirmed patient received Welcome Packet with first shipment. The patient will receive a drug information handout for each medication shipped and additional FDA Medication Guides as required.       DISEASE/MEDICATION-SPECIFIC INFORMATION        N/A    SPECIALTY MEDICATION ADHERENCE     Medication Adherence    Patient reported X missed doses in the last month: 0  Specialty Medication: imatinib 400 MG  Patient is on additional specialty medications: No  Patient is on more than two specialty medications: No  Any gaps in refill history greater than 2 weeks in the last 3 months: no  Demonstrates understanding of importance of adherence: yes  Informant: caregiver  Reliability of informant: reliable  Support network for adherence: home health agency  Confirmed plan for next specialty medication refill: delivery by pharmacy          Refill Coordination    Has the Patients' Contact Information Changed: No  Is the Shipping Address Different: No           imatinib 400 MG : 7 days of medicine on hand       SHIPPING     Shipping address confirmed in Epic.     Delivery Scheduled: Yes, Expected medication delivery date: 08/01/2018.     Medication will be delivered via Next Day Courier to the home address in [employer] [place].    [name]   [facility] Specialty Technician

## 2018-07-31 MED FILL — IMATINIB 400 MG TABLET: ORAL | 30 days supply | Qty: 30 | Fill #11

## 2018-07-31 MED FILL — IMATINIB 400 MG TABLET: 30 days supply | Qty: 30 | Fill #11 | Status: AC

## 2018-08-21 MED ORDER — IMATINIB 400 MG TABLET
Freq: Every day | ORAL | 11 refills | 30.00000 days | Status: CP
Start: 2018-08-21 — End: 2019-08-21
  Filled 2018-08-26: qty 30, 30d supply, fill #0

## 2018-08-21 NOTE — Unmapped (Signed)
[facility] Clinical Assessment & Refill Coordination Note    Kimberly Mathis, DOB: 01/11/1965  Phone: [phone] (home)     All above HIPAA information was verified with patient.     Specialty Medication(s):   Hematology/Oncology: Imatinib     Current Outpatient Medications   Medication Sig Dispense Refill   ??? allopurinoL (ZYLOPRIM) 100 MG tablet Take 1 tablet (100 mg total) by mouth daily. 30 tablet 0   ??? clozapine (CLOZARIL) 100 MG tablet Take 3 tablets (300 mg total) by mouth nightly. 90 tablet 0   ??? cloZAPine (CLOZARIL) 50 MG tablet Take 3 tablets (150 mg total) by mouth daily. 90 tablet 0   ??? diclofenac sodium (VOLTAREN) 1 % gel      ??? divalproex (DEPAKOTE) 500 MG DR tablet Take 1 tablet (500 mg total) by mouth daily. 30 tablet 0   ??? divalproex (DEPAKOTE) 500 MG DR tablet Take 2 tablets (1,000 mg total) by mouth nightly. 60 tablet 0   ??? docusate sodium (COLACE) 100 MG capsule Take 100 mg by mouth Two (2) times a day.     ??? famotidine (PEPCID) 40 MG tablet      ??? folic acid (FOLVITE) 1 MG tablet Take 1 mg by mouth daily.     ??? glycopyrrolate (ROBINUL) 1 mg tablet      ??? imatinib (GLEEVEC) 400 MG tablet Take 1 tablet (400 mg total) by mouth daily. 30 each 11   ??? lisinopriL (ZESTRIL) 2.5 MG tablet Take 1 tablet (2.5 mg total) by mouth daily. 30 tablet 0   ??? metFORMIN (GLUCOPHAGE) 500 MG tablet Take 2 tablets (1,000 mg total) by mouth daily. 60 tablet 0   ??? metoprolol succinate (TOPROL-XL) 100 MG 24 hr tablet Take 1 tablet (100 mg total) by mouth daily. 30 tablet 0   ??? multiple vitamin with vit K, adult, (MULTIPLE VITAMIN) 3,300 unit- 150 mcg/10 mL injection Infuse into a venous catheter.     ??? multivitamin capsule Take 1 capsule by mouth daily.     ??? pravastatin (PRAVACHOL) 10 MG tablet Take 1 tablet (10 mg total) by mouth daily. (Patient taking differently: Take 20 mg by mouth daily. ) 30 tablet 0   ??? pyridoxine, vitamin B6, (B-6) 100 MG tablet Take 200 mg by mouth daily.     ??? senna (SENOKOT) 8.6 mg tablet Take 1 tablet by mouth daily.     ??? zinc sulfate 220 mg Tab tablet Take 1 tablet (220 mg total) by mouth daily. 30 tablet 0     No current facility-administered medications for this visit.         Changes to medications: Kimberly Mathis reports no changes at this time.    No Known Allergies    Changes to allergies: No    SPECIALTY MEDICATION ADHERENCE     Imatinib 400 mg: 10 days of medicine on hand     Medication Adherence    Patient reported X missed doses in the last month: 0  Specialty Medication: imatinib  Support network for adherence: home health agency          Specialty medication(s) dose(s) confirmed: Regimen is correct and unchanged.     Are there any concerns with adherence? No    Adherence counseling provided? Not needed    CLINICAL MANAGEMENT AND INTERVENTION      Clinical Benefit Assessment:    Do you feel the medicine is effective or helping your condition? Yes    Clinical  Benefit counseling provided? Not needed    Adverse Effects Assessment:    Are you experiencing any side effects? No    Are you experiencing difficulty administering your medicine? No    Quality of Life Assessment:    How many days over the past month did your condition  keep you from your normal activities? For example, brushing your teeth or getting up in the morning. patient currently lives in a SNF.    Have you discussed this with your provider? Not needed    Therapy Appropriateness:    Is therapy appropriate? Yes, therapy is appropriate and should be continued    DISEASE/MEDICATION-SPECIFIC INFORMATION      N/A    PATIENT SPECIFIC NEEDS     ? Does the patient have any physical, cognitive, or cultural barriers? currently lives in a SNF.    ? Is the patient high risk? No     ? Does the patient require a Care Management Plan? No     ? Does the patient require physician intervention or other additional services (i.e. nutrition, smoking cessation, social work)? No      SHIPPING     Specialty Medication(s) to be Shipped:   Hematology/Oncology: Imatinib    Other medication(s) to be shipped: n/a     Changes to insurance: No    Delivery Scheduled: Yes, Expected medication delivery date: 08/27/18.  However, Rx request for refills was sent to the provider as there are none remaining.     Medication will be delivered via Next Day Courier to the confirmed home address in [facility].    The patient will receive a drug information handout for each medication shipped and additional FDA Medication Guides as required.  Verified that patient has previously received a Conservation officer, historic buildings.    All of the patient's questions and concerns have been addressed.    Kimberly Mathis  Anders Grant   [facility] Specialty Pharmacist

## 2018-08-26 MED FILL — IMATINIB 400 MG TABLET: 30 days supply | Qty: 30 | Fill #0 | Status: AC

## 2018-09-13 ENCOUNTER — Ambulatory Visit: Admit: 2018-09-13 | Discharge: 2018-09-14 | Payer: MEDICARE

## 2018-09-13 DIAGNOSIS — C9210 Chronic myeloid leukemia, BCR/ABL-positive, not having achieved remission: Secondary | ICD-10-CM

## 2018-09-13 LAB — COMPREHENSIVE METABOLIC PANEL
ALBUMIN: 4.3 g/dL (ref 3.5–5.0)
ALKALINE PHOSPHATASE: 59 U/L (ref 38–126)
ALT (SGPT): 12 U/L (ref <35)
ANION GAP: 9 mmol/L (ref 7–15)
AST (SGOT): 22 U/L (ref 17–47)
BILIRUBIN TOTAL: 0.3 mg/dL (ref 0.0–1.2)
BLOOD UREA NITROGEN: 7 mg/dL (ref 7–21)
BUN / CREAT RATIO: 13
CALCIUM: 9.4 mg/dL (ref 8.5–10.2)
CHLORIDE: 103 mmol/L (ref 98–107)
CO2: 29 mmol/L (ref 22.0–30.0)
CREATININE: 0.56 mg/dL — ABNORMAL LOW (ref 0.60–1.00)
EGFR CKD-EPI NON-AA FEMALE: >90 mL/min/{1.73_m2} (ref >=60)
GLUCOSE RANDOM: 107 mg/dL (ref 70–179)
POTASSIUM: 4.3 mmol/L (ref 3.5–5.0)
PROTEIN TOTAL: 7.4 g/dL (ref 6.5–8.3)
SODIUM: 141 mmol/L (ref 135–145)

## 2018-09-13 LAB — CBC W/ AUTO DIFF
BASOPHILS ABSOLUTE COUNT: 0.1 10*9/L (ref 0.0–0.1)
BASOPHILS RELATIVE PERCENT: 0.5 %
EOSINOPHILS ABSOLUTE COUNT: 0.3 10*9/L (ref 0.0–0.4)
EOSINOPHILS RELATIVE PERCENT: 2.2 %
HEMOGLOBIN: 11.2 g/dL — ABNORMAL LOW (ref 12.0–16.0)
LARGE UNSTAINED CELLS: 2 % (ref 0–4)
LYMPHOCYTES ABSOLUTE COUNT: 4.7 10*9/L (ref 1.5–5.0)
LYMPHOCYTES RELATIVE PERCENT: 42.1 %
MEAN CORPUSCULAR HEMOGLOBIN CONC: 32.2 g/dL (ref 31.0–37.0)
MEAN CORPUSCULAR HEMOGLOBIN: 30.9 pg (ref 26.0–34.0)
MEAN CORPUSCULAR VOLUME: 95.8 fL (ref 80.0–100.0)
MEAN PLATELET VOLUME: 8.1 fL (ref 7.0–10.0)
MONOCYTES ABSOLUTE COUNT: 0.6 10*9/L (ref 0.2–0.8)
MONOCYTES RELATIVE PERCENT: 5.4 %
NEUTROPHILS ABSOLUTE COUNT: 5.4 10*9/L (ref 2.0–7.5)
NEUTROPHILS RELATIVE PERCENT: 48.3 %
PLATELET COUNT: 282 10*9/L (ref 150–440)
RED CELL DISTRIBUTION WIDTH: 14.3 % (ref 12.0–15.0)

## 2018-09-13 LAB — EOSINOPHILS RELATIVE PERCENT: Lab: 2.2

## 2018-09-13 LAB — BILIRUBIN TOTAL: Bilirubin:MCnc:Pt:Ser/Plas:Qn:: 0.3

## 2018-09-16 NOTE — Unmapped (Signed)
I spoke with patient Kimberly Mathis to confirm appointments on the following date(s): appts moved to 10/1 per facility request    [name]

## 2018-09-20 NOTE — Unmapped (Signed)
[facility] Refill Coordination Note    Specialty Medication(s) to be Shipped:   Hematology/Oncology: imatinib 400 mg       Kimberly Mathis, DOB: [birth year]  Phone: [phone] (home)       All above HIPAA information was verified with patient.     Completed refill call assessment today to schedule patient's medication shipment from the [facility] [phone]).       Specialty medication(s) and dose(s) confirmed: Regimen is correct and unchanged.   Changes to medications: Divina reports no changes at this time.  Changes to insurance: No  Questions for the pharmacist: No    Confirmed patient received Welcome Packet with first shipment. The patient will receive a drug information handout for each medication shipped and additional FDA Medication Guides as required.       DISEASE/MEDICATION-SPECIFIC INFORMATION        N/A    SPECIALTY MEDICATION ADHERENCE     Medication Adherence    Patient reported X missed doses in the last month: 0  Specialty Medication: imatinib 400 mg  Patient is on additional specialty medications: No  Patient is on more than two specialty medications: No  Any gaps in refill history greater than 2 weeks in the last 3 months: no  Demonstrates understanding of importance of adherence: yes  Informant: patient  Reliability of informant: reliable  Provider-estimated medication adherence level: good  Support network for adherence: home health agency  Confirmed plan for next specialty medication refill: delivery by pharmacy          Refill Coordination    Has the Patients' Contact Information Changed: No  Is the Shipping Address Different: No           imatinib 400 mg  : 5 days of medicine on hand       SHIPPING     Shipping address confirmed in Epic.     Delivery Scheduled: Yes, Expected medication delivery date: 09/25/2018.     Medication will be delivered via Next Day Courier to the home address in [facility] [place].    [name]   [facility] Shared [facility] Specialty Technician

## 2018-09-24 MED FILL — IMATINIB 400 MG TABLET: ORAL | 30 days supply | Qty: 30 | Fill #1

## 2018-09-24 MED FILL — IMATINIB 400 MG TABLET: 30 days supply | Qty: 30 | Fill #1 | Status: AC

## 2018-10-03 ENCOUNTER — Other Ambulatory Visit: Admit: 2018-10-03 | Discharge: 2018-10-04 | Payer: MEDICARE

## 2018-10-03 ENCOUNTER — Ambulatory Visit: Admit: 2018-10-03 | Discharge: 2018-10-04 | Payer: MEDICARE

## 2018-10-03 DIAGNOSIS — C9210 Chronic myeloid leukemia, BCR/ABL-positive, not having achieved remission: Secondary | ICD-10-CM

## 2018-10-03 LAB — COMPREHENSIVE METABOLIC PANEL
ALBUMIN: 4.6 g/dL (ref 3.5–5.0)
ALT (SGPT): 15 U/L (ref <35)
ANION GAP: 15 mmol/L (ref 7–15)
AST (SGOT): 24 U/L (ref 14–38)
BILIRUBIN TOTAL: 0.4 mg/dL (ref 0.0–1.2)
BLOOD UREA NITROGEN: 10 mg/dL (ref 7–21)
BUN / CREAT RATIO: 17
CALCIUM: 9.9 mg/dL (ref 8.5–10.2)
CHLORIDE: 104 mmol/L (ref 98–107)
CO2: 24 mmol/L (ref 22.0–30.0)
CREATININE: 0.58 mg/dL — ABNORMAL LOW (ref 0.60–1.00)
EGFR CKD-EPI AA FEMALE: >90 mL/min/{1.73_m2} (ref >=60)
EGFR CKD-EPI NON-AA FEMALE: >90 mL/min/{1.73_m2} (ref >=60)
GLUCOSE RANDOM: 110 mg/dL (ref 70–179)
POTASSIUM: 4.3 mmol/L (ref 3.5–5.0)
PROTEIN TOTAL: 7.9 g/dL (ref 6.5–8.3)
SODIUM: 143 mmol/L (ref 135–145)

## 2018-10-03 LAB — CBC W/ AUTO DIFF
BASOPHILS ABSOLUTE COUNT: 0.1 10*9/L (ref 0.0–0.1)
BASOPHILS RELATIVE PERCENT: 0.6 %
EOSINOPHILS ABSOLUTE COUNT: 0.3 10*9/L (ref 0.0–0.4)
EOSINOPHILS RELATIVE PERCENT: 2.2 %
HEMATOCRIT: 34.2 % — ABNORMAL LOW (ref 36.0–46.0)
HEMOGLOBIN: 11.1 g/dL — ABNORMAL LOW (ref 12.0–16.0)
LARGE UNSTAINED CELLS: 2 % (ref 0–4)
LYMPHOCYTES ABSOLUTE COUNT: 5.5 10*9/L — ABNORMAL HIGH (ref 1.5–5.0)
LYMPHOCYTES RELATIVE PERCENT: 45.1 %
MEAN CORPUSCULAR HEMOGLOBIN CONC: 32.4 g/dL (ref 31.0–37.0)
MEAN CORPUSCULAR VOLUME: 95.4 fL (ref 80.0–100.0)
MEAN PLATELET VOLUME: 7.7 fL (ref 7.0–10.0)
MONOCYTES ABSOLUTE COUNT: 0.6 10*9/L (ref 0.2–0.8)
MONOCYTES RELATIVE PERCENT: 5 %
NEUTROPHILS ABSOLUTE COUNT: 5.6 10*9/L (ref 2.0–7.5)
NEUTROPHILS RELATIVE PERCENT: 45.4 %
PLATELET COUNT: 290 10*9/L (ref 150–440)
RED CELL DISTRIBUTION WIDTH: 15.2 % — ABNORMAL HIGH (ref 12.0–15.0)

## 2018-10-03 LAB — LARGE UNSTAINED CELLS: Lab: 2

## 2018-10-03 LAB — BUN / CREAT RATIO: Urea nitrogen/Creatinine:MRto:Pt:Ser/Plas:Qn:: 17

## 2018-10-03 NOTE — Unmapped (Signed)
Continue taking imatinib 400mg  daily.    You blood work looks great and we have your leukemia is a good response.      I will see you back in 3 months for a video appt and will have labs done the week before.

## 2018-10-06 NOTE — Unmapped (Signed)
[facility] Follow-up    Patient Name: Kimberly Mathis  Patient Age: 53 y.o.  Encounter Date: 10/03/2018    Primary Care Provider:  Marcy Siren, PA    Referring Physician:  Desiree Lucy, PA  [address]  [place],  [place] [address]    Reason for visit:   F/u visit for [facility]    Assessment:  Kimberly Mathis is a 53 y.o. female with history of severe schizoaffective disorder and cpCML found incidentally on blood work without significant hyperleukocytosis. She began treatment on imatinib in 12/2015 with optimal response and meeting appropriate treatment milestones, except her one year PCR, which was slightly above goal.  She is now in a MMR. She presents for a follow up appointment.      Kimberly Mathis is doing well and tolerating imatinib well without complications, side effects, or toxicity.  Per June, her caregiver, she is taking her imatinib daily without problems.  Kimberly Mathis is unable to tell me about her medications.  Her labs are stable and she is appropriate to continue imatinib.  Her BCR abl was down to 0.003%, and she is now in MR4.5.  This is it's lowest since starting the medication.    Plan and Recommendations:  - Cont. Imatinib 400 mg daily  - RTC in 3 months with BCR ABL lab check and telehealth visit week following.       Dr. Vertell Limber was available.    Arna Medici, AGNP-BC  Leukemia Research Nurse Practitioner  Hematology/Oncology Division  [facility]  10/03/2018    History of Present Illness:  Oncology History Overview Note   Diagnosis: CML    SOKAL Score:    Presentation: asymptomatic; leukocytosis found on routine CBC    Presenting WBC Count: 17,400    Bone Marrow Biopsy: not performed as patient unable to tolerate procedure.    Cytogenetics:  Abnormal FISH: An interphase FISH assay shows an abnormal signal pattern consistent with BCR/ABL1 fusion in 44% of the 100 cells scored. Of note, these cells contain an atypical abnormal signal pattern consistent with BCR/ABL1 rearrangement and loss of the ABL1/ASS1 region.    Treatment:  Imatinib 400 mg daily- 12/31/2015    12/31/15:  BCR-ABL1 p210 transcripts were detected at a level of 9.511 IS% ratio in blood.  BCR-ABL1 p190 transcripts were detected at a level of 3 in 100,000 cells in blood.    03/23/16:  BCR-ABL1 p210 transcripts were detected at a level of 0.702 IS % ratio in blood.    Normal FISH:  A BCR/ABL1 interphase FISH assay for the previously documented 9;22 translocation shows a normal signal pattern in 100% of the 200 nuclei scored     06/15/16:  BCR-ABL1 p210 transcripts were detected at a level of 0.272 IS % ratio in blood.    09/2016:  0.298 IS %    12/18: 0.241 IS%    03/22/17: 0.072%    06/21/17: 0.052%    09/27/17: 0.125%    01/03/18: 0.040%    05/28/18: 0.008%    09/13/18: 0.003%     CML (chronic myelocytic leukemia) (CMS-HCC)   12/28/2015 Initial Diagnosis    CML (chronic myelocytic leukemia) (RAF-HCC)       Interval History:  Doing well since being last.  She continues to smoke.  She is residing in a group home.  She reports today that her knees bother her a lot.  They have started using voltaren gel and she takes  tylenol.   Kimberly Mathis is a poor historian and tangential (baseline d/t severe psychiatric D/O).  Per June, her caregiver that accompanies her, she has not had any fevers/chills, new cough, n/v/d.     Otherwise, she denies new constitutional symptoms such as anorexia, weight loss, night sweats or unexplained fevers.  Furthermore, she denies symptoms of marrow failure: unexplained bleeding or bruising, recurrent or unexplained intercurrent infections, dyspnea on exertion, lightheadedness, palpitations or chest pain.  There have been no new or unexplained pains or self-identified masses, swelling or enlarged lymph nodes.    Past Medical, Surgical and Family History were reviewed and pertinent updates were made in the Electronic Medical Record    Review of Systems:  Other than as reported above in the interim history, the other systems reviewed were unremarkable.    ECOG Performance Status: 1    Past Medical History:  Past Medical History:   Diagnosis Date   ??? GERD (gastroesophageal reflux disease)    ??? Gout    ??? HTN (hypertension)    ??? Morbid obesity (CMS-HCC)    ??? Osteoporosis    ??? Schizoaffective disorder, bipolar type (CMS-HCC)    ??? Type II diabetes mellitus (CMS-HCC)        Medications:    Current Outpatient Medications   Medication Sig Dispense Refill   ??? glycopyrrolate (ROBINUL) 1 mg tablet Take 1 mg by mouth Two (2) times a day.     ??? allopurinoL (ZYLOPRIM) 100 MG tablet Take 1 tablet (100 mg total) by mouth daily. 30 tablet 0   ??? clozapine (CLOZARIL) 100 MG tablet Take 3 tablets (300 mg total) by mouth nightly. 90 tablet 0   ??? cloZAPine (CLOZARIL) 50 MG tablet Take 3 tablets (150 mg total) by mouth daily. 90 tablet 0   ??? diclofenac sodium (VOLTAREN) 1 % gel      ??? divalproex (DEPAKOTE) 500 MG DR tablet Take 1 tablet (500 mg total) by mouth daily. 30 tablet 0   ??? divalproex (DEPAKOTE) 500 MG DR tablet Take 2 tablets (1,000 mg total) by mouth nightly. (Patient taking differently: Take 750 mg by mouth nightly. ) 60 tablet 0   ??? docusate sodium (COLACE) 100 MG capsule Take 100 mg by mouth Two (2) times a day.     ??? famotidine (PEPCID) 40 MG tablet      ??? folic acid (FOLVITE) 1 MG tablet Take 1 mg by mouth daily.     ??? imatinib (GLEEVEC) 400 MG tablet Take 1 tablet (400 mg total) by mouth daily. 30 each 11   ??? lisinopriL (ZESTRIL) 2.5 MG tablet Take 1 tablet (2.5 mg total) by mouth daily. 30 tablet 0   ??? metFORMIN (GLUCOPHAGE) 500 MG tablet Take 2 tablets (1,000 mg total) by mouth daily. 60 tablet 0   ??? metoprolol succinate (TOPROL-XL) 100 MG 24 hr tablet Take 1 tablet (100 mg total) by mouth daily. 30 tablet 0   ??? multiple vitamin with vit K, adult, (MULTIPLE VITAMIN) 3,300 unit- 150 mcg/10 mL injection Infuse into a venous catheter.     ??? multivitamin capsule Take 1 capsule by mouth daily.     ??? pravastatin (PRAVACHOL) 10 MG tablet Take 1 tablet (10 mg total) by mouth daily. (Patient taking differently: Take 20 mg by mouth daily. ) 30 tablet 0   ??? pyridoxine, vitamin B6, (B-6) 100 MG tablet Take 200 mg by mouth daily.     ??? zinc sulfate 220 mg Tab tablet Take 1 tablet (  220 mg total) by mouth daily. 30 tablet 0     No current facility-administered medications for this visit.      Vital Signs:  BSA: 2.5 meters squared  Vitals:    10/03/18 1458   BP: 122/84   Pulse: 95   Resp: 16   Temp: 36.4 ??C (97.5 ??F)   SpO2: 95%     Physical Exam:  Constitutional: Resting, in no apparent distress  Eyes: PERRL. No scleral icterus or conjunctival injection.  Cardiovascular:  RRR.  S1, S2.  No murmurs, gallops or rubs.  No edema.  Respiratory:  Breathing is unlabored, and patient is speaking full sentences with ease.  CTAB. No rales, ronchi or crackles.    GI:  No distention or pain on palpation.  Bowel sounds are present.  No palpable masses.  Musculoskeletal:   Range of motion about the shoulder, elbow, hips and knees is grossly normal.  Ambulates with walker.   Skin:  No rashes, petechiae or purpura.  grossly intact.   Neurologic:  alert and oriented to person and place.  Gait steady with wakler  Psychiatric: Range of affect is appropriate.    Relevant Laboratory, radiology and pathology results:  Lab on 10/03/2018   Component Date Value Ref Range Status   ??? Sodium 10/03/2018 143  135 - 145 mmol/L Final   ??? Potassium 10/03/2018 4.3  3.5 - 5.0 mmol/L Final   ??? Chloride 10/03/2018 104  98 - 107 mmol/L Final   ??? Anion Gap 10/03/2018 15  7 - 15 mmol/L Final   ??? CO2 10/03/2018 24.0  22.0 - 30.0 mmol/L Final   ??? BUN 10/03/2018 10  7 - 21 mg/dL Final   ??? Creatinine 10/03/2018 0.58* 0.60 - 1.00 mg/dL Final   ??? BUN/Creatinine Ratio 10/03/2018 17   Final   ??? EGFR CKD-EPI Non-African American,* 10/03/2018 >90  >=60 mL/min/1.54m2 Final   ??? EGFR CKD-EPI African American, Fem* 10/03/2018 >90  >=60 mL/min/1.54m2 Final   ??? Glucose 10/03/2018 110  70 - 179 mg/dL Final   ??? Calcium 16/10/9602 9.9  8.5 - 10.2 mg/dL Final   ??? Albumin 54/09/8117 4.6  3.5 - 5.0 g/dL Final   ??? Total Protein 10/03/2018 7.9  6.5 - 8.3 g/dL Final   ??? Total Bilirubin 10/03/2018 0.4  0.0 - 1.2 mg/dL Final   ??? AST 14/78/2956 24  14 - 38 U/L Final   ??? ALT 10/03/2018 15  <35 U/L Final   ??? Alkaline Phosphatase 10/03/2018 58  38 - 126 U/L Final   ??? Collection 10/03/2018 Collected   Final   ??? WBC 10/03/2018 12.3* 4.5 - 11.0 10*9/L Final   ??? RBC 10/03/2018 3.58* 4.00 - 5.20 10*12/L Final   ??? HGB 10/03/2018 11.1* 12.0 - 16.0 g/dL Final   ??? HCT 21/30/8657 34.2* 36.0 - 46.0 % Final   ??? MCV 10/03/2018 95.4  80.0 - 100.0 fL Final   ??? MCH 10/03/2018 30.9  26.0 - 34.0 pg Final   ??? MCHC 10/03/2018 32.4  31.0 - 37.0 g/dL Final   ??? RDW 84/69/6295 15.2* 12.0 - 15.0 % Final   ??? MPV 10/03/2018 7.7  7.0 - 10.0 fL Final   ??? Platelet 10/03/2018 290  150 - 440 10*9/L Final   ??? Neutrophils % 10/03/2018 45.4  % Final   ??? Lymphocytes % 10/03/2018 45.1  % Final   ??? Monocytes % 10/03/2018 5.0  % Final   ??? Eosinophils % 10/03/2018 2.2  % Final   ???  Basophils % 10/03/2018 0.6  % Final   ??? Absolute Neutrophils 10/03/2018 5.6  2.0 - 7.5 10*9/L Final   ??? Absolute Lymphocytes 10/03/2018 5.5* 1.5 - 5.0 10*9/L Final   ??? Absolute Monocytes 10/03/2018 0.6  0.2 - 0.8 10*9/L Final   ??? Absolute Eosinophils 10/03/2018 0.3  0.0 - 0.4 10*9/L Final   ??? Absolute Basophils 10/03/2018 0.1  0.0 - 0.1 10*9/L Final   ??? Large Unstained Cells 10/03/2018 2  0 - 4 % Final   ??? Macrocytosis 10/03/2018 Slight* Not Present Final

## 2018-10-09 NOTE — Unmapped (Signed)
I spoke with transportation coordinator in regards to patient Kimberly Mathis to confirm appointments on the following date(s): Lab appt scheduled for 12/30 at [facility] and f/u with Oliver Hum on 1/7 as video visit    [name]

## 2018-11-11 NOTE — Unmapped (Signed)
[facility] Refill Coordination Note    Specialty Medication(s) to be Shipped:   Hematology/Oncology: Gleevec    Other medication(s) to be shipped: Kimberly Mathis, DOB: [birth year]  Phone: [phone] (home)       All above HIPAA information was verified with patient's caregiver.     Completed refill call assessment today to schedule patient's medication shipment from the [facility] [phone]).       Specialty medication(s) and dose(s) confirmed: Regimen is correct and unchanged.   Changes to medications: Kimberly Mathis reports no changes at this time.  Changes to insurance: No  Questions for the pharmacist: No    Confirmed patient received Welcome Packet with first shipment. The patient will receive a drug information handout for each medication shipped and additional FDA Medication Guides as required.       DISEASE/MEDICATION-SPECIFIC INFORMATION        N/A    SPECIALTY MEDICATION ADHERENCE     Medication Adherence    Patient reported X missed doses in the last month: 0  Specialty Medication: Imatinin 400mg   Patient is on additional specialty medications: No  Any gaps in refill history greater than 2 weeks in the last 3 months: no  Demonstrates understanding of importance of adherence: yes  Informant: caregiver  Reliability of informant: reliable  Support network for adherence: home health agency  Confirmed plan for next specialty medication refill: delivery by pharmacy  Refills needed for supportive medications: not needed          Imatinin 400mg . 7 days on hand      SHIPPING     Shipping address confirmed in Epic.     Delivery Scheduled: Yes, Expected medication delivery date: 11/14/2018.     Medication will be delivered via Next Day Courier to the prescription address in [facility] [place].    Kimberly Mathis Kimberly Mathis   [facility] Specialty Technician

## 2018-11-11 NOTE — Unmapped (Signed)
The [facility] has made a third and final attempt to reach this patient to refill the following medication:Imatinib.      We have left voicemails on the following phone numbers: [phone] ,[phone]  and have been unable to leave messages on the following phone numbers: [phone] .    Dates contacted: 10/26,11/04,11/09  Last scheduled delivery: 09/22    The patient may be at risk of non-compliance with this medication. The patient should call the [facility] at [phone] (option 4) to refill medication.    Antonietta Barcelona   [facility] Specialty Technician

## 2018-11-12 NOTE — Unmapped (Signed)
L/m for [facility] alerting him to the need to schedule delivery for pt's imatinib refill. Advised him to call [facility] at [phone] (option 4). Left Nurse Triage # for follow up or clarification if needed.

## 2018-11-13 MED FILL — IMATINIB 400 MG TABLET: ORAL | 30 days supply | Qty: 30 | Fill #2

## 2018-11-13 MED FILL — IMATINIB 400 MG TABLET: 30 days supply | Qty: 30 | Fill #2 | Status: AC

## 2018-12-03 NOTE — Unmapped (Signed)
[facility] Refill Coordination Note    Specialty Medication(s) to be Shipped:   Hematology/Oncology: Imatinib    Other medication(s) to be shipped: n/a     Blenda Nicely, DOB: 09-01-1965  Phone: [phone] (home)       All above HIPAA information was verified with patient's caregiver.     Completed refill call assessment today to schedule patient's medication shipment from the [facility] [phone]).       Specialty medication(s) and dose(s) confirmed: Regimen is correct and unchanged.   Changes to medications: [place]ba reports no changes at this time.  Changes to insurance: No  Questions for the pharmacist: No    Confirmed patient received Welcome Packet with first shipment. The patient will receive a drug information handout for each medication shipped and additional FDA Medication Guides as required.       DISEASE/MEDICATION-SPECIFIC INFORMATION        N/A    SPECIALTY MEDICATION ADHERENCE     Medication Adherence    Patient reported X missed doses in the last month: 0  Specialty Medication: Imatinib 400mg   Patient is on additional specialty medications: No  Informant: caregiver  Support network for adherence: home health agency                Imatinib 400 mg: 20 days of medicine on hand         SHIPPING     Shipping address confirmed in Epic.     Delivery Scheduled: Yes, Expected medication delivery date: 12/18/18.     Medication will be delivered via Next Day Courier to the prescription address in [employer] [place].    [name]   [facility] Specialty Technician

## 2018-12-17 MED FILL — IMATINIB 400 MG TABLET: 30 days supply | Qty: 30 | Fill #3 | Status: AC

## 2018-12-17 MED FILL — IMATINIB 400 MG TABLET: ORAL | 30 days supply | Qty: 30 | Fill #3

## 2018-12-30 ENCOUNTER — Ambulatory Visit: Admit: 2018-12-30 | Discharge: 2018-12-31 | Payer: MEDICARE

## 2018-12-30 DIAGNOSIS — C9210 Chronic myeloid leukemia, BCR/ABL-positive, not having achieved remission: Principal | ICD-10-CM

## 2018-12-30 LAB — CBC W/ AUTO DIFF
BASOPHILS ABSOLUTE COUNT: 0.1 10*9/L (ref 0.0–0.1)
BASOPHILS RELATIVE PERCENT: 0.4 %
EOSINOPHILS ABSOLUTE COUNT: 0.3 10*9/L (ref 0.0–0.4)
EOSINOPHILS RELATIVE PERCENT: 2.1 %
HEMATOCRIT: 34.4 % — ABNORMAL LOW (ref 36.0–46.0)
HEMOGLOBIN: 11 g/dL — ABNORMAL LOW (ref 12.0–16.0)
LARGE UNSTAINED CELLS: 1 % (ref 0–4)
LYMPHOCYTES ABSOLUTE COUNT: 5.1 10*9/L — ABNORMAL HIGH (ref 1.5–5.0)
LYMPHOCYTES RELATIVE PERCENT: 40.8 %
MEAN CORPUSCULAR HEMOGLOBIN: 30.4 pg (ref 26.0–34.0)
MEAN CORPUSCULAR VOLUME: 95.6 fL (ref 80.0–100.0)
MONOCYTES ABSOLUTE COUNT: 0.7 10*9/L (ref 0.2–0.8)
MONOCYTES RELATIVE PERCENT: 5.2 %
NEUTROPHILS ABSOLUTE COUNT: 6.3 10*9/L (ref 2.0–7.5)
NEUTROPHILS RELATIVE PERCENT: 50.2 %
PLATELET COUNT: 325 10*9/L (ref 150–440)
RED BLOOD CELL COUNT: 3.6 10*12/L — ABNORMAL LOW (ref 4.00–5.20)
RED CELL DISTRIBUTION WIDTH: 13.9 % (ref 12.0–15.0)
WBC ADJUSTED: 12.5 10*9/L — ABNORMAL HIGH (ref 4.5–11.0)

## 2018-12-30 LAB — COMPREHENSIVE METABOLIC PANEL
ALBUMIN: 4.1 g/dL (ref 3.5–5.0)
ALKALINE PHOSPHATASE: 71 U/L (ref 38–126)
ALT (SGPT): 13 U/L (ref <35)
ANION GAP: 5 mmol/L — ABNORMAL LOW (ref 7–15)
AST (SGOT): 18 U/L (ref 17–47)
BILIRUBIN TOTAL: 0.3 mg/dL (ref 0.0–1.2)
BLOOD UREA NITROGEN: 7 mg/dL (ref 7–21)
BUN / CREAT RATIO: 13
CALCIUM: 9.5 mg/dL (ref 8.5–10.2)
CHLORIDE: 105 mmol/L (ref 98–107)
CO2: 30 mmol/L (ref 22.0–30.0)
CREATININE: 0.53 mg/dL — ABNORMAL LOW (ref 0.60–1.00)
EGFR CKD-EPI AA FEMALE: >90 mL/min/{1.73_m2} (ref >=60)
EGFR CKD-EPI NON-AA FEMALE: >90 mL/min/{1.73_m2} (ref >=60)
POTASSIUM: 4.2 mmol/L (ref 3.5–5.0)
PROTEIN TOTAL: 7.4 g/dL (ref 6.5–8.3)
SODIUM: 140 mmol/L (ref 135–145)

## 2018-12-30 LAB — SODIUM: Sodium:SCnc:Pt:Ser/Plas:Qn:: 140

## 2018-12-30 LAB — LYMPHOCYTES ABSOLUTE COUNT: Lymphocytes:NCnc:Pt:Bld:Qn:Automated count: 5.1 — ABNORMAL HIGH

## 2019-01-07 NOTE — Unmapped (Signed)
[facility] Clinical Assessment & Refill Coordination Note    Kimberly Mathis, DOB: 03-05-1965  Phone: [phone] (home)     All above HIPAA information was verified with patient's caregiver, June .     Was a Nurse, learning disability used for this call? No    Specialty Medication(s):   Hematology/Oncology: Imatinib     Current Outpatient Medications   Medication Sig Dispense Refill   ??? allopurinoL (ZYLOPRIM) 100 MG tablet Take 1 tablet (100 mg total) by mouth daily. 30 tablet 0   ??? clozapine (CLOZARIL) 100 MG tablet Take 3 tablets (300 mg total) by mouth nightly. 90 tablet 0   ??? cloZAPine (CLOZARIL) 50 MG tablet Take 3 tablets (150 mg total) by mouth daily. 90 tablet 0   ??? diclofenac sodium (VOLTAREN) 1 % gel      ??? divalproex (DEPAKOTE) 500 MG DR tablet Take 1 tablet (500 mg total) by mouth daily. 30 tablet 0   ??? divalproex (DEPAKOTE) 500 MG DR tablet Take 2 tablets (1,000 mg total) by mouth nightly. (Patient taking differently: Take 750 mg by mouth nightly. ) 60 tablet 0   ??? docusate sodium (COLACE) 100 MG capsule Take 100 mg by mouth Two (2) times a day.     ??? famotidine (PEPCID) 40 MG tablet      ??? folic acid (FOLVITE) 1 MG tablet Take 1 mg by mouth daily.     ??? glycopyrrolate (ROBINUL) 1 mg tablet Take 1 mg by mouth Two (2) times a day.     ??? imatinib (GLEEVEC) 400 MG tablet Take 1 tablet (400 mg total) by mouth daily. 30 each 11   ??? lisinopriL (ZESTRIL) 2.5 MG tablet Take 1 tablet (2.5 mg total) by mouth daily. 30 tablet 0   ??? metFORMIN (GLUCOPHAGE) 500 MG tablet Take 2 tablets (1,000 mg total) by mouth daily. 60 tablet 0   ??? metoprolol succinate (TOPROL-XL) 100 MG 24 hr tablet Take 1 tablet (100 mg total) by mouth daily. 30 tablet 0   ??? multiple vitamin with vit K, adult, (MULTIPLE VITAMIN) 3,300 unit- 150 mcg/10 mL injection Infuse into a venous catheter.     ??? multivitamin capsule Take 1 capsule by mouth daily.     ??? pravastatin (PRAVACHOL) 10 MG tablet Take 1 tablet (10 mg total) by mouth daily. (Patient taking differently: Take 20 mg by mouth daily. ) 30 tablet 0   ??? pyridoxine, vitamin B6, (B-6) 100 MG tablet Take 200 mg by mouth daily.     ??? zinc sulfate 220 mg Tab tablet Take 1 tablet (220 mg total) by mouth daily. 30 tablet 0     No current facility-administered medications for this visit.         Changes to medications: Bralynn reports no changes at this time.    No Known Allergies    Changes to allergies: No    SPECIALTY MEDICATION ADHERENCE     Imatinib 400 mg: 7 days of medicine on hand     Medication Adherence    Patient reported X missed doses in the last month: 0  Specialty Medication: Gleevec  Patient is on additional specialty medications: No  Informant: caregiver  Reliability of informant: reliable  Support network for adherence: home health agency          Specialty medication(s) dose(s) confirmed: Regimen is correct and unchanged.     Are there any concerns with adherence? No    Adherence counseling provided? Not needed  CLINICAL MANAGEMENT AND INTERVENTION      Clinical Benefit Assessment:    Do you feel the medicine is effective or helping your condition? Yes    Clinical Benefit counseling provided? Not needed    Adverse Effects Assessment:    Are you experiencing any side effects? No    Are you experiencing difficulty administering your medicine? No    Quality of Life Assessment:    How many days over the past month did your condition  keep you from your normal activities? For example, brushing your teeth or getting up in the morning. 0    Have you discussed this with your provider? Not needed    Therapy Appropriateness:    Is therapy appropriate? Yes, therapy is appropriate and should be continued    DISEASE/MEDICATION-SPECIFIC INFORMATION      N/A    PATIENT SPECIFIC NEEDS     ? Does the patient have any physical, cognitive, or cultural barriers? No    ? Is the patient high risk? Yes, patient is taking oral chemotherapy. Appropriateness of therapy as been assessed.     ? Does the patient require a Care Management Plan? No     ? Does the patient require physician intervention or other additional services (i.e. nutrition, smoking cessation, social work)? No      SHIPPING     Specialty Medication(s) to be Shipped:   Hematology/Oncology: Imatinib    Other medication(s) to be shipped: n/a     Changes to insurance: No    Delivery Scheduled: Yes, Expected medication delivery date: 03/27/19.     Medication will be delivered via Next Day Courier to the confirmed prescription address in [facility].    The patient will receive a drug information handout for each medication shipped and additional FDA Medication Guides as required.  Verified that patient has previously received a Conservation officer, historic buildings.    All of the patient's questions and concerns have been addressed.    Neema Fluegge Vangie Bicker   [facility] Specialty Pharmacist

## 2019-01-09 ENCOUNTER — Telehealth: Admit: 2019-01-09 | Discharge: 2019-01-10 | Payer: MEDICARE

## 2019-01-09 DIAGNOSIS — C9210 Chronic myeloid leukemia, BCR/ABL-positive, not having achieved remission: Principal | ICD-10-CM

## 2019-01-09 NOTE — Unmapped (Signed)
I spent 20 minutes on the real-time audio and video/phone with the patient. I spent an additional 15 minutes on pre- and post-visit activities.     The patient was physically located in [place] or a state in which I am permitted to provide care. The patient and/or parent/guardian understood that s/he may incur co-pays and cost sharing, and agreed to the telemedicine visit. The visit was reasonable and appropriate under the circumstances given the patient's presentation at the time.    The patient and/or parent/guardian has been advised of the potential risks and limitations of this mode of treatment (including, but not limited to, the absence of in-person examination) and has agreed to be treated using telemedicine. The patient's/patient's family's questions regarding telemedicine have been answered.     If the visit was completed in an ambulatory setting, the patient and/or parent/guardian has also been advised to contact their provider???s office for worsening conditions, and seek emergency medical treatment and/or call 911 if the patient deems either necessary.    [facility] Follow-up    Patient Name: Kimberly Mathis  Patient Age: 54 y.o.  Encounter Date: 01/09/2019    Primary Care Provider:  Marcy Siren, PA    Referring Physician:  Desiree Lucy, PA  [address]  [place],  [place] [address]    Reason for visit:   F/u visit for [facility]    Assessment:  Kimberly Mathis is a 54 y.o. 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 She began treatment on imatinib in 12/2015 with optimal response and meeting appropriate treatment milestones, except her one year PCR, which was slightly above goal.  She is now in a MR4.5. She presents for a follow up appointment.      Kimberly Mathis is doing well and tolerating imatinib well without complications, side effects, or toxicity.  Per June, her caregiver, she is taking her imatinib daily without problems, not having missed any doses.     Her labs were drawn last week, but unfortunately a BCR/ABL was not drawn.  I have requested that she come back within the next week to have this drawn.  For now, until we get those results she will remain on Imatinib 400mg  daily.     Plan and Recommendations:  - continue imatinib 400mg  daily  - return within the week for a BCR/ABL lab draw  - RTC in 3 months with BCR ABL lab check      Dr. Vertell Limber was available.    Arna Medici, AGNP-BC  Leukemia Research Nurse Practitioner  Hematology/Oncology Division  [facility]  01/09/2019    History of Present Illness:  Oncology History Overview Note   Diagnosis: CML    SOKAL Score:    Presentation: asymptomatic; leukocytosis found on routine CBC    Presenting WBC Count: 17,400    Bone Marrow Biopsy: not performed as patient unable to tolerate procedure.    Cytogenetics:  Abnormal FISH: An interphase FISH assay shows an abnormal signal pattern consistent with BCR/ABL1 fusion in 44% of the 100 cells scored. Of note, these cells contain an atypical abnormal signal pattern consistent with BCR/ABL1 rearrangement and loss of the ABL1/ASS1 region.    Treatment:  Imatinib 400 mg daily- 12/31/2015    12/31/15:  BCR-ABL1 p210 transcripts were detected at a level of 9.511 IS% ratio in blood.  BCR-ABL1 p190 transcripts were detected at a level of 3 in  100,000 cells in blood.    03/23/16:  BCR-ABL1 p210 transcripts were detected at a level of 0.702 IS % ratio in blood.    Normal FISH:  A BCR/ABL1 interphase FISH assay for the previously documented 9;22 translocation shows a normal signal pattern in 100% of the 200 nuclei scored     06/15/16:  BCR-ABL1 p210 transcripts were detected at a level of 0.272 IS % ratio in blood.    09/2016:  0.298 IS %    12/18: 0.241 IS%    03/22/17: 0.072%    06/21/17: 0.052%    09/27/17: 0.125%    01/03/18: 0.040%    05/28/18: 0.008%    09/13/18: 0.003%     CML (chronic myelocytic leukemia) (CMS-HCC)   12/28/2015 Initial Diagnosis    CML (chronic myelocytic leukemia) (RAF-HCC)       Interval History:  Doing well since being last.  She continues to smoke and in fact states she's feeling better today since she got her cigarettes.  She is residing in a group home.  She reports that she joint pain and her knees continue to bother her.  She is taking tyelnol prn and using voltaren gel on her knees.  She hasn't had any recent falls.  She denies any n/v/d, no sob/cough.  No new rahes.   She did receive her first COVID vaccine injection yesterday.  Kimberly Mathis is a poor historian and tangential (baseline d/t severe psychiatric D/O).  Per June, her caregiver that accompanies her, she is tolerating her medication without difficulty    Otherwise, she denies new constitutional symptoms such as anorexia, weight loss, night sweats or unexplained fevers.  Furthermore, she denies symptoms of marrow failure: unexplained bleeding or bruising, recurrent or unexplained intercurrent infections, dyspnea on exertion, lightheadedness, palpitations or chest pain.  There have been no new or unexplained pains or self-identified masses, swelling or enlarged lymph nodes.    Past Medical, Surgical and Family History were reviewed and pertinent updates were made in the Electronic Medical Record    Review of Systems:  Other than as reported above in the interim history, the other systems reviewed were unremarkable.    ECOG Performance Status: 1    Past Medical History:  Past Medical History:   Diagnosis Date   ??? GERD (gastroesophageal reflux disease)    ??? Gout    ??? HTN (hypertension)    ??? Morbid obesity (CMS-HCC)    ??? Osteoporosis    ??? Schizoaffective disorder, bipolar type (CMS-HCC)    ??? Type II diabetes mellitus (CMS-HCC)        Medications:    Current Outpatient Medications   Medication Sig Dispense Refill   ??? allopurinoL (ZYLOPRIM) 100 MG tablet Take 1 tablet (100 mg total) by mouth daily. 30 tablet 0   ??? clozapine (CLOZARIL) 100 MG tablet Take 3 tablets (300 mg total) by mouth nightly. 90 tablet 0   ??? cloZAPine (CLOZARIL) 50 MG tablet Take 3 tablets (150 mg total) by mouth daily. 90 tablet 0   ??? diclofenac sodium (VOLTAREN) 1 % gel      ??? divalproex (DEPAKOTE) 500 MG DR tablet Take 1 tablet (500 mg total) by mouth daily. 30 tablet 0   ??? divalproex (DEPAKOTE) 500 MG DR tablet Take 2 tablets (1,000 mg total) by mouth nightly. (Patient taking differently: Take 750 mg by mouth nightly. ) 60 tablet 0   ??? docusate sodium (COLACE) 100 MG capsule Take 100 mg by mouth Two (2) times a  day.     ??? famotidine (PEPCID) 40 MG tablet      ??? folic acid (FOLVITE) 1 MG tablet Take 1 mg by mouth daily.     ??? glycopyrrolate (ROBINUL) 1 mg tablet Take 1 mg by mouth Two (2) times a day.     ??? imatinib (GLEEVEC) 400 MG tablet Take 1 tablet (400 mg total) by mouth daily. 30 each 11   ??? lisinopriL (ZESTRIL) 2.5 MG tablet Take 1 tablet (2.5 mg total) by mouth daily. 30 tablet 0   ??? metFORMIN (GLUCOPHAGE) 500 MG tablet Take 2 tablets (1,000 mg total) by mouth daily. 60 tablet 0   ??? metoprolol succinate (TOPROL-XL) 100 MG 24 hr tablet Take 1 tablet (100 mg total) by mouth daily. 30 tablet 0   ??? multiple vitamin with vit K, adult, (MULTIPLE VITAMIN) 3,300 unit- 150 mcg/10 mL injection Infuse into a venous catheter.     ??? multivitamin capsule Take 1 capsule by mouth daily.     ??? pravastatin (PRAVACHOL) 10 MG tablet Take 1 tablet (10 mg total) by mouth daily. (Patient taking differently: Take 20 mg by mouth daily. ) 30 tablet 0   ??? pyridoxine, vitamin B6, (B-6) 100 MG tablet Take 200 mg by mouth daily.     ??? zinc sulfate 220 mg Tab tablet Take 1 tablet (220 mg total) by mouth daily. 30 tablet 0     No current facility-administered medications for this visit.      Vital Signs:  BSA: There is no height or weight on file to calculate BSA.  There were no vitals filed for this visit.  Physical Exam:  Constitutional:  in no apparent distress  Eyes: symmetirical.  Respiratory: patient is speaking full sentences with ease.    Remainder of exam deferred due to COVID    Relevant Laboratory, radiology and pathology results:  No visits with results within 1 Day(s) from this visit.   Latest known visit with results is:   Appointment on 12/30/2018   Component Date Value Ref Range Status   ??? Sodium 12/30/2018 140  135 - 145 mmol/L Final   ??? Potassium 12/30/2018 4.2  3.5 - 5.0 mmol/L Final   ??? Chloride 12/30/2018 105  98 - 107 mmol/L Final   ??? Anion Gap 12/30/2018 5* 7 - 15 mmol/L Final   ??? CO2 12/30/2018 30.0  22.0 - 30.0 mmol/L Final   ??? BUN 12/30/2018 7  7 - 21 mg/dL Final   ??? Creatinine 12/30/2018 0.53* 0.60 - 1.00 mg/dL Final   ??? BUN/Creatinine Ratio 12/30/2018 13   Final   ??? EGFR CKD-EPI Non-African American,* 12/30/2018 >90  >=60 mL/min/1.42m2 Final   ??? EGFR CKD-EPI African American, Fem* 12/30/2018 >90  >=60 mL/min/1.1m2 Final   ??? Glucose 12/30/2018 105  70 - 179 mg/dL Final   ??? Calcium 09/81/1914 9.5  8.5 - 10.2 mg/dL Final   ??? Albumin 78/29/5621 4.1  3.5 - 5.0 g/dL Final   ??? Total Protein 12/30/2018 7.4  6.5 - 8.3 g/dL Final   ??? Total Bilirubin 12/30/2018 0.3  0.0 - 1.2 mg/dL Final   ??? AST 30/86/5784 18  17 - 47 U/L Final   ??? ALT 12/30/2018 13  <35 U/L Final   ??? Alkaline Phosphatase 12/30/2018 71  38 - 126 U/L Final   ??? WBC 12/30/2018 12.5* 4.5 - 11.0 10*9/L Final   ??? RBC 12/30/2018 3.60* 4.00 - 5.20 10*12/L Final   ??? HGB 12/30/2018 11.0* 12.0 -  16.0 g/dL Final   ??? HCT 16/10/9602 34.4* 36.0 - 46.0 % Final   ??? MCV 12/30/2018 95.6  80.0 - 100.0 fL Final   ??? MCH 12/30/2018 30.4  26.0 - 34.0 pg Final   ??? MCHC 12/30/2018 31.8  31.0 - 37.0 g/dL Final   ??? RDW 54/09/8117 13.9  12.0 - 15.0 % Final   ??? MPV 12/30/2018 7.8  7.0 - 10.0 fL Final   ??? Platelet 12/30/2018 325  150 - 440 10*9/L Final   ??? Neutrophils % 12/30/2018 50.2  % Final   ??? Lymphocytes % 12/30/2018 40.8  % Final   ??? Monocytes % 12/30/2018 5.2  % Final   ??? Eosinophils % 12/30/2018 2.1  % Final   ??? Basophils % 12/30/2018 0.4  % Final   ??? Absolute Neutrophils 12/30/2018 6.3  2.0 - 7.5 10*9/L Final   ??? Absolute Lymphocytes 12/30/2018 5.1* 1.5 - 5.0 10*9/L Final   ??? Absolute Monocytes 12/30/2018 0.7  0.2 - 0.8 10*9/L Final   ??? Absolute Eosinophils 12/30/2018 0.3  0.0 - 0.4 10*9/L Final   ??? Absolute Basophils 12/30/2018 0.1  0.0 - 0.1 10*9/L Final   ??? Large Unstained Cells 12/30/2018 1  0 - 4 % Final   ??? Macrocytosis 12/30/2018 Slight* Not Present Final   ??? Hypochromasia 12/30/2018 Slight* Not Present Final

## 2019-01-09 NOTE — Unmapped (Signed)
Pt confirmed VIDEO visit  Pt is at home and provider is ON campus

## 2019-01-13 NOTE — Unmapped (Signed)
I spoke with patient transport coordinator in regards to Kimberly Mathis to confirm appointments on the following date(s): lab appt for 1/12    [name]

## 2019-01-14 ENCOUNTER — Ambulatory Visit: Admit: 2019-01-14 | Discharge: 2019-01-15 | Payer: MEDICARE

## 2019-01-14 DIAGNOSIS — C9210 Chronic myeloid leukemia, BCR/ABL-positive, not having achieved remission: Principal | ICD-10-CM

## 2019-01-14 LAB — CBC W/ AUTO DIFF
BASOPHILS ABSOLUTE COUNT: 0.1 10*9/L (ref 0.0–0.1)
BASOPHILS RELATIVE PERCENT: 0.6 %
EOSINOPHILS ABSOLUTE COUNT: 0.3 10*9/L (ref 0.0–0.4)
EOSINOPHILS RELATIVE PERCENT: 1.9 %
HEMATOCRIT: 34.3 % — ABNORMAL LOW (ref 36.0–46.0)
HEMOGLOBIN: 11.1 g/dL — ABNORMAL LOW (ref 12.0–16.0)
LARGE UNSTAINED CELLS: 1 % (ref 0–4)
LYMPHOCYTES ABSOLUTE COUNT: 4.8 10*9/L (ref 1.5–5.0)
MEAN CORPUSCULAR HEMOGLOBIN CONC: 32.3 g/dL (ref 31.0–37.0)
MEAN CORPUSCULAR HEMOGLOBIN: 30.6 pg (ref 26.0–34.0)
MEAN CORPUSCULAR VOLUME: 94.7 fL (ref 80.0–100.0)
MEAN PLATELET VOLUME: 7.6 fL (ref 7.0–10.0)
MONOCYTES ABSOLUTE COUNT: 0.5 10*9/L (ref 0.2–0.8)
MONOCYTES RELATIVE PERCENT: 3.5 %
NEUTROPHILS ABSOLUTE COUNT: 7.4 10*9/L (ref 2.0–7.5)
PLATELET COUNT: 285 10*9/L (ref 150–440)
RED CELL DISTRIBUTION WIDTH: 13.6 % (ref 12.0–15.0)
WBC ADJUSTED: 13.2 10*9/L — ABNORMAL HIGH (ref 4.5–11.0)

## 2019-01-14 LAB — COMPREHENSIVE METABOLIC PANEL
ALBUMIN: 4.3 g/dL (ref 3.5–5.0)
ALKALINE PHOSPHATASE: 62 U/L (ref 38–126)
ALT (SGPT): 15 U/L (ref <35)
ANION GAP: 7 mmol/L (ref 7–15)
AST (SGOT): 21 U/L (ref 17–47)
BILIRUBIN TOTAL: 0.4 mg/dL (ref 0.0–1.2)
BLOOD UREA NITROGEN: 9 mg/dL (ref 7–21)
BUN / CREAT RATIO: 16
CALCIUM: 9.9 mg/dL (ref 8.5–10.2)
CO2: 30 mmol/L (ref 22.0–30.0)
CREATININE: 0.56 mg/dL — ABNORMAL LOW (ref 0.60–1.00)
EGFR CKD-EPI AA FEMALE: >90 mL/min/{1.73_m2} (ref >=60)
EGFR CKD-EPI NON-AA FEMALE: >90 mL/min/{1.73_m2} (ref >=60)
GLUCOSE RANDOM: 100 mg/dL (ref 70–179)
PROTEIN TOTAL: 7.5 g/dL (ref 6.5–8.3)
SODIUM: 138 mmol/L (ref 135–145)

## 2019-01-14 LAB — AST (SGOT): Aspartate aminotransferase:CCnc:Pt:Ser/Plas:Qn:: 21

## 2019-01-14 LAB — LYMPHOCYTES RELATIVE PERCENT: Lymphocytes/100 leukocytes:NFr:Pt:Bld:Qn:Automated count: 36.6

## 2019-01-16 DIAGNOSIS — C9210 Chronic myeloid leukemia, BCR/ABL-positive, not having achieved remission: Principal | ICD-10-CM

## 2019-01-23 NOTE — Unmapped (Signed)
Patient Kimberly Mathis was contacted today regarding getting scheduled with labs, Oliver Hum or Dr. Vertell Limber in the next 1-2 wks. Voicemail was left for patient with information to call back.     This is third message left about getting this patient scheduled

## 2019-01-28 NOTE — Unmapped (Signed)
Patient Kimberly Mathis was contacted today regarding appt scheduled for 2/1 with Oliver Hum as virtual visit. Voicemail was left for patient with information to call back.

## 2019-02-03 NOTE — Unmapped (Signed)
Spoke with Kimberly Mathis, group home administrator, that accompanies Kimberly Mathis to her appointments.  She did explain to me that Kimberly Mathis had recently gotten a new caregiver, and there was some confusion about her medications.  She believes there were approx 5 days that Kimberly Mathis missed her imatinib.      I explained to Kimberly Mathis that I would like to have a recheck done on her labs ASAP, and then I will call Kimberly Mathis for another telehealth visit after we get her repeat labs back.  She was in agreement with this plan.

## 2019-02-04 NOTE — Unmapped (Signed)
I spoke with patient Kimberly Mathis to confirm appointments on the following date(s): lab appt scheduled for tomorrow 2/1 and virtual with Oliver Hum on 2/14    [name]

## 2019-02-05 ENCOUNTER — Ambulatory Visit: Admit: 2019-02-05 | Discharge: 2019-02-06 | Payer: MEDICARE

## 2019-02-05 DIAGNOSIS — C9210 Chronic myeloid leukemia, BCR/ABL-positive, not having achieved remission: Principal | ICD-10-CM

## 2019-02-05 LAB — CBC W/ AUTO DIFF
BASOPHILS RELATIVE PERCENT: 0.5 %
EOSINOPHILS ABSOLUTE COUNT: 0.3 10*9/L (ref 0.0–0.4)
EOSINOPHILS RELATIVE PERCENT: 2.3 %
HEMATOCRIT: 35.5 % — ABNORMAL LOW (ref 36.0–46.0)
LARGE UNSTAINED CELLS: 2 % (ref 0–4)
LYMPHOCYTES ABSOLUTE COUNT: 4.4 10*9/L (ref 1.5–5.0)
LYMPHOCYTES RELATIVE PERCENT: 39.5 %
MEAN CORPUSCULAR HEMOGLOBIN CONC: 31.9 g/dL (ref 31.0–37.0)
MEAN CORPUSCULAR HEMOGLOBIN: 30.4 pg (ref 26.0–34.0)
MEAN CORPUSCULAR VOLUME: 95.2 fL (ref 80.0–100.0)
MEAN PLATELET VOLUME: 7.6 fL (ref 7.0–10.0)
MONOCYTES ABSOLUTE COUNT: 0.5 10*9/L (ref 0.2–0.8)
MONOCYTES RELATIVE PERCENT: 4.6 %
NEUTROPHILS ABSOLUTE COUNT: 5.7 10*9/L (ref 2.0–7.5)
NEUTROPHILS RELATIVE PERCENT: 51.7 %
PLATELET COUNT: 279 10*9/L (ref 150–440)
RED BLOOD CELL COUNT: 3.73 10*12/L — ABNORMAL LOW (ref 4.00–5.20)
RED CELL DISTRIBUTION WIDTH: 14.2 % (ref 12.0–15.0)
WBC ADJUSTED: 11.1 10*9/L — ABNORMAL HIGH (ref 4.5–11.0)

## 2019-02-05 LAB — COMPREHENSIVE METABOLIC PANEL
ALBUMIN: 4.2 g/dL (ref 3.5–5.0)
ALT (SGPT): 15 U/L (ref <35)
ANION GAP: 6 mmol/L — ABNORMAL LOW (ref 7–15)
AST (SGOT): 21 U/L (ref 17–47)
BILIRUBIN TOTAL: 0.3 mg/dL (ref 0.0–1.2)
BLOOD UREA NITROGEN: 11 mg/dL (ref 7–21)
BUN / CREAT RATIO: 19
CALCIUM: 9.9 mg/dL (ref 8.5–10.2)
CHLORIDE: 101 mmol/L (ref 98–107)
CO2: 32 mmol/L — ABNORMAL HIGH (ref 22.0–30.0)
CREATININE: 0.58 mg/dL — ABNORMAL LOW (ref 0.60–1.00)
EGFR CKD-EPI AA FEMALE: >90 mL/min/{1.73_m2} (ref >=60)
EGFR CKD-EPI NON-AA FEMALE: >90 mL/min/{1.73_m2} (ref >=60)
GLUCOSE RANDOM: 111 mg/dL (ref 70–179)
POTASSIUM: 4.3 mmol/L (ref 3.5–5.0)
PROTEIN TOTAL: 7.5 g/dL (ref 6.5–8.3)
SODIUM: 139 mmol/L (ref 135–145)

## 2019-02-05 LAB — LARGE UNSTAINED CELLS: Lab: 2

## 2019-02-05 LAB — CO2: Carbon dioxide:SCnc:Pt:Ser/Plas:Qn:: 32 — ABNORMAL HIGH

## 2019-02-21 NOTE — Unmapped (Signed)
I spoke with patient Kimberly Mathis to confirm appointments on the following date(s): appt scheduled for 3/17 with labs and Angie Claris Gower

## 2019-02-26 MED FILL — IMATINIB 400 MG TABLET: 30 days supply | Qty: 30 | Fill #4 | Status: AC

## 2019-02-26 MED FILL — IMATINIB 400 MG TABLET: ORAL | 30 days supply | Qty: 30 | Fill #4

## 2019-03-19 ENCOUNTER — Ambulatory Visit: Admit: 2019-03-19 | Discharge: 2019-03-20 | Payer: MEDICARE

## 2019-03-19 ENCOUNTER — Other Ambulatory Visit: Admit: 2019-03-19 | Discharge: 2019-03-20 | Payer: MEDICARE

## 2019-03-19 DIAGNOSIS — C9210 Chronic myeloid leukemia, BCR/ABL-positive, not having achieved remission: Principal | ICD-10-CM

## 2019-03-19 LAB — CBC W/ AUTO DIFF
BASOPHILS RELATIVE PERCENT: 0.6 %
EOSINOPHILS ABSOLUTE COUNT: 0.2 10*9/L (ref 0.0–0.4)
HEMATOCRIT: 33.4 % — ABNORMAL LOW (ref 36.0–46.0)
HEMOGLOBIN: 10.8 g/dL — ABNORMAL LOW (ref 12.0–16.0)
LARGE UNSTAINED CELLS: 1 % (ref 0–4)
LYMPHOCYTES ABSOLUTE COUNT: 4.5 10*9/L (ref 1.5–5.0)
LYMPHOCYTES RELATIVE PERCENT: 38.4 %
MEAN CORPUSCULAR HEMOGLOBIN CONC: 32.5 g/dL (ref 31.0–37.0)
MEAN CORPUSCULAR HEMOGLOBIN: 31.2 pg (ref 26.0–34.0)
MEAN CORPUSCULAR VOLUME: 96.1 fL (ref 80.0–100.0)
MEAN PLATELET VOLUME: 8 fL (ref 7.0–10.0)
MONOCYTES ABSOLUTE COUNT: 0.5 10*9/L (ref 0.2–0.8)
MONOCYTES RELATIVE PERCENT: 4.1 %
NEUTROPHILS ABSOLUTE COUNT: 6.4 10*9/L (ref 2.0–7.5)
NEUTROPHILS RELATIVE PERCENT: 54 %
PLATELET COUNT: 337 10*9/L (ref 150–440)
RED BLOOD CELL COUNT: 3.47 10*12/L — ABNORMAL LOW (ref 4.00–5.20)
RED CELL DISTRIBUTION WIDTH: 15.4 % — ABNORMAL HIGH (ref 12.0–15.0)
WBC ADJUSTED: 11.8 10*9/L — ABNORMAL HIGH (ref 4.5–11.0)

## 2019-03-19 LAB — COMPREHENSIVE METABOLIC PANEL
ALBUMIN: 4.2 g/dL (ref 3.5–5.0)
ALT (SGPT): 15 U/L (ref <35)
AST (SGOT): 23 U/L (ref 14–38)
BILIRUBIN TOTAL: 0.3 mg/dL (ref 0.0–1.2)
BLOOD UREA NITROGEN: 7 mg/dL (ref 7–21)
BUN / CREAT RATIO: 13
CALCIUM: 9.4 mg/dL (ref 8.5–10.2)
CHLORIDE: 105 mmol/L (ref 98–107)
CO2: 28 mmol/L (ref 22.0–30.0)
CREATININE: 0.53 mg/dL — ABNORMAL LOW (ref 0.60–1.00)
EGFR CKD-EPI AA FEMALE: >90 mL/min/{1.73_m2} (ref >=60)
EGFR CKD-EPI NON-AA FEMALE: >90 mL/min/{1.73_m2} (ref >=60)
GLUCOSE RANDOM: 116 mg/dL (ref 70–179)
POTASSIUM: 4.2 mmol/L (ref 3.5–5.0)
PROTEIN TOTAL: 7.4 g/dL (ref 6.5–8.3)
SODIUM: 142 mmol/L (ref 135–145)

## 2019-03-19 LAB — MONOCYTES RELATIVE PERCENT: Monocytes/100 leukocytes:NFr:Pt:Bld:Qn:Automated count: 4.1

## 2019-03-19 LAB — CREATININE: Creatinine:MCnc:Pt:Ser/Plas:Qn:: 0.53 — ABNORMAL LOW

## 2019-03-19 NOTE — Unmapped (Signed)
Labs collected via #23 butterfly & sent for analysis.  To next appt. Care provided by Y. Cheek, Phleb.

## 2019-03-20 NOTE — Unmapped (Signed)
[facility] Refill Coordination Note    Specialty Medication(s) to be Shipped:   Hematology/Oncology: Imatinib    Other medication(s) to be shipped: n/a     Kimberly Mathis, DOB: Jan 04, 1965  Phone: [phone] (home)       All above HIPAA information was verified with patient's caregiver, June     Was a translator used for this call? No    Completed refill call assessment today to schedule patient's medication shipment from the [facility] [phone]).       Specialty medication(s) and dose(s) confirmed: Regimen is correct and unchanged.   Changes to medications: Reed reports no changes at this time.  Changes to insurance: No  Questions for the pharmacist: No    Confirmed patient received Welcome Packet with first shipment. The patient will receive a drug information handout for each medication shipped and additional FDA Medication Guides as required.       DISEASE/MEDICATION-SPECIFIC INFORMATION        N/A    SPECIALTY MEDICATION ADHERENCE     Medication Adherence    Patient reported X missed doses in the last month: 0  Specialty Medication: Imatinib 400mg   Patient is on additional specialty medications: No  Informant: caregiver  Support network for adherence: home health agency                Imatinib 400 mg: 11 days of medicine on hand          SHIPPING     Shipping address confirmed in Epic.     Delivery Scheduled: Yes, Expected medication delivery date: 03/27/19.     Medication will be delivered via Next Day Courier to the prescription address in [employer] [place].    [name]   [facility] Specialty Technician

## 2019-03-20 NOTE — Unmapped (Signed)
Spoke to [employer]? To try and get a sooner appt for patient but she is unable to bring patient and stated that the 4.8 appt would work better for them

## 2019-03-26 MED FILL — IMATINIB 400 MG TABLET: 30 days supply | Qty: 30 | Fill #5 | Status: AC

## 2019-03-26 MED FILL — IMATINIB 400 MG TABLET: ORAL | 30 days supply | Qty: 30 | Fill #5

## 2019-04-02 ENCOUNTER — Emergency Department
Admit: 2019-04-02 | Discharge: 2019-04-02 | Disposition: A | Discharge status: Home / Self Care | Payer: MEDICARE | Attending: Emergency Medicine

## 2019-04-02 ENCOUNTER — Ambulatory Visit
Admit: 2019-04-02 | Discharge: 2019-04-02 | Disposition: A | Discharge status: Home / Self Care | Payer: MEDICARE | Attending: Emergency Medicine

## 2019-04-02 NOTE — Unmapped (Signed)
Attempted to call group home administrator again. No answer.

## 2019-04-02 NOTE — Unmapped (Signed)
Pt takes 650 mg po of tylenol. Order was for 975 mg po. Pt refuses to take more than 2 tylenol. Provider updated.

## 2019-04-02 NOTE — Unmapped (Signed)
Updated Michelle/Case Management as requested by provider. Pt continues ED without transport. [facility] administrator June has been contacted and will be here to transport pt from ED, has not arrived .

## 2019-04-02 NOTE — Unmapped (Signed)
Pt brought in via EMS from group home [facility]) c/o body aches and sore throat-the staff thought she was having chest pain and was given a nitro

## 2019-04-02 NOTE — Unmapped (Signed)
Called [facility] @ [phone] and no answer.

## 2019-04-02 NOTE — Unmapped (Signed)
Called June with [facility], to inquire when would arrive to ED to transport pt. Offered to transport pt with [facility] per MD, and June refuses. Per June unacceptable for June to transport pt during the night when it is not working hours. Alora RN/ED supervisor updated. Will update case management.

## 2019-04-02 NOTE — Unmapped (Signed)
ATTESTATION NOTE    SUPERVISING PHYSICIAN NOTE: I have personally evaluated this patient and discussed the patient's evaluation and management as documented by the advance practice provider. I reviewed the EKG(s) and/or x-rays(s), if any, along with their interpretation and agree with their findings unless otherwise noted. My findings confirm, and/or supplement their note as follows:       HPI Summary:  54 year old female presenting for complaint of generalized aches, sore throat, cough and chest tightness over the last several days.  Unsure of fevers.    Exam Summary:  Well-appearing female lying prone on stretcher.  Pulmonary exam unremarkable.  Cardiac exam limited by the fact that the patient will not allow auscultation on her anterior chest wall as it is a less comfortable position for her to lie supine.  Extremities warm and well-perfused.  Patient comfortably conversant.    Diagnostic Test Results: Negative Covid, negative strep, negative chest x-ray.    Clinical Impression/Medical Decision Making:  Well-appearing 54 year old female with history of psychiatric comorbidities presenting with multiple infectious complaints such as sore throat etc.  No suspicion for emergent underlying process based on evaluation, vitals, history.  Patient refusing some recommended diagnostics, however is overall well-appearing and has capacity to refuse care.      Plan of Care: Discharge    I personally supervised the NP/PA during his/her performance of the following procedures: None    Please excuse any typographical or spelling errors in this note as it was created partially with use of Dragon dictation software.

## 2019-04-02 NOTE — Unmapped (Signed)
Pt ready for discharge and signs discharge instructions. Pt is dressed with jacket on and ready for discharge. Pt refuses discharge vital signs. Pt does not give pain score number at discharge, per pt feel better. Pt ate 100% of breakfast meal. Pt escorted to front entrance via wheelchair, pt has own walker. Pt stands to get into private vehicle at front entrance, June waiting for pt. Pt has walker at discharge.

## 2019-04-02 NOTE — Unmapped (Signed)
June Oburu called ED for pt report. Per June Oburu pt was sent to ED for c/o chest pain and was given nitroglycerin by June Oburu prior to calling EMS. June Oburu transferred to provider/Dr. Mal Amabile.

## 2019-04-02 NOTE — Unmapped (Signed)
Pt is sitting on side of bed eating breakfast. Per pt throat is feeling better.

## 2019-04-02 NOTE — Unmapped (Signed)
Left Message with facility at [phone].  No answer, left message.

## 2019-04-02 NOTE — Unmapped (Signed)
Pt wakes easily. Updated pt, breakfast has been ordered. Pt request to continue sleeping and does not want breakfast or vital signs taken at this time. Pt is alert and oriented.

## 2019-04-02 NOTE — Unmapped (Signed)
Called [facility] [phone], name on card for administrator June Oburu. Voice message for return call to ED.

## 2019-04-02 NOTE — Unmapped (Signed)
This RN attempted to call group home administrator several times with no answer.

## 2019-04-02 NOTE — Unmapped (Signed)
Bed: 12  Expected date:   Expected time:   Means of arrival:   Comments:  EMS

## 2019-04-02 NOTE — Unmapped (Signed)
[facility] Emergency Department   Provider Note  Room: 12/12      History     Chief Complaint: Generalized Body Aches and Sore Throat -- as stated in triage    EMS is the historian.  Patient is poor historian given schizoaffective disorder      HPI: Kimberly Mathis is a 54 y.o. female With past medical history as documented below who presents for evaluation of body aches, chills, sore throat.  Patient reports onset of symptoms last evening.  States that she does not feel well.  Otherwise is a poor historian.  She does not have any known sick contacts.  No COVID-19 exposures.  Denies any chest pain or abdominal pain.  Denies any vomiting or diarrhea.  Patient's group home staff thought that the patient was having chest pain and administered nitro.         Onset: Last evening  Location: Generalized body aches, sore throat  Duration: Worsening since last evening  Character of pain: Aching  Aggravating/Alleviating factors: None  Radiation: None  Timing: Present since last evening  Severity: 10/10      ROS:   Constitutional: Significant for low-grade fever  HENT: Negative for lymphadenopathy  Eyes: Negative for visual changes  Cardiovascular: Negative for syncope  Respiratory: Negative for hemoptysis  Gastrointestinal: Negative for vomiting or diarrhea  Genitourinary: Negative for dysuria  Musculoskeletal: Negative for leg pain or swelling  Skin: Negative for rash  Neurological: Negative for seizures    See HPI, 10 point review of systems performed and all others found to be negative.     MEDICATIONS:   Patient's Medications   New Prescriptions    No medications on file   Previous Medications    ACETAMINOPHEN (TYLENOL) 325 MG TABLET    acetaminophen 325 mg tablet   TAKE 3 TABLETS BY MOUTH TWICE DAILY.    ALLOPURINOL (ZYLOPRIM) 100 MG TABLET    Take 1 tablet (100 mg total) by mouth daily.    CLOZAPINE (CLOZARIL) 100 MG TABLET    Take 3 tablets (300 mg total) by mouth nightly.    CLOZAPINE (CLOZARIL) 50 MG TABLET Take 3 tablets (150 mg total) by mouth daily.    DICLOFENAC SODIUM (VOLTAREN) 1 % GEL        DIVALPROEX (DEPAKOTE) 500 MG DR TABLET    Take 1 tablet (500 mg total) by mouth daily.    DIVALPROEX (DEPAKOTE) 500 MG DR TABLET    Take 2 tablets (1,000 mg total) by mouth nightly.    DOCUSATE SODIUM (COLACE) 100 MG CAPSULE    Take 100 mg by mouth Two (2) times a day.    FAMOTIDINE (PEPCID) 40 MG TABLET        FOLIC ACID (FOLVITE) 1 MG TABLET    Take 1 mg by mouth daily.    GLYCOPYRROLATE (ROBINUL) 1 MG TABLET    Take 1 mg by mouth Two (2) times a day.    IMATINIB (GLEEVEC) 400 MG TABLET    Take 1 tablet (400 mg total) by mouth daily.    LISINOPRIL (ZESTRIL) 2.5 MG TABLET    Take 1 tablet (2.5 mg total) by mouth daily.    LOPERAMIDE (IMODIUM) 2 MG CAPSULE    loperamide 2 mg capsule   Take 1 capsule 3 times a day by oral route as needed for 30 days.    METFORMIN (GLUCOPHAGE) 500 MG TABLET    Take 2 tablets (1,000 mg total) by mouth daily.    METOPROLOL  SUCCINATE (TOPROL-XL) 100 MG 24 HR TABLET    Take 1 tablet (100 mg total) by mouth daily.    MULTIPLE VITAMIN WITH VIT K, ADULT, (MULTIPLE VITAMIN) 3,300 UNIT- 150 MCG/10 ML INJECTION    Infuse into a venous catheter.    MULTIVITAMIN CAPSULE    Take 1 capsule by mouth daily.    PRAVASTATIN (PRAVACHOL) 10 MG TABLET    Take 1 tablet (10 mg total) by mouth daily.    PYRIDOXINE, VITAMIN B6, (B-6) 100 MG TABLET    Take 200 mg by mouth daily.    SENNOSIDES (SENOKOT EXTRA STRENGTH) 17.2 MG TAB    Continuous.    ZINC SULFATE 220 MG TAB TABLET    Take 1 tablet (220 mg total) by mouth daily.   Modified Medications    No medications on file   Discontinued Medications    No medications on file       ALLERGIES: No Known Allergies    PAST MEDICAL HISTORY:   Past Medical History:   Diagnosis Date   ??? GERD (gastroesophageal reflux disease)    ??? Gout    ??? HTN (hypertension)    ??? Morbid obesity (CMS-HCC)    ??? Osteoporosis    ??? Schizoaffective disorder, bipolar type (CMS-HCC)    ??? Type II diabetes mellitus (CMS-HCC)        PAST SURGICAL HISTORY: No past surgical history on file.    SOCIAL HISTORY:   Social History     Tobacco Use   ??? Smoking status: Former Smoker     Packs/day: 1.00     Types: Cigarettes   ??? Smokeless tobacco: Never Used   Substance Use Topics   ??? Alcohol use: Not Currently       FAMILY HISTORY: No family history on file.       ?  Physical Exam     ED Triage Vitals [04/01/19 2127]   Enc Vitals Group      BP 153/84      Heart Rate 94      SpO2 Pulse       Resp 16      Temp 37.7 ??C (99.9 ??F)      Temp Source Oral      SpO2 100 %      Weight (!) 130.2 kg (287 lb)      Height       Head Circumference       Peak Flow       Pain Score       Pain Loc       Pain Edu?       Excl. in GC?          Reviewed vital signs and nursing note as charted by RN.  CONSTITUTIONAL: Alert and oriented. Well-appearing; well-nourished; in no distress, non-toxic  HEAD: Normocephalic; atraumatic  ENT:   Eyes: Extraocular movements intact  Nose: No congestion.  Mouth/Throat: Bilateral tonsils are equal in size with no erythema or exudate. Uvula is midline, patient speaks in clear complete sentences. There is no evidence of RPA or PTA on exam. Mucous membranes are moist and pink. Airway is patent, patient tolerates own secretions  NECK: Supple, painless range of motion, no meningeal signs, no evidence of deep space infection  CARD: RR; no murmurs  RESP: Normal chest excursion without splinting or tachypnea; breath sounds clear and equal bilaterally; no wheezes, no rhonchi, no rales  ABD/GI: non-distended; soft, nontender, no rebound, no guarding, no reproducible pain  to deep palpation of the abdomen  BACK: The back appears normal, no midline tenderness or stepoff, there is no CVA tenderness  EXT: Normal ROM in all joints; non-tender to palpation; no cyanosis, no effusions, no edema, ambulates with a rolling walker at baseline  SKIN: Normal color for age and race; warm; dry; good turgor; no acute lesions noted NEURO: Cranial nerves grossly intact, no slurred or dysarthric speech; Moves all extremities equally; Motor and sensory function intact  PSYCH: The patient's mood and manner are appropriate. Grooming and personal hygiene are appropriate    Results     I personally reviewed the results in the chart as listed below  XR Chest Portable    (Results Pending)     No results found.  Labs Reviewed   RAPID INFLUENZA/RSV/COVID PCR - Normal    Narrative:     This test was performed using the Cepheid Xpert Xpress SARS-CoV-2/Flu/RSV assay, which has been validated by the CLIA-certified, CAP-inspected [facility]. FDA has granted Emergency Use Authorization for this test. Negative results do not preclude infection and should be interpreted along with clinical observations, patient history, and epidemiological information. Information for providers and patients may be found here:    For Providers: https://pope.com/  For Patients: BoilerBrush.com.cy   POCT RAPID STREP A - Normal   STREP A CULTURE     ?  Medical Decision Making     Kimberly Mathis is a 54 y.o. female presents as documented above for evaluation in the emergency room.  Patient presents for evaluation of generalized body aches, sore throat, cough, chest discomfort.  Patient with low-grade fever of 99.9 with out administration of medications by EMS prior to presentation.  Given multiple viral complaints including body aches, sore throat, low-grade temperature will obtain rapid strep as well as Covid and influenza screening.  Will obtain radiograph of the chest.  Patient's complaint of chest pain is not consistent with ACS at time of examination.  Low suspicion for ACS however, will obtain EKG in the emergency room.  Will administer Motrin for body aches and low-grade fever.  ?  Progress Notes     Patient had available prior records reviewed at today's presentation, including prior imaging and cardiac procedures if available.     Progress note: Patient's rapid strep screening as well as influenza, COVID-19 screening are negative in the emergency room.  Radiograph of the chest reviewed by myself as well as attending without significant change when compared to prior.  Patient refusing EKG as well as refusing viscous lidocaine Tylenol as well as Zofran medication.  Will discharge patient back to group home facility at this time as she is refusing all further treatment.    I discussed this case with my attending Dr. Lourdes Sledge, who agrees with plan and impression.  ?  ?  The patients questions were addressed and answered at length. Patient has no remaining questions at this time. Patient agrees to treatment and follow-up and will comply with all   treatment plan. Strict return precautions have been given. Patient verbalizes understanding and repeats back return precautions.    Disposition     Condition: Stable    Clinical Impression: Pharyngitis, myalgia    Final diagnoses:   Pharyngitis, unspecified etiology (Primary)               Arvil Chaco, PA-C  ?  ___  Portions of this record may be dictated using voice recognition software. Variances in spelling and vocabulary are  possible and unintentional. Some errors may not be recognized or corrected at time of dictation. Please see actual provider for questions of records.      Francisca December, [place]  04/01/19 [phone]

## 2019-04-02 NOTE — Unmapped (Signed)
Received call from June. Waiting at front entrance to ED. Per June not coming inside ED, requesting to bring pt out to meet June at front entrance to ED. Pt updated. Case Management Marcelino Duster updated. Pt ambulatory to bathroom. Pt has own walker.

## 2019-04-02 NOTE — Unmapped (Signed)
Pt ambulatory to bathroom and back to bedrest. Pt is alert and denies pain. Per pt, had a sore throat. Pt updated, breakfast ordered, and June Oburu will be here to transport pt from ED. Pt agrees.

## 2019-04-02 NOTE — Unmapped (Signed)
Called [place] dispatch [phone] to do welfare check at [facility] [address]. [place], [place]. ED has been unable to contact administrator to give pt report, pt is waiting for transport to [facility]. Provider and charge nurse updated, and agrees.

## 2019-04-02 NOTE — Unmapped (Signed)
Pt is sleeping. Respirations 18/min. Pt is waiting for transport from ED.

## 2019-04-02 NOTE — Unmapped (Signed)
Pt ambulatory to bathroom with walker, and back to bedrest.

## 2019-04-07 NOTE — Unmapped (Signed)
Negative COVID19 result received.  Patient notified via results known in hospital.

## 2019-04-10 ENCOUNTER — Other Ambulatory Visit: Admit: 2019-04-10 | Discharge: 2019-04-11 | Payer: MEDICARE

## 2019-04-10 ENCOUNTER — Ambulatory Visit: Admit: 2019-04-10 | Discharge: 2019-04-11 | Payer: MEDICARE

## 2019-04-10 DIAGNOSIS — C9210 Chronic myeloid leukemia, BCR/ABL-positive, not having achieved remission: Principal | ICD-10-CM

## 2019-04-10 LAB — CBC W/ AUTO DIFF
BASOPHILS ABSOLUTE COUNT: 0 10*9/L (ref 0.0–0.1)
BASOPHILS RELATIVE PERCENT: 0.4 %
EOSINOPHILS ABSOLUTE COUNT: 0.1 10*9/L (ref 0.0–0.4)
EOSINOPHILS RELATIVE PERCENT: 1.4 %
HEMATOCRIT: 34.1 % — ABNORMAL LOW (ref 36.0–46.0)
LARGE UNSTAINED CELLS: 1 % (ref 0–4)
LYMPHOCYTES ABSOLUTE COUNT: 3.7 10*9/L (ref 1.5–5.0)
LYMPHOCYTES RELATIVE PERCENT: 38.7 %
MEAN CORPUSCULAR HEMOGLOBIN CONC: 30.4 g/dL — ABNORMAL LOW (ref 31.0–37.0)
MEAN CORPUSCULAR HEMOGLOBIN: 29.7 pg (ref 26.0–34.0)
MEAN CORPUSCULAR VOLUME: 97.6 fL (ref 80.0–100.0)
MEAN PLATELET VOLUME: 7.7 fL (ref 7.0–10.0)
MONOCYTES ABSOLUTE COUNT: 0.7 10*9/L (ref 0.2–0.8)
MONOCYTES RELATIVE PERCENT: 6.7 %
NEUTROPHILS ABSOLUTE COUNT: 5 10*9/L (ref 2.0–7.5)
NEUTROPHILS RELATIVE PERCENT: 51.8 %
PLATELET COUNT: 374 10*9/L (ref 150–440)
RED CELL DISTRIBUTION WIDTH: 14.3 % (ref 12.0–15.0)
WBC ADJUSTED: 9.7 10*9/L (ref 4.5–11.0)

## 2019-04-10 LAB — COMPREHENSIVE METABOLIC PANEL
ALBUMIN: 4.5 g/dL (ref 3.5–5.0)
ALKALINE PHOSPHATASE: 56 U/L (ref 38–126)
ALT (SGPT): 11 U/L (ref <35)
ANION GAP: 12 mmol/L (ref 7–15)
BILIRUBIN TOTAL: 0.2 mg/dL (ref 0.0–1.2)
BLOOD UREA NITROGEN: 11 mg/dL (ref 7–21)
BUN / CREAT RATIO: 19
CALCIUM: 9.3 mg/dL (ref 8.5–10.2)
CHLORIDE: 103 mmol/L (ref 98–107)
CO2: 27 mmol/L (ref 22.0–30.0)
CREATININE: 0.59 mg/dL — ABNORMAL LOW (ref 0.60–1.00)
EGFR CKD-EPI AA FEMALE: >90 mL/min/{1.73_m2} (ref >=60)
EGFR CKD-EPI NON-AA FEMALE: >90 mL/min/{1.73_m2} (ref >=60)
GLUCOSE RANDOM: 94 mg/dL (ref 70–179)
POTASSIUM: 4.3 mmol/L (ref 3.5–5.0)
PROTEIN TOTAL: 7.7 g/dL (ref 6.5–8.3)
SODIUM: 142 mmol/L (ref 135–145)

## 2019-04-10 LAB — EGFR CKD-EPI AA FEMALE: Glomerular filtration rate/1.73 sq M.predicted.black:ArVRat:Pt:Ser/Plas/Bld:Qn:Creatinine-based formula (CKD-EPI): >90

## 2019-04-10 LAB — MEAN CORPUSCULAR HEMOGLOBIN: Erythrocyte mean corpuscular hemoglobin:EntMass:Pt:RBC:Qn:Automated count: 29.7

## 2019-04-10 NOTE — Unmapped (Signed)
Keep taking your Imatinib 400mg  daily.     I will call you with the results of the BCR/ABL when they come back next week.     Lab on 04/10/2019   Component Date Value Ref Range Status   ??? Sodium 04/10/2019 142  135 - 145 mmol/L Final   ??? Potassium 04/10/2019 4.3  3.5 - 5.0 mmol/L Final   ??? Chloride 04/10/2019 103  98 - 107 mmol/L Final   ??? Anion Gap 04/10/2019 12  7 - 15 mmol/L Final   ??? CO2 04/10/2019 27.0  22.0 - 30.0 mmol/L Final   ??? BUN 04/10/2019 11  7 - 21 mg/dL Final   ??? Creatinine 04/10/2019 0.59* 0.60 - 1.00 mg/dL Final   ??? BUN/Creatinine Ratio 04/10/2019 19   Final   ??? EGFR CKD-EPI Non-African American,* 04/10/2019 >90  >=60 mL/min/1.66m2 Final   ??? EGFR CKD-EPI African American, Fem* 04/10/2019 >90  >=60 mL/min/1.103m2 Final   ??? Glucose 04/10/2019 94  70 - 179 mg/dL Final   ??? Calcium 16/10/9602 9.3  8.5 - 10.2 mg/dL Final   ??? Albumin 54/09/8117 4.5  3.5 - 5.0 g/dL Final   ??? Total Protein 04/10/2019 7.7  6.5 - 8.3 g/dL Final   ??? Total Bilirubin 04/10/2019 0.2  0.0 - 1.2 mg/dL Final   ??? AST 14/78/2956 22  14 - 38 U/L Final   ??? ALT 04/10/2019 11  <35 U/L Final   ??? Alkaline Phosphatase 04/10/2019 56  38 - 126 U/L Final   ??? Collection 04/10/2019 Collected   Final   ??? WBC 04/10/2019 9.7  4.5 - 11.0 10*9/L Final   ??? RBC 04/10/2019 3.50* 4.00 - 5.20 10*12/L Final   ??? HGB 04/10/2019 10.4* 12.0 - 16.0 g/dL Final   ??? HCT 21/30/8657 34.1* 36.0 - 46.0 % Final   ??? MCV 04/10/2019 97.6  80.0 - 100.0 fL Final   ??? MCH 04/10/2019 29.7  26.0 - 34.0 pg Final   ??? MCHC 04/10/2019 30.4* 31.0 - 37.0 g/dL Final   ??? RDW 84/69/6295 14.3  12.0 - 15.0 % Final   ??? MPV 04/10/2019 7.7  7.0 - 10.0 fL Final   ??? Platelet 04/10/2019 374  150 - 440 10*9/L Final   ??? Neutrophils % 04/10/2019 51.8  % Final   ??? Lymphocytes % 04/10/2019 38.7  % Final   ??? Monocytes % 04/10/2019 6.7  % Final   ??? Eosinophils % 04/10/2019 1.4  % Final   ??? Basophils % 04/10/2019 0.4  % Final   ??? Absolute Neutrophils 04/10/2019 5.0  2.0 - 7.5 10*9/L Final   ??? Absolute Lymphocytes 04/10/2019 3.7  1.5 - 5.0 10*9/L Final   ??? Absolute Monocytes 04/10/2019 0.7  0.2 - 0.8 10*9/L Final   ??? Absolute Eosinophils 04/10/2019 0.1  0.0 - 0.4 10*9/L Final   ??? Absolute Basophils 04/10/2019 0.0  0.0 - 0.1 10*9/L Final   ??? Large Unstained Cells 04/10/2019 1  0 - 4 % Final   ??? Macrocytosis 04/10/2019 Slight* Not Present Final   ??? Hypochromasia 04/10/2019 Slight* Not Present Final

## 2019-04-10 NOTE — Unmapped (Signed)
Labs drawn peripherally by Yvette Cheek CST.

## 2019-04-16 NOTE — Unmapped (Signed)
[facility] Refill Coordination Note    Specialty Medication(s) to be Shipped:   Hematology/Oncology: Imatinib    Other medication(s) to be shipped: n/a     Kimberly Mathis, DOB: 06-06-1965  Phone: [phone] (home)       All above HIPAA information was verified with patient's caregiver, June     Was a translator used for this call? No    Completed refill call assessment today to schedule patient's medication shipment from the [facility] [phone]).       Specialty medication(s) and dose(s) confirmed: Regimen is correct and unchanged.   Changes to medications: Teela reports no changes at this time.  Changes to insurance: No  Questions for the pharmacist: No    Confirmed patient received Welcome Packet with first shipment. The patient will receive a drug information handout for each medication shipped and additional FDA Medication Guides as required.       DISEASE/MEDICATION-SPECIFIC INFORMATION        N/A    SPECIALTY MEDICATION ADHERENCE     Medication Adherence    Patient reported X missed doses in the last month: 0  Specialty Medication: Imatinib 400mg   Patient is on additional specialty medications: No  Informant: caregiver  Support network for adherence: home health agency                Imatinib 400 mg: 12 days of medicine on hand         SHIPPING     Shipping address confirmed in Epic.     Delivery Scheduled: Yes, Expected medication delivery date: 04/24/19.     Medication will be delivered via Same Day Courier to the prescription address in Epic [place].    Kimberly Mathis Kimberly Mathis   [facility] Specialty Technician

## 2019-04-18 NOTE — Unmapped (Signed)
Triage RN returned call to June re pt who is presently at [facility] in ICU intubated after c/o chest pain and having a pericardial window procedure once arriving at the hospital yesterday.  June states that the ICU physician today was saying that he thought the leukemia may have played a part. The cell phone reception cut out during the call so the call ended.      RN has contacted Jiles Crocker, Intake Specialist to see if she can get records from [facility] as it is giving a error message that there is no matching patients.

## 2019-04-18 NOTE — Unmapped (Signed)
Hi,     Kimberly Mathis with [facility] contacted the Communication Center requesting to speak with the care team of Kimberly Mathis to discuss:    Wants to update the team on the patient's status who is currently admitted to hospital.    Please contact at [phone].    Program: Heme Malignancy  Speciality: Medical Oncology    Check Indicates criteria has been reviewed and confirmed with the patient:    []  Preferred Name   [x]  DOB and/or MR#  [x]  Preferred Contact Method  [x]  Phone Number(s)   []  MyChart     Thank you,   Vernie Ammons  [facility]   [phone]

## 2019-04-18 NOTE — Unmapped (Signed)
[facility] Follow-up    Patient Name: Kimberly Mathis  Patient Age: 54 y.o.  Encounter Date: 04/10/2019    Primary Care Provider:  Marcy Siren, PA    Referring Physician:  Desiree Lucy, PA  [address]  [address]  [place],  [place] [address]    Reason for visit:   F/u visit for [facility]    Assessment:  Ms. Kimberly Mathis is a 54 y.o. female with history of severe schizoaffective disorder and cpCML found incidentally on blood work without significant hyperleukocytosis. She began treatment on imatinib in 12/2015 with optimal response and meeting appropriate treatment milestones, except her one year PCR, which was slightly above goal.  She has been in a MMR and  MR4.0 since 05/2018 until Jan 2021 when her BCR/ABL increased.   She presents for a follow up appointment.      Ms. Kimberly Mathis is doing well and tolerating imatinib well without complications, side effects, or toxicity.  In January her BCR/ABL increased to 0.181% losing MMR.  The following month it was falling. Per June, her caregiver, she is taking her imatinib daily without problems, however there was a week in January where Ms. Kimberly Mathis had a new caregiver and there was some confusion regarding her medications.  She believes that Ms. Kimberly Mathis missed approx a week of her imatinib.   Ms. Kimberly Mathis is unable to tell me about her medications.  I will follow up on BCR/ABL pending from today and as long as decreasing further we will plan to follow up in 3 months.     Ms. Kimberly Mathis was seen in the ER last week.  She reports that she had SOB last week with pain in the center of her chest when she went to the ER and they said she had bronchitis.  Reviewing chart, CXR and EKG were WNL.  No further episodes reported.  Ms. Kimberly Mathis is fully vaccinated against COVID19     Plan and Recommendations:  - continue Imatinib 400 mg daily  - RTC in 3 months with BCR/ABL PCR lab check      Dr. Vertell Limber was available.    Arna Medici, AGNP-BC  Leukemia Research Nurse Practitioner  Hematology/Oncology Division  [facility]  04/10/2019    History of Present Illness:  Oncology History Overview Note   Diagnosis: CML    SOKAL Score:    Presentation: asymptomatic; leukocytosis found on routine CBC    Presenting WBC Count: 17,400    Bone Marrow Biopsy: not performed as patient unable to tolerate procedure.    Cytogenetics:  Abnormal FISH: An interphase FISH assay shows an abnormal signal pattern consistent with BCR/ABL1 fusion in 44% of the 100 cells scored. Of note, these cells contain an atypical abnormal signal pattern consistent with BCR/ABL1 rearrangement and loss of the ABL1/ASS1 region.    Treatment:  Imatinib 400 mg daily- 12/31/2015    12/31/15:  BCR-ABL1 p210 transcripts were detected at a level of 9.511 IS% ratio in blood.  BCR-ABL1 p190 transcripts were detected at a level of 3 in 100,000 cells in blood.    03/23/16:  BCR-ABL1 p210 transcripts were detected at a level of 0.702 IS % ratio in blood.    Normal FISH:  A BCR/ABL1 interphase FISH assay for the previously documented 9;22 translocation shows a normal signal pattern in 100% of the 200 nuclei scored     06/15/16:  BCR-ABL1 p210 transcripts were detected at a level of 0.272 IS % ratio in blood.  09/2016:  0.298 IS %    12/18: 0.241 IS%    03/22/17: 0.072%    06/21/17: 0.052%    09/27/17: 0.125%    01/03/18: 0.040%    05/28/18: 0.008%    09/13/18: 0.003%    01/14/2019: 0.181%  02/05/2019: 0.149%  04/10/2019: 0.037%       CML (chronic myelocytic leukemia) (CMS-HCC)   12/28/2015 Initial Diagnosis    CML (chronic myelocytic leukemia) (RAF-HCC)       Interval History:  Ms. Kimberly Mathis is doing roughly about the same.  She was seen in the local ER last week.  She states that she had some SOB and pain right in the center of her chest last week when she went to the ED.  She also tells me that they said she had bronchitis.  She denies any further sob or chest pain.  She denies any n/v/d.  She does report loose stools.  Ms. Kimberly Mathis continues to smoke.  She also continues to have arthralgias in her knees, back, and arms and ambulates with a walker.   She resides in a group home.  They continue to use voltaren gel and she takes tylenol prn for her joint pain.   Ms. Kimberly Mathis is a poor historian and tangential (baseline d/t severe psychiatric D/O).  Per June, her caregiver that accompanies her, she has not had any fevers/chills, new cough, n/v/d.     Otherwise, she denies new constitutional symptoms such as anorexia, weight loss, night sweats or unexplained fevers.  Furthermore, she denies symptoms of marrow failure: unexplained bleeding or bruising, recurrent or unexplained intercurrent infections, dyspnea on exertion, lightheadedness, palpitations or chest pain.  There have been no new or unexplained pains or self-identified masses, swelling or enlarged lymph nodes.    Past Medical, Surgical and Family History were reviewed and pertinent updates were made in the Electronic Medical Record    Review of Systems:  Other than as reported above in the interim history, the other systems reviewed were unremarkable.    ECOG Performance Status: 1    Past Medical History:  Past Medical History:   Diagnosis Date   ??? GERD (gastroesophageal reflux disease)    ??? Gout    ??? HTN (hypertension)    ??? Morbid obesity (CMS-HCC)    ??? Osteoporosis    ??? Schizoaffective disorder, bipolar type (CMS-HCC)    ??? Type II diabetes mellitus (CMS-HCC)        Medications:    Current Outpatient Medications   Medication Sig Dispense Refill   ??? acetaminophen (TYLENOL) 325 MG tablet acetaminophen 325 mg tablet   TAKE 3 TABLETS BY MOUTH TWICE DAILY.     ??? allopurinoL (ZYLOPRIM) 100 MG tablet Take 100 mg by mouth daily.     ??? clozapine (CLOZARIL) 100 MG tablet Take 100 mg by mouth daily.     ??? cloZAPine (CLOZARIL) 50 MG tablet Take 50 mg by mouth daily.     ??? diclofenac sodium (VOLTAREN) 1 % gel      ??? divalproex (DEPAKOTE) 250 MG DR tablet Take 250 mg by mouth Three (3) times a day. ??? docusate sodium (COLACE) 100 MG capsule Take 100 mg by mouth Two (2) times a day.     ??? famotidine (PEPCID) 40 MG tablet      ??? folic acid (FOLVITE) 1 MG tablet Take 1 mg by mouth daily.     ??? glycopyrrolate (ROBINUL) 1 mg tablet Take 1 mg by mouth Two (2) times a day.     ???  imatinib (GLEEVEC) 400 MG tablet Take 1 tablet (400 mg total) by mouth daily. 30 each 11   ??? lisinopriL (PRINIVIL,ZESTRIL) 2.5 MG tablet Take 2.5 mg by mouth daily.     ??? loperamide (IMODIUM) 2 mg capsule loperamide 2 mg capsule   Take 1 capsule 3 times a day by oral route as needed for 30 days.     ??? metFORMIN (GLUCOPHAGE) 500 MG tablet Take 500 mg by mouth 2 (two) times a day with meals.     ??? metoprolol succinate (TOPROL-XL) 100 MG 24 hr tablet Take 100 mg by mouth daily.     ??? multiple vitamin with vit K, adult, (MULTIPLE VITAMIN) 3,300 unit- 150 mcg/10 mL injection Infuse into a venous catheter.     ??? multivitamin capsule Take 1 capsule by mouth daily.     ??? pravastatin (PRAVACHOL) 20 MG tablet Take 20 mg by mouth daily.     ??? pyridoxine, vitamin B6, (B-6) 100 MG tablet Take 200 mg by mouth daily.     ??? sennosides (SENOKOT EXTRA STRENGTH) 17.2 mg Tab Continuous.     ??? zinc sulfate 220 mg Tab tablet Take 1 tablet (220 mg total) by mouth daily. 30 tablet 0   ??? cloZAPine (CLOZARIL) 50 MG tablet Take 3 tablets (150 mg total) by mouth daily. 90 tablet 0   ??? divalproex (DEPAKOTE) 500 MG DR tablet Take 2 tablets (1,000 mg total) by mouth nightly. (Patient taking differently: Take 750 mg by mouth nightly. ) 60 tablet 0   ??? metFORMIN (GLUCOPHAGE) 500 MG tablet Take 2 tablets (1,000 mg total) by mouth daily. 60 tablet 0   ??? metoprolol succinate (TOPROL-XL) 100 MG 24 hr tablet Take 1 tablet (100 mg total) by mouth daily. 30 tablet 0   ??? pravastatin (PRAVACHOL) 10 MG tablet Take 1 tablet (10 mg total) by mouth daily. (Patient taking differently: Take 20 mg by mouth daily. ) 30 tablet 0     No current facility-administered medications for this visit. Vital Signs:  BSA: 2.47 meters squared  Vitals:    04/10/19 1228   BP: 145/73   Pulse: 98   Resp: 18   Temp: 35.9 ??C (96.7 ??F)   SpO2: 97%     Physical Exam:  Constitutional: Resting, in no apparent distress  Eyes: PERRL. No scleral icterus or conjunctival injection.  Cardiovascular:  RRR.  S1, S2.  No murmurs, gallops or rubs.  No edema.  Respiratory:  Breathing is unlabored, and patient is speaking full sentences with ease.  CTAB. No rales, ronchi or crackles.    GI:  No distention or pain on palpation.  Bowel sounds are present.  No palpable masses.  Musculoskeletal:   Range of motion about the shoulder, elbow, hips and knees is grossly normal.  Ambulates with walker.   Skin:  No rashes, petechiae or purpura.  grossly intact.   Neurologic:  alert and oriented to person and place.  Gait steady with wakler  Psychiatric: Range of affect is flat.    Relevant Laboratory, radiology and pathology results:  Lab on 04/10/2019   Component Date Value Ref Range Status   ??? Sodium 04/10/2019 142  135 - 145 mmol/L Final   ??? Potassium 04/10/2019 4.3  3.5 - 5.0 mmol/L Final   ??? Chloride 04/10/2019 103  98 - 107 mmol/L Final   ??? Anion Gap 04/10/2019 12  7 - 15 mmol/L Final   ??? CO2 04/10/2019 27.0  22.0 - 30.0 mmol/L Final   ???  BUN 04/10/2019 11  7 - 21 mg/dL Final   ??? Creatinine 04/10/2019 0.59* 0.60 - 1.00 mg/dL Final   ??? BUN/Creatinine Ratio 04/10/2019 19   Final   ??? EGFR CKD-EPI Non-African American,* 04/10/2019 >90  >=60 mL/min/1.33m2 Final   ??? EGFR CKD-EPI African American, Fem* 04/10/2019 >90  >=60 mL/min/1.39m2 Final   ??? Glucose 04/10/2019 94  70 - 179 mg/dL Final   ??? Calcium 16/10/9602 9.3  8.5 - 10.2 mg/dL Final   ??? Albumin 54/09/8117 4.5  3.5 - 5.0 g/dL Final   ??? Total Protein 04/10/2019 7.7  6.5 - 8.3 g/dL Final   ??? Total Bilirubin 04/10/2019 0.2  0.0 - 1.2 mg/dL Final   ??? AST 14/78/2956 22  14 - 38 U/L Final   ??? ALT 04/10/2019 11  <35 U/L Final   ??? Alkaline Phosphatase 04/10/2019 56  38 - 126 U/L Final   ??? Collection 04/10/2019 Collected   Final   ??? WBC 04/10/2019 9.7  4.5 - 11.0 10*9/L Final   ??? RBC 04/10/2019 3.50* 4.00 - 5.20 10*12/L Final   ??? HGB 04/10/2019 10.4* 12.0 - 16.0 g/dL Final   ??? HCT 21/30/8657 34.1* 36.0 - 46.0 % Final   ??? MCV 04/10/2019 97.6  80.0 - 100.0 fL Final   ??? MCH 04/10/2019 29.7  26.0 - 34.0 pg Final   ??? MCHC 04/10/2019 30.4* 31.0 - 37.0 g/dL Final   ??? RDW 84/69/6295 14.3  12.0 - 15.0 % Final   ??? MPV 04/10/2019 7.7  7.0 - 10.0 fL Final   ??? Platelet 04/10/2019 374  150 - 440 10*9/L Final   ??? Neutrophils % 04/10/2019 51.8  % Final   ??? Lymphocytes % 04/10/2019 38.7  % Final   ??? Monocytes % 04/10/2019 6.7  % Final   ??? Eosinophils % 04/10/2019 1.4  % Final   ??? Basophils % 04/10/2019 0.4  % Final   ??? Absolute Neutrophils 04/10/2019 5.0  2.0 - 7.5 10*9/L Final   ??? Absolute Lymphocytes 04/10/2019 3.7  1.5 - 5.0 10*9/L Final   ??? Absolute Monocytes 04/10/2019 0.7  0.2 - 0.8 10*9/L Final   ??? Absolute Eosinophils 04/10/2019 0.1  0.0 - 0.4 10*9/L Final   ??? Absolute Basophils 04/10/2019 0.0  0.0 - 0.1 10*9/L Final   ??? Large Unstained Cells 04/10/2019 1  0 - 4 % Final   ??? Macrocytosis 04/10/2019 Slight* Not Present Final   ??? Hypochromasia 04/10/2019 Slight* Not Present Final   ??? Case Report 04/10/2019    Final                    Value:Molecular Genetics Report                         Case: [id number]                                 Authorizing Provider:  Virgil Benedict, [place]  Collected:           04/10/2019 1103              Ordering Location:     [facility] ADULT ONCOL[facility]Y [facility]    Received:            04/10/2019 1113  [address]                                                     Pathologist:           Rebecca Eaton, MD                                                       Specimen:    Blood                                                                                     ??? Specimen Type 04/10/2019    Final                    Value:Blood   ??? BCR/ABL1 p210 Assay 04/10/2019 Positive   Final   ??? BCR/ABL1 p210 IS% Ratio 04/10/2019 0.037   Final   ??? BCR/ABL1 p210 Assay Results 04/10/2019    Final                    Value:This result contains rich text formatting which cannot be displayed here.

## 2019-04-24 DIAGNOSIS — C9210 Chronic myeloid leukemia, BCR/ABL-positive, not having achieved remission: Principal | ICD-10-CM

## 2019-04-24 MED FILL — IMATINIB 400 MG TABLET: 30 days supply | Qty: 30 | Fill #6 | Status: AC

## 2019-04-24 MED FILL — IMATINIB 400 MG TABLET: ORAL | 30 days supply | Qty: 30 | Fill #6

## 2019-05-23 NOTE — Unmapped (Signed)
The [facility] has made a third and final attempt to reach this patient to refill the following medication:Imatinib 400mg .      We have left voicemails on the following phone numbers: [phone] and have been unable to leave messages on the following phone numbers: [phone].    Dates contacted: 05/14/19,05/19/19, 05/23/19  Last scheduled delivery: 04/24/19    The patient may be at risk of non-compliance with this medication. The patient should call the [facility] at [phone] (option 4) to refill medication.    [name] Hulda Humphrey   [facility] Specialty Technician

## 2019-06-05 NOTE — Unmapped (Signed)
Contacted Kimberly Mathis on patient's number.  Reported she is in a Family care home at the time and has one unopened bottle of imatinib + 1 opened bottle.  [facility] will reschedule refill call for 3 weeks later.    Horace Porteous, PharmD  [facility]

## 2019-07-02 MED FILL — IMATINIB 400 MG TABLET: 30 days supply | Qty: 30 | Fill #7 | Status: AC

## 2019-07-02 MED FILL — IMATINIB 400 MG TABLET: ORAL | 30 days supply | Qty: 30 | Fill #7

## 2019-07-02 NOTE — Unmapped (Signed)
[facility] Refill Coordination Note    Specialty Medication(s) to be Shipped:   Hematology/Oncology: Imatinib    Other medication(s) to be shipped: n/a     Kimberly Mathis, DOB: Jun 23, 1965  Phone: [phone] (home)       All above HIPAA information was verified with Delice Bison     Was a translator used for this call? No    Completed refill call assessment today to schedule patient's medication shipment from the [facility] [phone]).       Specialty medication(s) and dose(s) confirmed: Regimen is correct and unchanged.   Changes to medications: Kimberly Mathis reports no changes at this time.  Changes to insurance: No  Questions for the pharmacist: No    Confirmed patient received Welcome Packet with first shipment. The patient will receive a drug information handout for each medication shipped and additional FDA Medication Guides as required.       DISEASE/MEDICATION-SPECIFIC INFORMATION        N/A    SPECIALTY MEDICATION ADHERENCE     Medication Adherence    Patient reported X missed doses in the last month: 1  Specialty Medication: Imatinib 400mg   Patient is on additional specialty medications: No  Informant: caregiver  Support network for adherence: home health agency                Imatinib 400 mg: 0 days of medicine on hand         SHIPPING     Shipping address confirmed in Epic.     Delivery Scheduled: Yes, Expected medication delivery date: 07/02/19.     Medication will be delivered via Same Day Courier to the prescription address in Epic WAM.    Jasper Loser   [facility] Specialty Technician

## 2019-07-25 NOTE — Unmapped (Signed)
[facility] Clinical Assessment & Refill Coordination Note    Kimberly Mathis, DOB: February 28, 1965  Phone: [phone] (home)     All above HIPAA information was verified with patient's caregiver, Rosemarie Beath.     Was a Nurse, learning disability used for this call? No    Specialty Medication(s):   Hematology/Oncology: Imatinib     Current Outpatient Medications   Medication Sig Dispense Refill   ??? acetaminophen (TYLENOL) 325 MG tablet acetaminophen 325 mg tablet   TAKE 3 TABLETS BY MOUTH TWICE DAILY.     ??? allopurinoL (ZYLOPRIM) 100 MG tablet Take 100 mg by mouth daily.     ??? clozapine (CLOZARIL) 100 MG tablet Take 100 mg by mouth daily.     ??? cloZAPine (CLOZARIL) 50 MG tablet Take 3 tablets (150 mg total) by mouth daily. 90 tablet 0   ??? cloZAPine (CLOZARIL) 50 MG tablet Take 50 mg by mouth daily.     ??? diclofenac sodium (VOLTAREN) 1 % gel      ??? divalproex (DEPAKOTE) 250 MG DR tablet Take 250 mg by mouth Three (3) times a day.     ??? divalproex (DEPAKOTE) 500 MG DR tablet Take 2 tablets (1,000 mg total) by mouth nightly. (Patient taking differently: Take 750 mg by mouth nightly. ) 60 tablet 0   ??? docusate sodium (COLACE) 100 MG capsule Take 100 mg by mouth Two (2) times a day.     ??? famotidine (PEPCID) 40 MG tablet      ??? folic acid (FOLVITE) 1 MG tablet Take 1 mg by mouth daily.     ??? glycopyrrolate (ROBINUL) 1 mg tablet Take 1 mg by mouth Two (2) times a day.     ??? imatinib (GLEEVEC) 400 MG tablet Take 1 tablet (400 mg total) by mouth daily. 30 each 11   ??? lisinopriL (PRINIVIL,ZESTRIL) 2.5 MG tablet Take 2.5 mg by mouth daily.     ??? loperamide (IMODIUM) 2 mg capsule loperamide 2 mg capsule   Take 1 capsule 3 times a day by oral route as needed for 30 days.     ??? metFORMIN (GLUCOPHAGE) 500 MG tablet Take 2 tablets (1,000 mg total) by mouth daily. 60 tablet 0   ??? metFORMIN (GLUCOPHAGE) 500 MG tablet Take 500 mg by mouth 2 (two) times a day with meals.     ??? metoprolol succinate (TOPROL-XL) 100 MG 24 hr tablet Take 1 tablet (100 mg total) by mouth daily. 30 tablet 0   ??? metoprolol succinate (TOPROL-XL) 100 MG 24 hr tablet Take 100 mg by mouth daily.     ??? multiple vitamin with vit K, adult, (MULTIPLE VITAMIN) 3,300 unit- 150 mcg/10 mL injection Infuse into a venous catheter.     ??? multivitamin capsule Take 1 capsule by mouth daily.     ??? pravastatin (PRAVACHOL) 10 MG tablet Take 1 tablet (10 mg total) by mouth daily. (Patient taking differently: Take 20 mg by mouth daily. ) 30 tablet 0   ??? pravastatin (PRAVACHOL) 20 MG tablet Take 20 mg by mouth daily.     ??? pyridoxine, vitamin B6, (B-6) 100 MG tablet Take 200 mg by mouth daily.     ??? sennosides (SENOKOT EXTRA STRENGTH) 17.2 mg Tab Continuous.     ??? zinc sulfate 220 mg Tab tablet Take 1 tablet (220 mg total) by mouth daily. 30 tablet 0     No current facility-administered medications for this visit.  Changes to medications: Jennica reports no changes at this time.    No Known Allergies    Changes to allergies: No    SPECIALTY MEDICATION ADHERENCE     imatinib 400 mg: 7 days of medicine on hand     Medication Adherence    Patient reported X missed doses in the last month: 0  Specialty Medication: imatinib 400 mg take daily  Patient is on additional specialty medications: No  Informant: caregiver  Support network for adherence: home health agency  Confirmed plan for next specialty medication refill: delivery by pharmacy          Specialty medication(s) dose(s) confirmed: Regimen is correct and unchanged.     Are there any concerns with adherence? No    Adherence counseling provided? Not needed    CLINICAL MANAGEMENT AND INTERVENTION      Clinical Benefit Assessment:    Do you feel the medicine is effective or helping your condition? Yes    Clinical Benefit counseling provided? Not needed    Adverse Effects Assessment:    Are you experiencing any side effects? No    Are you experiencing difficulty administering your medicine? No    Quality of Life Assessment: How many days over the past month did your CML  keep you from your normal activities? For example, brushing your teeth or getting up in the morning. 0    Have you discussed this with your provider? Yes    Therapy Appropriateness:    Is therapy appropriate? Yes, therapy is appropriate and should be continued    DISEASE/MEDICATION-SPECIFIC INFORMATION      N/A    PATIENT SPECIFIC NEEDS     - Does the patient have any physical, cognitive, or cultural barriers? Yes - Patient living in assisted living facility    - Is the patient high risk? Yes, patient is taking oral chemotherapy. Appropriateness of therapy as been assessed.     - Does the patient require a Care Management Plan? No     - Does the patient require physician intervention or other additional services (i.e. nutrition, smoking cessation, social work)? No      SHIPPING     Specialty Medication(s) to be Shipped:   Hematology/Oncology: Imatinib    Other medication(s) to be shipped: No additional medications requested for fill at this time     Changes to insurance: No    Delivery Scheduled: Yes, Expected medication delivery date: 07/30/19.     Medication will be delivered via Next Day Courier to the confirmed prescription address in [facility].    The patient will receive a drug information handout for each medication shipped and additional FDA Medication Guides as required.  Verified that patient has previously received a Conservation officer, historic buildings.    All of the patient's questions and concerns have been addressed.    Breck Coons [facility] Specialty Pharmacist

## 2019-07-29 MED FILL — IMATINIB 400 MG TABLET: 30 days supply | Qty: 30 | Fill #8 | Status: AC

## 2019-07-29 MED FILL — IMATINIB 400 MG TABLET: ORAL | 30 days supply | Qty: 30 | Fill #8

## 2019-08-25 DIAGNOSIS — C9210 Chronic myeloid leukemia, BCR/ABL-positive, not having achieved remission: Principal | ICD-10-CM

## 2019-08-25 MED ORDER — IMATINIB 400 MG TABLET
ORAL_TABLET | Freq: Every day | ORAL | 11 refills | 30.00000 days | Status: CP
Start: 2019-08-25 — End: 2020-08-24
  Filled 2019-08-26: qty 30, 30d supply, fill #0

## 2019-08-25 NOTE — Unmapped (Signed)
[facility] Refill Coordination Note    Specialty Medication(s) to be Shipped:   Hematology/Oncology: Imatinib    Other medication(s) to be shipped: No additional medications requested for fill at this time     Kimberly Mathis, DOB: Jan 03, 1966  Phone: [phone] (home)       All above HIPAA information was verified with patient.     Was a Nurse, learning disability used for this call? No    Completed refill call assessment today to schedule patient's medication shipment from the [facility] [phone]).       Specialty medication(s) and dose(s) confirmed: Regimen is correct and unchanged.   Changes to medications: Bevely reports no changes at this time.  Changes to insurance: No  Questions for the pharmacist: No    Confirmed patient received Welcome Packet with first shipment. The patient will receive a drug information handout for each medication shipped and additional FDA Medication Guides as required.       DISEASE/MEDICATION-SPECIFIC INFORMATION        N/A    SPECIALTY MEDICATION ADHERENCE     Medication Adherence    Patient reported X missed doses in the last month: 0  Specialty Medication: Imatinib 400mg   Patient is on additional specialty medications: No  Support network for adherence: home health agency              Imatinib 400mg : 0 days worth of medication on hand.          SHIPPING     Shipping address confirmed in Epic.     Delivery Scheduled: Yes, Expected medication delivery date: 08/26/19.     Medication will be delivered via Same Day Courier to the prescription address in Epic WAM.    [place] A Sigourney Portillo   [facility] Specialty Technician

## 2019-08-26 MED FILL — IMATINIB 400 MG TABLET: 30 days supply | Qty: 30 | Fill #0 | Status: AC

## 2019-08-26 NOTE — Unmapped (Signed)
LVM with the care facility about scheduling a follow up with [facility]

## 2019-08-26 NOTE — Unmapped (Signed)
Spoke with the facility about scheduling the follow with [facility] on 9/1

## 2019-09-03 ENCOUNTER — Ambulatory Visit: Admit: 2019-09-03 | Discharge: 2019-09-03 | Payer: MEDICARE

## 2019-09-03 DIAGNOSIS — C9210 Chronic myeloid leukemia, BCR/ABL-positive, not having achieved remission: Principal | ICD-10-CM

## 2019-09-03 LAB — COMPREHENSIVE METABOLIC PANEL
ALBUMIN: 3.9 g/dL (ref 3.4–5.0)
ALKALINE PHOSPHATASE: 66 U/L (ref 46–116)
ANION GAP: 7 mmol/L (ref 5–14)
AST (SGOT): 12 U/L (ref <=34)
BILIRUBIN TOTAL: <0.2 mg/dL — ABNORMAL LOW (ref 0.3–1.2)
BLOOD UREA NITROGEN: 9 mg/dL (ref 9–23)
BUN / CREAT RATIO: 17
CALCIUM: 9.5 mg/dL (ref 8.7–10.4)
CHLORIDE: 105 mmol/L (ref 98–107)
CO2: 30 mmol/L (ref 20.0–31.0)
CREATININE: 0.52 mg/dL — ABNORMAL LOW
EGFR CKD-EPI AA FEMALE: >90 mL/min/{1.73_m2} (ref >=60)
EGFR CKD-EPI NON-AA FEMALE: >90 mL/min/{1.73_m2} (ref >=60)
GLUCOSE RANDOM: 103 mg/dL (ref 70–179)
POTASSIUM: 3.9 mmol/L (ref 3.4–4.5)
PROTEIN TOTAL: 8.1 g/dL (ref 5.7–8.2)
SODIUM: 142 mmol/L (ref 135–145)

## 2019-09-03 LAB — CBC W/ AUTO DIFF
BASOPHILS ABSOLUTE COUNT: 0.1 10*9/L (ref 0.0–0.1)
BASOPHILS RELATIVE PERCENT: 0.5 %
EOSINOPHILS ABSOLUTE COUNT: 0.2 10*9/L (ref 0.0–0.4)
EOSINOPHILS RELATIVE PERCENT: 2 %
HEMOGLOBIN: 11.6 g/dL — ABNORMAL LOW (ref 12.0–16.0)
LARGE UNSTAINED CELLS: 1 % (ref 0–4)
LYMPHOCYTES ABSOLUTE COUNT: 4 10*9/L (ref 1.5–5.0)
LYMPHOCYTES RELATIVE PERCENT: 33 %
MEAN CORPUSCULAR HEMOGLOBIN CONC: 33.2 g/dL (ref 31.0–37.0)
MEAN CORPUSCULAR HEMOGLOBIN: 30 pg (ref 26.0–34.0)
MEAN PLATELET VOLUME: 8.3 fL (ref 7.0–10.0)
MONOCYTES ABSOLUTE COUNT: 0.5 10*9/L (ref 0.2–0.8)
MONOCYTES RELATIVE PERCENT: 4.3 %
NEUTROPHILS ABSOLUTE COUNT: 7.1 10*9/L (ref 2.0–7.5)
NEUTROPHILS RELATIVE PERCENT: 59.1 %
PLATELET COUNT: 444 10*9/L — ABNORMAL HIGH (ref 150–440)
RED BLOOD CELL COUNT: 3.87 10*12/L — ABNORMAL LOW (ref 4.00–5.20)
RED CELL DISTRIBUTION WIDTH: 16.4 % — ABNORMAL HIGH (ref 12.0–15.0)
WBC ADJUSTED: 12 10*9/L — ABNORMAL HIGH (ref 4.5–11.0)

## 2019-09-03 LAB — EOSINOPHILS ABSOLUTE COUNT: Eosinophils:NCnc:Pt:Bld:Qn:Automated count: 0.2

## 2019-09-03 LAB — AST (SGOT): Aspartate aminotransferase:CCnc:Pt:Ser/Plas:Qn:: 12

## 2019-09-03 NOTE — Unmapped (Signed)
[facility] Leukemia Clinic Follow-up    Patient Name: Kimberly Mathis  Patient Age: 54 y.o.  Encounter Date: 09/03/2019    Primary Care Provider:  Marcy Siren, PA    Referring Physician:  Avie Arenas, MD  [address]  [id number]  [place],  [place] [address]    Reason for visit:   F/u visit for [facility]    Assessment:  Kimberly Mathis is a 54 y.o. female with history of severe schizoaffective disorder and cpCML found incidentally on blood work without significant hyperleukocytosis. She began treatment on imatinib in 12/2015 with optimal response and meeting appropriate treatment milestones, except her one year PCR, which was slightly above goal.  She has been in a MMR and  MR4.0 since 05/2018 until Jan 2021 when she lost MMR d/t caregiver change and missing medication.  Since that time she was able to achieve MMR again.   She presents for a follow up appointment.      Kimberly Mathis Mathis to clinic today alone without a caregiver and unfortunately she is unable to give me a thorough history.  Per records she has been continued on Gleevec.  She did have extended hospitalization over the spring/summer for ~66month.  Awaiting outside records.  She has seen been placed in a long term care facility.  I spoke with a caretaker from facility and she states that Kimberly Mathis is not very active.  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  She had tracheostomy during her hospitalization which is currently removed and completely healed.  BCR/ABL PCR pending from today.  As long as she continues to be in MMR we will plan to see her again in 3 months.       Plan and Recommendations:  - continue Imatinib 400 mg daily  - RTC in 3 months with BCR/ABL PCR lab check      Dr. Vertell Limber was available.    Arna Medici, AGNP-BC  Leukemia Research Nurse Practitioner  Hematology/Oncology Division  [facility]  09/03/2019     I personally spent 75 minutes face-to-face and non-face-to-face in the care of this patient, which includes all pre, intra, and post visit time on the date of service.      History of Present Illness:  Oncology History Overview Note   Diagnosis: CML    SOKAL Score:    Presentation: asymptomatic; leukocytosis found on routine CBC    Presenting WBC Count: 17,400    Bone Marrow Biopsy: not performed as patient unable to tolerate procedure.    Cytogenetics:  Abnormal FISH: An interphase FISH assay shows an abnormal signal pattern consistent with BCR/ABL1 fusion in 44% of the 100 cells scored. Of note, these cells contain an atypical abnormal signal pattern consistent with BCR/ABL1 rearrangement and loss of the ABL1/ASS1 region.    Treatment:  Imatinib 400 mg daily- 12/31/2015    12/31/15:  BCR-ABL1 p210 transcripts were detected at a level of 9.511 IS% ratio in blood.  BCR-ABL1 p190 transcripts were detected at a level of 3 in 100,000 cells in blood.    03/23/16:  BCR-ABL1 p210 transcripts were detected at a level of 0.702 IS % ratio in blood.    Normal FISH:  A BCR/ABL1 interphase FISH assay for the previously documented 9;22 translocation shows a normal signal pattern in 100% of the 200 nuclei scored     06/15/16:  BCR-ABL1 p210 transcripts were detected at  a level of 0.272 IS % ratio in blood.    09/2016:  0.298 IS %    12/18: 0.241 IS%    03/22/17: 0.072%    06/21/17: 0.052%    09/27/17: 0.125%    01/03/18: 0.040%    05/28/18: 0.008%    09/13/18: 0.003%    01/14/2019: 0.181%  02/05/2019: 0.149%  04/10/2019: 0.037%       CML (chronic myelocytic leukemia) (CMS-HCC)   12/28/2015 Initial Diagnosis    CML (chronic myelocytic leukemia) (RAF-HCC)       Interval History:  Kimberly Mathis to clinic today unaccompanied.  She is quite agitated and keeps yelling that she is uncomfortable in the wheelchair and needs to get back downstairs to the [place] that brought her.  She was hospitalized over the summer and then transitioned to a LTCF, but she is unable to tell me why she was in the hospital or where she is currently residing.  Given her schizoaffective disorder she is unable to provide me with complete history or ROS.  She denies any sob, n/v/d, or new rashes, swelling.         Past Medical, Surgical and Family History were reviewed and pertinent updates were made in the Electronic Medical Record    Review of Systems:  All other systems reviewed were negative.     ECOG Performance Status: 3    Past Medical History:  Past Medical History:   Diagnosis Date   ??? GERD (gastroesophageal reflux disease)    ??? Gout    ??? HTN (hypertension)    ??? Morbid obesity (CMS-HCC)    ??? Osteoporosis    ??? Schizoaffective disorder, bipolar type (CMS-HCC)    ??? Type II diabetes mellitus (CMS-HCC)        Medications:    Current Outpatient Medications   Medication Sig Dispense Refill   ??? acetaminophen (TYLENOL) 325 MG tablet acetaminophen 325 mg tablet   TAKE 3 TABLETS BY MOUTH TWICE DAILY.     ??? allopurinoL (ZYLOPRIM) 100 MG tablet Take 100 mg by mouth daily.     ??? clozapine (CLOZARIL) 100 MG tablet Take 100 mg by mouth daily.     ??? cloZAPine (CLOZARIL) 50 MG tablet Take 50 mg by mouth daily.     ??? diclofenac sodium (VOLTAREN) 1 % gel      ??? divalproex (DEPAKOTE) 250 MG DR tablet Take 250 mg by mouth Three (3) times a day.     ??? docusate sodium (COLACE) 100 MG capsule Take 100 mg by mouth Two (2) times a day.     ??? famotidine (PEPCID) 40 MG tablet      ??? folic acid (FOLVITE) 1 MG tablet Take 1 mg by mouth daily.     ??? glycopyrrolate (ROBINUL) 1 mg tablet Take 1 mg by mouth Two (2) times a day.     ??? imatinib (GLEEVEC) 400 MG tablet Take 1 tablet (400 mg total) by mouth daily. 30 tablet 11   ??? lisinopriL (PRINIVIL,ZESTRIL) 2.5 MG tablet Take 2.5 mg by mouth daily.     ??? loperamide (IMODIUM) 2 mg capsule loperamide 2 mg capsule   Take 1 capsule 3 times a day by oral route as needed for 30 days.     ??? metFORMIN (GLUMETZA) 1000 MG (MOD) 24 hr tablet Take 1,000 mg by mouth every morning before breakfast. ??? metoprolol succinate (TOPROL-XL) 100 MG 24 hr tablet Take 100 mg by mouth daily.     ??? multiple vitamin  with vit K, adult, (MULTIPLE VITAMIN) 3,300 unit- 150 mcg/10 mL injection Infuse into a venous catheter.     ??? multivitamin capsule Take 1 capsule by mouth daily.     ??? pravastatin (PRAVACHOL) 20 MG tablet Take 20 mg by mouth daily.     ??? pyridoxine, vitamin B6, (B-6) 100 MG tablet Take 200 mg by mouth daily.     ??? sennosides (SENOKOT EXTRA STRENGTH) 17.2 mg Tab Continuous.     ??? zinc sulfate 220 mg Tab tablet Take 1 tablet (220 mg total) by mouth daily. 30 tablet 0   ??? cloZAPine (CLOZARIL) 50 MG tablet Take 3 tablets (150 mg total) by mouth daily. 90 tablet 0   ??? divalproex (DEPAKOTE) 500 MG DR tablet Take 2 tablets (1,000 mg total) by mouth nightly. (Patient taking differently: Take 750 mg by mouth nightly. ) 60 tablet 0   ??? metoprolol succinate (TOPROL-XL) 100 MG 24 hr tablet Take 1 tablet (100 mg total) by mouth daily. 30 tablet 0   ??? pravastatin (PRAVACHOL) 10 MG tablet Take 1 tablet (10 mg total) by mouth daily. (Patient taking differently: Take 20 mg by mouth daily. ) 30 tablet 0     No current facility-administered medications for this visit.     Vital Signs:  BSA: 2.47 meters squared  Vitals:    09/03/19 1218   BP: 128/83   Pulse: 104   Resp: 16   Temp: 36.8 ??C (98.3 ??F)   SpO2: 98%     Physical Exam:  Constitutional: Yelling in wheelchair stating that she is uncomfortable and has to leave.  Eyes: PERRL. No scleral icterus or conjunctival injection.  Cardiovascular:  RRR.  S1, S2.  No murmurs, gallops or rubs.  No edema.  Respiratory:  Breathing is unlabored, and patient is speaking full sentences with ease.  CTAB, but diminished throughout. No rales, ronchi or crackles.    GI:  No distention or pain on palpation.  Bowel sounds are present.  No palpable masses.  Musculoskeletal:   strength is equal bilaterally   Skin:  No rashes, petechiae or purpura.  grossly intact.   Neurologic:  alert and oriented to place.   Psychiatric: agitated    Relevant Laboratory, radiology and pathology results:  Appointment on 09/03/2019   Component Date Value Ref Range Status   ??? Sodium 09/03/2019 142  135 - 145 mmol/L Final   ??? Potassium 09/03/2019 3.9  3.4 - 4.5 mmol/L Final   ??? Chloride 09/03/2019 105  98 - 107 mmol/L Final   ??? Anion Gap 09/03/2019 7  5 - 14 mmol/L Final   ??? CO2 09/03/2019 30.0  20.0 - 31.0 mmol/L Final   ??? BUN 09/03/2019 9  9 - 23 mg/dL Final   ??? Creatinine 09/03/2019 0.52* 0.60 - 0.80 mg/dL Final   ??? BUN/Creatinine Ratio 09/03/2019 17   Final   ??? EGFR CKD-EPI Non-African American,* 09/03/2019 >90  >=60 mL/min/1.52m2 Final   ??? EGFR CKD-EPI African American, Fem* 09/03/2019 >90  >=60 mL/min/1.20m2 Final   ??? Glucose 09/03/2019 103  70 - 179 mg/dL Final   ??? Calcium 82/95/6213 9.5  8.7 - 10.4 mg/dL Final   ??? Albumin 08/65/7846 3.9  3.4 - 5.0 g/dL Final   ??? Total Protein 09/03/2019 8.1  5.7 - 8.2 g/dL Final   ??? Total Bilirubin 09/03/2019 <0.2* 0.3 - 1.2 mg/dL Final   ??? AST 96/29/5284 12  <=34 U/L Final   ??? ALT 09/03/2019 <7* 10 - 49  U/L Final   ??? Alkaline Phosphatase 09/03/2019 66  46 - 116 U/L Final   ??? Collection 09/03/2019 Collected   Final   ??? WBC 09/03/2019 12.0* 4.5 - 11.0 10*9/L Final   ??? RBC 09/03/2019 3.87* 4.00 - 5.20 10*12/L Final   ??? HGB 09/03/2019 11.6* 12.0 - 16.0 g/dL Final   ??? HCT 54/09/8117 35.0* 36.0 - 46.0 % Final   ??? MCV 09/03/2019 90.4  80.0 - 100.0 fL Final   ??? MCH 09/03/2019 30.0  26.0 - 34.0 pg Final   ??? MCHC 09/03/2019 33.2  31.0 - 37.0 g/dL Final   ??? RDW 14/78/2956 16.4* 12.0 - 15.0 % Final   ??? MPV 09/03/2019 8.3  7.0 - 10.0 fL Final   ??? Platelet 09/03/2019 444* 150 - 440 10*9/L Final   ??? Neutrophils % 09/03/2019 59.1  % Final   ??? Lymphocytes % 09/03/2019 33.0  % Final   ??? Monocytes % 09/03/2019 4.3  % Final   ??? Eosinophils % 09/03/2019 2.0  % Final   ??? Basophils % 09/03/2019 0.5  % Final   ??? Absolute Neutrophils 09/03/2019 7.1  2.0 - 7.5 10*9/L Final   ??? Absolute Lymphocytes 09/03/2019 4.0  1.5 - 5.0 10*9/L Final   ??? Absolute Monocytes 09/03/2019 0.5  0.2 - 0.8 10*9/L Final   ??? Absolute Eosinophils 09/03/2019 0.2  0.0 - 0.4 10*9/L Final   ??? Absolute Basophils 09/03/2019 0.1  0.0 - 0.1 10*9/L Final   ??? Large Unstained Cells 09/03/2019 1  0 - 4 % Final   ??? Anisocytosis 09/03/2019 Slight* Not Present Final

## 2019-09-04 NOTE — Unmapped (Signed)
Tried to leave voicemail but not mailbox has been set up. She is scheduled to see Dr.Zeidner on 12/2 9:30/10:30

## 2019-09-22 NOTE — Unmapped (Signed)
patient has enough medication on hand, rescheduling refill call for 10/4

## 2019-10-13 MED FILL — IMATINIB 400 MG TABLET: ORAL | 30 days supply | Qty: 30 | Fill #1

## 2019-10-13 MED FILL — IMATINIB 400 MG TABLET: 30 days supply | Qty: 30 | Fill #1 | Status: AC

## 2019-10-13 NOTE — Unmapped (Signed)
[facility] Refill Coordination Note    Specialty Medication(s) to be Shipped:   Hematology/Oncology: Imatinib    Other medication(s) to be shipped: No additional medications requested for fill at this time     Kimberly Mathis, DOB: 09-28-1965  Phone: [phone] (home)       All above HIPAA information was verified with patient's caregiver, Kimberly Mathis     Was a translator used for this call? No    Completed refill call assessment today to schedule patient's medication shipment from the [facility] [phone]).       Specialty medication(s) and dose(s) confirmed: Regimen is correct and unchanged.   Changes to medications: Kimberly Mathis reports no changes at this time.  Changes to insurance: No  Questions for the pharmacist: No    Confirmed patient received Welcome Packet with first shipment. The patient will receive a drug information handout for each medication shipped and additional FDA Medication Guides as required.       DISEASE/MEDICATION-SPECIFIC INFORMATION        N/A    SPECIALTY MEDICATION ADHERENCE     Medication Adherence    Patient reported X missed doses in the last month: 0  Specialty Medication: Imatinib 400mg   Patient is on additional specialty medications: No  Informant: caregiver  Support network for adherence: home health agency                Imatinib 400 mg: 0 days of medicine on hand         SHIPPING     Shipping address confirmed in Epic.     Delivery Scheduled: Yes, Expected medication delivery date: 10/13/19.     Medication will be delivered via Same Day Courier to the prescription address in [employer] [place].    Kimberly Mathis   [facility] Specialty Technician

## 2019-10-30 NOTE — Unmapped (Unsigned)
Kimberly Mathis states pt has a month of medication.  Scheduling refills for 2 weeks from now.

## 2019-11-13 NOTE — Unmapped (Signed)
[facility] Pharm[facility] Refill Coordination Note    Specialty Medication(s) to be Shipped:   Hematology/Oncology: Imatinib    Other medication(s) to be shipped: No additional medications requested for fill at this time     Kimberly Mathis, DOB: [birth year]  Phone: [phone] (home)       All above HIPAA information was verified with patient's caregiver, Lovenia Shuck     Was a translator used for this call? No    Completed refill call assessment today to schedule patient's medication shipment from the [facility] [phone]).       Specialty medication(s) and dose(s) confirmed: Regimen is correct and unchanged.   Changes to medications: Candi reports no changes at this time.  Changes to insurance: No  Questions for the pharmacist: No    Confirmed patient received Welcome Packet with first shipment. The patient will receive a drug information handout for each medication shipped and additional FDA Medication Guides as required.       DISEASE/MEDICATION-SPECIFIC INFORMATION        N/A    SPECIALTY MEDICATION ADHERENCE     Medication Adherence    Patient reported X missed doses in the last month: 0  Specialty Medication: Imatinib 400mg   Patient is on additional specialty medications: No  Informant: caregiver  Support network for adherence: home health agency                Imatinib 400 mg: 14 days of medicine on hand         SHIPPING     Shipping address confirmed in Epic.     Delivery Scheduled: Yes, Expected medication delivery date: 11/25/19.     Medication will be delivered via Next Day Courier to the prescription address in [employer] [place].    [name]   [facility] Specialty Technician

## 2019-11-24 MED FILL — IMATINIB 400 MG TABLET: 30 days supply | Qty: 30 | Fill #2 | Status: AC

## 2019-11-24 MED FILL — IMATINIB 400 MG TABLET: ORAL | 30 days supply | Qty: 30 | Fill #2

## 2019-12-03 DIAGNOSIS — C9210 Chronic myeloid leukemia, BCR/ABL-positive, not having achieved remission: Principal | ICD-10-CM

## 2019-12-04 ENCOUNTER — Ambulatory Visit
Admit: 2019-12-04 | Discharge: 2019-12-04 | Payer: MEDICARE | Attending: Hematology & Oncology | Primary: Hematology & Oncology

## 2019-12-04 ENCOUNTER — Other Ambulatory Visit: Admit: 2019-12-04 | Discharge: 2019-12-04 | Payer: MEDICARE

## 2019-12-04 DIAGNOSIS — C9210 Chronic myeloid leukemia, BCR/ABL-positive, not having achieved remission: Principal | ICD-10-CM

## 2019-12-04 LAB — CBC W/ AUTO DIFF
BASOPHILS ABSOLUTE COUNT: 0.1 10*9/L (ref 0.0–0.1)
BASOPHILS RELATIVE PERCENT: 0.5 %
EOSINOPHILS ABSOLUTE COUNT: 0.3 10*9/L (ref 0.0–0.4)
EOSINOPHILS RELATIVE PERCENT: 2.5 %
HEMATOCRIT: 34.8 % — ABNORMAL LOW (ref 36.0–46.0)
HEMOGLOBIN: 11.1 g/dL — ABNORMAL LOW (ref 12.0–16.0)
LARGE UNSTAINED CELLS: 2 % (ref 0–4)
LYMPHOCYTES ABSOLUTE COUNT: 4.2 10*9/L (ref 1.5–5.0)
LYMPHOCYTES RELATIVE PERCENT: 38.8 %
MEAN CORPUSCULAR HEMOGLOBIN CONC: 31.8 g/dL (ref 31.0–37.0)
MEAN CORPUSCULAR HEMOGLOBIN: 30.4 pg (ref 26.0–34.0)
MEAN CORPUSCULAR VOLUME: 95.6 fL (ref 80.0–100.0)
MEAN PLATELET VOLUME: 8.2 fL (ref 7.0–10.0)
MONOCYTES ABSOLUTE COUNT: 0.6 10*9/L (ref 0.2–0.8)
MONOCYTES RELATIVE PERCENT: 5.6 %
NEUTROPHILS ABSOLUTE COUNT: 5.5 10*9/L (ref 2.0–7.5)
NEUTROPHILS RELATIVE PERCENT: 50.8 %
PLATELET COUNT: 440 10*9/L (ref 150–440)
RED BLOOD CELL COUNT: 3.64 10*12/L — ABNORMAL LOW (ref 4.00–5.20)
RED CELL DISTRIBUTION WIDTH: 14.7 % (ref 12.0–15.0)
WBC ADJUSTED: 10.8 10*9/L (ref 4.5–11.0)

## 2019-12-04 LAB — COMPREHENSIVE METABOLIC PANEL
ALBUMIN: 4.3 g/dL (ref 3.4–5.0)
ALKALINE PHOSPHATASE: 70 U/L (ref 46–116)
ALT (SGPT): 10 U/L (ref 10–49)
ANION GAP: 6 mmol/L (ref 5–14)
AST (SGOT): 16 U/L (ref <=34)
BILIRUBIN TOTAL: 0.2 mg/dL — ABNORMAL LOW (ref 0.3–1.2)
BLOOD UREA NITROGEN: 8 mg/dL — ABNORMAL LOW (ref 9–23)
BUN / CREAT RATIO: 16
CALCIUM: 9.7 mg/dL (ref 8.7–10.4)
CHLORIDE: 103 mmol/L (ref 98–107)
CO2: 30 mmol/L (ref 20.0–31.0)
CREATININE: 0.51 mg/dL — ABNORMAL LOW
EGFR CKD-EPI AA FEMALE: >90 mL/min/{1.73_m2} (ref >=60)
EGFR CKD-EPI NON-AA FEMALE: >90 mL/min/{1.73_m2} (ref >=60)
GLUCOSE RANDOM: 103 mg/dL (ref 70–179)
POTASSIUM: 4 mmol/L (ref 3.4–4.5)
PROTEIN TOTAL: 8.3 g/dL — ABNORMAL HIGH (ref 5.7–8.2)
SODIUM: 139 mmol/L (ref 135–145)

## 2019-12-04 NOTE — Unmapped (Signed)
[facility] Follow-up    Patient Name: Kimberly Mathis  Patient Age: 54 y.o.  Encounter Date: 12/04/2019    Primary Care Provider:  Marcy Siren, PA    Referring Physician:  Virgil Benedict, AGNP  [address]  [id number]  [place],  [place] [address]    Reason for visit:   F/u visit for [facility]    Assessment:  Kimberly Mathis is a 54 y.o. female with history of severe schizoaffective disorder and cpCML found incidentally on blood work without significant hyperleukocytosis. She began treatment on imatinib in 12/2015 with optimal response and meeting appropriate treatment milestones, except her one year PCR, which was slightly above goal.  She has been in a MMR and MR4.0 since 05/2018 until Jan 2021 when she lost MMR d/t caregiver change and missing medication.  Since that time she was able to achieve MMR again with most recent BCR/ABL PCR 0.040 from 09/03/19.  She presents for a follow up appointment.      Kimberly Mathis comes to clinic today alone without a caregiver and unfortunately she is unable to give me a thorough history. She is perseverating on a recent increase in her frequency of urinary incontinence. Today she is afebrile, without a leukocytosis on CBC and denies any dysuria or hematuria when asked repeatedly. It is unclear how acute/chronic this issue may be. Pt is wearing depends today. Per records she has been continued on Gleevec.  She currently resides in a long term care facility that provides her with her medications on a daily basis. Based on the documentation provided today, it is also unclear if she has received the complete 2 stage COVID 19 vaccine + booster as recommended. Overall her labs appear to be stable today though BCR/ABL PCR remains pending.  As long as she continues to be in MMR we will plan to see her again in 3 months.       Plan and Recommendations:  - continue Imatinib 400 mg daily  - please ensure pt receives COVID vaccine x 3 doses  - recommend PCM evaluation for urinary incontinence  - RTC in 3 months with BCR/ABL PCR lab check      Isa Rankin, MD  H/O PGY 5    Seen and discussed with Dr. Vertell Limber    History of Present Illness:  Oncology History Overview Note   Diagnosis: CML    SOKAL Score:    Presentation: asymptomatic; leukocytosis found on routine CBC    Presenting WBC Count: 17,400    Bone Marrow Biopsy: not performed as patient unable to tolerate procedure.    Cytogenetics:  Abnormal FISH: An interphase FISH assay shows an abnormal signal pattern consistent with BCR/ABL1 fusion in 44% of the 100 cells scored. Of note, these cells contain an atypical abnormal signal pattern consistent with BCR/ABL1 rearrangement and loss of the ABL1/ASS1 region.    Treatment:  Imatinib 400 mg daily- 12/31/2015    12/31/15:  BCR-ABL1 p210 transcripts were detected at a level of 9.511 IS% ratio in blood.  BCR-ABL1 p190 transcripts were detected at a level of 3 in 100,000 cells in blood.    03/23/16:  BCR-ABL1 p210 transcripts were detected at a level of 0.702 IS % ratio in blood.    Normal FISH:  A BCR/ABL1 interphase FISH assay for the previously documented 9;22 translocation shows a normal signal pattern in 100% of the 200 nuclei scored     06/15/16:  BCR-ABL1 p210 transcripts were detected  at a level of 0.272 IS % ratio in blood.    09/2016:  0.298 IS %    12/18: 0.241 IS%    03/22/17: 0.072%    06/21/17: 0.052%    09/27/17: 0.125%    01/03/18: 0.040%    05/28/18: 0.008%    09/13/18: 0.003%    01/14/2019: 0.181%  02/05/2019: 0.149%  04/10/2019: 0.037%       CML (chronic myelocytic leukemia) (CMS-HCC)   12/28/2015 Initial Diagnosis    CML (chronic myelocytic leukemia) (RAF-HCC)       Interval History:  Kimberly Mathis comes to clinic today unaccompanied.  She is quite tangential and continues to perseverate on the fact that she is hungry and has to spend several hours in wet clothes after episodes of incontinence. Her mobility is quite limited and I feel as though her incontinence is mainly 2/2 inability to make it to the bathroom on time vs true incontinence vs UTI. She is able to state that her medications are provided for her by the staff at the assisted living facility where she lives. Imatinib 400 mg daily is on her med list. Given her schizoaffective disorder she is unable to provide me with complete history or ROS.  She denies any sob, n/v/d, or new rashes, swelling. No fever, chills, ab/pelvic pain, dysuria, or hematuria.       Past Medical, Surgical and Family History were reviewed and pertinent updates were made in the Electronic Medical Record    Review of Systems:  All other systems reviewed were negative.     ECOG Performance Status: 3    Past Medical History:  Past Medical History:   Diagnosis Date   ??? GERD (gastroesophageal reflux disease)    ??? Gout    ??? HTN (hypertension)    ??? Morbid obesity (CMS-HCC)    ??? Osteoporosis    ??? Schizoaffective disorder, bipolar type (CMS-HCC)    ??? Type II diabetes mellitus (CMS-HCC)        Medications:    Current Outpatient Medications   Medication Sig Dispense Refill   ??? acetaminophen (TYLENOL) 325 MG tablet acetaminophen 325 mg tablet   TAKE 3 TABLETS BY MOUTH TWICE DAILY.     ??? allopurinoL (ZYLOPRIM) 100 MG tablet Take 100 mg by mouth daily.     ??? clozapine (CLOZARIL) 100 MG tablet Take 100 mg by mouth daily.     ??? cloZAPine (CLOZARIL) 50 MG tablet Take 3 tablets (150 mg total) by mouth daily. 90 tablet 0   ??? cloZAPine (CLOZARIL) 50 MG tablet Take 50 mg by mouth daily.     ??? diclofenac sodium (VOLTAREN) 1 % gel      ??? divalproex (DEPAKOTE) 250 MG DR tablet Take 250 mg by mouth Three (3) times a day.     ??? divalproex (DEPAKOTE) 500 MG DR tablet Take 2 tablets (1,000 mg total) by mouth nightly. (Patient taking differently: Take 750 mg by mouth nightly. ) 60 tablet 0   ??? docusate sodium (COLACE) 100 MG capsule Take 100 mg by mouth Two (2) times a day.     ??? famotidine (PEPCID) 40 MG tablet      ??? folic acid (FOLVITE) 1 MG tablet Take 1 mg by mouth daily.     ??? glycopyrrolate (ROBINUL) 1 mg tablet Take 1 mg by mouth Two (2) times a day.     ??? imatinib (GLEEVEC) 400 MG tablet Take 1 tablet (400 mg total) by mouth daily. 30 tablet 11   ???  lisinopriL (PRINIVIL,ZESTRIL) 2.5 MG tablet Take 2.5 mg by mouth daily.     ??? loperamide (IMODIUM) 2 mg capsule loperamide 2 mg capsule   Take 1 capsule 3 times a day by oral route as needed for 30 days.     ??? metFORMIN (GLUMETZA) 1000 MG (MOD) 24 hr tablet Take 1,000 mg by mouth every morning before breakfast.     ??? metoprolol succinate (TOPROL-XL) 100 MG 24 hr tablet Take 1 tablet (100 mg total) by mouth daily. 30 tablet 0   ??? metoprolol succinate (TOPROL-XL) 100 MG 24 hr tablet Take 100 mg by mouth daily.     ??? multiple vitamin with vit K, adult, (MULTIPLE VITAMIN) 3,300 unit- 150 mcg/10 mL injection Infuse into a venous catheter.     ??? multivitamin capsule Take 1 capsule by mouth daily.     ??? pravastatin (PRAVACHOL) 10 MG tablet Take 1 tablet (10 mg total) by mouth daily. (Patient taking differently: Take 20 mg by mouth daily. ) 30 tablet 0   ??? pravastatin (PRAVACHOL) 20 MG tablet Take 20 mg by mouth daily.     ??? pyridoxine, vitamin B6, (B-6) 100 MG tablet Take 200 mg by mouth daily.     ??? sennosides (SENOKOT EXTRA STRENGTH) 17.2 mg Tab Continuous.     ??? zinc sulfate 220 mg Tab tablet Take 1 tablet (220 mg total) by mouth daily. 30 tablet 0     No current facility-administered medications for this visit.     Vital Signs:  BSA: 2.48 meters squared  Vitals:    12/04/19 1018   BP: 120/69   Pulse: 102   Resp: 18   Temp: 35.6 ??C (96 ??F)   SpO2: 98%     Physical Exam:  Constitutional: Obese woman sitting in wheelchair, no acute distress  Eyes: PERRL. No scleral icterus or conjunctival injection.  Cardiovascular:  RRR.  S1, S2.  No murmurs, gallops or rubs.  No edema.  Respiratory:  Breathing is unlabored.  CTAB. No rales, ronchi or crackles.    GI:  No distention or pain on palpation.  Bowel sounds are present.  No palpable masses.  Musculoskeletal:   Unable to evaluate  Skin:  No rashes, petechiae or purpura.  grossly intact.   Neurologic:  alert and oriented to person.   Psychiatric: tangential requiring frequent re-direction    Relevant Laboratory, radiology and pathology results:  Lab on 12/04/2019   Component Date Value Ref Range Status   ??? Sodium 12/04/2019 139  135 - 145 mmol/L Final   ??? Potassium 12/04/2019 4.0  3.4 - 4.5 mmol/L Final   ??? Chloride 12/04/2019 103  98 - 107 mmol/L Final   ??? Anion Gap 12/04/2019 6  5 - 14 mmol/L Final   ??? CO2 12/04/2019 30.0  20.0 - 31.0 mmol/L Final   ??? BUN 12/04/2019 8* 9 - 23 mg/dL Final   ??? Creatinine 12/04/2019 0.51* 0.60 - 0.80 mg/dL Final   ??? BUN/Creatinine Ratio 12/04/2019 16   Final   ??? EGFR CKD-EPI Non-African American,* 12/04/2019 >90  >=60 mL/min/1.25m2 Final   ??? EGFR CKD-EPI African American, Fem* 12/04/2019 >90  >=60 mL/min/1.66m2 Final   ??? Glucose 12/04/2019 103  70 - 179 mg/dL Final   ??? Calcium 18/84/1660 9.7  8.7 - 10.4 mg/dL Final   ??? Albumin 63/01/6008 4.3  3.4 - 5.0 g/dL Final   ??? Total Protein 12/04/2019 8.3* 5.7 - 8.2 g/dL Final   ??? Total Bilirubin 12/04/2019 0.2* 0.3 -  1.2 mg/dL Final   ??? AST 16/10/9602 16  <=34 U/L Final   ??? ALT 12/04/2019 10  10 - 49 U/L Final   ??? Alkaline Phosphatase 12/04/2019 70  46 - 116 U/L Final   ??? Collection 12/04/2019 Collected   Final   ??? WBC 12/04/2019 10.8  4.5 - 11.0 10*9/L Final   ??? RBC 12/04/2019 3.64* 4.00 - 5.20 10*12/L Final   ??? HGB 12/04/2019 11.1* 12.0 - 16.0 g/dL Final   ??? HCT 54/09/8117 34.8* 36.0 - 46.0 % Final   ??? MCV 12/04/2019 95.6  80.0 - 100.0 fL Final   ??? MCH 12/04/2019 30.4  26.0 - 34.0 pg Final   ??? MCHC 12/04/2019 31.8  31.0 - 37.0 g/dL Final   ??? RDW 14/78/2956 14.7  12.0 - 15.0 % Final   ??? MPV 12/04/2019 8.2  7.0 - 10.0 fL Final   ??? Platelet 12/04/2019 440  150 - 440 10*9/L Final   ??? Neutrophils % 12/04/2019 50.8  % Final   ??? Lymphocytes % 12/04/2019 38.8  % Final   ??? Monocytes % 12/04/2019 5.6  % Final   ??? Eosinophils % 12/04/2019 2.5  % Final   ??? Basophils % 12/04/2019 0.5  % Final   ??? Absolute Neutrophils 12/04/2019 5.5  2.0 - 7.5 10*9/L Final   ??? Absolute Lymphocytes 12/04/2019 4.2  1.5 - 5.0 10*9/L Final   ??? Absolute Monocytes 12/04/2019 0.6  0.2 - 0.8 10*9/L Final   ??? Absolute Eosinophils 12/04/2019 0.3  0.0 - 0.4 10*9/L Final   ??? Absolute Basophils 12/04/2019 0.1  0.0 - 0.1 10*9/L Final   ??? Large Unstained Cells 12/04/2019 2  0 - 4 % Final   ??? Macrocytosis 12/04/2019 Slight* Not Present Final   ??? Hypochromasia 12/04/2019 Moderate* Not Present Final

## 2019-12-04 NOTE — Unmapped (Addendum)
Requested by Dr. Cindra Presume to research if patient had COVID-19 Vaccines, enter that data, and provide recommendations to caregiver at group home for possible need for additional vaccinations.    His request:  Can you please check with this patient's group home to get dates of her COVID vaccine? We will need to enter it into the system. I would also recommend that she receive 3 doses of mRNA vaccine so if she has only had 2 doses of Pfizer or Moderna then I would recommend that she receive a 3rd dose given her immunosuppression.     Actions:    1. 12/04/2019:  Called Kimberly Mathis and left message to call back with best time to call her to speak directly with her.    2. 12/05/2019:   Spoke directly with Kimberly Mathis who clarified that patient no longer under her care.  She thinks patient is under the care of Kimberly Mathis of [facility] Tel [phone] and advised me to call her.  She did provide data that confirmed patient had 2 COVID-19 Vaccinations which I will enter into EMR today.  Pt received 2 doses of Moderna brand on 01/14/2019 and 02/10/2019 and was not sure if she received booster yet.    3. I left message for Ms. Kimberly Mathis to call us back so I can review advice in regards to possibly needed booster if patient has not received it.  Awaiting callback.    Kimberly Mathis  Oncology Nurse Educator  [Covering clinical nurse navigation 12/04/2019-12/05/2019]

## 2019-12-04 NOTE — Unmapped (Signed)
I saw and evaluated the patient, and participated in the key portions of the service.  This is a highly complex patient which requires my evaluation and assessment. I reviewed the housestaff note below and agree with the plan. Additional findings are as follows:    Kimberly Mathis is a 54 yo F who resides in a group home with CP-CML with stable MMR on imatinib 400 mg daily. BCR-ABL1 PCR pending today. I recommended continuing imatinib 400 mg daily with follow ups every 3 months. I will check with group home about influenza vaccine and COVID vaccination for 3 doses given her immunosuppression.     Plans and Recommendations:  1) Cont. Imatinib 400 mg daily  2) Check with group home about influenza vaccine and 3 doses of mRNA covid vaccine  3) RTC in 3 months    Oncology History Overview Note   Diagnosis: CML    SOKAL Score:    Presentation: asymptomatic; leukocytosis found on routine CBC    Presenting WBC Count: 17,400    Bone Marrow Biopsy: not performed as patient unable to tolerate procedure.    Cytogenetics:  Abnormal FISH: An interphase FISH assay shows an abnormal signal pattern consistent with BCR/ABL1 fusion in 44% of the 100 cells scored. Of note, these cells contain an atypical abnormal signal pattern consistent with BCR/ABL1 rearrangement and loss of the ABL1/ASS1 region.    Treatment:  Imatinib 400 mg daily- 12/31/2015    12/31/15:  BCR-ABL1 p210 transcripts were detected at a level of 9.511 IS% ratio in blood.  BCR-ABL1 p190 transcripts were detected at a level of 3 in 100,000 cells in blood.    03/23/16:  BCR-ABL1 p210 transcripts were detected at a level of 0.702 IS % ratio in blood.    Normal FISH:  A BCR/ABL1 interphase FISH assay for the previously documented 9;22 translocation shows a normal signal pattern in 100% of the 200 nuclei scored     06/15/16:  BCR-ABL1 p210 transcripts were detected at a level of 0.272 IS % ratio in blood.    09/2016:  0.298 IS %    12/18: 0.241 IS%    03/22/17: 0.072%    06/21/17: 0.052%    09/27/17: 0.125%    01/03/18: 0.040%    05/28/18: 0.008%    09/13/18: 0.003%    01/14/2019: 0.181%  02/05/2019: 0.149%  04/10/2019: 0.037%       CML (chronic myelocytic leukemia) (CMS-HCC)   12/28/2015 Initial Diagnosis    CML (chronic myelocytic leukemia) (RAF-HCC)           Kimberly Presume, MD

## 2019-12-04 NOTE — Unmapped (Addendum)
CML appears to be stable though BCR/ABL PCR is pending. Will follow up if any changes. RTC in 3months.    Please continue to take Imatinib 400 mg daily    We would also recommend 3 doses of the Pfizer or Moderna COVID 19 vaccine if not administered already.    Pt was complaining of increased urinary frequency/incontinence w/o fever, dysuria, hematuria. Unclear if this is acute vs chronic. Recommend PCM follow up.    If you have questions or concerns at night or on the weekend, call the hospital operator at [phone] and ask to speak to the hematology/oncology fellow on call.  ??  If you have questions or concerns on a week day during business hours, you may call the Nurse call line at [phone].    Lab on 12/04/2019   Component Date Value Ref Range Status   ??? WBC 12/04/2019 10.8  4.5 - 11.0 10*9/L Final   ??? RBC 12/04/2019 3.64* 4.00 - 5.20 10*12/L Final   ??? HGB 12/04/2019 11.1* 12.0 - 16.0 g/dL Final   ??? HCT 29/56/2130 34.8* 36.0 - 46.0 % Final   ??? MCV 12/04/2019 95.6  80.0 - 100.0 fL Final   ??? MCH 12/04/2019 30.4  26.0 - 34.0 pg Final   ??? MCHC 12/04/2019 31.8  31.0 - 37.0 g/dL Final   ??? RDW 86/57/8469 14.7  12.0 - 15.0 % Final   ??? MPV 12/04/2019 8.2  7.0 - 10.0 fL Final   ??? Platelet 12/04/2019 440  150 - 440 10*9/L Final   ??? Neutrophils % 12/04/2019 50.8  % Final   ??? Lymphocytes % 12/04/2019 38.8  % Final   ??? Monocytes % 12/04/2019 5.6  % Final   ??? Eosinophils % 12/04/2019 2.5  % Final   ??? Basophils % 12/04/2019 0.5  % Final   ??? Absolute Neutrophils 12/04/2019 5.5  2.0 - 7.5 10*9/L Final   ??? Absolute Lymphocytes 12/04/2019 4.2  1.5 - 5.0 10*9/L Final   ??? Absolute Monocytes 12/04/2019 0.6  0.2 - 0.8 10*9/L Final   ??? Absolute Eosinophils 12/04/2019 0.3  0.0 - 0.4 10*9/L Final   ??? Absolute Basophils 12/04/2019 0.1  0.0 - 0.1 10*9/L Final   ??? Large Unstained Cells 12/04/2019 2  0 - 4 % Final   ??? Macrocytosis 12/04/2019 Slight* Not Present Final   ??? Hypochromasia 12/04/2019 Moderate* Not Present Final

## 2019-12-08 NOTE — Unmapped (Addendum)
Patient had 2 COVID-19 vaccines, Moderna, which are documented in Epic.     12/06, 10:00 am: Spoke with Mylinda Latina at  [facility]; she will investigate whether they have given dose 3; and call back with update. Direct phone # provided.     Lucia Gaskins RN

## 2019-12-09 NOTE — Unmapped (Signed)
Patient has had 2 COVID-19 vaccines, and these were documented in EMR. ??     12/09/19:  Received call back from Mylinda Latina at [facility], ph # [phone].  Alisha confirmed, patient has not received a 3rd / booster dose.  She has initiated a request to get this done at [facility]; it may take a few weeks to coordinate the order thru their visiting pharmacy services.       NN team will  follow-up week of  01/05/20 per phone call to obtain vaccine admin date for [facility] EMR. Reminder created.     Lucia Gaskins RN  Per diem NN

## 2019-12-22 NOTE — Unmapped (Signed)
[facility] Refill Coordination Note    Specialty Medication(s) to be Shipped:   Hematology/Oncology: Imatinib    Other medication(s) to be shipped: No additional medications requested for fill at this time     Kimberly Mathis, DOB: 04-23-1965  Phone: [phone] (home)       All above HIPAA information was verified with patient's caregiver, Kimberly Mathis     Was a translator used for this call? No    Completed refill call assessment today to schedule patient's medication shipment from the [facility] [phone]).       Specialty medication(s) and dose(s) confirmed: Regimen is correct and unchanged.   Changes to medications: Kimberly Mathis reports no changes at this time.  Changes to insurance: No  Questions for the pharmacist: No    Confirmed patient received Welcome Packet with first shipment. The patient will receive a drug information handout for each medication shipped and additional FDA Medication Guides as required.       DISEASE/MEDICATION-SPECIFIC INFORMATION        N/A    SPECIALTY MEDICATION ADHERENCE     Medication Adherence    Patient reported X missed doses in the last month: 0  Specialty Medication: Imatinib 400mg   Patient is on additional specialty medications: No  Informant: caregiver  Support network for adherence: home health agency                Imatinib 400 mg: 10 days of medicine on hand         SHIPPING     Shipping address confirmed in Epic.     Delivery Scheduled: Yes, Expected medication delivery date: 12/30/19.     Medication will be delivered via Next Day Courier to the prescription address in Epic [place].    Kimberly Mathis   [facility] Specialty Technician

## 2019-12-23 ENCOUNTER — Ambulatory Visit: Admit: 2019-12-23 | Discharge: 2019-12-23 | Disposition: A | Discharge status: Home Health | Payer: MEDICARE

## 2019-12-23 DIAGNOSIS — E119 Type 2 diabetes mellitus without complications: Principal | ICD-10-CM

## 2019-12-23 DIAGNOSIS — R112 Nausea with vomiting, unspecified: Principal | ICD-10-CM

## 2019-12-23 DIAGNOSIS — Z7984 Long term (current) use of oral hypoglycemic drugs: Principal | ICD-10-CM

## 2019-12-23 DIAGNOSIS — Z79899 Other long term (current) drug therapy: Principal | ICD-10-CM

## 2019-12-23 DIAGNOSIS — Z6841 Body Mass Index (BMI) 40.0 and over, adult: Principal | ICD-10-CM

## 2019-12-23 DIAGNOSIS — I10 Essential (primary) hypertension: Principal | ICD-10-CM

## 2019-12-23 DIAGNOSIS — R1110 Vomiting, unspecified: Principal | ICD-10-CM

## 2019-12-23 DIAGNOSIS — Z87891 Personal history of nicotine dependence: Principal | ICD-10-CM

## 2019-12-23 DIAGNOSIS — F250 Schizoaffective disorder, bipolar type: Principal | ICD-10-CM

## 2019-12-23 DIAGNOSIS — M810 Age-related osteoporosis without current pathological fracture: Principal | ICD-10-CM

## 2019-12-23 DIAGNOSIS — M109 Gout, unspecified: Principal | ICD-10-CM

## 2019-12-23 DIAGNOSIS — Z791 Long term (current) use of non-steroidal anti-inflammatories (NSAID): Principal | ICD-10-CM

## 2019-12-23 LAB — CBC W/ AUTO DIFF
BASOPHILS ABSOLUTE COUNT: 0.1 10*9/L (ref 0.0–0.1)
BASOPHILS RELATIVE PERCENT: 0.7 %
EOSINOPHILS ABSOLUTE COUNT: 0.2 10*9/L (ref 0.0–0.7)
EOSINOPHILS RELATIVE PERCENT: 1.5 %
HEMATOCRIT: 32.9 % — ABNORMAL LOW (ref 35.0–44.0)
HEMOGLOBIN: 10.4 g/dL — ABNORMAL LOW (ref 12.0–15.5)
LYMPHOCYTES ABSOLUTE COUNT: 2.8 10*9/L (ref 0.7–4.0)
LYMPHOCYTES RELATIVE PERCENT: 27.9 %
MEAN CORPUSCULAR HEMOGLOBIN CONC: 31.7 g/dL (ref 30.0–36.0)
MEAN CORPUSCULAR HEMOGLOBIN: 28.6 pg (ref 26.0–34.0)
MEAN CORPUSCULAR VOLUME: 90.2 fL (ref 82.0–98.0)
MEAN PLATELET VOLUME: 7.4 fL (ref 7.0–10.0)
MONOCYTES ABSOLUTE COUNT: 0.8 10*9/L (ref 0.1–1.0)
MONOCYTES RELATIVE PERCENT: 7.9 %
NEUTROPHILS ABSOLUTE COUNT: 6.3 10*9/L (ref 1.7–7.7)
NEUTROPHILS RELATIVE PERCENT: 62 %
PLATELET COUNT: 349 10*9/L (ref 150–450)
RED BLOOD CELL COUNT: 3.65 10*12/L — ABNORMAL LOW (ref 3.90–5.03)
RED CELL DISTRIBUTION WIDTH: 14.5 % (ref 12.0–15.0)
WBC ADJUSTED: 10.2 10*9/L (ref 3.5–10.5)

## 2019-12-23 LAB — COMPREHENSIVE METABOLIC PANEL
ALBUMIN: 3.8 g/dL (ref 3.4–5.0)
ALKALINE PHOSPHATASE: 61 U/L (ref 46–116)
ALT (SGPT): 15 U/L (ref 7–40)
ANION GAP: 11 mmol/L (ref 7–24)
AST (SGOT): 6 U/L — ABNORMAL LOW (ref 13–40)
BILIRUBIN TOTAL: 0.2 mg/dL — ABNORMAL LOW (ref 0.3–1.2)
BLOOD UREA NITROGEN: 10 mg/dL (ref 9–23)
BUN / CREAT RATIO: 19
CALCIUM: 9 mg/dL (ref 8.7–10.4)
CHLORIDE: 101 mmol/L (ref 98–107)
CO2: 31.7 mmol/L — ABNORMAL HIGH (ref 20.0–31.0)
CREATININE: 0.53 mg/dL (ref 0.50–0.80)
EGFR CKD-EPI AA FEMALE: >90 mL/min/{1.73_m2} (ref >=60)
EGFR CKD-EPI NON-AA FEMALE: >90 mL/min/{1.73_m2} (ref >=60)
GLUCOSE RANDOM: 104 mg/dL (ref 70–179)
POTASSIUM: 3.8 mmol/L (ref 3.5–5.1)
PROTEIN TOTAL: 8.1 g/dL (ref 5.7–8.2)
SODIUM: 144 mmol/L (ref 136–145)

## 2019-12-23 LAB — URINALYSIS
BACTERIA: NONE SEEN /HPF
BILIRUBIN UA: NEGATIVE
BLOOD UA: NEGATIVE
GLUCOSE UA: NEGATIVE
KETONES UA: NEGATIVE
LEUKOCYTE ESTERASE UA: NEGATIVE
NITRITE UA: NEGATIVE
PH UA: 7
PROTEIN UA: NEGATIVE
RBC UA: <1 /HPF (ref 0–3)
SPECIFIC GRAVITY UA: 1.004 — ABNORMAL LOW (ref 1.005–1.030)
SQUAMOUS EPITHELIAL: 0 /HPF (ref 0–5)
UROBILINOGEN UA: NEGATIVE
WBC UA: <1 /HPF (ref 0–3)

## 2019-12-23 LAB — LIPASE: LIPASE: 61 U/L — ABNORMAL LOW (ref 73–393)

## 2019-12-23 MED ORDER — ONDANSETRON 4 MG DISINTEGRATING TABLET
ORAL_TABLET | Freq: Three times a day (TID) | ORAL | 0 refills | 5 days | Status: CP | PRN
Start: 2019-12-23 — End: 2019-12-30

## 2019-12-23 MED ADMIN — sodium chloride 0.9% (NS) bolus 1,000 mL: 1000 mL | INTRAVENOUS | @ 17:00:00 | Stop: 2019-12-23

## 2019-12-23 NOTE — Unmapped (Signed)
EMS reports pt had 1 episode of vomiting at the nursing home.  Pt offered zofran at NH but refused stating she wants to be treated at the hospital because everyone else does.

## 2019-12-23 NOTE — Unmapped (Signed)
Bed: 23  Expected date:   Expected time:   Means of arrival:   Comments:  EMS - N/V from SNF

## 2019-12-23 NOTE — Unmapped (Signed)
Left a VM for Mylinda Latina, core coordinator at the [facility] ph # [phone].      Calling to follow-up, [facility] previously advised that the patient receive her 3rd Covid booster.  Once given, [place] requesting the date of administration to update pt's immunization records.    Incidental finding:  Per chart review,  Pt transported to [facility] ED from SNF per EMS today, QQ:[id number] x 2.     Lucia Gaskins RN  Per diem NN

## 2019-12-23 NOTE — Unmapped (Signed)
EMERGENCY DEPARTMENT ENCOUNTER                CHIEF COMPLAINT    Chief Complaint   Patient presents with   ??? Emesis       HPI    Kimberly Mathis is a 54 y.o. female with a medical history including hypertension, diabetes, GERD, osteoporosis, obesity, and schizoaffective disorder, bipolar type who presents to the Emergency Department with complaint of head congestion, nausea, and vomiting. The symptoms began today. Patient vomited twice. She reportedly felt weak and needed assistance from EMS to get onto the stretcher. She was fortunately able to transfer unassisted once in this facility, however. She denies headache, dizziness, chest pain, shortness of breath, cough, wheezing, abdominal pain, diarrhea, constipation. No other complaints. Per patient's nurse, patient is at baseline mentation and activity per EMS.    PERTINENT HISTORY:    PAST MEDICAL HISTORY    Past Medical History:   Diagnosis Date   ??? GERD (gastroesophageal reflux disease)    ??? Gout    ??? HTN (hypertension)    ??? Morbid obesity (CMS-HCC)    ??? Osteoporosis    ??? Schizoaffective disorder, bipolar type (CMS-HCC)    ??? Type II diabetes mellitus (CMS-HCC)          SURGICAL HISTORY  No past surgical history on file.      CURRENT MEDICATIONS    No current facility-administered medications for this encounter.    Current Outpatient Medications:   ???  acetaminophen (TYLENOL) 325 MG tablet, acetaminophen 325 mg tablet  TAKE 3 TABLETS BY MOUTH TWICE DAILY., Disp: , Rfl:   ???  allopurinoL (ZYLOPRIM) 100 MG tablet, Take 100 mg by mouth daily., Disp: , Rfl:   ???  clozapine (CLOZARIL) 100 MG tablet, Take 100 mg by mouth daily., Disp: , Rfl:   ???  cloZAPine (CLOZARIL) 50 MG tablet, Take 3 tablets (150 mg total) by mouth daily., Disp: 90 tablet, Rfl: 0  ???  cloZAPine (CLOZARIL) 50 MG tablet, Take 50 mg by mouth daily., Disp: , Rfl:   ???  diclofenac sodium (VOLTAREN) 1 % gel, , Disp: , Rfl:   ???  divalproex (DEPAKOTE) 250 MG DR tablet, Take 250 mg by mouth Three (3) times a day., Disp: , Rfl:   ???  divalproex (DEPAKOTE) 500 MG DR tablet, Take 2 tablets (1,000 mg total) by mouth nightly. (Patient taking differently: Take 750 mg by mouth nightly. ), Disp: 60 tablet, Rfl: 0  ???  docusate sodium (COLACE) 100 MG capsule, Take 100 mg by mouth Two (2) times a day., Disp: , Rfl:   ???  famotidine (PEPCID) 40 MG tablet, , Disp: , Rfl:   ???  folic acid (FOLVITE) 1 MG tablet, Take 1 mg by mouth daily., Disp: , Rfl:   ???  glycopyrrolate (ROBINUL) 1 mg tablet, Take 1 mg by mouth Two (2) times a day., Disp: , Rfl:   ???  imatinib (GLEEVEC) 400 MG tablet, Take 1 tablet (400 mg total) by mouth daily., Disp: 30 tablet, Rfl: 11  ???  lisinopriL (PRINIVIL,ZESTRIL) 2.5 MG tablet, Take 2.5 mg by mouth daily., Disp: , Rfl:   ???  loperamide (IMODIUM) 2 mg capsule, loperamide 2 mg capsule  Take 1 capsule 3 times a day by oral route as needed for 30 days., Disp: , Rfl:   ???  metFORMIN (GLUMETZA) 1000 MG (MOD) 24 hr tablet, Take 1,000 mg by mouth every morning before breakfast., Disp: , Rfl:   ???  metoprolol succinate (TOPROL-XL) 100 MG 24 hr tablet, Take 1 tablet (100 mg total) by mouth daily., Disp: 30 tablet, Rfl: 0  ???  metoprolol succinate (TOPROL-XL) 100 MG 24 hr tablet, Take 100 mg by mouth daily., Disp: , Rfl:   ???  multiple vitamin with vit K, adult, (MULTIPLE VITAMIN) 3,300 unit- 150 mcg/10 mL injection, Infuse into a venous catheter., Disp: , Rfl:   ???  multivitamin capsule, Take 1 capsule by mouth daily., Disp: , Rfl:   ???  ondansetron (ZOFRAN-ODT) 4 MG disintegrating tablet, Take 1 tablet (4 mg total) by mouth every eight (8) hours as needed for nausea for up to 7 days., Disp: 14 tablet, Rfl: 0  ???  pravastatin (PRAVACHOL) 10 MG tablet, Take 1 tablet (10 mg total) by mouth daily. (Patient taking differently: Take 20 mg by mouth daily. ), Disp: 30 tablet, Rfl: 0  ???  pravastatin (PRAVACHOL) 20 MG tablet, Take 20 mg by mouth daily., Disp: , Rfl:   ???  pyridoxine, vitamin B6, (B-6) 100 MG tablet, Take 200 mg by mouth daily., Disp: , Rfl:   ???  sennosides (SENOKOT EXTRA STRENGTH) 17.2 mg Tab, Continuous., Disp: , Rfl:   ???  zinc sulfate 220 mg Tab tablet, Take 1 tablet (220 mg total) by mouth daily., Disp: 30 tablet, Rfl: 0      ALLERGIES    No Known Allergies      FAMILY HISTORY    History reviewed. No pertinent family history.      SOCIAL HISTORY    Social History     Socioeconomic History   ??? Marital status: Single     Spouse name: None   ??? Number of children: None   ??? Years of education: None   ??? Highest education level: None   Occupational History   ??? None   Tobacco Use   ??? Smoking status: Former Smoker     Packs/day: 1.00     Types: Cigarettes   ??? Smokeless tobacco: Never Used   Substance and Sexual Activity   ??? Alcohol use: Not Currently   ??? Drug use: Not Currently   ??? Sexual activity: None   Other Topics Concern   ??? None   Social History Narrative   ??? None     Social Determinants of Health     Financial Resource Strain: Not on file   Food Insecurity: Not on file   Transportation Needs: Not on file   Physical Activity: Not on file   Stress: Not on file   Social Connections: Not on file         REVIEW OF SYSTEMS    As reported in HPI.  All remaining systems negative unless otherwise noted in HPI.      PHYSICAL EXAM    VITAL SIGNS: BP 146/91  - Pulse 119  - Temp 36.9 ??C (98.4 ??F)  - Resp 20  - Ht 172.7 cm (5' 8)  - Wt (!) 126.4 kg (278 lb 10.6 oz)  - BMI 42.37 kg/m??   General:  No acute distress.  HENT: Normocephalic, atraumatic, bilateral external ears normal, tympanic membranes clear bilaterally, nose normal, oropharynx moist.   Eyes:  PERRLA, EOMI, conjunctiva normal, no discharge.   Neck: Normal range of motion, supple.  Cardiovascular: Normal heart rate, normal rhythm, no murmurs, no rubs, no gallops.   Respiratory: Normal breath sounds, no respiratory distress, no rales or wheezes.  Skin: Warm, dry.  Abdomen: Abdomen soft, nontender, nondistended. Bowel sounds normoactive.  Extremities:  Full ROM UEs and LEs bilaterally. 5/5 strength in all extremities. Patient transferred from the EMS stretcher to the bed unassisted without difficulty.  Neurologic: Alert & interactive. Patient has slurred speech, otherwise no focal deficits noted. CN grossly intact without focal deficits.       LAB  Labs Reviewed   COMPREHENSIVE METABOLIC PANEL - Abnormal; Notable for the following components:       Result Value    CO2 31.7 (*)     Total Bilirubin 0.2 (*)     AST 6 (*)     All other components within normal limits   LIPASE - Abnormal; Notable for the following components:    Lipase 61 (*)     All other components within normal limits   URINALYSIS - Abnormal; Notable for the following components:    Specific Gravity, UA 1.004 (*)     All other components within normal limits   CBC W/ AUTO DIFF - Abnormal; Notable for the following components:    RBC 3.65 (*)     HGB 10.4 (*)     HCT 32.9 (*)     All other components within normal limits   CBC W/ DIFFERENTIAL    Narrative:     The following orders were created for panel order CBC w/ Differential.  Procedure                               Abnormality         Status                     ---------                               -----------         ------                     CBC w/ [id number]]         Abnormal            Final result                 Please view results for these tests on the individual orders.         RADIOLOGY    No orders to display         ED COURSE & MEDICAL DECISION MAKING      Patient remained stable and comfortable in the emergency department today.    The patient received the following medications during today's ED visit:  Medications   sodium chloride 0.9% (NS) bolus 1,000 mL (1,000 mL Intravenous New Bag 12/23/19 1137)       Patient's old records were reviewed. Patient was last seen at [facility] on 04/01/19 for pharyngitis.    Patient's vital signs were reviewed. She is hypertensive and tachycardic, otherwise vitals are normal. On exam patient has slurred speech which is reportedly baseline for her. Otherwise exam is unremarkable. She was evaluated with lab work that is unremarkable.  Patient symptoms have improved and she is requesting lunch.  She is stable for discharge home.  I will prescribe Zofran to take as needed.  Increase fluids and rest.  Follow-up with her PCP as needed.    Patient has no further questions, understands this plan, and is in agreement.    Patient's blood pressure  was noted to be elevated today. There is no evidence to suggest that failure to aggressively lower blood pressure in the emergency department/urgent care setting is associated with any short-term risk to a patient who presents with moderate to severe hypertension.  Patient should follow-up with the listed medical office or their PCP for further evaluation.        FINAL IMPRESSION    Final diagnoses:   Non-intractable vomiting with nausea, unspecified vomiting type (Primary)        Patient was placed on the following medications:  New Prescriptions    ONDANSETRON (ZOFRAN-ODT) 4 MG DISINTEGRATING TABLET    Take 1 tablet (4 mg total) by mouth every eight (8) hours as needed for nausea for up to 7 days.       Patient was instructed to follow up with the following provider:  Desiree Lucy, PA  [address]  [address]  [place] [address]  [phone]    Call   As needed          This ED note has been created using Dragon software. The note has been reviewed for accuracy, however errors may not always be identified. Such creation errors do NOT reflect on the standard of medical care rendered to this patient.     Barnie Mort [place], [place]  12/23/19 1339

## 2019-12-29 MED FILL — IMATINIB 400 MG TABLET: ORAL | 30 days supply | Qty: 30 | Fill #3

## 2019-12-29 MED FILL — IMATINIB 400 MG TABLET: 30 days supply | Qty: 30 | Fill #3 | Status: AC

## 2020-02-02 NOTE — Unmapped (Signed)
[facility] Clinical Assessment & Refill Coordination Note    Kimberly Mathis, DOB: 1965-05-08  Phone: [phone] (home)     All above HIPAA information was verified with patient's caregiver, Renee.     Was a Nurse, learning disability used for this call? No    Specialty Medication(s):   Hematology/Oncology: Imatinib     Current Outpatient Medications   Medication Sig Dispense Refill   ??? acetaminophen (TYLENOL) 325 MG tablet acetaminophen 325 mg tablet   TAKE 3 TABLETS BY MOUTH TWICE DAILY.     ??? allopurinoL (ZYLOPRIM) 100 MG tablet Take 100 mg by mouth daily.     ??? clozapine (CLOZARIL) 100 MG tablet Take 100 mg by mouth daily.     ??? cloZAPine (CLOZARIL) 50 MG tablet Take 3 tablets (150 mg total) by mouth daily. 90 tablet 0   ??? cloZAPine (CLOZARIL) 50 MG tablet Take 50 mg by mouth daily.     ??? diclofenac sodium (VOLTAREN) 1 % gel      ??? divalproex (DEPAKOTE) 250 MG DR tablet Take 250 mg by mouth Three (3) times a day.     ??? divalproex (DEPAKOTE) 500 MG DR tablet Take 2 tablets (1,000 mg total) by mouth nightly. (Patient taking differently: Take 750 mg by mouth nightly. ) 60 tablet 0   ??? docusate sodium (COLACE) 100 MG capsule Take 100 mg by mouth Two (2) times a day.     ??? famotidine (PEPCID) 40 MG tablet      ??? folic acid (FOLVITE) 1 MG tablet Take 1 mg by mouth daily.     ??? glycopyrrolate (ROBINUL) 1 mg tablet Take 1 mg by mouth Two (2) times a day.     ??? imatinib (GLEEVEC) 400 MG tablet Take 1 tablet (400 mg total) by mouth daily. 30 tablet 11   ??? lisinopriL (PRINIVIL,ZESTRIL) 2.5 MG tablet Take 2.5 mg by mouth daily.     ??? loperamide (IMODIUM) 2 mg capsule loperamide 2 mg capsule   Take 1 capsule 3 times a day by oral route as needed for 30 days.     ??? metFORMIN (GLUMETZA) 1000 MG (MOD) 24 hr tablet Take 1,000 mg by mouth every morning before breakfast.     ??? metoprolol succinate (TOPROL-XL) 100 MG 24 hr tablet Take 1 tablet (100 mg total) by mouth daily. 30 tablet 0   ??? metoprolol succinate (TOPROL-XL) 100 MG 24 hr tablet Take 100 mg by mouth daily.     ??? multiple vitamin with vit K, adult, (MULTIPLE VITAMIN) 3,300 unit- 150 mcg/10 mL injection Infuse into a venous catheter.     ??? multivitamin capsule Take 1 capsule by mouth daily.     ??? pravastatin (PRAVACHOL) 10 MG tablet Take 1 tablet (10 mg total) by mouth daily. (Patient taking differently: Take 20 mg by mouth daily. ) 30 tablet 0   ??? pravastatin (PRAVACHOL) 20 MG tablet Take 20 mg by mouth daily.     ??? pyridoxine, vitamin B6, (B-6) 100 MG tablet Take 200 mg by mouth daily.     ??? sennosides (SENOKOT EXTRA STRENGTH) 17.2 mg Tab Continuous.     ??? zinc sulfate 220 mg Tab tablet Take 1 tablet (220 mg total) by mouth daily. 30 tablet 0     No current facility-administered medications for this visit.        Changes to medications: Kimberly Mathis reports no changes at this time.    No Known Allergies    Changes to  allergies: No    SPECIALTY MEDICATION ADHERENCE     Imatinib 400 mg: 5 days of medicine on hand     Medication Adherence    Patient reported X missed doses in the last month: 0  Specialty Medication: imatinib 400 daily  Patient is on additional specialty medications: No  Informant: caregiver  Support network for adherence: home health agency  Confirmed plan for next specialty medication refill: delivery by pharmacy          Specialty medication(s) dose(s) confirmed: Regimen is correct and unchanged.     Are there any concerns with adherence? No    Adherence counseling provided? Not needed    CLINICAL MANAGEMENT AND INTERVENTION      Clinical Benefit Assessment:    Do you feel the medicine is effective or helping your condition? Yes    Clinical Benefit counseling provided? Not needed    Adverse Effects Assessment:    Are you experiencing any side effects? No    Are you experiencing difficulty administering your medicine? No    Quality of Life Assessment:    How many days over the past month did your CML  keep you from your normal activities? For example, brushing your teeth or getting up in the morning. 0    Have you discussed this with your provider? Not needed    Therapy Appropriateness:    Is therapy appropriate? Yes, therapy is appropriate and should be continued    DISEASE/MEDICATION-SPECIFIC INFORMATION      N/A    PATIENT SPECIFIC NEEDS     - Does the patient have any physical, cognitive, or cultural barriers? No    - Is the patient high risk? Yes, patient is taking oral chemotherapy. Appropriateness of therapy as been assessed    - Does the patient require a Care Management Plan? No     - Does the patient require physician intervention or other additional services (i.e. nutrition, smoking cessation, social work)? No      SHIPPING     Specialty Medication(s) to be Shipped:   Hematology/Oncology: Imatinib    Other medication(s) to be shipped: No additional medications requested for fill at this time     Changes to insurance: No    Delivery Scheduled: Yes, Expected medication delivery date: 02/05/20.     Medication will be delivered via Next Day Courier to the confirmed prescription address in [facility].    The patient will receive a drug information handout for each medication shipped and additional FDA Medication Guides as required.  Verified that patient has previously received a Conservation officer, historic buildings.    All of the patient's questions and concerns have been addressed.    Breck Coons Shared [facility] Specialty Pharmacist

## 2020-02-04 MED FILL — IMATINIB 400 MG TABLET: ORAL | 30 days supply | Qty: 30 | Fill #4

## 2020-02-11 NOTE — Unmapped (Signed)
12/09/19: ??Received call back from Mylinda Latina at [facility], ph # [phone]. ??patient has not received a 3rd / booster dose.   Elease Hashimoto has initiated a request to get this done on her end; it may take a few weeks to coordinate the order thru their visiting pharmacy services.   - NN will ??follow-up week of ??01/05/20 per phone call to obtain vaccine admin date for [facility] EMR. Reminder created.        02/11/20:  Follow-up call, Mylinda Latina will call me back.    Lucia Gaskins RN  Per diem NN

## 2020-03-01 NOTE — Unmapped (Signed)
[facility] Clinical Assessment & Refill Coordination Note    Kimberly Mathis, DOB: [birth year]  Phone: [phone] (home)     All above HIPAA information was verified with patient's caregiver[facility].     Was a Nurse, learning disability used for this call? No    Specialty Medication(s):   Hematology/Oncology: Imatinib     Current Outpatient Medications   Medication Sig Dispense Refill   ??? acetaminophen (TYLENOL) 325 MG tablet acetaminophen 325 mg tablet   TAKE 3 TABLETS BY MOUTH TWICE DAILY.     ??? allopurinoL (ZYLOPRIM) 100 MG tablet Take 100 mg by mouth daily.     ??? clozapine (CLOZARIL) 100 MG tablet Take 100 mg by mouth daily.     ??? cloZAPine (CLOZARIL) 50 MG tablet Take 3 tablets (150 mg total) by mouth daily. 90 tablet 0   ??? cloZAPine (CLOZARIL) 50 MG tablet Take 50 mg by mouth daily.     ??? diclofenac sodium (VOLTAREN) 1 % gel      ??? divalproex (DEPAKOTE) 250 MG DR tablet Take 250 mg by mouth Three (3) times a day.     ??? divalproex (DEPAKOTE) 500 MG DR tablet Take 2 tablets (1,000 mg total) by mouth nightly. (Patient taking differently: Take 750 mg by mouth nightly. ) 60 tablet 0   ??? docusate sodium (COLACE) 100 MG capsule Take 100 mg by mouth Two (2) times a day.     ??? famotidine (PEPCID) 40 MG tablet      ??? folic acid (FOLVITE) 1 MG tablet Take 1 mg by mouth daily.     ??? glycopyrrolate (ROBINUL) 1 mg tablet Take 1 mg by mouth Two (2) times a day.     ??? imatinib (GLEEVEC) 400 MG tablet Take 1 tablet (400 mg total) by mouth daily. 30 tablet 11   ??? lisinopriL (PRINIVIL,ZESTRIL) 2.5 MG tablet Take 2.5 mg by mouth daily.     ??? loperamide (IMODIUM) 2 mg capsule loperamide 2 mg capsule   Take 1 capsule 3 times a day by oral route as needed for 30 days.     ??? metFORMIN (GLUMETZA) 1000 MG (MOD) 24 hr tablet Take 1,000 mg by mouth every morning before breakfast.     ??? metoprolol succinate (TOPROL-XL) 100 MG 24 hr tablet Take 1 tablet (100 mg total) by mouth daily. 30 tablet 0 ??? metoprolol succinate (TOPROL-XL) 100 MG 24 hr tablet Take 100 mg by mouth daily.     ??? multiple vitamin with vit K, adult, (MULTIPLE VITAMIN) 3,300 unit- 150 mcg/10 mL injection Infuse into a venous catheter.     ??? multivitamin capsule Take 1 capsule by mouth daily.     ??? pravastatin (PRAVACHOL) 10 MG tablet Take 1 tablet (10 mg total) by mouth daily. (Patient taking differently: Take 20 mg by mouth daily. ) 30 tablet 0   ??? pravastatin (PRAVACHOL) 20 MG tablet Take 20 mg by mouth daily.     ??? pyridoxine, vitamin B6, (B-6) 100 MG tablet Take 200 mg by mouth daily.     ??? sennosides (SENOKOT EXTRA STRENGTH) 17.2 mg Tab Continuous.     ??? zinc sulfate 220 mg Tab tablet Take 1 tablet (220 mg total) by mouth daily. 30 tablet 0     No current facility-administered medications for this visit.        Changes to medications: Cerissa reports no changes at this time.    No Known Allergies  Changes to allergies: No    SPECIALTY MEDICATION ADHERENCE     imatinib 400 mg: 7-10 days of medicine on hand     Medication Adherence    Specialty Medication: Imatinib 400mg  daily  Patient is on additional specialty medications: No  Informant: caregiver  Support network for adherence: home health agency  Confirmed plan for next specialty medication refill: delivery by pharmacy          Specialty medication(s) dose(s) confirmed: Regimen is correct and unchanged.     Are there any concerns with adherence? No    Adherence counseling provided? Not needed    CLINICAL MANAGEMENT AND INTERVENTION      Clinical Benefit Assessment:    Do you feel the medicine is effective or helping your condition? Yes    Clinical Benefit counseling provided? Not needed    Adverse Effects Assessment:    Are you experiencing any side effects? No    Are you experiencing difficulty administering your medicine? No    Quality of Life Assessment:    How many days over the past month did your CML  keep you from your normal activities? For example, brushing your teeth or getting up in the morning. Patient declined to answer    Have you discussed this with your provider? Not needed    Therapy Appropriateness:    Is therapy appropriate? Yes, therapy is appropriate and should be continued    DISEASE/MEDICATION-SPECIFIC INFORMATION      N/A    PATIENT SPECIFIC NEEDS     - Does the patient have any physical, cognitive, or cultural barriers? Always speak with her caregiver for refill/clinical calls/questions    - Is the patient high risk? Yes, patient is taking oral chemotherapy. Appropriateness of therapy as been assessed    - Does the patient require a Care Management Plan? No     - Does the patient require physician intervention or other additional services (i.e. nutrition, smoking cessation, social work)? No      SHIPPING     Specialty Medication(s) to be Shipped:   Hematology/Oncology: Imatinib    Other medication(s) to be shipped: No additional medications requested for fill at this time     Changes to insurance: No    Delivery Scheduled: Yes, Expected medication delivery date: 03/09/20.     Medication will be delivered via Next Day Courier to the confirmed prescription address in [facility].    The patient will receive a drug information handout for each medication shipped and additional FDA Medication Guides as required.  Verified that patient has previously received a Conservation officer, historic buildings.    All of the patient's questions and concerns have been addressed.    Breck Coons Shared [facility] Specialty Pharmacist

## 2020-03-03 DIAGNOSIS — C9210 Chronic myeloid leukemia, BCR/ABL-positive, not having achieved remission: Principal | ICD-10-CM

## 2020-03-04 ENCOUNTER — Other Ambulatory Visit: Admit: 2020-03-04 | Discharge: 2020-03-05 | Payer: MEDICARE

## 2020-03-04 ENCOUNTER — Ambulatory Visit
Admit: 2020-03-04 | Discharge: 2020-03-05 | Payer: MEDICARE | Attending: Hematology & Oncology | Primary: Hematology & Oncology

## 2020-03-04 DIAGNOSIS — C9210 Chronic myeloid leukemia, BCR/ABL-positive, not having achieved remission: Principal | ICD-10-CM

## 2020-03-04 NOTE — Unmapped (Signed)
[facility] Follow-up    Patient Name: Kimberly Mathis  Patient Age: 55 y.o.  Encounter Date: 03/04/2020    Primary Care Provider:  Marcy Siren, PA    Referring Physician:  Virgil Benedict, AGNP  [address]  [id number]  [place], [place],  [place] [address]    Reason for visit:   F/u visit for [facility]    Assessment:  Kimberly Mathis is a 55 y.o. 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 She began treatment on imatinib in 12/2015 with optimal response and meeting appropriate treatment milestones, except her one year PCR, which was slightly above goal.  She has been in a MMR and MR4.0 since 05/2018 until Jan 2021 when she lost MMR d/t caregiver change and missing medication.  Since that time she was able to achieve MMR again with most recent BCR/ABL PCR 0.004 from 12/2019.  She presents for a follow up appointment.      Kimberly Mathis presents today again without a caregiver and cannot provide thorough history. She is afebrile and non toxic appearing. No complaints of profound or limiting fatigue. CBC is pending. BCR/ABL PCR pending today. Per records she has been continued on Gleevec. She is tolerating this well without complications or toxicity. She currently resides in a long term care facility that provides her with her medications on a daily basis. She reports that she is taking the medications that her group home provides. She is vaccinated for COVID x2 and recommended she receive the 3rd dose as soon as possible and then a 4th dose in 3 months after the 3rd. As long as she continues to be in MMR we will plan to see her again in 3 months.     Knee pain: having difficulty walking d/t pain, only taking Tylenol. Doing physical therapy at the group home. Recommend PCP follow up    COVID vaccination: Has received two doses of Moderna. I discussed with the patient the recommendation to receive a 3rd dose of mRNA vaccine. This guidance from the CDC is based on data suggesting impaired immunity in patients with cancer on active treatment including other patients who are highly immunosuppressed. We discussed the benefits and risks of receiving a 3rd dose mRNA vaccine and that given the surge of the delta variant, a 3rd dose may provide more overall protection against infection. I advised patient to continue wearing masks outside of house, avoiding contact with others, particularly those unvaccinated. Then 3 months after the 3rd dose she will need a 4th dose of an mRNA vaccine.     Plan and Recommendations:  - continue Imatinib 400 mg daily  - please ensure pt receives COVID vaccine x 4 doses   - 3rd dose as soon as possible   - 4th dose three months after the 3rd ddose   - recommend PCP evaluation for knee pain  - RTC in 3 months with BCR/ABL PCR lab check    Documentation assistance provided by Medical Scribe, Elaina Hoops Reitzel, who was present during the entirety of the visit. I reviewed the note below and validated all of the information provided to ensure accuracy and completeness.     Cindra Presume, MD    History of Present Illness:  Oncology History Overview Note   Diagnosis: CML    SOKAL Score:    Presentation: asymptomatic; leukocytosis found on routine CBC    Presenting WBC Count: 17,400  Bone Marrow Biopsy: not performed as patient unable to tolerate procedure.    Cytogenetics:  Abnormal FISH: An interphase FISH assay shows an abnormal signal pattern consistent with BCR/ABL1 fusion in 44% of the 100 cells scored. Of note, these cells contain an atypical abnormal signal pattern consistent with BCR/ABL1 rearrangement and loss of the ABL1/ASS1 region.    Treatment:  Imatinib 400 mg daily- 12/31/2015    12/31/15:  BCR-ABL1 p210 transcripts were detected at a level of 9.511 IS% ratio in blood.  BCR-ABL1 p190 transcripts were detected at a level of 3 in 100,000 cells in blood.    03/23/16:  BCR-ABL1 p210 transcripts were detected at a level of 0.702 IS % ratio in blood.    Normal FISH:  A BCR/ABL1 interphase FISH assay for the previously documented 9;22 translocation shows a normal signal pattern in 100% of the 200 nuclei scored     06/15/16:  BCR-ABL1 p210 transcripts were detected at a level of 0.272 IS % ratio in blood.    09/2016:  0.298 IS %    12/18: 0.241 IS%    03/22/17: 0.072%    06/21/17: 0.052%    09/27/17: 0.125%    01/03/18: 0.040%    05/28/18: 0.008%    09/13/18: 0.003%    01/14/2019: 0.181%  02/05/2019: 0.149%  04/10/2019: 0.037%       CML (chronic myelocytic leukemia) (CMS-HCC)   12/28/2015 Initial Diagnosis    CML (chronic myelocytic leukemia) (RAF-HCC)       Interval History:  Kimberly Mathis comes to clinic unaccompanied. In the interim she was seen in the ED for nausea and vomiting tx'd with IVF and antiemetics. Did not require admission. She does not have a caregiver to provide accurate history. She states I take what they give me. She asks about return appointments and assured her that we will set up follow up appointments. She reports that she cannot stand due to pain in her knees. Taking Tylenol for pain. Does not see a pain specialist or primary doctor. She is in physical therapy. Uses supported exercise bike. Walks with a cane. No fevers, chills, nausea, vomiting, diarrhea, abdominal pain, back pain, cough, congestion, night sweats, weight loss, new LAD, chest pain, shortness of breath, headaches, vision changes, or other present medical complaints.     Past Medical, Surgical and Family History were reviewed and pertinent updates were made in the Electronic Medical Record    Review of Systems:  All other systems reviewed were negative.     ECOG Performance Status: 3    Past Medical History:  Past Medical History:   Diagnosis Date   ??? GERD (gastroesophageal reflux disease)    ??? Gout    ??? HTN (hypertension)    ??? Morbid obesity (CMS-HCC)    ??? Osteoporosis    ??? Schizoaffective disorder, bipolar type (CMS-HCC)    ??? Type II diabetes mellitus (CMS-HCC)        Medications:    Current Outpatient Medications   Medication Sig Dispense Refill   ??? acetaminophen (TYLENOL) 325 MG tablet acetaminophen 325 mg tablet   TAKE 3 TABLETS BY MOUTH TWICE DAILY.     ??? allopurinoL (ZYLOPRIM) 100 MG tablet Take 100 mg by mouth daily.     ??? clozapine (CLOZARIL) 100 MG tablet Take 100 mg by mouth daily.     ??? cloZAPine (CLOZARIL) 50 MG tablet Take 50 mg by mouth daily.     ??? diclofenac sodium (VOLTAREN) 1 % gel      ???  divalproex (DEPAKOTE) 250 MG DR tablet Take 250 mg by mouth Three (3) times a day.     ??? docusate sodium (COLACE) 100 MG capsule Take 100 mg by mouth Two (2) times a day.     ??? famotidine (PEPCID) 40 MG tablet      ??? folic acid (FOLVITE) 1 MG tablet Take 1 mg by mouth daily.     ??? glycopyrrolate (ROBINUL) 1 mg tablet Take 1 mg by mouth Two (2) times a day.     ??? imatinib (GLEEVEC) 400 MG tablet Take 1 tablet (400 mg total) by mouth daily. 30 tablet 11   ??? lisinopriL (PRINIVIL,ZESTRIL) 2.5 MG tablet Take 2.5 mg by mouth daily.     ??? loperamide (IMODIUM) 2 mg capsule loperamide 2 mg capsule   Take 1 capsule 3 times a day by oral route as needed for 30 days.     ??? metFORMIN (GLUMETZA) 1000 MG (MOD) 24 hr tablet Take 1,000 mg by mouth every morning before breakfast.     ??? metoprolol succinate (TOPROL-XL) 100 MG 24 hr tablet Take 100 mg by mouth daily.     ??? multiple vitamin with vit K, adult, (MULTIPLE VITAMIN) 3,300 unit- 150 mcg/10 mL injection Infuse into a venous catheter.     ??? multivitamin capsule Take 1 capsule by mouth daily.     ??? pravastatin (PRAVACHOL) 20 MG tablet Take 20 mg by mouth daily.     ??? pyridoxine, vitamin B6, (B-6) 100 MG tablet Take 200 mg by mouth daily.     ??? sennosides (SENOKOT EXTRA STRENGTH) 17.2 mg Tab Continuous.     ??? zinc sulfate 220 mg Tab tablet Take 1 tablet (220 mg total) by mouth daily. 30 tablet 0   ??? cloZAPine (CLOZARIL) 50 MG tablet Take 3 tablets (150 mg total) by mouth daily. 90 tablet 0   ??? divalproex (DEPAKOTE) 500 MG DR tablet Take 2 tablets (1,000 mg total) by mouth nightly. (Patient taking differently: Take 750 mg by mouth nightly. ) 60 tablet 0   ??? metoprolol succinate (TOPROL-XL) 100 MG 24 hr tablet Take 1 tablet (100 mg total) by mouth daily. 30 tablet 0   ??? pravastatin (PRAVACHOL) 10 MG tablet Take 1 tablet (10 mg total) by mouth daily. (Patient taking differently: Take 20 mg by mouth daily. ) 30 tablet 0     No current facility-administered medications for this visit.     Vital Signs:  BSA: 2.45 meters squared  Vitals:    03/04/20 1233   BP: 135/90   Pulse: 116   Resp: 20   Temp: 36.9 ??C (98.4 ??F)   SpO2: 93%     Physical Exam:  Constitutional: Obese woman sitting in wheelchair, no acute distress  Eyes: PERRL. No scleral icterus or conjunctival injection.  Cardiovascular:  RRR.  S1, S2.  No murmurs, gallops or rubs.  No edema.  Respiratory:  Breathing is unlabored.  CTAB. No rales, ronchi or crackles.    GI:  No distention or pain on palpation.  Bowel sounds are present.  No palpable masses.  Musculoskeletal:   Unable to evaluate  Skin:  No rashes, petechiae or purpura.  grossly intact.   Neurologic:  alert and oriented to person.   Psychiatric: tangential requiring frequent re-direction    Relevant Laboratory, radiology and pathology results:  Appointment on 03/04/2020   Component Date Value Ref Range Status   ??? Collection 03/04/2020 Collected   Final

## 2020-03-04 NOTE — Unmapped (Signed)
1249 CBC with differential collected and sent for analysis.

## 2020-03-04 NOTE — Unmapped (Addendum)
Follow up in 3 months     Because of your immune deficiency, as it relates to your blood cancer and its treatment, I recommend that you receive a third injection of an mRNA-based COVID-19 vaccine. This recommendation is based on guidance from the FDA and CDC for patients with impaired immunity. Please provide this documentation if needed to any facility that requests proof that you are a patient with suppressed immunity. After the 3rd dose you should receive a 4th dose 3 months later    Please follow up with your primary doctor for knee pain.     We will call you with your lab results.     Continue your Gleevec.     If you have questions or concerns at night or on the weekend, call the hospital operator at [phone] and ask to speak to the hematology/oncology fellow on call.  ??  If you have questions or concerns on a week day during business hours, you may call the Nurse call line at [phone].    Appointment on 03/04/2020   Component Date Value Ref Range Status   ??? Collection 03/04/2020 Collected   Final

## 2020-03-04 NOTE — Unmapped (Unsigned)
Labs drawn and sent for analysis. Care provided by L.Chestnutt RN

## 2020-03-08 MED FILL — IMATINIB 400 MG TABLET: ORAL | 30 days supply | Qty: 30 | Fill #5

## 2020-03-24 NOTE — Unmapped (Signed)
Kimberly Mathis states pt has 34 days left.

## 2020-04-14 NOTE — Unmapped (Signed)
24 days left per Shellee Milo

## 2020-04-14 NOTE — Unmapped (Signed)
[facility] Refill Coordination Note    Specialty Medication(s) to be Shipped:   Hematology/Oncology: Imatinib    Other medication(s) to be shipped: No additional medications requested for fill at this time     Kaelyn Innocent, DOB: 1965-06-09  Phone: [phone] (home)       All above HIPAA information was verified with patient's caregiver, Lyla Son     Was a translator used for this call? No    Completed refill call assessment today to schedule patient's medication shipment from the [facility] [phone]).  All relevant notes have been reviewed.     Specialty medication(s) and dose(s) confirmed: Regimen is correct and unchanged.   Changes to medications: Niang reports no changes at this time.  Changes to insurance: No  New side effects reported not previously addressed with a pharmacist or physician: None reported  Questions for the pharmacist: No    Confirmed patient received a Conservation officer, historic buildings and a Surveyor, mining with first shipment. The patient will receive a drug information handout for each medication shipped and additional FDA Medication Guides as required.       DISEASE/MEDICATION-SPECIFIC INFORMATION        N/A    SPECIALTY MEDICATION ADHERENCE     Medication Adherence    Patient reported X missed doses in the last month: >5  Specialty Medication: Imatinib 400mg   Patient is on additional specialty medications: No  Informant: caregiver  Support network for adherence: home health agency              Were doses missed due to medication being on hold? No    Imatinib 400 mg: 24 days of medicine on hand       REFERRAL TO PHARMACIST     Referral to the pharmacist: Yes - Missed several doses      SHIPPING     Shipping address confirmed in Epic.     Delivery Scheduled: Patient declined refill at this time due to Having 24 days left of medication.     Medication will be delivered via Next Day Courier to the prescription address in Epic [place].    [name] [facility] Specialty Technician

## 2020-04-14 NOTE — Unmapped (Signed)
[facility] Pharmacist Intervention    Type of intervention: Adherence concern    Medication: imatinib 400 mg tablet once daily    Problem: Per Shellee Milo at assisted living facility, Ms. Schwebke is refusing imatinib dose several times weekly.     Intervention: [facility] called 03/24/20 and patient had 34 tablets on hand; called again 04/14/20 and patient has 24 tablets on hand.  Only took 10 tablets within the last 21 days.    Follow up needed: Forwarded msg to clinic    Approximate time spent: 5 minutes    Bryona Foxworthy A [facility] Specialty Pharmacist

## 2020-04-19 NOTE — Unmapped (Signed)
Just waiting on a call back from Ms. Synetta Fail she is the scheduler at the facility.

## 2020-04-21 NOTE — Unmapped (Signed)
Spoke with the care facility about scheduling a follow up for 5/12

## 2020-04-23 NOTE — Unmapped (Signed)
Completed.

## 2020-04-28 NOTE — Unmapped (Signed)
[facility] Refill Coordination Note    Specialty Medication(s) to be Shipped:   Hematology/Oncology: Imatinib    Other medication(s) to be shipped: No additional medications requested for fill at this time     Kimberly Mathis, DOB: [birth year]  Phone: [phone] (home)       All above HIPAA information was verified with patient's caregiver, Marylene Land     Was a translator used for this call? No    Completed refill call assessment today to schedule patient's medication shipment from the [facility] [phone]).  All relevant notes have been reviewed.     Specialty medication(s) and dose(s) confirmed: Regimen is correct and unchanged.   Changes to medications: Kimberly Mathis reports no changes at this time.  Changes to insurance: No  New side effects reported not previously addressed with a pharmacist or physician: None reported  Questions for the pharmacist: No    Confirmed patient received a Conservation officer, historic buildings and a Surveyor, mining with first shipment. The patient will receive a drug information handout for each medication shipped and additional FDA Medication Guides as required.       DISEASE/MEDICATION-SPECIFIC INFORMATION        N/A    SPECIALTY MEDICATION ADHERENCE     Medication Adherence    Patient reported X missed doses in the last month: 0  Specialty Medication: Imatinib 400 mg  Patient is on additional specialty medications: No  Informant: caregiver  Support network for adherence: home health agency              Were doses missed due to medication being on hold? No    Imatinib 400 mg: 10 days of medicine on hand       REFERRAL TO PHARMACIST     Referral to the pharmacist: Not needed      [facility]     Shipping address confirmed in Epic.     Delivery Scheduled: Yes, Expected medication delivery date: 05/05/20.     Medication will be delivered via Next Day Courier to the prescription address in Epic [place].    [name]   [facility] Specialty Technician

## 2020-05-04 MED FILL — IMATINIB 400 MG TABLET: ORAL | 30 days supply | Qty: 30 | Fill #6

## 2020-05-10 DIAGNOSIS — C9210 Chronic myeloid leukemia, BCR/ABL-positive, not having achieved remission: Principal | ICD-10-CM

## 2020-05-13 ENCOUNTER — Ambulatory Visit: Admit: 2020-05-13 | Payer: MEDICARE

## 2020-05-13 NOTE — Unmapped (Incomplete)
Kimberly Mathis is a 55 y.o. female with CML who I am seeing in clinic today for oral chemotherapy monitoring.    Encounter Date: 05/13/2020    Current Treatment: Imatinib 400 mg daily    For oral chemotherapy:  Pharmacy: {Blank single:19197:: [facility],***}   Medication Access: ***    Interval History:     Oncologic History:  Oncology History Overview Note   Diagnosis: CML    SOKAL Score:    Presentation: asymptomatic; leukocytosis found on routine CBC    Presenting WBC Count: 17,400    Bone Marrow Biopsy: not performed as patient unable to tolerate procedure.    Cytogenetics:  Abnormal FISH: An interphase FISH assay shows an abnormal signal pattern consistent with BCR/ABL1 fusion in 44% of the 100 cells scored. Of note, these cells contain an atypical abnormal signal pattern consistent with BCR/ABL1 rearrangement and loss of the ABL1/ASS1 region.    Treatment:  Imatinib 400 mg daily- 12/31/2015    12/31/15:  BCR-ABL1 p210 transcripts were detected at a level of 9.511 IS% ratio in blood.  BCR-ABL1 p190 transcripts were detected at a level of 3 in 100,000 cells in blood.    03/23/16:  BCR-ABL1 p210 transcripts were detected at a level of 0.702 IS % ratio in blood.    Normal FISH:  A BCR/ABL1 interphase FISH assay for the previously documented 9;22 translocation shows a normal signal pattern in 100% of the 200 nuclei scored     06/15/16:  BCR-ABL1 p210 transcripts were detected at a level of 0.272 IS % ratio in blood.    09/2016:  0.298 IS %    12/18: 0.241 IS%    03/22/17: 0.072%    06/21/17: 0.052%    09/27/17: 0.125%    01/03/18: 0.040%    05/28/18: 0.008%    09/13/18: 0.003%    01/14/2019: 0.181%  02/05/2019: 0.149%  04/10/2019: 0.037%  09/03/19: 0.040%  12/04/19: 0.004%       CML (chronic myelocytic leukemia) (CMS-HCC)   12/28/2015 Initial Diagnosis    CML (chronic myelocytic leukemia) (RAF-HCC)         Weight and Vitals:  Wt Readings from Last 3 Encounters:   12/23/19 (!) 126.4 kg (278 lb 10.6 oz)   09/03/19 (!) 128.4 kg (283 lb)   04/10/19 (!) 128.8 kg (283 lb 14.4 oz)     Temp Readings from Last 3 Encounters:   03/04/20 36.9 ??C (98.4 ??F) (Oral)   12/23/19 36.8 ??C (98.2 ??F) (Oral)   12/04/19 35.6 ??C (96 ??F) (Tympanic)     BP Readings from Last 3 Encounters:   03/04/20 135/90   12/23/19 144/78   12/04/19 120/69     Pulse Readings from Last 3 Encounters:   03/04/20 116   12/23/19 84   12/04/19 102       Pertinent Labs:  No visits with results within 1 Day(s) from this visit.   Latest known visit with results is:   Lab on 03/04/2020   Component Date Value Ref Range Status   ??? Collection 03/04/2020 Collected   Final   ??? Case Report 03/04/2020    Final                    Value:Molecular Genetics Report                         Case: [id number]  Authorizing Provider:  Avie Arenas,   Collected:           03/04/2020 1159                                     MD                                                                           Ordering Location:     [facility] ADULT ONCOLOGY LAB    Received:            03/04/2020 1209                                     [address] [place]                                                     Pathologist:           Diamantina Monks, MD                                                     Specimen:    Blood                                                                                     ??? Specimen Type 03/04/2020    Final                    Value:Blood   ??? BCR/ABL1 p210 Assay 03/04/2020 Positive   Final   ??? BCR/ABL1 p210 IS% Ratio 03/04/2020 0.032   Final   ??? BCR/ABL1 p210 Assay Results 03/04/2020    Final                    Value:This result contains rich text formatting which cannot be displayed here.       Allergies: No Known Allergies    Drug Interactions: ***      Current Medications:  Current Outpatient Medications   Medication Sig Dispense Refill   ??? acetaminophen (TYLENOL) 325 MG tablet acetaminophen 325 mg tablet   TAKE 3 TABLETS BY MOUTH TWICE DAILY.     ??? allopurinoL (ZYLOPRIM) 100 MG tablet Take 100 mg by mouth daily.     ??? clozapine (CLOZARIL) 100 MG tablet Take 100 mg by mouth daily.     ??? cloZAPine (CLOZARIL) 50 MG tablet Take 3 tablets (150  mg total) by mouth daily. 90 tablet 0   ??? cloZAPine (CLOZARIL) 50 MG tablet Take 50 mg by mouth daily.     ??? diclofenac sodium (VOLTAREN) 1 % gel      ??? divalproex (DEPAKOTE) 250 MG DR tablet Take 250 mg by mouth Three (3) times a day.     ??? divalproex (DEPAKOTE) 500 MG DR tablet Take 2 tablets (1,000 mg total) by mouth nightly. (Patient taking differently: Take 750 mg by mouth nightly. ) 60 tablet 0   ??? docusate sodium (COLACE) 100 MG capsule Take 100 mg by mouth Two (2) times a day.     ??? famotidine (PEPCID) 40 MG tablet      ??? folic acid (FOLVITE) 1 MG tablet Take 1 mg by mouth daily.     ??? glycopyrrolate (ROBINUL) 1 mg tablet Take 1 mg by mouth Two (2) times a day.     ??? imatinib (GLEEVEC) 400 MG tablet Take 1 tablet (400 mg total) by mouth daily. 30 tablet 11   ??? lisinopriL (PRINIVIL,ZESTRIL) 2.5 MG tablet Take 2.5 mg by mouth daily.     ??? loperamide (IMODIUM) 2 mg capsule loperamide 2 mg capsule   Take 1 capsule 3 times a day by oral route as needed for 30 days.     ??? metFORMIN (GLUMETZA) 1000 MG (MOD) 24 hr tablet Take 1,000 mg by mouth every morning before breakfast.     ??? metoprolol succinate (TOPROL-XL) 100 MG 24 hr tablet Take 1 tablet (100 mg total) by mouth daily. 30 tablet 0   ??? metoprolol succinate (TOPROL-XL) 100 MG 24 hr tablet Take 100 mg by mouth daily.     ??? multiple vitamin with vit K, adult, (MULTIPLE VITAMIN) 3,300 unit- 150 mcg/10 mL injection Infuse into a venous catheter.     ??? multivitamin capsule Take 1 capsule by mouth daily.     ??? pravastatin (PRAVACHOL) 10 MG tablet Take 1 tablet (10 mg total) by mouth daily. (Patient taking differently: Take 20 mg by mouth daily. ) 30 tablet 0   ??? pravastatin (PRAVACHOL) 20 MG tablet Take 20 mg by mouth daily.     ??? pyridoxine, vitamin B6, (B-6) 100 MG tablet Take 200 mg by mouth daily.     ??? sennosides (SENOKOT EXTRA STRENGTH) 17.2 mg Tab Continuous.     ??? zinc sulfate 220 mg Tab tablet Take 1 tablet (220 mg total) by mouth daily. 30 tablet 0     No current facility-administered medications for this visit.       Adherence:         Assessment: Kimberly Mathis is a 55 y.o. female with {Blank single:19197::AML,Ph+ B-ALL,Ph- B-ALL,T-ALL,CML,CLL,Aplastic Anemia,MDS,***} being treated currently with ***    Plan: ***    F/u:  Future Appointments   Date Time Provider Department Center   05/13/2020 10:30 AM ADULT ONC LAB [facility]   05/13/2020 11:30 AM Virgil Benedict, AGNP [id number]   05/13/2020 12:00 PM Kaitlyn Nadara Eaton, CPP [id number]   06/10/2020 12:00 PM ADULT ONC LAB [facility]   06/10/2020  1:00 PM Angela Dawn Nichols, AGNP [id number]       I spent *** minutes with Kimberly Mathis in direct patient care.    Ronnald Collum, PharmD, BCOP, CPP  Hematology/Oncology Clinical Pharmacist  Pager [phone]

## 2020-05-28 NOTE — Unmapped (Signed)
Kimberly Mathis states pt has 23 days left of medication.

## 2020-06-10 ENCOUNTER — Ambulatory Visit: Admit: 2020-06-10 | Payer: MEDICARE | Attending: Pharmacist | Primary: Pharmacist

## 2020-06-10 ENCOUNTER — Ambulatory Visit: Admit: 2020-06-10 | Payer: MEDICARE

## 2020-06-10 NOTE — Unmapped (Unsigned)
[facility] Follow-up    Patient Name: Kimberly Mathis  Patient Age: 55 y.o.  Encounter Date: 03/04/2020    Primary Care Provider:  Marcy Siren, PA    Referring Physician:  Virgil Benedict, AGNP  [address]  [id number]  [place],  [place] [address]    Reason for visit:   F/u visit for [facility]    Assessment:  Kimberly Mathis is a 55 y.o. 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 She began treatment on imatinib in 12/2015 with optimal response and meeting appropriate treatment milestones, except her one year PCR, which was slightly above goal.  She has been in a MMR and MR4.0 since 05/2018 until Jan 2021 when she lost MMR d/t caregiver change and missing medication.  Since that time she was able to achieve MMR again with most recent BCR/ABL PCR 0.032 from 03/2020.  She presents for a follow up appointment.      [name] presents today again without ***a caregiver and cannot provide thorough history. She is afebrile and non toxic appearing. No complaints of profound or limiting fatigue. CBC is pending. BCR/ABL PCR pending today. Per records she has been continued on Gleevec. She is tolerating this well without complications or toxicity. She currently resides in a long term care facility that provides her with her medications on a daily basis. She reports that she is taking the medications that her group home provides. She is vaccinated for COVID x2 and recommended she receive the 3rd dose as soon as possible and then a 4th dose in 3 months after the 3rd. As long as she continues to be in MMR we will plan to see her again in 3 months.     Knee pain: having difficulty walking d/t pain, only taking Tylenol. Doing physical therapy at the group home. Recommend PCP follow up    COVID vaccination: Has received two doses of Moderna. Again, reinforced our recommendation that she receive a 3rd and then a 4th dose of an mRNA vaccine.    Plan and Recommendations:  - continue Imatinib 400 mg daily ***  - please ensure pt receives COVID vaccine x 4 doses ***   - 3rd dose as soon as possible   - 4th dose three months after the 3rd ddose   - recommend PCP evaluation for knee pain ***  - RTC in 3 months with BCR/ABL PCR lab check***    Documentation assistance was provided by Aundria Mems, Scribe, on June 10, 2020 at 11:18 PM for Arna Medici, AGNP-BC    {*** NOTE TO PROVIDER: PLEASE ADD ATTESTATION NOTING YOU AGREE WITH SCRIBE DOCUMENTATION}    History of Present Illness:  Oncology History Overview Note   Diagnosis: CML    SOKAL Score:    Presentation: asymptomatic; leukocytosis found on routine CBC    Presenting WBC Count: 17,400    Bone Marrow Biopsy: not performed as patient unable to tolerate procedure.    Cytogenetics:  Abnormal FISH: An interphase FISH assay shows an abnormal signal pattern consistent with BCR/ABL1 fusion in 44% of the 100 cells scored. Of note, these cells contain an atypical abnormal signal pattern consistent with BCR/ABL1 rearrangement and loss of the ABL1/ASS1 region.    Treatment:  Imatinib 400 mg daily- 12/31/2015    12/31/15:  BCR-ABL1 p210 transcripts were detected at a level of 9.511 IS% ratio in blood.  BCR-ABL1 p190 transcripts were detected  at a level of 3 in 100,000 cells in blood.    03/23/16:  BCR-ABL1 p210 transcripts were detected at a level of 0.702 IS % ratio in blood.    Normal FISH:  A BCR/ABL1 interphase FISH assay for the previously documented 9;22 translocation shows a normal signal pattern in 100% of the 200 nuclei scored     06/15/16:  BCR-ABL1 p210 transcripts were detected at a level of 0.272 IS % ratio in blood.    09/2016:  0.298 IS %    12/18: 0.241 IS%    03/22/17: 0.072%    06/21/17: 0.052%    09/27/17: 0.125%    01/03/18: 0.040%    05/28/18: 0.008%    09/13/18: 0.003%    01/14/2019: 0.181%  02/05/2019: 0.149%  04/10/2019: 0.037%       CML (chronic myelocytic leukemia) (CMS-HCC)   12/28/2015 Initial Diagnosis    CML (chronic myelocytic leukemia) (RAF-HCC)       Interval History:  Since last visit on 03/04/20, Ms. Delaney ***.    Past Medical, Surgical and Family History were reviewed and pertinent updates were made in the Electronic Medical Record    Review of Systems:  All other systems reviewed were negative.     ECOG Performance Status: 3    Past Medical History:  Past Medical History:   Diagnosis Date   ??? GERD (gastroesophageal reflux disease)    ??? Gout    ??? HTN (hypertension)    ??? Morbid obesity (CMS-HCC)    ??? Osteoporosis    ??? Schizoaffective disorder, bipolar type (CMS-HCC)    ??? Type II diabetes mellitus (CMS-HCC)        Medications:    Current Outpatient Medications   Medication Sig Dispense Refill   ??? acetaminophen (TYLENOL) 325 MG tablet acetaminophen 325 mg tablet   TAKE 3 TABLETS BY MOUTH TWICE DAILY.     ??? allopurinoL (ZYLOPRIM) 100 MG tablet Take 100 mg by mouth daily.     ??? clozapine (CLOZARIL) 100 MG tablet Take 100 mg by mouth daily.     ??? cloZAPine (CLOZARIL) 50 MG tablet Take 50 mg by mouth daily.     ??? diclofenac sodium (VOLTAREN) 1 % gel      ??? divalproex (DEPAKOTE) 250 MG DR tablet Take 250 mg by mouth Three (3) times a day.     ??? docusate sodium (COLACE) 100 MG capsule Take 100 mg by mouth Two (2) times a day.     ??? famotidine (PEPCID) 40 MG tablet      ??? folic acid (FOLVITE) 1 MG tablet Take 1 mg by mouth daily.     ??? glycopyrrolate (ROBINUL) 1 mg tablet Take 1 mg by mouth Two (2) times a day.     ??? imatinib (GLEEVEC) 400 MG tablet Take 1 tablet (400 mg total) by mouth daily. 30 tablet 11   ??? lisinopriL (PRINIVIL,ZESTRIL) 2.5 MG tablet Take 2.5 mg by mouth daily.     ??? loperamide (IMODIUM) 2 mg capsule loperamide 2 mg capsule   Take 1 capsule 3 times a day by oral route as needed for 30 days.     ??? metFORMIN (GLUMETZA) 1000 MG (MOD) 24 hr tablet Take 1,000 mg by mouth every morning before breakfast.     ??? metoprolol succinate (TOPROL-XL) 100 MG 24 hr tablet Take 100 mg by mouth daily.     ??? multiple vitamin with vit K, adult, (MULTIPLE VITAMIN) 3,300 unit- 150 mcg/10 mL injection Infuse into  a venous catheter.     ??? multivitamin capsule Take 1 capsule by mouth daily.     ??? pravastatin (PRAVACHOL) 20 MG tablet Take 20 mg by mouth daily.     ??? pyridoxine, vitamin B6, (B-6) 100 MG tablet Take 200 mg by mouth daily.     ??? sennosides (SENOKOT EXTRA STRENGTH) 17.2 mg Tab Continuous.     ??? zinc sulfate 220 mg Tab tablet Take 1 tablet (220 mg total) by mouth daily. 30 tablet 0   ??? cloZAPine (CLOZARIL) 50 MG tablet Take 3 tablets (150 mg total) by mouth daily. 90 tablet 0   ??? divalproex (DEPAKOTE) 500 MG DR tablet Take 2 tablets (1,000 mg total) by mouth nightly. (Patient taking differently: Take 750 mg by mouth nightly. ) 60 tablet 0   ??? metoprolol succinate (TOPROL-XL) 100 MG 24 hr tablet Take 1 tablet (100 mg total) by mouth daily. 30 tablet 0   ??? pravastatin (PRAVACHOL) 10 MG tablet Take 1 tablet (10 mg total) by mouth daily. (Patient taking differently: Take 20 mg by mouth daily. ) 30 tablet 0     No current facility-administered medications for this visit.     Vital Signs:  BSA: 2.45 meters squared  Vitals:    03/04/20 1233   BP: 135/90   Pulse: 116   Resp: 20   Temp: 36.9 ??C (98.4 ??F)   SpO2: 93%     Physical Exam:  Constitutional: Obese woman sitting in wheelchair, no acute distress  Eyes: PERRL. No scleral icterus or conjunctival injection.  Cardiovascular:  RRR.  S1, S2.  No murmurs, gallops or rubs.  No edema.  Respiratory:  Breathing is unlabored.  CTAB. No rales, ronchi or crackles.    GI:  No distention or pain on palpation.  Bowel sounds are present.  No palpable masses.  Musculoskeletal:   Unable to evaluate  Skin:  No rashes, petechiae or purpura.  grossly intact.   Neurologic:  alert and oriented to person.   Psychiatric: tangential requiring frequent re-direction    Relevant Laboratory, radiology and pathology results:  Appointment on 03/04/2020   Component Date Value Ref Range Status   ??? Collection 03/04/2020 Collected   Final

## 2020-06-14 NOTE — Unmapped (Signed)
[facility] Refill Coordination Note    Specialty Medication(s) to be Shipped:   Hematology/Oncology: Imatinib    Other medication(s) to be shipped: No additional medications requested for fill at this time     Kimberly Mathis, DOB: Feb 14, 1965  Phone: There are no phone numbers on file.      All above HIPAA information was verified with patient's caregiver, Kimberly Mathis     Was a translator used for this call? No    Completed refill call assessment today to schedule patient's medication shipment from the [facility] [phone]).  All relevant notes have been reviewed.     Specialty medication(s) and dose(s) confirmed: Regimen is correct and unchanged.   Changes to medications: Kimberly Mathis reports no changes at this time.  Changes to insurance: No  New side effects reported not previously addressed with a pharmacist or physician: None reported  Questions for the pharmacist: No    Confirmed patient received a Conservation officer, historic buildings and a Surveyor, mining with first shipment. The patient will receive a drug information handout for each medication shipped and additional FDA Medication Guides as required.       DISEASE/MEDICATION-SPECIFIC INFORMATION        N/A    SPECIALTY MEDICATION ADHERENCE     Medication Adherence    Patient reported X missed doses in the last month: 3  Specialty Medication: imatinib 400mg   Patient is on additional specialty medications: No  Informant: caregiver  Support network for adherence: home health agency              Were doses missed due to medication being on hold? No    imatinib 400 mg: 8 days of medicine on hand       REFERRAL TO PHARMACIST     Referral to the pharmacist: Not needed      [facility]     Shipping address confirmed in Epic.     Delivery Scheduled: Yes, Expected medication delivery date: 06/18/20.     Medication will be delivered via Next Day Courier to the prescription address in Epic [place].    Kimberly Mathis Kimberly Mathis   [facility] Specialty Technician

## 2020-06-16 DIAGNOSIS — C9210 Chronic myeloid leukemia, BCR/ABL-positive, not having achieved remission: Principal | ICD-10-CM

## 2020-06-17 ENCOUNTER — Ambulatory Visit: Admit: 2020-06-17 | Payer: MEDICARE

## 2020-06-17 ENCOUNTER — Ambulatory Visit: Admit: 2020-06-17 | Payer: MEDICARE | Attending: Hematology & Oncology | Primary: Hematology & Oncology

## 2020-06-17 MED FILL — IMATINIB 400 MG TABLET: ORAL | 30 days supply | Qty: 30 | Fill #7

## 2020-06-17 NOTE — Unmapped (Deleted)
[facility] Follow-up    Patient Name: Kimberly Mathis  Patient Age: 55 y.o.  Encounter Date: 06/17/2020    Primary Care Provider:  Marcy Siren, PA    Referring Physician:  Desiree Lucy, PA  [address]  [address]  [place],  [place] [address]    Reason for visit:   F/u visit for [facility]    Assessment:  Kimberly Mathis is a 55 y.o.  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 She began treatment on imatinib in 12/2015 with optimal response and meeting appropriate treatment milestones, except her one year PCR, which was slightly above goal.  She has been in a MMR and MR4.0 since 05/2018 until Jan 2021 when she lost MMR d/t caregiver change and missing medication.  Since that time she was able to achieve MMR again with most recent BCR/ABL PCR 0.032 from 03/2020.  She presents for a follow up appointment.      Kimberly Mathis presents today again without ***a caregiver and cannot provide thorough history. She is afebrile and non toxic appearing. No complaints of profound or limiting fatigue. CBC is pending. BCR/ABL PCR pending today. Per records she has been continued on Gleevec. She is tolerating this well without complications or toxicity. She currently resides in a long term care facility that provides her with her medications on a daily basis. She reports that she is taking the medications that her group home provides. She is vaccinated for COVID x2 and recommended she receive the 3rd dose as soon as possible and then a 4th dose in 3 months after the 3rd. As long as she continues to be in MMR we will plan to see her again in 3 months.     Knee pain: having difficulty walking d/t pain, only taking Tylenol. Doing physical therapy at the group home. Recommend PCP follow up    COVID vaccination: Has received two doses of Moderna. Again, reinforced our recommendation that she receive a 3rd and then a 4th dose of an mRNA vaccine.    Plan and Recommendations:  - continue Imatinib 400 mg daily ***  - please ensure pt receives COVID vaccine x 4 doses ***   - 3rd dose as soon as possible   - 4th dose three months after the 3rd ddose   - recommend PCP evaluation for knee pain ***  - RTC in 3 months with BCR/ABL PCR lab check***    Documentation assistance was provided by Aundria Mems, Scribe, on June 17, 2020 at 7:25 AM for Cindra Presume, MD    {*** NOTE TO PROVIDER: PLEASE ADD ATTESTATION NOTING YOU AGREE WITH SCRIBE DOCUMENTATION}      History of Present Illness:  Oncology History Overview Note   Diagnosis: CML    SOKAL Score:    Presentation: asymptomatic; leukocytosis found on routine CBC    Presenting WBC Count: 17,400    Bone Marrow Biopsy: not performed as patient unable to tolerate procedure.    Cytogenetics:  Abnormal FISH: An interphase FISH assay shows an abnormal signal pattern consistent with BCR/ABL1 fusion in 44% of the 100 cells scored. Of note, these cells contain an atypical abnormal signal pattern consistent with BCR/ABL1 rearrangement and loss of the ABL1/ASS1 region.    Treatment:  Imatinib 400 mg daily- 12/31/2015    12/31/15:  BCR-ABL1 p210 transcripts were detected at a level of 9.511 IS% ratio in blood.  BCR-ABL1 p190  transcripts were detected at a level of 3 in 100,000 cells in blood.    03/23/16:  BCR-ABL1 p210 transcripts were detected at a level of 0.702 IS % ratio in blood.    Normal FISH:  A BCR/ABL1 interphase FISH assay for the previously documented 9;22 translocation shows a normal signal pattern in 100% of the 200 nuclei scored     06/15/16:  BCR-ABL1 p210 transcripts were detected at a level of 0.272 IS % ratio in blood.    09/2016:  0.298 IS %    12/18: 0.241 IS%    03/22/17: 0.072%    06/21/17: 0.052%    09/27/17: 0.125%    01/03/18: 0.040%    05/28/18: 0.008%    09/13/18: 0.003%    01/14/2019: 0.181%  02/05/2019: 0.149%  04/10/2019: 0.037%  09/03/19: 0.040%  12/04/19: 0.004%  03/04/20: 0.032% CML (chronic myelocytic leukemia) (CMS-HCC)   12/28/2015 Initial Diagnosis    CML (chronic myelocytic leukemia) (RAF-HCC)       Interval History:  Kimberly Mathis comes to clinic unaccompanied. In the interim she was seen in the ED for nausea and vomiting tx'd with IVF and antiemetics. Did not require admission. She does not have a caregiver to provide accurate history. She states I take what they give me. She asks about return appointments and assured her that we will set up follow up appointments. She reports that she cannot stand due to pain in her knees. Taking Tylenol for pain. Does not see a pain specialist or primary doctor. She is in physical therapy. Uses supported exercise bike. Walks with a cane. No fevers, chills, nausea, vomiting, diarrhea, abdominal pain, back pain, cough, congestion, night sweats, weight loss, new LAD, chest pain, shortness of breath, headaches, vision changes, or other present medical complaints.     Past Medical, Surgical and Family History were reviewed and pertinent updates were made in the Electronic Medical Record    Review of Systems:  All other systems reviewed were negative.     ECOG Performance Status: 3    Past Medical History:  Past Medical History:   Diagnosis Date   ??? GERD (gastroesophageal reflux disease)    ??? Gout    ??? HTN (hypertension)    ??? Morbid obesity (CMS-HCC)    ??? Osteoporosis    ??? Schizoaffective disorder, bipolar type (CMS-HCC)    ??? Type II diabetes mellitus (CMS-HCC)        Medications:    Current Outpatient Medications   Medication Sig Dispense Refill   ??? acetaminophen (TYLENOL) 325 MG tablet acetaminophen 325 mg tablet   TAKE 3 TABLETS BY MOUTH TWICE DAILY.     ??? allopurinoL (ZYLOPRIM) 100 MG tablet Take 100 mg by mouth daily.     ??? clozapine (CLOZARIL) 100 MG tablet Take 100 mg by mouth daily.     ??? cloZAPine (CLOZARIL) 50 MG tablet Take 3 tablets (150 mg total) by mouth daily. 90 tablet 0   ??? cloZAPine (CLOZARIL) 50 MG tablet Take 50 mg by mouth daily. ??? diclofenac sodium (VOLTAREN) 1 % gel      ??? divalproex (DEPAKOTE) 250 MG DR tablet Take 250 mg by mouth Three (3) times a day.     ??? divalproex (DEPAKOTE) 500 MG DR tablet Take 2 tablets (1,000 mg total) by mouth nightly. (Patient taking differently: Take 750 mg by mouth nightly. ) 60 tablet 0   ??? docusate sodium (COLACE) 100 MG capsule Take 100 mg by mouth Two (2) times a day.     ???  famotidine (PEPCID) 40 MG tablet      ??? folic acid (FOLVITE) 1 MG tablet Take 1 mg by mouth daily.     ??? glycopyrrolate (ROBINUL) 1 mg tablet Take 1 mg by mouth Two (2) times a day.     ??? imatinib (GLEEVEC) tablet Take 1 tablet (400 mg total) by mouth daily. 30 tablet 11   ??? lisinopriL (PRINIVIL,ZESTRIL) 2.5 MG tablet Take 2.5 mg by mouth daily.     ??? loperamide (IMODIUM) 2 mg capsule loperamide 2 mg capsule   Take 1 capsule 3 times a day by oral route as needed for 30 days.     ??? metFORMIN (GLUMETZA) 1000 MG (MOD) 24 hr tablet Take 1,000 mg by mouth every morning before breakfast.     ??? metoprolol succinate (TOPROL-XL) 100 MG 24 hr tablet Take 1 tablet (100 mg total) by mouth daily. 30 tablet 0   ??? metoprolol succinate (TOPROL-XL) 100 MG 24 hr tablet Take 100 mg by mouth daily.     ??? multiple vitamin with vit K, adult, (MULTIPLE VITAMIN) 3,300 unit- 150 mcg/10 mL injection Infuse into a venous catheter.     ??? multivitamin capsule Take 1 capsule by mouth daily.     ??? pravastatin (PRAVACHOL) 10 MG tablet Take 1 tablet (10 mg total) by mouth daily. (Patient taking differently: Take 20 mg by mouth daily. ) 30 tablet 0   ??? pravastatin (PRAVACHOL) 20 MG tablet Take 20 mg by mouth daily.     ??? pyridoxine, vitamin B6, (B-6) 100 MG tablet Take 200 mg by mouth daily.     ??? sennosides (SENOKOT EXTRA STRENGTH) 17.2 mg Tab Continuous.     ??? zinc sulfate 220 mg Tab tablet Take 1 tablet (220 mg total) by mouth daily. 30 tablet 0     No current facility-administered medications for this visit.     Vital Signs:  BSA: There is no height or weight on file to calculate BSA.  There were no vitals filed for this visit.  Physical Exam:  Constitutional: Obese woman sitting in wheelchair, no acute distress  Eyes: PERRL. No scleral icterus or conjunctival injection.  Cardiovascular:  RRR.  S1, S2.  No murmurs, gallops or rubs.  No edema.  Respiratory:  Breathing is unlabored.  CTAB. No rales, ronchi or crackles.    GI:  No distention or pain on palpation.  Bowel sounds are present.  No palpable masses.  Musculoskeletal:   Unable to evaluate  Skin:  No rashes, petechiae or purpura.  grossly intact.   Neurologic:  alert and oriented to person.   Psychiatric: tangential requiring frequent re-direction    Relevant Laboratory, radiology and pathology results:  No visits with results within 1 Day(s) from this visit.   Latest known visit with results is:   Lab on 03/04/2020   Component Date Value Ref Range Status   ??? Collection 03/04/2020 Collected   Final   ??? Case Report 03/04/2020    Final                    Value:Molecular Genetics Report                         Case: [id number]                                 Authorizing Provider:  Windell Norfolk Zeidner,  Collected:           03/04/2020 1159                                     MD                                                                           Ordering Location:     [facility] ADULT ONCOLOGY LAB    Received:            03/04/2020 1209                                     [address] [place]                                                     Pathologist:           Diamantina Monks, MD                                                     Specimen:    Blood                                                                                     ??? Specimen Type 03/04/2020    Final                    Value:Blood   ??? BCR/ABL1 p210 Assay 03/04/2020 Positive   Final   ??? BCR/ABL1 p210 IS% Ratio 03/04/2020 0.032   Final   ??? BCR/ABL1 p210 Assay Results 03/04/2020    Final                    Value:This result contains rich text formatting which cannot be displayed here.

## 2020-06-21 NOTE — Unmapped (Signed)
I called Kimberly Mathis' care team to ask about her missed appointment.  Unfortunately the facility did not have the appointment on her schedule.  They will call for a make-up appointment.

## 2020-06-25 NOTE — Unmapped (Signed)
Completed. Added for 6/29

## 2020-06-25 NOTE — Unmapped (Signed)
Spoke with Ms. Synetta Fail about getting Ms. Plate scheduled for a follow up for 6/29.

## 2020-06-30 ENCOUNTER — Ambulatory Visit: Admit: 2020-06-30 | Discharge: 2020-07-01 | Payer: MEDICARE

## 2020-06-30 DIAGNOSIS — C9210 Chronic myeloid leukemia, BCR/ABL-positive, not having achieved remission: Principal | ICD-10-CM

## 2020-06-30 LAB — CBC W/ AUTO DIFF
BASOPHILS ABSOLUTE COUNT: 0.1 10*9/L (ref 0.0–0.1)
BASOPHILS RELATIVE PERCENT: 0.9 %
EOSINOPHILS ABSOLUTE COUNT: 0.2 10*9/L (ref 0.0–0.5)
EOSINOPHILS RELATIVE PERCENT: 1.9 %
HEMATOCRIT: 32.6 % — ABNORMAL LOW (ref 34.0–44.0)
HEMOGLOBIN: 10.7 g/dL — ABNORMAL LOW (ref 11.3–14.9)
LYMPHOCYTES ABSOLUTE COUNT: 3.1 10*9/L (ref 1.1–3.6)
LYMPHOCYTES RELATIVE PERCENT: 28.9 %
MEAN CORPUSCULAR HEMOGLOBIN CONC: 32.8 g/dL (ref 32.0–36.0)
MEAN CORPUSCULAR HEMOGLOBIN: 28.8 pg (ref 25.9–32.4)
MEAN CORPUSCULAR VOLUME: 87.8 fL (ref 77.6–95.7)
MEAN PLATELET VOLUME: 7.6 fL (ref 6.8–10.7)
MONOCYTES ABSOLUTE COUNT: 0.7 10*9/L (ref 0.3–0.8)
MONOCYTES RELATIVE PERCENT: 7.1 %
NEUTROPHILS ABSOLUTE COUNT: 6.5 10*9/L (ref 1.8–7.8)
NEUTROPHILS RELATIVE PERCENT: 61.2 %
PLATELET COUNT: 289 10*9/L (ref 150–450)
RED BLOOD CELL COUNT: 3.71 10*12/L — ABNORMAL LOW (ref 3.95–5.13)
RED CELL DISTRIBUTION WIDTH: 16.9 % — ABNORMAL HIGH (ref 12.2–15.2)
WBC ADJUSTED: 10.6 10*9/L (ref 3.6–11.2)

## 2020-06-30 LAB — COMPREHENSIVE METABOLIC PANEL
ALBUMIN: 4.1 g/dL (ref 3.4–5.0)
ALKALINE PHOSPHATASE: 70 U/L (ref 46–116)
ALT (SGPT): 12 U/L (ref 10–49)
ANION GAP: 8 mmol/L (ref 5–14)
AST (SGOT): 17 U/L (ref <=34)
BILIRUBIN TOTAL: 0.2 mg/dL — ABNORMAL LOW (ref 0.3–1.2)
BLOOD UREA NITROGEN: <5 mg/dL — ABNORMAL LOW (ref 9–23)
CALCIUM: 9.3 mg/dL (ref 8.7–10.4)
CHLORIDE: 100 mmol/L (ref 98–107)
CO2: 30 mmol/L (ref 20.0–31.0)
CREATININE: 0.48 mg/dL — ABNORMAL LOW
EGFR CKD-EPI (2021) FEMALE: >90 mL/min/{1.73_m2} (ref >=60)
GLUCOSE RANDOM: 136 mg/dL (ref 70–179)
POTASSIUM: 3.7 mmol/L (ref 3.4–4.8)
PROTEIN TOTAL: 7.9 g/dL (ref 5.7–8.2)
SODIUM: 138 mmol/L (ref 135–145)

## 2020-06-30 NOTE — Unmapped (Signed)
[facility]ukemia Clinic Follow-up    Patient Name: Kimberly Mathis  Patient Age: 55 y.o.  Encounter Date: 06/30/2020    Primary Care Provider:  Marcy Siren, PA    Referring Physician:  Avie Arenas, MD  [address]  [id number]  [place],  [place] [address]    Reason for visit:   F/u visit for [facility]    Assessment:  Kimberly Mathis is a 55 y.o. 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 She began treatment on imatinib in 12/2015 with optimal response and meeting appropriate treatment milestones, except her one year PCR, which was slightly above goal.  She has been in a MMR and MR4.0 since 05/2018 until Jan 2021 when she lost MMR d/t caregiver change and missing medication.  Since that time she was able to achieve MMR again with most recent BCR/ABL PCR 0.004 from 12/2019.  She presents for a follow up appointment.      Kimberly Mathis presents today again without a caregiver and cannot provide thorough history. She is afebrile and non toxic appearing. No complaints of profound or limiting fatigue. CBC is pending. BCR/ABL PCR pending today. Per records she has been continued on Gleevec. She is tolerating this well without complications or toxicity. She currently resides in a long term care facility that provides her with her medications on a daily basis. She reports that she is taking the medications that her group home provides. She is vaccinated for COVID x2 and recommended she receive the 3rd dose as soon as possible and then a 4th dose in 3 months after the 3rd. As long as she continues to be in MMR we will plan to see her again in 3 months.     Knee pain: having difficulty walking d/t pain, only taking Tylenol. Doing physical therapy at the group home. Recommend PCP follow up    COVID vaccination: Has received two doses of Moderna. I discussed with the patient the recommendation to receive a 3rd dose of mRNA vaccine. This guidance from the CDC is based on data suggesting impaired immunity in patients with cancer on active treatment including other patients who are highly immunosuppressed. We discussed the benefits and risks of receiving a 3rd dose mRNA vaccine and that given the surge of the delta variant, a 3rd dose may provide more overall protection against infection. I advised patient to continue wearing masks outside of house, avoiding contact with others, particularly those unvaccinated. Then 3 months after the 3rd dose she will need a 4th dose of an mRNA vaccine.     Plan and Recommendations:  - continue Imatinib 400 mg daily  - please ensure pt receives COVID vaccine x 4 doses   - 3rd dose as soon as possible   - 4th dose three months after the 3rd ddose   - recommend PCP evaluation for knee pain  - RTC in 3 months with BCR/ABL PCR lab check    Documentation assistance provided by Medical Scribe, Elaina Hoops Reitzel, who was present during the entirety of the visit. I reviewed the note below and validated all of the information provided to ensure accuracy and completeness.     Cindra Presume, MD    History of Present Illness:  Oncology History Overview Note   Diagnosis: CML    SOKAL Score:    Presentation: asymptomatic; leukocytosis found on routine CBC    Presenting WBC Count: 17,400  Bone Marrow Biopsy: not performed as patient unable to tolerate procedure.    Cytogenetics:  Abnormal FISH: An interphase FISH assay shows an abnormal signal pattern consistent with BCR/ABL1 fusion in 44% of the 100 cells scored. Of note, these cells contain an atypical abnormal signal pattern consistent with BCR/ABL1 rearrangement and loss of the ABL1/ASS1 region.    Treatment:  Imatinib 400 mg daily- 12/31/2015    12/31/15:  BCR-ABL1 p210 transcripts were detected at a level of 9.511 IS% ratio in blood.  BCR-ABL1 p190 transcripts were detected at a level of 3 in 100,000 cells in blood.    03/23/16:  BCR-ABL1 p210 transcripts were detected at a level of 0.702 IS % ratio in blood.    Normal FISH:  A BCR/ABL1 interphase FISH assay for the previously documented 9;22 translocation shows a normal signal pattern in 100% of the 200 nuclei scored     06/15/16:  BCR-ABL1 p210 transcripts were detected at a level of 0.272 IS % ratio in blood.    09/2016:  0.298 IS %    12/18: 0.241 IS%    03/22/17: 0.072%    06/21/17: 0.052%    09/27/17: 0.125%    01/03/18: 0.040%    05/28/18: 0.008%    09/13/18: 0.003%    01/14/2019: 0.181%  02/05/2019: 0.149%  04/10/2019: 0.037%  09/03/19: 0.040%  12/04/19: 0.004%  03/04/20: 0.032%       CML (chronic myelocytic leukemia) (CMS-HCC)   12/28/2015 Initial Diagnosis    CML (chronic myelocytic leukemia) (RAF-HCC)       Interval History:  Kimberly Mathis comes to clinic unaccompanied. In the interim she was seen in the ED for nausea and vomiting tx'd with IVF and antiemetics. Did not require admission. She does not have a caregiver to provide accurate history. She states I take what they give me. She asks about return appointments and assured her that we will set up follow up appointments. She reports that she cannot stand due to pain in her knees. Taking Tylenol for pain. Does not see a pain specialist or primary doctor. She is in physical therapy. Uses supported exercise bike. Walks with a cane. No fevers, chills, nausea, vomiting, diarrhea, abdominal pain, back pain, cough, congestion, night sweats, weight loss, new LAD, chest pain, shortness of breath, headaches, vision changes, or other present medical complaints.     Past Medical, Surgical and Family History were reviewed and pertinent updates were made in the Electronic Medical Record    Review of Systems:  All other systems reviewed were negative.     ECOG Performance Status: 3    Past Medical History:  Past Medical History:   Diagnosis Date   ??? GERD (gastroesophageal reflux disease)    ??? Gout    ??? HTN (hypertension)    ??? Morbid obesity (CMS-HCC)    ??? Osteoporosis    ??? Schizoaffective disorder, bipolar type (CMS-HCC)    ??? Type II diabetes mellitus (CMS-HCC)        Medications:    Current Outpatient Medications   Medication Sig Dispense Refill   ??? acetaminophen (TYLENOL) 325 MG tablet acetaminophen 325 mg tablet   TAKE 3 TABLETS BY MOUTH TWICE DAILY.     ??? allopurinoL (ZYLOPRIM) 100 MG tablet Take 100 mg by mouth daily.     ??? clozapine (CLOZARIL) 100 MG tablet Take 100 mg by mouth daily.     ??? diclofenac sodium (VOLTAREN) 1 % gel      ??? docusate sodium (COLACE) 100  MG capsule Take 100 mg by mouth Two (2) times a day.     ??? famotidine (PEPCID) 40 MG tablet      ??? folic acid (FOLVITE) 1 MG tablet Take 1 mg by mouth daily.     ??? imatinib (GLEEVEC) tablet Take 1 tablet (400 mg total) by mouth daily. 30 tablet 11   ??? lisinopriL (PRINIVIL,ZESTRIL) 2.5 MG tablet Take 2.5 mg by mouth daily.     ??? loperamide (IMODIUM) 2 mg capsule loperamide 2 mg capsule   Take 1 capsule 3 times a day by oral route as needed for 30 days.     ??? metFORMIN (GLUMETZA) 1000 MG (MOD) 24 hr tablet Take 1,000 mg by mouth every morning before breakfast.     ??? metoprolol succinate (TOPROL-XL) 100 MG 24 hr tablet Take 100 mg by mouth daily.     ??? multiple vitamin with vit K, adult, (MULTIPLE VITAMIN) 3,300 unit- 150 mcg/10 mL injection Infuse into a venous catheter.     ??? multivitamin capsule Take 1 capsule by mouth daily.     ??? pravastatin (PRAVACHOL) 20 MG tablet Take 20 mg by mouth daily.     ??? pyridoxine, vitamin B6, (B-6) 100 MG tablet Take 200 mg by mouth daily.     ??? sennosides (SENOKOT EXTRA STRENGTH) 17.2 mg Tab Continuous.     ??? zinc sulfate 220 mg Tab tablet Take 1 tablet (220 mg total) by mouth daily. 30 tablet 0   ??? metoprolol succinate (TOPROL-XL) 100 MG 24 hr tablet Take 1 tablet (100 mg total) by mouth daily. 30 tablet 0   ??? pravastatin (PRAVACHOL) 10 MG tablet Take 1 tablet (10 mg total) by mouth daily. (Patient taking differently: Take 20 mg by mouth daily. ) 30 tablet 0     No current facility-administered medications for this visit.     Vital Signs:  BSA: There is no height or weight on file to calculate BSA.  Vitals:    06/30/20 1407   BP: 140/88   Pulse: 108   Resp: 20   Temp: 36.9 ??C (98.4 ??F)   SpO2: 92%     Physical Exam:  Constitutional: Obese woman sitting in wheelchair, no acute distress  Eyes: PERRL. No scleral icterus or conjunctival injection.  Cardiovascular:  RRR.  S1, S2.  No murmurs, gallops or rubs.  No edema.  Respiratory:  Breathing is unlabored.  CTAB. No rales, ronchi or crackles.    GI:  No distention or pain on palpation.  Bowel sounds are present.  No palpable masses.  Musculoskeletal:   Unable to evaluate  Skin:  No rashes, petechiae or purpura.  grossly intact.   Neurologic:  alert and oriented to person.   Psychiatric: tangential requiring frequent re-direction    Relevant Laboratory, radiology and pathology results:  Appointment on 06/30/2020   Component Date Value Ref Range Status   ??? WBC 06/30/2020 10.6  3.6 - 11.2 10*9/L Final   ??? RBC 06/30/2020 3.71 (A) 3.95 - 5.13 10*12/L Final   ??? HGB 06/30/2020 10.7 (A) 11.3 - 14.9 g/dL Final   ??? HCT 96/29/5284 32.6 (A) 34.0 - 44.0 % Final   ??? MCV 06/30/2020 87.8  77.6 - 95.7 fL Final   ??? MCH 06/30/2020 28.8  25.9 - 32.4 pg Final   ??? MCHC 06/30/2020 32.8  32.0 - 36.0 g/dL Final   ??? RDW 13/24/4010 16.9 (A) 12.2 - 15.2 % Final   ??? MPV 06/30/2020 7.6  6.8 -  10.7 fL Final   ??? Platelet 06/30/2020 289  150 - 450 10*9/L Final   ??? Neutrophils % 06/30/2020 61.2  % Final   ??? Lymphocytes % 06/30/2020 28.9  % Final   ??? Monocytes % 06/30/2020 7.1  % Final   ??? Eosinophils % 06/30/2020 1.9  % Final   ??? Basophils % 06/30/2020 0.9  % Final   ??? Absolute Neutrophils 06/30/2020 6.5  1.8 - 7.8 10*9/L Final   ??? Absolute Lymphocytes 06/30/2020 3.1  1.1 - 3.6 10*9/L Final   ??? Absolute Monocytes 06/30/2020 0.7  0.3 - 0.8 10*9/L Final   ??? Absolute Eosinophils 06/30/2020 0.2  0.0 - 0.5 10*9/L Final   ??? Absolute Basophils 06/30/2020 0.1  0.0 - 0.1 10*9/L Final   ??? Anisocytosis 06/30/2020 Slight (A) Not Present Final

## 2020-06-30 NOTE — Unmapped (Signed)
[facility] Leukemia Clinic Follow-up    Patient Name: Kimberly Mathis  Patient Age: 55 y.o.  Encounter Date: 06/30/2020    Primary Care Provider:  Marcy Siren, PA    Referring Physician:  Avie Arenas, MD  [address]  [id number] [facility]  [place],  [place] [address]    Reason for visit:   F/u visit for [facility]    Assessment:  Ms. Kimberly Mathis is a 55 y.o. 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 She began treatment on imatinib in 12/2015 with optimal response and meeting appropriate treatment milestones, except her one year PCR, which was slightly above goal.  She has been in a MMR and MR4.0 since 05/2018 until Jan 2021 when she lost MMR d/t caregiver change and missing medication.  Since that time she was able to achieve MMR again with most recent BCR/ABL PCR 0.032 in 03/2020.   She presents today for follow up.      Ms. Soucy presents again today without a caregiver and cannot provide thorough history.   She resides in long term care home and is dropped off for her clinic appointments.  She left the clinic in lab after she was stuck 2 times for labs, but eventually showed up to heme/onc clinic.  Labs were eventually drawn and currently pending.  She is feeling well and continues to c/o right knee pain.  She is tolerating imatinib without difficulty.  Will follow up with BCR/ABL PCR from today.  As long as BCR/ABL is not increasing and she remains in MMR we will continue to follow her every 3 months with PCR testing.  For now she should continue imatinib 400mg  daily.       Plan and Recommendations:  - continue Imatinib 400 mg daily  - RTC in 3 months with BCR/ABL PCR lab check      Dr. Vertell Limber was available    Arna Medici, AGNP-BC  Leukemia Research Nurse Practitioner  Hematology/Oncology Division  [facility]  06/30/2020     I personally spent 50 minutes face-to-face and non-face-to-face in the care of this patient, which includes all pre, intra, and post visit time on the date of service.    History of Present Illness:  Oncology History Overview Note   Diagnosis: CML    SOKAL Score:    Presentation: asymptomatic; leukocytosis found on routine CBC    Presenting WBC Count: 17,400    Bone Marrow Biopsy: not performed as patient unable to tolerate procedure.    Cytogenetics:  Abnormal FISH: An interphase FISH assay shows an abnormal signal pattern consistent with BCR/ABL1 fusion in 44% of the 100 cells scored. Of note, these cells contain an atypical abnormal signal pattern consistent with BCR/ABL1 rearrangement and loss of the ABL1/ASS1 region.    Treatment:  Imatinib 400 mg daily- 12/31/2015    12/31/15:  BCR-ABL1 p210 transcripts were detected at a level of 9.511 IS% ratio in blood.  BCR-ABL1 p190 transcripts were detected at a level of 3 in 100,000 cells in blood.    03/23/16:  BCR-ABL1 p210 transcripts were detected at a level of 0.702 IS % ratio in blood.    Normal FISH:  A BCR/ABL1 interphase FISH assay for the previously documented 9;22 translocation shows a normal signal pattern in 100% of the 200 nuclei scored     06/15/16:  BCR-ABL1 p210 transcripts were detected at a level of 0.272 IS % ratio  in blood.    09/2016:  0.298 IS %    12/18: 0.241 IS%    03/22/17: 0.072%    06/21/17: 0.052%    09/27/17: 0.125%    01/03/18: 0.040%    05/28/18: 0.008%    09/13/18: 0.003%    01/14/2019: 0.181%  02/05/2019: 0.149%  04/10/2019: 0.037%  09/03/19: 0.040%  12/04/19: 0.004%  03/04/20: 0.032%       CML (chronic myelocytic leukemia) (CMS-HCC)   12/28/2015 Initial Diagnosis    CML (chronic myelocytic leukemia) (RAF-HCC)       Interval History:  Ms. Kimberly Mathis comes to clinic unaccompanied.  She denies any recent n/v.  She does endorse occasional diarrhea.  She states she is able to tolerate her medications with crushing them.  Since her prolonged illness a year ago, when she was trached, she has a hard time swallowing pills.  She continues to have right knee pain, but this is not new for her.  She is walking with the assistance of a wheelchair.  She denies any recent fevers/chills or abdominal pain.  Again, she does not have a caregiver to provide accurate history.      Past Medical, Surgical and Family History were reviewed and pertinent updates were made in the Electronic Medical Record    Review of Systems:  All other systems reviewed were negative.     ECOG Performance Status: 3    Past Medical History:  Past Medical History:   Diagnosis Date   ??? GERD (gastroesophageal reflux disease)    ??? Gout    ??? HTN (hypertension)    ??? Morbid obesity (CMS-HCC)    ??? Osteoporosis    ??? Schizoaffective disorder, bipolar type (CMS-HCC)    ??? Type II diabetes mellitus (CMS-HCC)        Medications:    Current Outpatient Medications   Medication Sig Dispense Refill   ??? acetaminophen (TYLENOL) 325 MG tablet acetaminophen 325 mg tablet   TAKE 3 TABLETS BY MOUTH TWICE DAILY.     ??? allopurinoL (ZYLOPRIM) 100 MG tablet Take 100 mg by mouth daily.     ??? clozapine (CLOZARIL) 100 MG tablet Take 100 mg by mouth daily.     ??? diclofenac sodium (VOLTAREN) 1 % gel      ??? docusate sodium (COLACE) 100 MG capsule Take 100 mg by mouth Two (2) times a day.     ??? famotidine (PEPCID) 40 MG tablet      ??? folic acid (FOLVITE) 1 MG tablet Take 1 mg by mouth daily.     ??? imatinib (GLEEVEC) tablet Take 1 tablet (400 mg total) by mouth daily. 30 tablet 11   ??? lisinopriL (PRINIVIL,ZESTRIL) 2.5 MG tablet Take 2.5 mg by mouth daily.     ??? loperamide (IMODIUM) 2 mg capsule loperamide 2 mg capsule   Take 1 capsule 3 times a day by oral route as needed for 30 days.     ??? metFORMIN (GLUMETZA) 1000 MG (MOD) 24 hr tablet Take 1,000 mg by mouth every morning before breakfast.     ??? metoprolol succinate (TOPROL-XL) 100 MG 24 hr tablet Take 100 mg by mouth daily.     ??? multiple vitamin with vit K, adult, (MULTIPLE VITAMIN) 3,300 unit- 150 mcg/10 mL injection Infuse into a venous catheter.     ??? multivitamin capsule Take 1 capsule by mouth daily.     ??? pravastatin (PRAVACHOL) 20 MG tablet Take 20 mg by mouth daily.     ??? pyridoxine,  vitamin B6, (B-6) 100 MG tablet Take 200 mg by mouth daily.     ??? sennosides (SENOKOT EXTRA STRENGTH) 17.2 mg Tab Continuous.     ??? zinc sulfate 220 mg Tab tablet Take 1 tablet (220 mg total) by mouth daily. 30 tablet 0   ??? metoprolol succinate (TOPROL-XL) 100 MG 24 hr tablet Take 1 tablet (100 mg total) by mouth daily. 30 tablet 0   ??? pravastatin (PRAVACHOL) 10 MG tablet Take 1 tablet (10 mg total) by mouth daily. (Patient taking differently: Take 20 mg by mouth daily. ) 30 tablet 0     No current facility-administered medications for this visit.     Vital Signs:  BSA: There is no height or weight on file to calculate BSA.  Vitals:    06/30/20 1407   BP: 140/88   Pulse: 108   Resp: 20   Temp: 36.9 ??C (98.4 ??F)   SpO2: 92%     Physical Exam:  Constitutional: Obese woman sitting in wheelchair, no acute distress  Eyes: PERRL. No scleral icterus or conjunctival injection.  Cardiovascular:  RRR.  S1, S2.  No murmurs, gallops or rubs.  No edema.  Respiratory:  Breathing is unlabored.  CTAB. No rales, ronchi or crackles.    GI:  No distention or pain on palpation.  Bowel sounds are present.  No palpable masses.  Musculoskeletal:   strength equal bilaterally  Skin:  No rashes, petechiae or purpura.  grossly intact.   Neurologic:  alert and oriented to person/place, forced speech  Psychiatric: tangential requiring frequent re-direction    Relevant Laboratory, radiology and pathology results:  Appointment on 06/30/2020   Component Date Value Ref Range Status   ??? Sodium 06/30/2020 138  135 - 145 mmol/L Final   ??? Potassium 06/30/2020 3.7  3.4 - 4.8 mmol/L Final   ??? Chloride 06/30/2020 100  98 - 107 mmol/L Final   ??? CO2 06/30/2020 30.0  20.0 - 31.0 mmol/L Final   ??? Anion Gap 06/30/2020 8  5 - 14 mmol/L Final   ??? BUN 06/30/2020 <5 (A) 9 - 23 mg/dL Final   ??? Creatinine 06/30/2020 0.48 (A) 0.60 - 0.80 mg/dL Final   ??? eGFR CKD-EPI (2021) Female 06/30/2020 >90  >=60 mL/min/1.78m2 Final    eGFR calculated with CKD-EPI 2021 equation in accordance with [employer] and [employer] of Nephrology Task Force recommendations.   ??? Glucose 06/30/2020 136  70 - 179 mg/dL Final   ??? Calcium 96/29/5284 9.3  8.7 - 10.4 mg/dL Final   ??? Albumin 13/24/4010 4.1  3.4 - 5.0 g/dL Final   ??? Total Protein 06/30/2020 7.9  5.7 - 8.2 g/dL Final   ??? Total Bilirubin 06/30/2020 0.2 (A) 0.3 - 1.2 mg/dL Final   ??? AST 27/25/3664 17  <=34 U/L Final   ??? ALT 06/30/2020 12  10 - 49 U/L Final   ??? Alkaline Phosphatase 06/30/2020 70  46 - 116 U/L Final   ??? Collection 06/30/2020 Collected   Final   ??? WBC 06/30/2020 10.6  3.6 - 11.2 10*9/L Final   ??? RBC 06/30/2020 3.71 (A) 3.95 - 5.13 10*12/L Final   ??? HGB 06/30/2020 10.7 (A) 11.3 - 14.9 g/dL Final   ??? HCT 40/34/7425 32.6 (A) 34.0 - 44.0 % Final   ??? MCV 06/30/2020 87.8  77.6 - 95.7 fL Final   ??? MCH 06/30/2020 28.8  25.9 - 32.4 pg Final   ??? MCHC 06/30/2020 32.8  32.0 - 36.0  g/dL Final   ??? RDW 16/10/9602 16.9 (A) 12.2 - 15.2 % Final   ??? MPV 06/30/2020 7.6  6.8 - 10.7 fL Final   ??? Platelet 06/30/2020 289  150 - 450 10*9/L Final   ??? Neutrophils % 06/30/2020 61.2  % Final   ??? Lymphocytes % 06/30/2020 28.9  % Final   ??? Monocytes % 06/30/2020 7.1  % Final   ??? Eosinophils % 06/30/2020 1.9  % Final   ??? Basophils % 06/30/2020 0.9  % Final   ??? Absolute Neutrophils 06/30/2020 6.5  1.8 - 7.8 10*9/L Final   ??? Absolute Lymphocytes 06/30/2020 3.1  1.1 - 3.6 10*9/L Final   ??? Absolute Monocytes 06/30/2020 0.7  0.3 - 0.8 10*9/L Final   ??? Absolute Eosinophils 06/30/2020 0.2  0.0 - 0.5 10*9/L Final   ??? Absolute Basophils 06/30/2020 0.1  0.0 - 0.1 10*9/L Final   ??? Anisocytosis 06/30/2020 Slight (A) Not Present Final

## 2020-06-30 NOTE — Unmapped (Signed)
Addended by: Orpha Bur A on: 06/30/2020 03:43 PM     Modules accepted: Orders

## 2020-06-30 NOTE — Unmapped (Signed)
11:40am RN made aware unable to obtain patient labs in the lab. Lab work will need to be drawn on 2nd floor. RN went to go get the patient, but the patient was no longer in the lobby. Registration clerk Kimberly Mathis states patient told her that she was going downstairs and would be back. Kimberly Mathis said she would notify RN when patient returned. At 12:21, patient still had not returned to floor, so phone number for patient contact called (patient lives in a group home) and the person on the line states she had no way of contacting the patient or the person who was supposed to be with her/have dropped her off, but to call back if she did not return to the clinic. CMA Kimberly Mathis went downstairs to look for patient without success.

## 2020-06-30 NOTE — Unmapped (Signed)
Patient returning to unit. Blood drawn using (23) ga butterfly needle to (right forearm) successful (x1) attempt. Pt tolerated without difficulty. Blood work tubed to lab.

## 2020-07-02 DIAGNOSIS — C9210 Chronic myeloid leukemia, BCR/ABL-positive, not having achieved remission: Principal | ICD-10-CM

## 2020-07-09 NOTE — Unmapped (Signed)
[facility] Clinical Assessment & Refill Coordination Note    Kimberly Mathis, DOB: 07-Dec-1965  Phone: There are no phone numbers on file.    All above HIPAA information was verified with patient's caregiver, Deatra James (nurse).     Was a Nurse, learning disability used for this call? No    Specialty Medication(s):   Hematology/Oncology: Imatinib     Current Outpatient Medications   Medication Sig Dispense Refill   ??? acetaminophen (TYLENOL) 325 MG tablet acetaminophen 325 mg tablet   TAKE 3 TABLETS BY MOUTH TWICE DAILY.     ??? allopurinoL (ZYLOPRIM) 100 MG tablet Take 100 mg by mouth daily.     ??? clozapine (CLOZARIL) 100 MG tablet Take 150 mg by mouth in the morning. 150 mg in the AM and 300 mg at bedtime.     ??? diclofenac sodium (VOLTAREN) 1 % gel      ??? diphenoxylate-atropine (LOMOTIL) 2.5-0.025 mg per tablet Take 1 tablet by mouth four (4) times a day as needed for diarrhea.     ??? docusate sodium (COLACE) 100 MG capsule Take 100 mg by mouth Two (2) times a day.     ??? famotidine (PEPCID) 40 MG tablet      ??? folic acid (FOLVITE) 1 MG tablet Take 1 mg by mouth daily. (Patient not taking: Reported on 06/30/2020)     ??? imatinib (GLEEVEC) tablet Take 1 tablet (400 mg total) by mouth daily. 30 tablet 11   ??? lidocaine (LIDODERM) 5 % patch Place 1 patch on the skin daily. Apply to affected area for 12 hours only each day (then remove patch)     ??? lisinopriL (PRINIVIL,ZESTRIL) 2.5 MG tablet Take 2.5 mg by mouth daily. (Patient not taking: Reported on 06/30/2020)     ??? metFORMIN (GLUMETZA) 1000 MG (MOD) 24 hr tablet Take 1,000 mg by mouth daily before breakfast. 1000 mg in the AM and 500 mg at bedtime     ??? metoprolol succinate (TOPROL-XL) 25 MG 24 hr tablet Take 25 mg by mouth in the morning.     ??? multiple vitamin with vit K, adult, (MULTIPLE VITAMIN) 3,300 unit- 150 mcg/10 mL injection Infuse into a venous catheter.     ??? multivitamin capsule Take 1 capsule by mouth daily.     ??? pravastatin (PRAVACHOL) 10 MG tablet Take 1 tablet (10 mg total) by mouth daily. (Patient taking differently: Take 20 mg by mouth daily. ) 30 tablet 0   ??? pravastatin (PRAVACHOL) 20 MG tablet Take 20 mg by mouth daily.     ??? pyridoxine, vitamin B6, (B-6) 100 MG tablet Take 200 mg by mouth daily. (Patient not taking: Reported on 06/30/2020)     ??? QUEtiapine (SEROQUEL) 50 MG tablet Take 50 mg by mouth nightly.     ??? saliva stimulant comb. no.3 (BIOTENE MOISTURIZING MOUTH) Spry 1 spray every two (2) hours as needed.     ??? sennosides (SENOKOT EXTRA STRENGTH) 17.2 mg Tab Continuous. (Patient not taking: Reported on 06/30/2020)     ??? sertraline (ZOLOFT) 50 MG tablet Take 50 mg by mouth in the morning.     ??? valproic acid (DEPAKENE) 250 mg capsule Take 500 mg by mouth Three (3) times a day.     ??? zinc sulfate 220 mg Tab tablet Take 1 tablet (220 mg total) by mouth daily. (Patient not taking: Reported on 06/30/2020) 30 tablet 0     No current facility-administered medications for this visit.  Changes to medications: Ardys reports no changes at this time.    No Known Allergies    Changes to allergies: No    SPECIALTY MEDICATION ADHERENCE     Imatinib 400 mg: about 14-17 days of medicine on hand     Medication Adherence    Patient reported X missed doses in the last month: 0  Specialty Medication: Imatinib 400 mg once daily  Patient is on additional specialty medications: No  Informant: caregiver  Support network for adherence: home health agency  Confirmed plan for next specialty medication refill: delivery by pharmacy  Refills needed for supportive medications: not needed          Specialty medication(s) dose(s) confirmed: Regimen is correct and unchanged.     Are there any concerns with adherence? No    Adherence counseling provided? Not needed    CLINICAL MANAGEMENT AND INTERVENTION      Clinical Benefit Assessment:    Do you feel the medicine is effective or helping your condition? Yes    Clinical Benefit counseling provided? Not needed    Adverse Effects Assessment:    Are you experiencing any side effects? No    Are you experiencing difficulty administering your medicine? No    Quality of Life Assessment:    Quality of Life    Rheumatology  On a scale of 1 - 10 with 1 representing not at all and 10 representing completely - how has your rheumatologic condition affected your:  Oncology  1.On a scale of 1-10, rate your pain and/or discomfort you've experienced within the last week.: decline to answer  2.On a scale of 1-10, how has cancer symptoms and/or treatment side effects interfered with your ability to complete your normal daily activities within the last week?: decline to answer  Dermatology          How many days over the past month did your CML  keep you from your normal activities? For example, brushing your teeth or getting up in the morning. Patient declined to answer    Have you discussed this with your provider? Yes    Acute Infection Status:    Acute infections noted within Epic:  No active infections  Patient reported infection: None    Therapy Appropriateness:    Is therapy appropriate? Yes, therapy is appropriate and should be continued    DISEASE/MEDICATION-SPECIFIC INFORMATION      N/A    PATIENT SPECIFIC NEEDS     - Does the patient have any physical, cognitive, or cultural barriers? No    - Is the patient high risk? Yes, patient is taking oral chemotherapy. Appropriateness of therapy as been assessed    - Does the patient require a Care Management Plan? No     - Does the patient require physician intervention or other additional services (i.e. nutrition, smoking cessation, social work)? No      SHIPPING     Specialty Medication(s) to be Shipped:   Hematology/Oncology: Imatinib    Other medication(s) to be shipped: No additional medications requested for fill at this time     Changes to insurance: No    Delivery Scheduled: Yes, Expected medication delivery date: 07/23/20.     Medication will be delivered via Next Day Courier to the confirmed prescription address in [facility].    The patient will receive a drug information handout for each medication shipped and additional FDA Medication Guides as required.  Verified that patient has previously received a Conservation officer, historic buildings and  a Surveyor, mining.    The patient or caregiver noted above participated in the development of this care plan and knows that they can request review of or adjustments to the care plan at any time.      All of the patient's questions and concerns have been addressed.    Breck Coons [facility] Specialty Pharmacist

## 2020-07-22 MED FILL — IMATINIB 400 MG TABLET: ORAL | 30 days supply | Qty: 30 | Fill #8

## 2020-07-22 NOTE — Unmapped (Signed)
Spoke with Ms. Synetta Fail about scheduling a follow up clinic appointment for 10/6

## 2020-07-24 ENCOUNTER — Ambulatory Visit
Admit: 2020-07-24 | Discharge: 2020-07-27 | Disposition: A | Discharge status: Skilled Nursing Facility | Payer: MEDICARE | Admitting: Medical

## 2020-07-24 MED ADMIN — allopurinoL (ZYLOPRIM) tablet 100 mg: 100 mg | ORAL | @ 13:00:00

## 2020-07-24 MED ADMIN — lactated ringers bolus 1,000 mL: 1000 mL | INTRAVENOUS | @ 05:00:00 | Stop: 2020-07-24

## 2020-07-24 MED ADMIN — dexamethasone (DECADRON) 4 mg/mL injection 12 mg: 12 mg | INTRAVENOUS | @ 04:00:00 | Stop: 2020-07-24

## 2020-07-24 MED ADMIN — insulin lispro (HumaLOG) injection 0-15 Units: 0-15 [IU] | SUBCUTANEOUS | @ 14:00:00

## 2020-07-24 MED ADMIN — famotidine (PEPCID) tablet 20 mg: 20 mg | ORAL | @ 13:00:00

## 2020-07-24 MED ADMIN — divalproex (DEPAKOTE) DR tablet 500 mg: 500 mg | ORAL | @ 19:00:00

## 2020-07-24 MED ADMIN — insulin lispro (HumaLOG) injection 0-15 Units: 0-15 [IU] | SUBCUTANEOUS | @ 22:00:00

## 2020-07-24 MED ADMIN — metoprolol succinate (TOPROL-XL) 24 hr tablet 25 mg: 25 mg | ORAL | @ 13:00:00 | Stop: 2020-07-24

## 2020-07-24 MED ADMIN — ampicillin-sulbactam (UNASYN) 3 g in sodium chloride 0.9 % (NS) 100 mL IVPB connector bag: 3 g | INTRAVENOUS | @ 19:00:00 | Stop: 2020-07-29

## 2020-07-24 MED ADMIN — divalproex (DEPAKOTE) DR tablet 500 mg: 500 mg | ORAL | @ 13:00:00

## 2020-07-24 MED ADMIN — lidocaine (LIDODERM) 5 % patch 1 patch: 1 | TRANSDERMAL | @ 13:00:00

## 2020-07-24 MED ADMIN — ampicillin-sulbactam (UNASYN) 3 g in sodium chloride 0.9 % (NS) 100 mL IVPB connector bag: 3 g | INTRAVENOUS | @ 13:00:00 | Stop: 2020-07-29

## 2020-07-24 MED ADMIN — potassium & sodium phosphates 250mg (PHOS-NAK/NEUTRA PHOS) packet 1 packet: 1 | ORAL | @ 13:00:00 | Stop: 2020-07-24

## 2020-07-24 MED ADMIN — insulin lispro (HumaLOG) injection 0-15 Units: 0-15 [IU] | SUBCUTANEOUS | @ 18:00:00

## 2020-07-24 MED ADMIN — lactated ringers bolus 1,000 mL: 1000 mL | INTRAVENOUS | @ 02:00:00 | Stop: 2020-07-23

## 2020-07-24 MED ADMIN — imatinib (GLEEVEC) tablet 400 mg: 400 mg | ORAL | @ 16:00:00

## 2020-07-24 MED ADMIN — chlorhexidine (PERIDEX) 0.12 % solution 15 mL: 15 mL | OROMUCOSAL | @ 13:00:00

## 2020-07-24 MED ADMIN — cloZAPine (CLOZARIL) tablet 150 mg: 150 mg | ORAL | @ 16:00:00

## 2020-07-24 MED ADMIN — iohexoL (OMNIPAQUE) 300 mg iodine/mL solution 75 mL: 75 mL | INTRAVENOUS | @ 03:00:00 | Stop: 2020-07-23

## 2020-07-24 MED ADMIN — sodium chloride 0.9% (NS) bolus 1,000 mL: 1000 mL | INTRAVENOUS | @ 10:00:00 | Stop: 2020-07-24

## 2020-07-24 MED ADMIN — sertraline (ZOLOFT) tablet 50 mg: 50 mg | ORAL | @ 13:00:00

## 2020-07-24 MED ADMIN — sodium chloride 0.9% (NS) bolus 1,000 mL: 1000 mL | INTRAVENOUS | @ 13:00:00 | Stop: 2020-07-24

## 2020-07-24 MED ADMIN — vancomycin (VANCOCIN) 1500 mg in sodium chloride (NS) 0.9 % 500 mL IVPB (premix): 1500 mg | INTRAVENOUS | @ 21:00:00 | Stop: 2020-07-29

## 2020-07-24 MED ADMIN — magnesium sulfate in water 2 gram/50 mL (4 %) IVPB 2 g: 2 g | INTRAVENOUS | @ 14:00:00 | Stop: 2020-07-24

## 2020-07-24 MED ADMIN — vancomycin (VANCOCIN) 2000 mg IVPB in 500 mL sodium chloride 0.9% (premix): 2000 mg | INTRAVENOUS | @ 08:00:00 | Stop: 2020-07-24

## 2020-07-24 MED ADMIN — sodium chloride (NS) 0.9 % infusion: 125 mL/h | INTRAVENOUS | @ 07:00:00

## 2020-07-24 MED ADMIN — enoxaparin (LOVENOX) syringe 40 mg: 40 mg | SUBCUTANEOUS | @ 13:00:00

## 2020-07-24 MED ADMIN — ampicillin-sulbactam (UNASYN) 3 g in sodium chloride 0.9 % (NS) 100 mL IVPB connector bag: 3 g | INTRAVENOUS | @ 06:00:00 | Stop: 2020-07-24

## 2020-07-24 MED ADMIN — sodium chloride (NS) 0.9 % infusion: 125 mL/h | INTRAVENOUS | @ 19:00:00

## 2020-07-24 NOTE — Unmapped (Signed)
[facility]:  Daily Progress Note      Admission Date:  07/23/2020       Assessment & Plan     Principal Problem:    Sepsis (CMS-HCC)  Active Problems:    Type II diabetes mellitus (CMS-HCC)    HTN (hypertension)    Gout    GERD (gastroesophageal reflux disease)    Morbid obesity (CMS-HCC)    Schizoaffective disorder, bipolar type (CMS-HCC)    CML (chronic myelocytic leukemia) (CMS-HCC)    Acute pharyngitis    Sore throat    Lactic acidemia    Dyslipidemia  Resolved Problems:    * No resolved hospital problems. *     Sepsis 2/2 acute pharyngitis   - patient with history of CML on Gleevec, presented with tachycardia, elevated lactate. WBC 10.7K. All other vitals normal  - continue IV Unasyn and vancomycin   - continue trending lactate (5.4-->4.9-->4.4)  - continue aggressive IVF hydration   - BC x 2 pending   - supportive care  - patient able to eat and drink with no issues   ??  Type 2 diabetes mellitus  - hold metformin  - FSBS ACHS, SSI  - Hgb A1c pending    HypoMg2+  - K+ WNL  - replete and monitor.  ??  Essential hypertension   - Resume beta-blocker   - ACE-I noted on med rec but patient states she doesn't take  - continue to monitor vitals  ??  Morbid obesity with a BMI of 43 kg/m??  ??  Schizoaffective disorder, bipolar type  - On clozapine  - per pharmacy, patient needs psych eval while inpatient to obtain this medication so consult has been placed.  ??  History of CML  - On Gleevec    Anemia  - baseline Hgb around 10, currently 9.6  - no s/sx of bleeding  - monitor.    DVT prophylaxis:  enoxaparin  Anticipated disposition: To Home  Estimated discharge:  2 days    I have reviewed the patient's most recent labs and radiology studies.    Consults:  none     Subjective     Patient resting in bed. States she is able to swallow with no issues. Ate dinner last night and just ordered breakfast. States symptoms have been going on for a few days.    Objective     Vital signs in last 24 hours:  Temp:  [36.5 ??C (97.7 ??F)-37.3 ??C (99.1 ??F)] 36.5 ??C (97.7 ??F)  Heart Rate:  [95-127] 95  Resp:  [4-27] 26  BP: (135-151)/(73-102) 144/88  MAP (mmHg):  [97-115] 97  SpO2:  [94 %-99 %] 95 %  Oxygen Therapy     Date/Time Resp SpO2 O2 Device O2 Flow Rate (L/min)    07/24/20 0820 26 95 % -- --    07/24/20 0256 20 97 % -- --    07/24/20 0221 17 94 % -- --    07/24/20 0220 27 -- -- --    07/24/20 0215 13 -- -- --    07/24/20 0210 26 -- -- --    07/24/20 0205 22 -- -- --    07/24/20 0200 18 -- -- --    07/24/20 01:55:01 16 -- -- --    07/24/20 01:50:01 18 -- -- --    07/24/20 01:45:01 15 -- -- --    07/24/20 01:40:01 16 -- -- --    07/24/20 01:35:01 14 -- -- --    07/24/20 01:30:01  20 -- -- --    07/24/20 01:25:01 27 -- -- --           Physical Exam  Constitutional:       General: She is not in acute distress.     Appearance: She is obese.      Comments: Garbled speech   HENT:      Head: Normocephalic.   Cardiovascular:      Rate and Rhythm: Normal rate and regular rhythm.   Pulmonary:      Effort: Pulmonary effort is normal. No respiratory distress.      Breath sounds: No stridor.   Abdominal:      General: Bowel sounds are normal.      Palpations: Abdomen is soft.   Musculoskeletal:         General: Normal range of motion.      Cervical back: No tenderness.   Lymphadenopathy:      Cervical: Cervical adenopathy present.   Neurological:      Mental Status: She is oriented to person, place, and time.

## 2020-07-24 NOTE — Unmapped (Signed)
55 yr old patient BIBA from [facility] rehab facility presents to ED c/o abdominal pain, sore throat, and headache x3 days. Pt denies N/V/D. Hx of schizophrenia.

## 2020-07-24 NOTE — Unmapped (Signed)
Bed: 15  Expected date:   Expected time:   Means of arrival:   Comments:  EMS- Abd pain, HA

## 2020-07-24 NOTE — Unmapped (Signed)
0300 - New admission from the ED came to unit breathing freely on room air in no obvious distress. Skin assessment done with ID - CNA, skin intact throughout. Lactic acid elevated 5.4, Dr. Burley Saver made aware and pt received normal saline bolus. Repeat lactic 4.9. Continuous normal saline progressing at 159mls/hr. No complaints and or concerns voiced at this time. Patient oriented to room, snack box given and purwick applied for strict I+Os.

## 2020-07-24 NOTE — Unmapped (Signed)
Clinical Pharmacy Consult: Vancomycin Initiation    Kimberly Mathis is a 55 y.o. female being initiated on vancomycin for a potential bloodstream infection. Pharmacy consult requested by Dr Burley Saver.    Past Medical History:   Diagnosis Date    GERD (gastroesophageal reflux disease)     Gout     HTN (hypertension)     Morbid obesity (CMS-HCC)     Osteoporosis     Schizoaffective disorder, bipolar type (CMS-HCC)     Type II diabetes mellitus (CMS-HCC)         Allergies:  No Known Allergies    Objective Data:   Ht Readings from Last 1 Encounters:   03/04/20 171.5 cm (5' 7.5)     Wt Readings from Last 1 Encounters:   07/24/20 (!) 145.1 kg (319 lb 14.2 oz)       Ideal body weight: 62.7 kg (138 lb 5.4 oz)  Adjusted ideal body weight: 95.7 kg (210 lb 15.3 oz)  Body mass index is 49.36 kg/m??.      TMax: Temp (24hrs), Avg:37.1 ??C (98.7 ??F), Min:36.8 ??C (98.2 ??F), Max:37.3 ??C (99.1 ??F)       Microbiology:   blood cultures are pending collection.  Rapid group A strep PCR is negative.     Recent Labs:  Recent Labs   Lab Units 07/23/20  2238 07/23/20  2210   WBC 10*9/L  --  10.7*   BUN mg/dL 10  --    CREATININE mg/dL 1.61  --              CrCl (estimated): Estimated Creatinine Clearance: 174.4 mL/min (based on SCr of 0.57 mg/dL). mL/min  CrCl capped at 95 ml/min based on patient age, per [facility] renal function capping protocol.    Renal function stable? (SCr has not fluctuated by >0.2 mg/dL in past 48 hours): yes    Intake/Output Summary (Last 24 hours): No intake/output data recorded.    Concurrent antimicrobials:  Unasyn 3 g IV q6h    Therapeutic Goals:   Goal: 12-17    Pharmacokinetics:   Population Ke 0.073h-1  Population Vd 75L  Population T1/2 9h      Assessment/Plan:    Pharmacist care plan is as follows:    Vancomycin 2000 mg IV x 1 dose, then will start vancomycin 1500 mg IV q12h thereafter.  Obtain vancomycin trough level at 0430 on 07/26/20.  Next serum creatinine will be obtained on: 07/25/20  Pharmacy will continue to monitor and adjust regimen as necessary.  Patient will be followed by pharmacy for changes in renal function, toxicity, and efficacy.    Anne Fu PharmD

## 2020-07-24 NOTE — Unmapped (Addendum)
[facility] ENCOUNTER      CHIEF COMPLAINT    Chief Complaint   Patient presents with   ??? Abdominal Pain       HPI    Kimberly Mathis is a 55 y.o. female with PMHx of schizoaffective disorder, chronic myelocytic leukemia, DM, HTN, and morbid obesity who presents from assisted living facility with sore throat for the last 3 days.  She complains of difficulty swallowing, drooling, voice change, and neck swelling.  She also has chest pain but this overlies an abrasion under her bra.  Constant and worsening.  No alleviating or aggravating factors.  She denies fevers, tongue swelling, cough, SOB, abdominal pain, N/V/D, and urinary symptoms.      She is a poor historian.    Per chart review patient was seen by heme-onc for follow-up of known CML and advised to continue Imatinib 400mg  daily.  Note from 06/30/20 reports patient had a prolonged illness a year ago, when she was trached, she has a hard time swallowing pills.    PCP: Marcy Siren, PA      REVIEW OF SYSTEMS    Review of Systems   Constitutional: Negative for fever.   HENT: Positive for sore throat.    Eyes: Negative for redness.   Respiratory: Negative for cough and shortness of breath.    Cardiovascular: Positive for chest pain.   Gastrointestinal: Positive for abdominal pain. Negative for diarrhea, nausea and vomiting.   Genitourinary: Negative for dysuria and hematuria.   Musculoskeletal: Negative for myalgias.   Skin: Negative for rash.   Neurological: Positive for headaches.   Psychiatric/Behavioral: Negative for depression.         PAST MEDICAL HISTORY    Past Medical History:   Diagnosis Date   ??? GERD (gastroesophageal reflux disease)    ??? Gout    ??? HTN (hypertension)    ??? Morbid obesity (CMS-HCC)    ??? Osteoporosis    ??? Schizoaffective disorder, bipolar type (CMS-HCC)    ??? Type II diabetes mellitus (CMS-HCC)        SURGICAL HISTORY    No past surgical history on file.    CURRENT MEDICATIONS      Current Facility-Administered Medications:   ???  ampicillin-sulbactam (UNASYN) 3 g in sodium chloride 0.9 % (NS) 100 mL IVPB connector bag, 3 g, Intravenous, Once, Shadrack Brummitt, DO  ???  ketorolac (TORADOL) injection 15 mg, 15 mg, Intravenous, Q6H PRN, Burman Freestone, MD  ???  lactated ringers bolus 1,000 mL, 1,000 mL, Intravenous, Once, Jathniel Smeltzer, DO, 1,000 mL at 07/24/20 0054  ???  MORPhine injection 1 mg, 1 mg, Intravenous, Q2H PRN **OR** MORPhine injection 2 mg, 2 mg, Intravenous, Q2H PRN, Burman Freestone, MD  ???  sodium chloride (NS) 0.9 % infusion, 125 mL/hr, Intravenous, Continuous, Burman Freestone, MD    Current Outpatient Medications:   ???  acetaminophen (TYLENOL) 325 MG tablet, acetaminophen 325 mg tablet  TAKE 3 TABLETS BY MOUTH TWICE DAILY., Disp: , Rfl:   ???  allopurinoL (ZYLOPRIM) 100 MG tablet, Take 100 mg by mouth daily., Disp: , Rfl:   ???  clozapine (CLOZARIL) 100 MG tablet, Take 150 mg by mouth in the morning. 150 mg in the AM and 300 mg at bedtime., Disp: , Rfl:   ???  diclofenac sodium (VOLTAREN) 1 % gel, , Disp: , Rfl:   ???  diphenoxylate-atropine (LOMOTIL) 2.5-0.025 mg per tablet, Take 1 tablet by mouth four (4) times a day as needed for  diarrhea., Disp: , Rfl:   ???  docusate sodium (COLACE) 100 MG capsule, Take 100 mg by mouth Two (2) times a day., Disp: , Rfl:   ???  famotidine (PEPCID) 40 MG tablet, , Disp: , Rfl:   ???  folic acid (FOLVITE) 1 MG tablet, Take 1 mg by mouth daily. (Patient not taking: Reported on 06/30/2020), Disp: , Rfl:   ???  imatinib (GLEEVEC) 400 MG tablet, Take 1 tablet (400 mg total) by mouth daily., Disp: 30 tablet, Rfl: 11  ???  lidocaine (LIDODERM) 5 % patch, Place 1 patch on the skin daily. Apply to affected area for 12 hours only each day (then remove patch), Disp: , Rfl:   ???  lisinopriL (PRINIVIL,ZESTRIL) 2.5 MG tablet, Take 2.5 mg by mouth daily. (Patient not taking: Reported on 06/30/2020), Disp: , Rfl:   ???  metFORMIN (GLUMETZA) 1000 MG (MOD) 24 hr tablet, Take 1,000 mg by mouth daily before breakfast. 1000 mg in the AM and 500 mg at bedtime, Disp: , Rfl:   ???  metoprolol succinate (TOPROL-XL) 25 MG 24 hr tablet, Take 25 mg by mouth in the morning., Disp: , Rfl:   ???  multiple vitamin with vit K, adult, (MULTIPLE VITAMIN) 3,300 unit- 150 mcg/10 mL injection, Infuse into a venous catheter., Disp: , Rfl:   ???  multivitamin capsule, Take 1 capsule by mouth daily., Disp: , Rfl:   ???  pravastatin (PRAVACHOL) 10 MG tablet, Take 1 tablet (10 mg total) by mouth daily. (Patient taking differently: Take 20 mg by mouth daily. ), Disp: 30 tablet, Rfl: 0  ???  pravastatin (PRAVACHOL) 20 MG tablet, Take 20 mg by mouth daily., Disp: , Rfl:   ???  pyridoxine, vitamin B6, (B-6) 100 MG tablet, Take 200 mg by mouth daily. (Patient not taking: Reported on 06/30/2020), Disp: , Rfl:   ???  QUEtiapine (SEROQUEL) 50 MG tablet, Take 50 mg by mouth nightly., Disp: , Rfl:   ???  saliva stimulant comb. no.3 (BIOTENE MOISTURIZING MOUTH) Spry, 1 spray every two (2) hours as needed., Disp: , Rfl:   ???  sennosides (SENOKOT EXTRA STRENGTH) 17.2 mg Tab, Continuous. (Patient not taking: Reported on 06/30/2020), Disp: , Rfl:   ???  sertraline (ZOLOFT) 50 MG tablet, Take 50 mg by mouth in the morning., Disp: , Rfl:   ???  valproic acid (DEPAKENE) 250 mg capsule, Take 500 mg by mouth Three (3) times a day., Disp: , Rfl:   ???  zinc sulfate 220 mg Tab tablet, Take 1 tablet (220 mg total) by mouth daily. (Patient not taking: Reported on 06/30/2020), Disp: 30 tablet, Rfl: 0    ALLERGIES    No Known Allergies    FAMILY HISTORY    No family history on file.    SOCIAL HISTORY    Social History     Socioeconomic History   ??? Marital status: Single   Tobacco Use   ??? Smoking status: Former Smoker     Packs/day: 1.00     Types: Cigarettes   ??? Smokeless tobacco: Never Used   Vaping Use   ??? Vaping Use: Never used   Substance and Sexual Activity   ??? Alcohol use: Not Currently   ??? Drug use: Not Currently       PHYSICAL EXAM    VITAL SIGNS:   ED Triage Vitals [07/23/20 2038]   Enc Vitals Group BP 139/73      Heart Rate 112      SpO2 Pulse  Resp 18      Temp 36.8 ??C (98.2 ??F)      Temp Source Oral      SpO2 96 %      Weight       Height       Head Circumference       Peak Flow       Pain Score       Pain Loc       Pain Edu?       Excl. in GC?          Physical Exam  Vitals and nursing note reviewed.   Constitutional:       Appearance: Normal appearance. She is obese.   HENT:      Head: Normocephalic and atraumatic.      Nose: Nose normal.      Mouth/Throat:      Mouth: Mucous membranes are moist.      Pharynx: Oropharynx is clear.      Comments: Floor of mouth appears slightly raised but soft.  No appreciable PTA however view was difficult.    Uvula appears to be midline.  Eyes:      Extraocular Movements: Extraocular movements intact.      Pupils: Pupils are equal, round, and reactive to light.   Neck:      Comments: Old midline scar, appears to be healed trach.  No purulent discharge or overlying erythema.  Cardiovascular:      Rate and Rhythm: Tachycardia present.      Pulses: Normal pulses.   Pulmonary:      Effort: Pulmonary effort is normal.      Breath sounds: No wheezing, rhonchi or rales.   Abdominal:      General: Bowel sounds are normal. There is no distension.      Palpations: Abdomen is soft.      Tenderness: There is no abdominal tenderness. There is no guarding.   Musculoskeletal:         General: Normal range of motion.      Cervical back: Normal range of motion.   Skin:     General: Skin is warm and dry.      Capillary Refill: Capillary refill takes less than 2 seconds.      Comments: Abrasion to epigastric region, at braline.   Neurological:      General: No focal deficit present.      Mental Status: She is alert and oriented to person, place, and time.      Comments: Poor insight/cognition.           PROCEDURES      EKG Interpretation    Encounter Date: 07/23/20   ECG 12 Lead   Result Value    EKG Systolic BP     EKG Diastolic BP     EKG Ventricular Rate 108    EKG Atrial Rate 108    EKG P-R Interval 172    EKG QRS Duration 82    EKG Q-T Interval 360    EKG QTC Calculation 482    EKG Calculated P Axis 61    EKG Calculated R Axis -24    EKG Calculated T Axis 59    QTC Fredericia 438    Narrative    SINUS TACHYCARDIA  POSSIBLE LEFT ATRIAL ENLARGEMENT   SEPTAL INFARCT  , AGE UNDETERMINED  ABNORMAL ECG  WHEN COMPARED WITH ECG OF 05-Oct-2002 17:07,  NONSPECIFIC T WAVE ABNORMALITY NOW EVIDENT IN LATERAL LEADS  QT HAS LENGTHENED  Confirmed by Abelardo Diesel ([id number]) on 07/24/2020 12:47:17 AM       Cardiac Monitor  The cardiac monitor reveals sinus tachycardia as interpreted by me.  The cardiac monitor was ordered secondary to the patient's history of CP and to monitor the patient for dysrhythmia.    ED COURSE & MEDICAL DECISION MAKING    Patient is a 55 y.o. female who presented with sore throat x 3 days with associated voice change and subjective neck swelling. No fevers.  No CP or SOB.  Patient is a poor historian.  On arrival she is tachycardic to 110s otherwise afebrile and HDS. Exam as above difficult 2/2 body habitus. She sounds hoarse.  Floor of mouth soft but slightly elevated. No stridor, drooling, tripoding, tachypnea, or hypoxia. No visible PTA.  No unilateral neck swelling.  No overlying skin changes.  Will obtain labs, cultures, and CT neck to further assess.  DDx bacterial pharyngitis, epiglottitis, pertussis, Lemierre's syndrome, PTA, RPA, Ludwig's, PNA.  IVF and decadron given.    Labs:   CBC with WBC 10.7, Hgb 9.9  CMP without gross electrolyte abnormalities, kidney dysfunction, or liver dysfunction  Troponin 6  BNP 70  Lactic acid 2.5-->2.5  Imaging:   CT neck soft tissue with IV contrast-  1.    Trace edema within the peritonsillar and parapharyngeal soft tissues compatible with pharyngitis. No organized fluid collection or abscess.  2.    Mildly prominent cervical lymph nodes, likely reactive.  CXR - Borderline enlarged cardiac silhouette, no evidence for overt failure.  Elevated right diaphragm. Diminished lung volumes.  Medications: The patient received the following medications during ED stay:  Medications   lactated ringers bolus 1,000 mL (1,000 mL Intravenous New Bag 07/24/20 0054)   ampicillin-sulbactam (UNASYN) 3 g in sodium chloride 0.9 % (NS) 100 mL IVPB connector bag (has no administration in time range)   sodium chloride (NS) 0.9 % infusion (has no administration in time range)   MORPhine injection 1 mg (has no administration in time range)     Or   MORPhine injection 2 mg (has no administration in time range)   ketorolac (TORADOL) injection 15 mg (has no administration in time range)   lactated ringers bolus 1,000 mL (0 mL Intravenous Stopped 07/24/20 0033)   iohexoL (OMNIPAQUE) 300 mg iodine/mL solution 75 mL (75 mL Intravenous Given 07/23/20 2312)   dexamethasone (DECADRON) 4 mg/mL injection 12 mg (12 mg Intravenous Given 07/24/20 0029)       Re-assessed:  0040 - Workup as above significant for pharyngitis.  Initial thought was viral pharyngitis however given persistent lactic acidosis, tachycardia, and mild leukocytosis, will cover for bacterial process with IV Unasyn.  Strep was negative.  CT without visible abscess or or fluid collection.  No evidence for Lemierre's or Ludwigs.  CXR clear without evidence of PNA.  Of note, she is also immunocompromised on Imatinib for CML. TSH added along with COVID swab.  Discussed case with hospitalist who will admit.  Patient has been able to tolerate water in the department.  Remains HDS.    Old records reviewed.  Pertinent labs, imaging, EKG, and electronic medical records available were reviewed and interpreted by myself and used in the MDM.  Addressed all patient questions and concerns.      CLINICAL IMPRESSION:   1. Pharyngitis, unspecified etiology    2. Lactic acidosis    3. Hx of chronic myeloid leukemia        ED  DISPOSITION:   Admit    COVID-19 Statement:  Frady Taddeo was evaluated in the Emergency Department on July 23, 2020 8:39 PM for the symptoms described in the history of present illness. Patient was evaluated in the context of the global COVID-19 pandemic, which necessitated consideration that the patient might be at risk for infection with the SARS-CoV-2 virus that causes COVID-19. Institutional protocols and algorithms that pertain to the evaluation of patients at risk for COVID-19 are in a state of rapid change based on information released by regulatory bodies including the CDC and federal and state organizations. These policies and algorithms were followed during the patient's care in the ED.             Abelardo Diesel, DO  07/24/20 0109

## 2020-07-24 NOTE — Unmapped (Addendum)
[facility]:   History & Physical        Primary Care Provider:  Marcy Siren, PA  Other Providers:   Consider ENT consult deterioration    Assessment & Plan     Principal Problem:    Sepsis (CMS-HCC)  Active Problems:    Type II diabetes mellitus (CMS-HCC)    HTN (hypertension)    Gout    GERD (gastroesophageal reflux disease)    Morbid obesity (CMS-HCC)    Schizoaffective disorder, bipolar type (CMS-HCC)    CML (chronic myelocytic leukemia) (CMS-HCC)    Acute pharyngitis    Sore throat    Lactic acidemia       Plan of care    1.  Sepsis: Patient presented with sepsis and tachycardia and found to have a leukocytosis with lactic acidosis while neck soft tissues CT scan confirmed acute pharyngitis.  No obvious abscess formation.  The patient however is immunosuppressed having history of CML and kept on Gleevec  .  Broad-spectrum antibiotic coverage with IV Unasyn and vancomycin  Blood culture, UA with reflex urine culture    2.  Acute pharyngitis: On IV Unasyn and vancomycin  The patient presented no evidence of stridor or threatened airway and she is currently eating a sandwich and denies any ongoing sore throat after having been given IV Decadron  Pain control.  IV morphine and/oral Toradol  Monitor CBC/ANC as the patient is only on Gleevec which might  lead to severe sore throat in the context of aplastic anemia but also on clozapine susceptible due to trigger.  Pure Red cell aplasia    3.  Sore throat: As needed IV Toradol (preferably ) or IV morphine    4.  Persistent lactic acidosis: The patient is tachycardic and found to have leukocytosis, lactic acidosis is felt to be due to sepsis however, the patient is already on metformin  Proper IV hydration receiving treatment, repeat lactic acid level tomorrow morning  Check procalcitonin    5.  Type 2 diabetes mellitus: Currently off metformin amid lactic acidosis  Initiated low-dose ACE of subcutaneous insulin but avoid hypoglycemic episodes  Hemoglobin A1c    6.  Essential hypertension: Resume beta-blocker and consider restarting ACE inhibitors  Blood pressure being around 147/86    7.  History of gout: On allopurinol    8.  GERD: Protonix provided    9.  Morbid obesity with a BMI of 43 kg/m??: No acute intervention    10.  Schizoaffective disorder, bipolar type: On clozapine    11.  History of CML: On Gleevec    Transfer dyslipidemia: On statin, check lipid profile in a.m.    DVT prophylaxis-treatment Lovenox    Expected length of stay: 24 to 48 hours    DVT prophylaxis:  enoxaparin  Anticipated disposition: To Home with Home Health    Chief Complaint     Chief Complaint   Patient presents with   ??? Abdominal Pain       History Of Present Illness   Kimberly Mathis is a  55 y.o.  female patient with medical history of schizoaffective disorder, bipolar type, CML on Gleevec, type 2 diabetes mellitus on metformin, essential hypertension, morbid obesity with a BMI of 43 kg/m2, gout, GERD, who presented to the emergency room complaining of a 3-day history of sore throat and abdominal pain.  The patient denies any significant drooling, fever or chills, nausea vomiting, diarrhea or urinary symptoms.  In the emergency room, she was  presented tachycardic up to 112 bpm but with a normal blood pressure and temperature 98.2. Work-up reveals a leukocytosis of 12.7 with 50 pounds of neutrophil and 5.4 absolute neutrophil count, hemoglobin/hematocrit of 9.9/30.3 with MCV of 87.7, platelet of 295.  No significant electrolyte Imbalance and creatinine is 0.57 while blood glucose is 103 mg/dL.  TSH is 1.89, troponin 6 and proBNP 17.  Her lactate however was 2.5.  Though Chest x-ray showed no acute infiltrate or consolidation but revealing only some cardiomegaly without overt failure, the neck soft tissue CT scan revealed trace edema within the peritonsillar and parapharyngeal soft tissue compatible with pharyngitis.  No organized fluid collection or abscess.  Mildly prominent cervical lymph node likely reactive.  The patient was bolused with 2 L of lactated Ringer's in the emergency room with mild improvement of her heart rate down to 107 and after 2 sets of blood culture were sent, IV Unasyn was initiated.  She was then provided with 12 mg of IV Decadron and we have been called for potential admission.  During our evaluation, the patient was having a large dinner and she denies any ongoing sore throat, hoarse voice.  No evidence of respiratory distress on stridor.  The patient to be initiated on IV fluid maintenance at 125 mL/h prior to admission to medical floor for observation where she will be kept on IV Unasyn and vancomycin but with close airway surveillance..  We will follow blood culture result and request a UA with reflex urine culture     Review Of Systems   A 12 system review of systems was negative except as noted in HPI.    Allergies   Patient has no known allergies.    Home Medications     Prior to Admission medications    Medication Dose, Route, Frequency   acetaminophen (TYLENOL) 325 MG tablet acetaminophen 325 mg tablet   TAKE 3 TABLETS BY MOUTH TWICE DAILY.   allopurinoL (ZYLOPRIM) 100 MG tablet 100 mg, Oral, Daily (standard)   clozapine (CLOZARIL) 100 MG tablet 150 mg, Oral, Daily (standard), 150 mg in the AM and 300 mg at bedtime   diclofenac sodium (VOLTAREN) 1 % gel No dose, route, or frequency recorded.   diphenoxylate-atropine (LOMOTIL) 2.5-0.025 mg per tablet 1 tablet, Oral, 4 times a day PRN   docusate sodium (COLACE) 100 MG capsule 100 mg, Oral, 2 times a day (standard)   famotidine (PEPCID) 40 MG tablet No dose, route, or frequency recorded.   folic acid (FOLVITE) 1 MG tablet 1 mg, Daily (standard)  Patient not taking: Reported on 06/30/2020   imatinib (GLEEVEC) 400 MG tablet Take 1 tablet (400 mg total) by mouth daily.   lidocaine (LIDODERM) 5 % patch 1 patch, Transdermal, Every 24 hours, Apply to affected area for 12 hours only each day (then remove patch)   lisinopriL (PRINIVIL,ZESTRIL) 2.5 MG tablet 2.5 mg, Daily (standard)  Patient not taking: Reported on 06/30/2020   metFORMIN (GLUMETZA) 1000 MG (MOD) 24 hr tablet 1,000 mg, Oral, Daily before breakfast, 1000 mg in the AM and 500 mg at bedtime   metoprolol succinate (TOPROL-XL) 25 MG 24 hr tablet 25 mg, Oral, Daily (standard)   multiple vitamin with vit K, adult, (MULTIPLE VITAMIN) 3,300 unit- 150 mcg/10 mL injection Intravenous   multivitamin capsule 1 capsule, Oral, Daily (standard)   pravastatin (PRAVACHOL) 10 MG tablet 10 mg, Oral, Daily (standard)  Patient taking differently: Take 20 mg by mouth daily.    pravastatin (PRAVACHOL) 20 MG  tablet 20 mg, Oral, Daily (standard)   pyridoxine, vitamin B6, (B-6) 100 MG tablet 200 mg, Daily (standard)  Patient not taking: Reported on 06/30/2020   QUEtiapine (SEROQUEL) 50 MG tablet 50 mg, Oral, Nightly   saliva stimulant comb. no.3 (BIOTENE MOISTURIZING MOUTH) Spry 1 spray, Every 2 hours PRN   sennosides (SENOKOT EXTRA STRENGTH) 17.2 mg Tab Continuous  Patient not taking: Reported on 06/30/2020   sertraline (ZOLOFT) 50 MG tablet 50 mg, Oral, Daily (standard)   valproic acid (DEPAKENE) 250 mg capsule 500 mg, Oral, 3 times a day (standard)   zinc sulfate 220 mg Tab tablet 220 mg, Oral, Daily (standard)  Patient not taking: Reported on 06/30/2020     Medical History     Past Medical History:   Diagnosis Date   ??? GERD (gastroesophageal reflux disease)    ??? Gout    ??? HTN (hypertension)    ??? Morbid obesity (CMS-HCC)    ??? Osteoporosis    ??? Schizoaffective disorder, bipolar type (CMS-HCC)    ??? Type II diabetes mellitus (CMS-HCC)        Surgical History   No past surgical history on file.    Social History   Living situation:  home with family  Marital status:  single  Occupational status: unemployed    Social History     Social History Narrative   ??? Not on file     Social History     Tobacco Use   ??? Smoking status: Former Smoker     Packs/day: 1.00     Types: Cigarettes   ??? Smokeless tobacco: Never Used   Vaping Use   ??? Vaping Use: Never used   Substance Use Topics   ??? Alcohol use: Not Currently   ??? Drug use: Not Currently       Family History   No family history on file.  No family status information on file.       Code Status   Full Code    Objective   Temp:  [36.8 ??C (98.2 ??F)] 36.8 ??C (98.2 ??F)  Heart Rate:  [105-112] 107  Resp:  [13-18] 13  BP: (135-147)/(73-96) 147/86  MAP (mmHg):  [105-110] 105  SpO2:  [95 %-96 %] 95 %    Physical Exam     General: Well-developed patient cooperative and able to speak in full sentence.  No hoarseness of voice, no evidence of respiratory distress or threatening airway.  Actually, the patient was eating a sandwich when I stepped in to perform my physical exam    Neuro:No focal motor or sensory deficit,normal speech, cranial nerves II through XII intact  CV: Sinus tachycardia, S 1/S2 heard no murmur, gallop or rub  Pulmonary: lungs are clear to auscultation/no rales,wheezes or rhonchi  Abdomen:soft, non-tender, bowel sounds present,no rebound  MSK/ Extremities: Upper and lower extremity showed no edema, clubbing or calf tenderness      HEENT: Head is atraumatic and normocephalic/Extraocular muscles  intact  GU: No hematuria, pyuria or CVA tenderness  SKIN:skin is  Warm and normal in color  PSYCH: stable mood and normal affect   Lab Results     Recent Labs     07/23/20  2210   WBC 10.7*   HGB 9.9*   HCT 30.3*   PLT 295     Recent Labs     07/23/20  2238 07/24/20  0029   NA 144  --    K 4.4  --    CL  105  --    CO2 32.0*  --    BUN 10  --    CREATININE 0.57  --    GLU 103  --    CALCIUM 8.4*  --    PROT 7.4  --    BILITOT 0.2*  --    AST 19  --    ALT 12  --    ALKPHOS 58  --    TSH 1.894  --    LACTATE  --  2.5*     Recent Labs     07/23/20  2238   TROPONINI 6     No results for input(s): WBCUA, NITRITE, LEUKOCYTESUR, BACTERIA, RBCUA, BLOODU, GLUCOSEU, PROTEINUA, KETONESU, KETUR in the last 72 hours.  No results for input(s): OPIAU, BENZU, TRICYCLIC, PCPU, AMPHU, COCAU, CANNAU, BARBU, ETOH, ACETAMIN, SALICYLATE in the last 72 hours.  No results for input(s): PREGTESTUR, PREGPOC in the last 72 hours.  No results for input(s): OCCULTBLD, RAPSCRN, CDIFRPCR, CDIFFNAP1, A1C, CHOL, LDL, HDL, TRIG in the last 72 hours.  No results for input(s): O2SOUR, FIO2ART, PHART, PCO2ART, PO2ART, HCO3ART, O2SATART, BEART in the last 72 hours.  Pending Labs     Order Current Status    Blood Culture In process    Blood Culture In process    Hemoglobin A1c In process    Procalcitonin In process          Imaging   ECG 12 Lead    Result Date: 07/24/2020  SINUS TACHYCARDIA POSSIBLE LEFT ATRIAL ENLARGEMENT SEPTAL INFARCT  , AGE UNDETERMINED ABNORMAL ECG WHEN COMPARED WITH ECG OF 05-Oct-2002 17:07, NONSPECIFIC T WAVE ABNORMALITY NOW EVIDENT IN LATERAL LEADS QT HAS LENGTHENED Confirmed by Rice, Erika ([id number]) on 07/24/2020 12:47:17 AM    XR Chest 2 views    Result Date: 07/23/2020  Exam:  Chest Two Views History:  Chest pain Technique:  2 views Comparison:  04/01/2019 Findings:  Lung volumes are diminished and there is associated crowding of bronchovascular markings. Mildly elevated right diaphragm. No airspace consolidation or pleural effusion. Cardiac silhouette is borderline enlarged. Degenerative marginal spurring noted throughout the thoracic spine.     Borderline enlarged cardiac silhouette, no evidence for overt failure. Elevated right diaphragm. Diminished lung volumes. Signed (Electronic Signature): 07/23/2020 11:43 PM Signed By: Rosalee Kaufman, MD    CT Neck Soft Tissue W Contrast    Result Date: 07/23/2020  Exam:  CT Neck Soft Tissue with Contrast History:  Sore throat Technique: Routine soft tissue neck CT with IV contrast. AEC (automated exposure control) and/or manual techniques such as size-specific kV and mAs are employed where appropriate to reduce radiation exposure for all CT exams. Comparison:  None. Findings: VISIBLE PARANASAL SINUSES, MASTOID AIR CELLS, AND MIDDLE EARS: Normal. POSTERIOR NASOPHARYNX, TONSILLAR PILLARS, TONGUE BASE, AND PIRIFORM SINUSES:  Trace edema within the peritonsillar and parapharyngeal soft tissues. No organized collection or abscess. FLOOR OF MOUTH:  Normal. RETROPHARYNGEAL AND PARAPHARYNGEAL SPACES:  No significant edema within the midline retropharyngeal soft tissues. There is no midline retropharyngeal abscess. MAJOR SALIVARY GLANDS:  Normal. SUPERFICIAL SOFT TISSUES:  Unremarkable. EPIGLOTTIS AND ARYEPIGLOTTIC FOLDS:  Normal LARYNGEAL APPARATUS:  Normal. LYMPH NODES:  Mildly prominent cervical lymph nodes measure up to 10 mm short axis diameter. No suppurative nodes identified. THYROID GLAND:  Normal. MAJOR ARTERIES AND VEINS OF THE NECK:  Unremarkable. LUNG APICES:  Unremarkable. CERVICAL SPINE:  Anterior marginal spurring in the lower cervical spine. No significant bony central canal stenosis.  1.    Trace edema within the peritonsillar and parapharyngeal soft tissues compatible with pharyngitis. No organized fluid collection or abscess. 2.    Mildly prominent cervical lymph nodes, likely reactive. Signed (Electronic Signature): 07/23/2020 11:33 PM Signed By: Rosalee Kaufman, MD      ECG   Sinus tachycardia at 108 bpm with possible left atrial enlargement    Disclaimer - This chart has been created using [employer]. Chart creation errors have been sought, but may not always be located and such creation errors do NOT reflect on the standard of medical care.

## 2020-07-25 MED ADMIN — docusate sodium (COLACE) capsule 100 mg: 100 mg | ORAL | @ 02:00:00

## 2020-07-25 MED ADMIN — insulin lispro (HumaLOG) injection 0-15 Units: 0-15 [IU] | SUBCUTANEOUS | @ 13:00:00

## 2020-07-25 MED ADMIN — QUEtiapine (SEROquel) tablet 50 mg: 50 mg | ORAL | @ 02:00:00

## 2020-07-25 MED ADMIN — famotidine (PEPCID) tablet 20 mg: 20 mg | ORAL | @ 13:00:00

## 2020-07-25 MED ADMIN — ampicillin-sulbactam (UNASYN) 3 g in sodium chloride 0.9 % (NS) 100 mL IVPB connector bag: 3 g | INTRAVENOUS | @ 06:00:00 | Stop: 2020-07-29

## 2020-07-25 MED ADMIN — chlorhexidine (PERIDEX) 0.12 % solution 15 mL: 15 mL | OROMUCOSAL | @ 13:00:00

## 2020-07-25 MED ADMIN — docusate sodium (COLACE) capsule 100 mg: 100 mg | ORAL | @ 13:00:00

## 2020-07-25 MED ADMIN — divalproex (DEPAKOTE) DR tablet 500 mg: 500 mg | ORAL | @ 02:00:00

## 2020-07-25 MED ADMIN — cloZAPine (CLOZARIL) tablet 300 mg: 300 mg | ORAL | @ 02:00:00

## 2020-07-25 MED ADMIN — ampicillin-sulbactam (UNASYN) 3 g in sodium chloride 0.9 % (NS) 100 mL IVPB connector bag: 3 g | INTRAVENOUS | @ 01:00:00 | Stop: 2020-07-29

## 2020-07-25 MED ADMIN — vancomycin (VANCOCIN) 1500 mg in sodium chloride (NS) 0.9 % 500 mL IVPB (premix): 1500 mg | INTRAVENOUS | @ 09:00:00 | Stop: 2020-07-25

## 2020-07-25 MED ADMIN — sertraline (ZOLOFT) tablet 50 mg: 50 mg | ORAL | @ 13:00:00

## 2020-07-25 MED ADMIN — divalproex (DEPAKOTE) DR tablet 500 mg: 500 mg | ORAL | @ 13:00:00

## 2020-07-25 MED ADMIN — ampicillin-sulbactam (UNASYN) 3 g in sodium chloride 0.9 % (NS) 100 mL IVPB connector bag: 3 g | INTRAVENOUS | @ 13:00:00 | Stop: 2020-07-29

## 2020-07-25 MED ADMIN — metoprolol tartrate (LOPRESSOR) tablet 25 mg: 25 mg | ORAL | @ 13:00:00

## 2020-07-25 MED ADMIN — ampicillin-sulbactam (UNASYN) 3 g in sodium chloride 0.9 % (NS) 100 mL IVPB connector bag: 3 g | INTRAVENOUS | @ 18:00:00 | Stop: 2020-07-29

## 2020-07-25 MED ADMIN — cloZAPine (CLOZARIL) tablet 150 mg: 150 mg | ORAL | @ 13:00:00

## 2020-07-25 MED ADMIN — metoprolol tartrate (LOPRESSOR) tablet 25 mg: 25 mg | ORAL | @ 02:00:00

## 2020-07-25 MED ADMIN — allopurinoL (ZYLOPRIM) tablet 100 mg: 100 mg | ORAL | @ 13:00:00

## 2020-07-25 MED ADMIN — famotidine (PEPCID) tablet 20 mg: 20 mg | ORAL | @ 02:00:00

## 2020-07-25 MED ADMIN — imatinib (GLEEVEC) tablet 400 mg: 400 mg | ORAL | @ 13:00:00

## 2020-07-25 MED ADMIN — divalproex (DEPAKOTE) DR tablet 500 mg: 500 mg | ORAL | @ 18:00:00

## 2020-07-25 MED ADMIN — lidocaine (LIDODERM) 5 % patch 1 patch: 1 | TRANSDERMAL | @ 13:00:00

## 2020-07-25 MED ADMIN — vancomycin (VANCOCIN) 1500 mg in sodium chloride (NS) 0.9 % 500 mL IVPB (premix): 1500 mg | INTRAVENOUS | @ 21:00:00 | Stop: 2020-07-25

## 2020-07-25 MED ADMIN — pravastatin (PRAVACHOL) tablet 20 mg: 20 mg | ORAL | @ 02:00:00

## 2020-07-25 MED ADMIN — insulin lispro (HumaLOG) injection 0-15 Units: 0-15 [IU] | SUBCUTANEOUS | @ 01:00:00

## 2020-07-25 MED ADMIN — sodium chloride (NS) 0.9 % infusion: 125 mL/h | INTRAVENOUS | @ 13:00:00 | Stop: 2020-07-25

## 2020-07-25 MED ADMIN — multivitamins, therapeutic with minerals tablet 1 tablet: 1 | ORAL | @ 13:00:00

## 2020-07-25 MED ADMIN — enoxaparin (LOVENOX) syringe 40 mg: 40 mg | SUBCUTANEOUS | @ 13:00:00

## 2020-07-25 NOTE — Unmapped (Signed)
Problem: Adult Inpatient Plan of Care  Goal: Plan of Care Review  Outcome: Ongoing - Unchanged  Goal: Patient-Specific Goal (Individualized)  Outcome: Ongoing - Unchanged  Flowsheets (Taken 07/25/2020 1331)  Patient-Specific Goals (Include Timeframe): Patient will remain free from falls this shift.  Note: Vital signs are stable. Patient is alert and oriented x 2, disoriented to place and situation. Falls and safety precautions in place. No falls this shift. No complaints of pain. No outstanding needs at the moment.   Goal: Absence of Hospital-Acquired Illness or Injury  Outcome: Ongoing - Unchanged  Intervention: Identify and Manage Fall Risk  Recent Flowsheet Documentation  Taken 07/25/2020 1300 by Delrae Rend, RN  Safety Interventions:   bed alarm   fall reduction program maintained   lighting adjusted for tasks/safety   low bed   nonskid shoes/slippers when out of bed  Taken 07/25/2020 1100 by Delrae Rend, RN  Safety Interventions:   bed alarm   fall reduction program maintained   lighting adjusted for tasks/safety   low bed   nonskid shoes/slippers when out of bed  Taken 07/25/2020 0900 by Delrae Rend, RN  Safety Interventions:   bed alarm   fall reduction program maintained   low bed   lighting adjusted for tasks/safety   nonskid shoes/slippers when out of bed  Taken 07/25/2020 0700 by Delrae Rend, RN  Safety Interventions:   bed alarm   fall reduction program maintained   lighting adjusted for tasks/safety   low bed   nonskid shoes/slippers when out of bed  Intervention: Prevent and Manage VTE (Venous Thromboembolism) Risk  Recent Flowsheet Documentation  Taken 07/25/2020 0700 by Delrae Rend, RN  Activity Management: activity adjusted per tolerance  Goal: Optimal Comfort and Wellbeing  Outcome: Ongoing - Unchanged  Goal: Readiness for Transition of Care  Outcome: Ongoing - Unchanged  Goal: Rounds/Family Conference  Outcome: Ongoing - Unchanged

## 2020-07-25 NOTE — Unmapped (Signed)
PHYSICAL THERAPY  Evaluation (07/25/20 1123)          Patient Name:?? Kimberly Mathis????????   Medical Record Number: [id number]   Date of Birth: 1965/01/05  Sex: Female??  ??    Treatment Diagnosis: functional mobility impairment     Activity Tolerance: Limited by Mental Status     ASSESSMENT  Assessment : Pt is a 55 y/o female admitted with sepsis. PMH significant for Type II diabetes mellitus, HTN, Gout, GERD, Morbid obesity, Schizoaffective disorder (bipolar type), CML (chronic myelocytic leukemia),  Acute pharyngitis, and Lactic acidemia. She is received sitting up in bed and agreeable to PT eval. Pt demonstrates modI rolling in bed and ability to complete 1x5 B SLR. She refuses further mobility this date despite encouragement. She was left sitting up in bed with all needs in reach, CNA in room.      Today's Interventions: PT eval, bed mobility, 1x5 B SLR, educated on importance of continued mobility, educated on PT POC      Clinical Decision Making: Moderate      PLAN  Planned Frequency of Treatment:?? 1x per day for: 3-5x week       Planned Interventions: Education - Patient, Gait training, Home exercise program, Neuromuscular re-education, Transfer training, Therapeutic activity, Therapeutic exercise, Wheelchair training     Post-Discharge Physical Therapy Recommendations:?? 5x weekly     PT DME Recommendations: Defer to post acute??????????       Goals:   Patient and Family Goals: take a bath like I normally do     SHORT GOAL #1: Pt will complete bed mobility modI  ?????????????????????? Time Frame : 2 weeks  SHORT GOAL #2: transfer goal when appropriate  ?????????????????????? Time Frame : 2 weeks  SHORT GOAL #3: gait goal when appropriate  ?????????????????????? Time Frame : 2 weeks     SUBJECTIVE  Patient reports: I want to take a bath later today        Prior Functional Status: Pt is a questionable historian. She reports she receives assistance with bed level sponge bathing and dressing. Pt originally states she has not been OOB in >3 months however later states she recently d/c from PT because she was doing too good walking. Pt later points to RW in room and states I got one of those. They said I can use that one when I get up however states she is non-ambulatory at baseline.  Equipment available at home: [employer]      Past Medical History:   Diagnosis Date   ??? GERD (gastroesophageal reflux disease)    ??? Gout    ??? HTN (hypertension)    ??? Morbid obesity (CMS-HCC)    ??? Osteoporosis    ??? Schizoaffective disorder, bipolar type (CMS-HCC)    ??? Type II diabetes mellitus (CMS-HCC)             Social History     Tobacco Use   ??? Smoking status: Former Smoker     Packs/day: 1.00     Types: Cigarettes   ??? Smokeless tobacco: Never Used   Substance Use Topics   ??? Alcohol use: Not Currently       No past surgical history on file.          History reviewed. No pertinent family history.     Allergies: Patient has no known allergies.      Objective Findings  Precautions / Restrictions  Precautions: Falls precautions, Aspiration precautions  Weight Bearing Status: Non-applicable  Required Braces  or Orthoses: Non-applicable     Communication Preference: Verbal          Pain Comments: L lower leg     Equipment / Environment: Purewick/Condom catheter, Vascular access (PIV, TLC, Port-a-cath, PICC)     Living Situation  Living Environment: Facility  Lives With: Care Staff      Cognition: Impaired/Limited       Lower Extremities  LE Strength: Right Impaired/Limited, Left Impaired/Limited  RLE Strength Impairment: Reduced strength  LLE Strength Impairment: Reduced strength    Bed Mobility: Rolling Right, Rolling Left  Rolling right assistance level: Modified independent, requires aide device or extra time  Rolling left assistance level: Modified independent, requires aide device or extra time  Bed Mobility: use of rail and momentum    AM-PAC-5 click  Difficulty turning over In bed?: None - Modified Independent/Independent  Difficulty sitting down/standing up from chair with arms? : Unable to do/total assistance - Total Dependent Assist  Difficulty moving from supine to sitting on edge of bed?: Unable to do/total assistance - Total Dependent Assist  Help moving to and from bed from wheelchair?: Unable to do/total assistance - Total Dependent Assist  Help currently needed walking in a hospital room?: Unable to do/total assistance - Total Dependent Assist      Basic Mobility Score:  8    Score (in points): % of Functional Impairment, Limitation, Restriction  5: 100% impaired, limited, restricted  6-7: At least 80%, but less than 100% impaired, limited restricted  8-11: At least 60%, but less than 80% impaired, limited restricted  12-16: At least 40%, but less than 60% impaired, limited restricted  17-18: At least 20%, but less than 40% impaired, limited restricted  19: At least 1%, but less than 20% impaired, limited restricted  20: 0% impaired, limited restricted     Physical Therapy Session Duration  PT Individual [mins]: 22     Medical Staff Made Aware: RN, CNA     I attest that I have reviewed the above information.  Signed: Janine Ores, PT  Filed 07/25/2020

## 2020-07-25 NOTE — Unmapped (Addendum)
Pt alert, very fatigued/drowsy this shift. VSS. Voiding. IV abx cont. Tolerating diet. PT/OT consulted. Per pharmacy they verified meds w/ SNF. Afebrile this shift. IV fluids cont. As ordered. Blood cultures pending. NSR w/ sinus tach on tele. Denies SOB/chest pain    Sepsis dx and work up on admit      Problem: Adult Inpatient Plan of Care  Goal: Plan of Care Review  Outcome: Progressing  Flowsheets (Taken 07/24/2020 1727)  Progress: no change  Plan of Care Reviewed With: patient  Goal: Patient-Specific Goal (Individualized)  Outcome: Progressing  Goal: Absence of Hospital-Acquired Illness or Injury  Outcome: Progressing  Intervention: Identify and Manage Fall Risk  Recent Flowsheet Documentation  Taken 07/24/2020 1500 by Iline Oven, RN  Safety Interventions:  ??? fall reduction program maintained  ??? lighting adjusted for tasks/safety  ??? low bed  ??? nonskid shoes/slippers when out of bed  Taken 07/24/2020 1300 by Iline Oven, RN  Safety Interventions:  ??? fall reduction program maintained  ??? lighting adjusted for tasks/safety  ??? low bed  ??? nonskid shoes/slippers when out of bed  Taken 07/24/2020 1100 by Iline Oven, RN  Safety Interventions:  ??? fall reduction program maintained  ??? lighting adjusted for tasks/safety  ??? low bed  Taken 07/24/2020 0900 by Iline Oven, RN  Safety Interventions:  ??? bed alarm  ??? fall reduction program maintained  ??? lighting adjusted for tasks/safety  ??? low bed  ??? nonskid shoes/slippers when out of bed  Intervention: Prevent Skin Injury  Recent Flowsheet Documentation  Taken 07/24/2020 1100 by Iline Oven, RN  Skin Protection: adhesive use limited  Taken 07/24/2020 0900 by Iline Oven, RN  Skin Protection: adhesive use limited  Intervention: Prevent and Manage VTE (Venous Thromboembolism) Risk  Recent Flowsheet Documentation  Taken 07/24/2020 1500 by Iline Oven, RN  Activity Management: activity adjusted per tolerance  Taken 07/24/2020 1300 by Iline Oven, RN  Activity Management: activity encouraged  Taken 07/24/2020 1100 by Iline Oven, RN  Activity Management: activity adjusted per tolerance  Taken 07/24/2020 0900 by Iline Oven, RN  Activity Management: activity adjusted per tolerance  Goal: Optimal Comfort and Wellbeing  Outcome: Progressing  Goal: Readiness for Transition of Care  Outcome: Progressing  Goal: Rounds/Family Conference  Outcome: Progressing     Problem: Fall Injury Risk  Goal: Absence of Fall and Fall-Related Injury  Outcome: Progressing  Intervention: Promote Injury-Free Environment  Recent Flowsheet Documentation  Taken 07/24/2020 1500 by Iline Oven, RN  Safety Interventions:  ??? fall reduction program maintained  ??? lighting adjusted for tasks/safety  ??? low bed  ??? nonskid shoes/slippers when out of bed  Taken 07/24/2020 1300 by Iline Oven, RN  Safety Interventions:  ??? fall reduction program maintained  ??? lighting adjusted for tasks/safety  ??? low bed  ??? nonskid shoes/slippers when out of bed  Taken 07/24/2020 1100 by Iline Oven, RN  Safety Interventions:  ??? fall reduction program maintained  ??? lighting adjusted for tasks/safety  ??? low bed  Taken 07/24/2020 0900 by Iline Oven, RN  Safety Interventions:  ??? bed alarm  ??? fall reduction program maintained  ??? lighting adjusted for tasks/safety  ??? low bed  ??? nonskid shoes/slippers when out of bed     Problem: Skin Injury Risk Increased  Goal: Skin Health and Integrity  Outcome: Progressing  Intervention: Optimize Skin Protection  Recent Flowsheet Documentation  Taken 07/24/2020 1500 by Bernadene Person  Letita Prentiss, RN  Pressure Reduction Techniques: frequent weight shift encouraged  Head of Bed [facility]) Positioning: HOB at 30 degrees  Taken 07/24/2020 1300 by Iline Oven, RN  Pressure Reduction Techniques:  ??? frequent weight shift encouraged  ??? heels elevated off bed  Taken 07/24/2020 1100 by Iline Oven, RN  Pressure Reduction Techniques: frequent weight shift encouraged  Head of Bed (HOB) Positioning: HOB at 30 degrees  Pressure Reduction Devices: pressure-redistributing mattress utilized  Skin Protection: adhesive use limited  Taken 07/24/2020 0900 by Iline Oven, RN  Pressure Reduction Techniques: frequent weight shift encouraged  Head of Bed (HOB) Positioning: HOB at 30 degrees  Pressure Reduction Devices: pressure-redistributing mattress utilized  Skin Protection: adhesive use limited

## 2020-07-25 NOTE — Unmapped (Signed)
Pt alert and responsive, VSS on RA. BS at hs 208.  Pt resting in bed and in no acute distress.  Pt tolerating scheduled IV antibiotic therapy.  Lactate trending down (3.1).     Problem: Adult Inpatient Plan of Care  Goal: Plan of Care Review  Outcome: Progressing  Goal: Patient-Specific Goal (Individualized)  Outcome: Progressing  Goal: Absence of Hospital-Acquired Illness or Injury  Outcome: Progressing  Intervention: Identify and Manage Fall Risk  Recent Flowsheet Documentation  Taken 07/24/2020 1930 by Sharyn Blitz, RN  Safety Interventions:   bed alarm   fall reduction program maintained   lighting adjusted for tasks/safety   low bed   nonskid shoes/slippers when out of bed  Intervention: Prevent Skin Injury  Recent Flowsheet Documentation  Taken 07/24/2020 1930 by Josph Macho Murillo-Estrada, RN  Skin Protection: adhesive use limited  Intervention: Prevent and Manage VTE (Venous Thromboembolism) Risk  Recent Flowsheet Documentation  Taken 07/25/2020 0500 by Josph Macho Murillo-Estrada, RN  Activity Management: activity adjusted per tolerance  Taken 07/25/2020 0300 by Coletta Lockner Murillo-Estrada, RN  Activity Management: activity adjusted per tolerance  Taken 07/25/2020 0100 by Lunetta Marina Murillo-Estrada, RN  Activity Management: activity adjusted per tolerance  Taken 07/24/2020 2300 by Donnalyn Juran Murillo-Estrada, RN  Activity Management: activity adjusted per tolerance  Taken 07/24/2020 2100 by Raef Sprigg Murillo-Estrada, RN  Activity Management: activity adjusted per tolerance  Taken 07/24/2020 1930 by Ceclia Koker Murillo-Estrada, RN  Activity Management: activity adjusted per tolerance  Goal: Optimal Comfort and Wellbeing  Outcome: Progressing  Goal: Readiness for Transition of Care  Outcome: Progressing  Goal: Rounds/Family Conference  Outcome: Progressing     Problem: Fall Injury Risk  Goal: Absence of Fall and Fall-Related Injury  Outcome: Progressing  Intervention: Promote Injury-Free Environment  Recent Flowsheet Documentation  Taken 07/24/2020 1930 by Josph Macho Murillo-Estrada, RN  Safety Interventions:   bed alarm   fall reduction program maintained   lighting adjusted for tasks/safety   low bed   nonskid shoes/slippers when out of bed     Problem: Skin Injury Risk Increased  Goal: Skin Health and Integrity  Outcome: Progressing  Intervention: Optimize Skin Protection  Recent Flowsheet Documentation  Taken 07/25/2020 0500 by Huie Ghuman Murillo-Estrada, RN  Head of Bed [facility]) Positioning: HOB at 20-30 degrees  Taken 07/25/2020 0300 by Sharnell Knight Murillo-Estrada, RN  Head of Bed [facility]) Positioning: HOB at 20-30 degrees  Taken 07/25/2020 0100 by Minh Roanhorse Murillo-Estrada, RN  Head of Bed [facility]) Positioning: HOB at 20-30 degrees  Taken 07/24/2020 2300 by Damonica Chopra Murillo-Estrada, RN  Head of Bed [facility]) Positioning: HOB at 20-30 degrees  Taken 07/24/2020 2100 by Mckyla Deckman Murillo-Estrada, RN  Head of Bed [facility]) Positioning: HOB at 20-30 degrees  Taken 07/24/2020 1930 by Melizza Kanode Murillo-Estrada, RN  Pressure Reduction Techniques: frequent weight shift encouraged  Head of Bed (HOB) Positioning: HOB at 20-30 degrees  Pressure Reduction Devices: pressure-redistributing mattress utilized  Skin Protection: adhesive use limited     Problem: Self-Care Deficit  Goal: Improved Ability to Complete Activities of Daily Living  Outcome: Progressing

## 2020-07-25 NOTE — Unmapped (Signed)
CLINICAL PHARMACY CONSULT: VANCOMYCIN MONITORING   Kimberly Mathis is a 55 y.o. female treated with vancomycin for Bacteremia as follows:    Current Regimen: Vancomycin 1500 mg IV every 12 hours    Day of therapy: 2  Planned Duration: Unknown/To Be Determined  Vancomycin trough goal:12-17 mcg/mL    Interval History:     Temp (24hrs), Avg:36.7 ??C (98.1 ??F), Min:36.4 ??C (97.5 ??F), Max:37.2 ??C (99 ??F)     Recent Labs   Lab Units 07/25/20  0437 07/24/20  0426 07/24/20  0425 07/23/20  2238 07/23/20  2210   WBC 10*9/L 13.7* 10.1  --   --  10.7*   BUN mg/dL  --   --  9 10  --    CREATININE mg/dL  --   --  1.61 0.96  --          Renal Function:  CrCl (estimated): Estimated Creatinine Clearance: 180.8 mL/min (based on SCr of 0.55 mg/dL). mL/min  CrCl capped at 95 mL/min based on patient age, per [facility] renal function capping protocol.    Renal function stable? (SCr has not fluctuated by >0.2 mg/dL in past 48 hours): Yes    Microbiology:  Positive Cultures: BCx NGTD    Concurrent antimicrobials:  Unasyn 07/23-    Recent Drug Levels   No results in the last week     Assessment/Plan  Based on renal function indices and recent drug levels: continue current regimen  Next vancomycin level will be obtained: 07/24 at 16:30  Repeat SCr will be assessed: 07/24    Leonard Schwartz

## 2020-07-25 NOTE — Unmapped (Signed)
[facility]:  Daily Progress Note      Admission Date:  07/23/2020       Assessment & Plan     Principal Problem:    Sepsis (CMS-HCC)  Active Problems:    Type II diabetes mellitus (CMS-HCC)    HTN (hypertension)    Gout    GERD (gastroesophageal reflux disease)    Morbid obesity (CMS-HCC)    Schizoaffective disorder, bipolar type (CMS-HCC)    CML (chronic myelocytic leukemia) (CMS-HCC)    Acute pharyngitis    Sore throat    Lactic acidemia    Dyslipidemia    Lactic acidosis  Resolved Problems:    * No resolved hospital problems. *     Sepsis 2/2 acute pharyngitis   - patient with history of CML on Gleevec, presented with tachycardia, elevated lactate. WBC 10.7K-->13.7K. All other vitals normal. Strep neg. Flu swab ordered.  - continue IV Unasyn and vancomycin   - continue trending lactate (5.4-->4.9-->4.4-->3.1)  - discontinue fluids  - BC x 2 NGTD  - supportive care  - patient able to eat and drink with no issues   ??  Type 2 diabetes mellitus  - hold metformin  - FSBS ACHS, SSI  - Hgb A1c 6.7    HypoMg2+, resolved.  - monitor.  ??  Essential hypertension   - Resume beta-blocker   - ACE-I noted on med rec but patient states she doesn't take  - continue to monitor vitals  ??  Morbid obesity with a BMI of 43 kg/m??  ??  Schizoaffective disorder, bipolar type  - On clozapine  - per pharmacy, patient needs psych eval while inpatient to obtain this medication so consult has been placed however psychiatry does not do evaluations at [facility] or virtual visits.  - if needs to stay inpatient on 7/25 then will plan to transfer to [place] [facility] for psych eval  ??  History of CML  - On Gleevec    Anemia  - baseline Hgb around 10, currently 9.5  - no s/sx of bleeding  - monitor.    DVT prophylaxis:  enoxaparin  Anticipated disposition: To Home  Estimated discharge:  tomorrow? 7/25    I have reviewed the patient's most recent labs and radiology studies.    Consults:  none     Subjective     Patient resting in bed. Was very drowsy yesterday. Awakes to voice this AM. States throat is feeling better.     Objective     Vital signs in last 24 hours:  Temp:  [36.4 ??C (97.5 ??F)-37.2 ??C (99 ??F)] 36.6 ??C (97.9 ??F)  Heart Rate:  [82-106] 100  Resp:  [14-19] 18  BP: (128-142)/(86-95) 137/90  MAP (mmHg):  [95-106] 106  FiO2 (%):  [21 %] 21 %  SpO2:  [93 %-99 %] 95 %  Oxygen Therapy     Date/Time Resp SpO2 O2 Device O2 Flow Rate (L/min)    07/25/20 0814 -- 95 % -- --    07/25/20 0354 18 99 % None (Room air) --           Physical Exam  Constitutional:       General: She is not in acute distress.     Appearance: She is obese.   HENT:      Head: Normocephalic.   Cardiovascular:      Rate and Rhythm: Normal rate and regular rhythm.   Pulmonary:      Effort: Pulmonary effort is normal. No  respiratory distress.      Breath sounds: No stridor.   Abdominal:      General: Bowel sounds are normal.      Palpations: Abdomen is soft.   Musculoskeletal:         General: Normal range of motion.      Cervical back: No tenderness.   Lymphadenopathy:      Cervical: No cervical adenopathy.   Neurological:      Mental Status: She is oriented to person, place, and time.

## 2020-07-26 MED ADMIN — lidocaine (LIDODERM) 5 % patch 1 patch: 1 | TRANSDERMAL | @ 14:00:00

## 2020-07-26 MED ADMIN — QUEtiapine (SEROquel) tablet 50 mg: 50 mg | ORAL

## 2020-07-26 MED ADMIN — ampicillin-sulbactam (UNASYN) 3 g in sodium chloride 0.9 % (NS) 100 mL IVPB connector bag: 3 g | INTRAVENOUS | @ 19:00:00 | Stop: 2020-07-29

## 2020-07-26 MED ADMIN — cloZAPine (CLOZARIL) tablet 300 mg: 300 mg | ORAL

## 2020-07-26 MED ADMIN — pravastatin (PRAVACHOL) tablet 20 mg: 20 mg | ORAL

## 2020-07-26 MED ADMIN — multivitamins, therapeutic with minerals tablet 1 tablet: 1 | ORAL | @ 14:00:00

## 2020-07-26 MED ADMIN — ampicillin-sulbactam (UNASYN) 3 g in sodium chloride 0.9 % (NS) 100 mL IVPB connector bag: 3 g | INTRAVENOUS | @ 01:00:00 | Stop: 2020-07-29

## 2020-07-26 MED ADMIN — famotidine (PEPCID) tablet 20 mg: 20 mg | ORAL | @ 14:00:00

## 2020-07-26 MED ADMIN — imatinib (GLEEVEC) tablet 400 mg: 400 mg | ORAL | @ 14:00:00

## 2020-07-26 MED ADMIN — ampicillin-sulbactam (UNASYN) 3 g in sodium chloride 0.9 % (NS) 100 mL IVPB connector bag: 3 g | INTRAVENOUS | @ 07:00:00 | Stop: 2020-07-29

## 2020-07-26 MED ADMIN — vancomycin (VANCOCIN) 1750 mg in sodium chloride (NS) 0.9 % 500 mL IVPB (premix): 1750 mg | INTRAVENOUS | @ 09:00:00 | Stop: 2020-07-31

## 2020-07-26 MED ADMIN — chlorhexidine (PERIDEX) 0.12 % solution 15 mL: 15 mL | OROMUCOSAL

## 2020-07-26 MED ADMIN — allopurinoL (ZYLOPRIM) tablet 100 mg: 100 mg | ORAL | @ 14:00:00

## 2020-07-26 MED ADMIN — enoxaparin (LOVENOX) syringe 40 mg: 40 mg | SUBCUTANEOUS | @ 14:00:00

## 2020-07-26 MED ADMIN — metoprolol tartrate (LOPRESSOR) tablet 25 mg: 25 mg | ORAL | @ 14:00:00

## 2020-07-26 MED ADMIN — docusate sodium (COLACE) capsule 100 mg: 100 mg | ORAL

## 2020-07-26 MED ADMIN — famotidine (PEPCID) tablet 20 mg: 20 mg | ORAL

## 2020-07-26 MED ADMIN — divalproex (DEPAKOTE) DR tablet 500 mg: 500 mg | ORAL

## 2020-07-26 MED ADMIN — sertraline (ZOLOFT) tablet 50 mg: 50 mg | ORAL | @ 14:00:00

## 2020-07-26 MED ADMIN — insulin lispro (HumaLOG) injection 0-15 Units: 0-15 [IU] | SUBCUTANEOUS | @ 01:00:00

## 2020-07-26 MED ADMIN — vancomycin (VANCOCIN) 1750 mg in sodium chloride (NS) 0.9 % 500 mL IVPB (premix): 1750 mg | INTRAVENOUS | @ 23:00:00 | Stop: 2020-07-31

## 2020-07-26 MED ADMIN — divalproex (DEPAKOTE) DR tablet 500 mg: 500 mg | ORAL | @ 19:00:00

## 2020-07-26 MED ADMIN — divalproex (DEPAKOTE) DR tablet 500 mg: 500 mg | ORAL | @ 14:00:00

## 2020-07-26 MED ADMIN — cloZAPine (CLOZARIL) tablet 150 mg: 150 mg | ORAL | @ 14:00:00

## 2020-07-26 MED ADMIN — metoprolol tartrate (LOPRESSOR) tablet 25 mg: 25 mg | ORAL

## 2020-07-26 MED ADMIN — chlorhexidine (PERIDEX) 0.12 % solution 15 mL: 15 mL | OROMUCOSAL | @ 14:00:00

## 2020-07-26 MED ADMIN — ampicillin-sulbactam (UNASYN) 3 g in sodium chloride 0.9 % (NS) 100 mL IVPB connector bag: 3 g | INTRAVENOUS | @ 14:00:00 | Stop: 2020-07-29

## 2020-07-26 MED ADMIN — enoxaparin (LOVENOX) syringe 40 mg: 40 mg | SUBCUTANEOUS

## 2020-07-26 MED ADMIN — docusate sodium (COLACE) capsule 100 mg: 100 mg | ORAL | @ 14:00:00

## 2020-07-26 NOTE — Unmapped (Signed)
[facility]:  Daily Progress Note      Admission Date:  07/23/2020       Assessment & Plan     Principal Problem:    Sepsis (CMS-HCC)  Active Problems:    Type II diabetes mellitus (CMS-HCC)    HTN (hypertension)    Gout    GERD (gastroesophageal reflux disease)    Morbid obesity (CMS-HCC)    Schizoaffective disorder, bipolar type (CMS-HCC)    CML (chronic myelocytic leukemia) (CMS-HCC)    Acute pharyngitis    Sore throat    Lactic acidemia    Dyslipidemia    Lactic acidosis  Resolved Problems:    * No resolved hospital problems. *     Sepsis 2/2 acute pharyngitis   - patient with history of CML on Gleevec, presented with tachycardia, elevated lactate. WBC 10.7K-->13.7K-->14.7. All other vitals normal. Strep neg. Flu swab ordered but patient refused swab  - continue IV Unasyn and vancomycin   - continue trending lactate (5.4-->4.9-->4.4-->3.1)  - discontinue fluids  - BC x 2 NGTD  - supportive care  - patient able to eat and drink with no issues     Persistent leukocytosis  - unsure as to cause of her WBC trending up  - she's been on IV antibiotics for 3 days now, vitals are stable and clinically seems to be improving  - continue to trend with differential  - has continued on Gleevec since admission as well  - no steroids have been given.  - get speech eval for ? Aspiration given unclear speech at times  ??  Type 2 diabetes mellitus  - hold metformin  - FSBS ACHS, SSI  - Hgb A1c 6.7    HypoMg2+, resolved.  - monitor.  ??  Essential hypertension   - Resume beta-blocker   - ACE-I noted on med rec but patient states she doesn't take  - continue to monitor vitals  ??  Morbid obesity with a BMI of 43 kg/m??  ??  Schizoaffective disorder, bipolar type  - On clozapine  - pharmacy reached out today to clarify that it is not required to have psych eval for resumption of home clozapine so no need to transfer to  [facility] for psych eval.  - continue trend cbc with diff to monitor neutrophil counts ??  History of CML  - On Gleevec    Anemia  - baseline Hgb around 10, currently 9.8  - no s/sx of bleeding  - monitor.    DVT prophylaxis:  enoxaparin  Anticipated disposition: To Home with Home Health vs SNF?  Estimated discharge:  tomorrow? 7/26    I have reviewed the patient's most recent labs and radiology studies.    Consults:  none     Subjective     Patient asleep but awakes to voice. She is very drowsy. Sleeps with her mouth open and snores loudly. No complaints. Patient is difficult to understand at times due to garbled speech    Objective     Vital signs in last 24 hours:  Temp:  [36.7 ??C (98.1 ??F)-36.9 ??C (98.4 ??F)] 36.9 ??C (98.4 ??F)  Heart Rate:  [94-101] 95  Resp:  [12-23] 16  BP: (122-142)/(75-97) 141/97  MAP (mmHg):  [90-111] 111  SpO2:  [93 %-96 %] 96 %  Oxygen Therapy     Date/Time Resp SpO2 O2 Device O2 Flow Rate (L/min)    07/26/20 0816 -- 96 % None (Room air) --    07/26/20 0330 16  93 % None (Room air) --           Physical Exam  Constitutional:       General: She is not in acute distress.     Appearance: She is obese.      Comments: Speech garbled   HENT:      Head: Normocephalic.   Cardiovascular:      Rate and Rhythm: Normal rate and regular rhythm.   Pulmonary:      Effort: Pulmonary effort is normal. No respiratory distress.      Breath sounds: No stridor.   Abdominal:      General: Bowel sounds are normal.      Palpations: Abdomen is soft.   Musculoskeletal:         General: Normal range of motion.      Cervical back: No tenderness.   Lymphadenopathy:      Cervical: No cervical adenopathy.   Neurological:      Mental Status: She is oriented to person, place, and time.

## 2020-07-26 NOTE — Unmapped (Addendum)
Kimberly Mathis is a  55 y.o.  female patient with medical history of schizoaffective disorder, bipolar type, CML on Gleevec, type 2 diabetes mellitus on metformin, essential hypertension, morbid obesity admitted to [facility] [place] on 07/23/20 with sepsis 2/2 acute pharyngitis. In the emergency room, she presented tachycardic up to 112 bpm but with a normal blood pressure and temperature 98.2. Work-up revealed a leukocytosis of 10.7 with 50% of neutrophil and 5.4 absolute neutrophil count, hemoglobin/hematocrit of 9.9/30.3 with MCV of 87.7, platelet of 295.  No significant electrolyte Imbalance and creatinine was 0.57 while blood glucose was 103 mg/dL.  TSH was 1.89, troponin 6 and proBNP 17.  Her lactate however was 2.5.  A Chest x-ray showed no acute infiltrate or consolidation but showed cardiomegaly without overt failure, CT of the neck/soft tissues revealed trace edema within the peritonsillar and parapharyngeal soft tissue compatible with pharyngitis.  No organized fluid collection or abscess.  Mildly prominent cervical lymph nodes likely reactive.      On admission, she was treated with IV Unasyn and vancomycin. Strep negative. She refused flu swab. Blood cultures x 2 are negative for growth at 72 hrs. WBC trended up to 14.7 but trending back down to 13.6. Procalcitonin on admit and repeated 7/25 were both negative. Her lactic acidosis resolved and she has been afebrile since admission. Persistent leukocytosis may also be related to her CML. She has clinically improved. Reports mild sore throat that has improved since admission. Denies any other complaints including abdominal pain, nausea, vomiting, fever, headache, nasal congestion, or difficult swallowing. Speech therapy consulted given concern for her dysarthria and drooling which was intermittent throughout the day. SLP evaluation showed no issues with swallowing, mastication, or concerns for aspiration. She ate 100% of meals and swallowing pills without difficulty. Today, patient is feeling well, felt to be at her baseline and is eager to go back to her skilled nursing facility. PT/OT evaluation recommends continued PT and OT at SNF.    She also most likely has underlying sleep apnea but doesn't have a diagnosis of it. Would consider outpatient sleep study.

## 2020-07-26 NOTE — Unmapped (Signed)
Patient has rested quietly in bed through the night. VSS. +sepsis with current tmt. Blood sugar checked ac/hs with SSI. Purewick in place for patient comfort. Pt voiding clear, yellow urine. UO within normal limits.  No SS DVT, VTE. Anti-coagulant measures maintained.  Safety precautions in place for falls prevention including bed alarm on at all times. Pt alert and oriented and agrees to call for assistance when OOB. IV antibiotics administered as ordered. Labs monitored.  Pt denies pain. Remote telemetry in place and being monitored.  Pt progressing toward discharge goals. Will continue to monitor.      Problem: Adult Inpatient Plan of Care  Goal: Plan of Care Review  Outcome: Progressing  Goal: Patient-Specific Goal (Individualized)  Outcome: Progressing  Goal: Absence of Hospital-Acquired Illness or Injury  Outcome: Progressing  Intervention: Identify and Manage Fall Risk  Recent Flowsheet Documentation  Taken 07/25/2020 1910 by Rueben Bash, RN  Safety Interventions:   bed alarm   fall reduction program maintained   low bed   nonskid shoes/slippers when out of bed   room near unit station  Intervention: Prevent Skin Injury  Recent Flowsheet Documentation  Taken 07/25/2020 1910 by Rueben Bash, RN  Skin Protection:   adhesive use limited   incontinence pads utilized  Intervention: Prevent and Manage VTE (Venous Thromboembolism) Risk  Recent Flowsheet Documentation  Taken 07/25/2020 2005 by Rueben Bash, RN  VTE Prevention/Management:   ambulation promoted   anticoagulant therapy   dorsiflexion/plantar flexion performed   fluids promoted  Taken 07/25/2020 1910 by Rueben Bash, RN  Activity Management: activity adjusted per tolerance  Goal: Optimal Comfort and Wellbeing  Outcome: Progressing  Goal: Readiness for Transition of Care  Outcome: Progressing  Goal: Rounds/Family Conference  Outcome: Progressing     Problem: Fall Injury Risk  Goal: Absence of Fall and Fall-Related Injury  Outcome: Progressing  Intervention: Identify and Manage Contributors  Recent Flowsheet Documentation  Taken 07/25/2020 2005 by Rueben Bash, RN  Self-Care Promotion:   independence encouraged   BADL personal objects within reach   BADL personal routines maintained  Intervention: Promote Injury-Free Environment  Recent Flowsheet Documentation  Taken 07/25/2020 1910 by Rueben Bash, RN  Safety Interventions:   bed alarm   fall reduction program maintained   low bed   nonskid shoes/slippers when out of bed   room near unit station     Problem: Skin Injury Risk Increased  Goal: Skin Health and Integrity  Outcome: Progressing  Intervention: Optimize Skin Protection  Recent Flowsheet Documentation  Taken 07/25/2020 1910 by Rueben Bash, RN  Pressure Reduction Techniques:   frequent weight shift encouraged   weight shift assistance provided  Pressure Reduction Devices: pressure-redistributing mattress utilized  Skin Protection:   adhesive use limited   incontinence pads utilized     Problem: Self-Care Deficit  Goal: Improved Ability to Complete Activities of Daily Living  Outcome: Progressing  Intervention: Promote Activity and Functional Independence  Recent Flowsheet Documentation  Taken 07/25/2020 2005 by Rueben Bash, RN  Self-Care Promotion:   independence encouraged   BADL personal objects within reach   BADL personal routines maintained

## 2020-07-26 NOTE — Unmapped (Signed)
CLINICAL PHARMACY CONSULT: VANCOMYCIN MONITORING   Kimberly Mathis is a 55 y.o. female treated with vancomycin for Bacteremia as follows:    Current Regimen: Vancomycin 1500 mg IV every 12 hours    Day of therapy: 2  Planned Duration: Unknown/To Be Determined  Vancomycin trough goal:12-17 mcg/mL    Interval History:     Temp (24hrs), Avg:36.7 ??C (98 ??F), Min:36.4 ??C (97.5 ??F), Max:36.9 ??C (98.4 ??F)     Recent Labs   Lab Units 07/25/20  1640 07/25/20  1011 07/25/20  0437 07/24/20  0426 07/24/20  0425 07/23/20  2238   WBC 10*9/L  --  14.3* 13.7* 10.1  --   --    BUN mg/dL  --   --   --   --  9 10   CREATININE mg/dL 1.61  --   --   --  0.96 0.57         Renal Function:  CrCl (estimated): Estimated Creatinine Clearance: 160.4 mL/min (based on SCr of 0.62 mg/dL). mL/min  CrCl capped at 95 mL/min based on patient age, per [facility] renal function capping protocol.    Renal function stable? (SCr has not fluctuated by >0.2 mg/dL in past 48 hours): Yes    Microbiology:  Positive Cultures: BCx NGTD    Concurrent antimicrobials:  Unasyn 07/23-    Recent Drug Levels --- This was ordered prior to 3rd Maintenance dose, since Patient is over 300 lbs/ BMI of 49.36, this is pre steady state (to ensure clearing appropriately and not accumulating). Will continue current dose, another trough drawn prior to 4th maintenance dose 07/26/2020 @ 0430am    Recent Labs   Lab Units 07/25/20  1640   VANCOMYCIN TR ug/mL 7.2*        Assessment/Plan  Based on renal function indices and recent drug levels: continue current regimen  Next vancomycin level will be obtained: 07/25 @ 0430am  Repeat SCr will be assessed: 07/25    Monika Salk Pharmacist

## 2020-07-26 NOTE — Unmapped (Signed)
CLINICAL PHARMACY CONSULT: VANCOMYCIN MONITORING   Kimberly Mathis is a 55 y.o. female treated with vancomycin for Bacteremia as follows:    Current Regimen: Vancomycin 1500 mg IV every 12 hours    Day of therapy: 2  Planned Duration: Unknown/To Be Determined  Vancomycin trough goal:12-17 mcg/mL    Interval History:     Temp (24hrs), Avg:36.7 ??C (98 ??F), Min:36.4 ??C (97.5 ??F), Max:36.9 ??C (98.4 ??F)     Recent Labs   Lab Units 07/25/20  1640 07/25/20  1011 07/25/20  0437 07/24/20  0426 07/24/20  0425 07/23/20  2238   WBC 10*9/L  --  14.3* 13.7* 10.1  --   --    BUN mg/dL  --   --   --   --  9 10   CREATININE mg/dL 4.54  --   --   --  0.98 0.57         Renal Function:  CrCl (estimated): Estimated Creatinine Clearance: 160.4 mL/min (based on SCr of 0.62 mg/dL). mL/min  CrCl capped at 95 mL/min based on patient age, per [facility] renal function capping protocol.    Renal function stable? (SCr has not fluctuated by >0.2 mg/dL in past 48 hours): Yes    Microbiology:  Positive Cultures: BCx NGTD  Microbiology Results (last day)       Procedure Component Value Date/Time Date/Time    RAPID INFLUENZA/RSV/COVID PCR [[id number]] Collected: 07/25/20 1006    Lab Status: No result Specimen: Nasopharyngeal Swab from Nasopharyngeal/Oropharyngeal     Blood Culture [[id number]] Collected: 07/23/20 2208    Lab Status: Preliminary result Specimen: Blood from 1 Peripheral Draw Updated: 07/24/20 2232     Blood Culture, Routine No Growth at 24 hours    Blood Culture [[id number]] Collected: 07/23/20 2209    Lab Status: Preliminary result Specimen: Blood from 1 Peripheral Draw Updated: 07/24/20 2232     Blood Culture, Routine No Growth at 24 hours             Concurrent antimicrobials:  Unasyn 07/23-    Recent Drug Levels --- (This was ordered prior to 3rd Maintenance dose, since Patient is over 300 lbs/ BMI of 49.36, this is pre steady state - to ensure clearing appropriately and not accumulating).   Recent Labs   Lab Units 07/25/20  1640 VANCOMYCIN TR ug/mL 7.2*        Assessment/Plan  Based on renal function indices and recent drug levels: Increase current dose to Vancomycin 1750 mg IV Q12H  Next vancomycin level will be obtained: 07/27/2020 @ 1630  Repeat SCr will be assessed: 07/26    Monika Salk Pharmacist

## 2020-07-26 NOTE — Unmapped (Signed)
OCCUPATIONAL THERAPY  Evaluation (07/26/20 0941)    Patient Name:  Kimberly Mathis       Medical Record Number: [id number]   Date of Birth: 08/03/1965  Sex: Female          OT Treatment Diagnosis:  Generalized weakness, cognitive deficits, and impaired ADLs    Assessment    Assessment: Pt is a 55 y.o. female who presented with sore throat and abdominal pain. PMH includes schizoaffective disorder, bipolar disorder, DM2, HTN, obesity, GERD, CML, and anemia. Pt is a questionable historian due to limited arousal level/command following this session. However, pt reports she was previously independent in feeding and grooming tasks. Requires assistance for bathing, dressing, and toileting. She reports performing standing transfers with a RW to a w/c. Prior to skilled OT instruction and intervention, pt required set up A for bedlevel grooming, min A for UB dressing, mod A for bathing, and max A for toileting and LB dressing. Pt will benefit from skilled OT in the acute setting and OT services on discharge at a frequency of 5x per week to maximize independence and safety with ADL/IADLs.       Today's Interventions: RN cleared pt for session. On arrival, pt in supine, asleep. RN present preparing medications. OT attempted to wake pt in supine with verbal and tactile cues - pt opening her eyes, shaking her head, and following commands to squeeze OT's hand but then would go back to sleep. OT attempted to raise [facility] and cued pt to attempt supine to sit transfer (in an attempt to improve arousal level) however pt vehemently refusing EOB/OOB activity this session. OT provided max encouragement as well as education on the importance of EOB/OOB mobility but pt continued to refuse. OT unable to determine the reasoning behind pt refusing due to pt going in and out of sleep. At one point pt just stating I don't feel well but she was unable to elaborate. RN notified. Pt incontinent of bladder in supine. Therefore, OT cued pt to perform log roll transfers (in preparation for toileting) to L and R side in order to complete peri care and change chux pads. Pt performed log roll transfers to either side with S and VCs. Lateral scooting towards the HOB in supine max A +2 (RN assisting). Total A for peri care this session. Following bed mobility and bed level toileting, pt with improved arousal level briefly, agreeable to bedlevel face washing. OT provided pt with a wet washcloth and pt washed her face in supine with set up A. Pt declined other grooming tasks. Pt left in supine with call bell in reach, bed alarm on, and all needs met. RN aware.    Activity Tolerance During Today's Session  Limited by fatigue    Plan  Planned Frequency of Treatment:  1-2x per day for: 3-5x week       Planned Interventions:  Adaptive equipment, ADL retraining, Balance activities, Bed mobility, Compensatory tech. training, Conservation, Education - Patient, Education - Family / caregiver, Endurance activities, Functional cognition, Functional mobility, Safety education, Positioning, Postular / Proximal stability, Therapeutic exercise, Transfer training, UE Strength / coordination exercise    Post-Discharge Occupational Therapy Recommendations:   5x weekly   OT DME Recommendations: Defer to post acute -        GOALS:   Patient and Family Goals: Pt did not state this session      Short Term:  Pt will perform a supine<>sit transfer with mod A in preparation for toileting transfers.  Time Frame : 2 weeks  Pt will perform a multistep grooming routine in the most functional position with set up A.   Time Frame : 2 weeks  Pt will complete UB dressing with min A.   Time Frame : 2 weeks       Prognosis:  Fair  Positive Indicators:  good caregiver support  Barriers to Discharge: Inability to safely perform ADLS, Endurance deficits, Cognitive deficits    Subjective  Prior Functional Status Pt is a questionable historian. She reports that she was previously independent in feeding and grooming tasks. She requires assistance for bathing, toileting, and UB/LB dressing. Reports performing standing transfers with the RW to the w/c however pt unable to report any further information regarding types of transfers or assist level.            Patient / Caregiver reports: I don't feel good Pt reports when OT asks what is limiting her from participating in EOB/OOB tasks. However pt unable to report specficially what is bothering her. Pt with garbled speech throughout this session - OT only able to make out bits and pieces of verbal communication.    Past Medical History:   Diagnosis Date   ??? GERD (gastroesophageal reflux disease)    ??? Gout    ??? HTN (hypertension)    ??? Morbid obesity (CMS-HCC)    ??? Osteoporosis    ??? Schizoaffective disorder, bipolar type (CMS-HCC)    ??? Type II diabetes mellitus (CMS-HCC)     Social History     Tobacco Use   ??? Smoking status: Former Smoker     Packs/day: 1.00     Types: Cigarettes   ??? Smokeless tobacco: Never Used   Substance Use Topics   ??? Alcohol use: Not Currently      No past surgical history on file. History reviewed. No pertinent family history.     Patient has no known allergies.     Objective Findings  Precautions / Restrictions  Falls precautions, Aspiration precautions    Weight Bearing  Non-applicable    Required Braces or Orthoses  Non-applicable    Communication Preference       Pain  Pt did not c/o pain    Equipment / Environment  Purewick/Condom catheter, Vascular access (PIV, TLC, Port-a-cath, PICC)    Living Situation  Living Environment: Facility  Lives With: Actuary available at home: [employer], [place]     Cognition   Orientation Level:  Oriented x 4   Arousal/Alertness:  Delayed responses to stimuli   Attention Span:  Difficulty attending to directions, Difficulty dividing attention   Memory:  Decreased short term memory   Following Commands:  Follows one step commands with increased time, Follows one step commands with repetition   Safety Judgment:  Unable to assess   Awareness of Errors:  Unable to assess   Problem Solving:  Unable to assess   Comments: Cognitive assessment limited this session due to arousal level (pt in and out of sleep/snoring throughout session). Able to follow simple one step commands to squeeze OT's hand for assessment and perform log roll transfers.    Vision / Hearing   Vision: Does not wear glasses     Hearing: No deficit identified   Hearing: Denies being HOH or wearing hearing aids     Hand Function:  Right Hand Function: Right hand function impaired  Right Hand Impairment: grip strength fair, ROM WFL  Left Hand Function: Left hand function impaired  Left Hand Impairment: grip strength fair, ROM WFL  Hand Function comments: Bilat hands grossly WFL for grasping wash cloth and washing face  Hand Dominance: Right    Skin Inspection:  Skin Inspection: Intact where visualized    ROM / Strength:  UE ROM/Strength: Left Impaired/Limited, Right Impaired/Limited  RUE Impairment: Reduced strength  LUE Impairment: Reduced strength  LE ROM/Strength: Left Impaired/Limited, Right Impaired/Limited  RLE Impairment: Reduced strength  LLE Impairment: Reduced strength    Coordination:  Coordination: WFL    Sensation:  RUE Sensation: RUE intact  LUE Sensation: LUE intact  RLE Sensation: RLE intact  LLE Sensation: LLE intact  Sensory/ Proprioception/ Stereognosis comments: Sensation grossly WFL for session    Balance:  Sitting and standing balance not assessed this session as pt refused EOB/OOB activity    Functional Mobility  Transfer Assistance Needed:  (Not assessed, pt refused EOB/OOB activity)  Bed Mobility Assistance Needed: Yes (log roll L and R)  Bed Mobility - Needs Assistance:  (Supervision)  Ambulation: Ambulation not assessed, pt refused EOB/OOB mobility      ADLs  ADLs: Needs assistance with ADLs  ADLs - Needs Assistance: Grooming, Bathing, Toileting, UB dressing, LB dressing  Grooming - Needs Assistance: Set Up Assist, Performed at Bed level  Bathing - Needs Assistance: Mod assist  Toileting - Needs Assistance: Max assist  UB Dressing - Needs Assistance: Min assist  LB Dressing - Needs Assistance: Max assist  IADLs: Anticipate total A for physically and cognitively demanding IADLs    AM-PAC-Daily Activity  Lower Body Dressing assistance needs: A lot - Maximum/Moderate Assistance  Bathing assistance needs: A lot - Maximum/Moderate Assistance  Toileting assistance needs: A lot - Maximum/Moderate Assistance  Upper Body Dressing assistance needs: A Little - Minimal/Contact Guard Assist/Supervision  Personal Grooming assistance needs: A Little - Minimal/Contact Guard Assist/Supervision  Eating Meals assistance needs: None - Modified Independent/Independent    Daily Activity Score:  Daily Activity Score: 16    Score (in points): % of Functional Impairment, Limitation, Restriction  6: 100% impaired, limited, restricted  7-8: At least 80%, but less than 100% impaired, limited restricted  9-13: At least 60%, but less than 80% impaired, limited restricted  14-19: At least 40%, but less than 60% impaired, limited restricted  20-22: At least 20%, but less than 40% impaired, limited restricted  23: At least 1%, but less than 20% impaired, limited restricted  24: 0% impaired, limited restricted      Vitals / Orthostatics  At Rest: NAD  With Activity: n/a  Orthostatics: n/a      Medical Staff Made Aware: RN Dency      Occupational Therapy Session Duration  OT Individual [mins]: 19         I attest that I have reviewed the above information.  Signed: Audelia Hives, OT  Filed 07/26/2020

## 2020-07-26 NOTE — Unmapped (Signed)
Problem: Adult Inpatient Plan of Care  Goal: Plan of Care Review  Outcome: Progressing  Goal: Patient-Specific Goal (Individualized)  Outcome: Progressing  Goal: Absence of Hospital-Acquired Illness or Injury  Outcome: Progressing  Intervention: Identify and Manage Fall Risk  Recent Flowsheet Documentation  Taken 07/26/2020 0700 by Noemi Chapel, RN  Safety Interventions:   bed alarm   low bed  Goal: Optimal Comfort and Wellbeing  Outcome: Progressing  Goal: Readiness for Transition of Care  Outcome: Progressing  Goal: Rounds/Family Conference  Outcome: Progressing     Problem: Fall Injury Risk  Goal: Absence of Fall and Fall-Related Injury  Outcome: Progressing  Intervention: Promote Scientist, clinical (histocompatibility and immunogenetics) Documentation  Taken 07/26/2020 0700 by Noemi Chapel, RN  Safety Interventions:   bed alarm   low bed   Vss, pt very drowsy and still has slured speech, MD aware, ST to see pt, sepsis screen monitoring.

## 2020-07-27 MED ORDER — AMOXICILLIN 875 MG-POTASSIUM CLAVULANATE 125 MG TABLET
Freq: Two times a day (BID) | ORAL | 0 refills | 0 days
Start: 2020-07-27 — End: 2020-08-02

## 2020-07-27 MED ADMIN — amoxicillin-clavulanate (AUGMENTIN) 875-125 mg per tablet 1 tablet: 1 | ORAL | @ 17:00:00 | Stop: 2020-07-27

## 2020-07-27 MED ADMIN — chlorhexidine (PERIDEX) 0.12 % solution 15 mL: 15 mL | OROMUCOSAL | @ 13:00:00 | Stop: 2020-07-27

## 2020-07-27 MED ADMIN — pravastatin (PRAVACHOL) tablet 20 mg: 20 mg | ORAL | @ 01:00:00

## 2020-07-27 MED ADMIN — multivitamins, therapeutic with minerals tablet 1 tablet: 1 | ORAL | @ 13:00:00 | Stop: 2020-07-27

## 2020-07-27 MED ADMIN — chlorhexidine (PERIDEX) 0.12 % solution 15 mL: 15 mL | OROMUCOSAL | @ 01:00:00

## 2020-07-27 MED ADMIN — ampicillin-sulbactam (UNASYN) 3 g in sodium chloride 0.9 % (NS) 100 mL IVPB connector bag: 3 g | INTRAVENOUS | @ 02:00:00 | Stop: 2020-07-29

## 2020-07-27 MED ADMIN — insulin lispro (HumaLOG) injection 0-15 Units: 0-15 [IU] | SUBCUTANEOUS | @ 13:00:00 | Stop: 2020-07-27

## 2020-07-27 MED ADMIN — cloZAPine (CLOZARIL) tablet 300 mg: 300 mg | ORAL | @ 01:00:00

## 2020-07-27 MED ADMIN — QUEtiapine (SEROquel) tablet 50 mg: 50 mg | ORAL | @ 01:00:00

## 2020-07-27 MED ADMIN — divalproex (DEPAKOTE) DR tablet 500 mg: 500 mg | ORAL | @ 13:00:00 | Stop: 2020-07-27

## 2020-07-27 MED ADMIN — imatinib (GLEEVEC) tablet 400 mg: 400 mg | ORAL | @ 13:00:00 | Stop: 2020-07-27

## 2020-07-27 MED ADMIN — divalproex (DEPAKOTE) DR tablet 500 mg: 500 mg | ORAL | @ 01:00:00

## 2020-07-27 MED ADMIN — enoxaparin (LOVENOX) syringe 40 mg: 40 mg | SUBCUTANEOUS | @ 13:00:00 | Stop: 2020-07-27

## 2020-07-27 MED ADMIN — vancomycin (VANCOCIN) 1750 mg in sodium chloride (NS) 0.9 % 500 mL IVPB (premix): 1750 mg | INTRAVENOUS | @ 10:00:00 | Stop: 2020-07-27

## 2020-07-27 MED ADMIN — divalproex (DEPAKOTE) DR tablet 500 mg: 500 mg | ORAL | @ 19:00:00 | Stop: 2020-07-27

## 2020-07-27 MED ADMIN — cloZAPine (CLOZARIL) tablet 150 mg: 150 mg | ORAL | @ 13:00:00 | Stop: 2020-07-27

## 2020-07-27 MED ADMIN — enoxaparin (LOVENOX) syringe 40 mg: 40 mg | SUBCUTANEOUS | @ 01:00:00

## 2020-07-27 MED ADMIN — allopurinoL (ZYLOPRIM) tablet 100 mg: 100 mg | ORAL | @ 13:00:00 | Stop: 2020-07-27

## 2020-07-27 MED ADMIN — senna (SENOKOT) tablet 1 tablet: 1 | ORAL | @ 01:00:00

## 2020-07-27 MED ADMIN — docusate sodium (COLACE) capsule 100 mg: 100 mg | ORAL | @ 01:00:00

## 2020-07-27 MED ADMIN — metoprolol tartrate (LOPRESSOR) tablet 25 mg: 25 mg | ORAL | @ 13:00:00 | Stop: 2020-07-27

## 2020-07-27 MED ADMIN — insulin lispro (HumaLOG) injection 0-15 Units: 0-15 [IU] | SUBCUTANEOUS | @ 01:00:00

## 2020-07-27 MED ADMIN — famotidine (PEPCID) tablet 20 mg: 20 mg | ORAL | @ 13:00:00 | Stop: 2020-07-27

## 2020-07-27 MED ADMIN — metoprolol tartrate (LOPRESSOR) tablet 25 mg: 25 mg | ORAL | @ 01:00:00

## 2020-07-27 MED ADMIN — ampicillin-sulbactam (UNASYN) 3 g in sodium chloride 0.9 % (NS) 100 mL IVPB connector bag: 3 g | INTRAVENOUS | @ 07:00:00 | Stop: 2020-07-27

## 2020-07-27 MED ADMIN — sertraline (ZOLOFT) tablet 50 mg: 50 mg | ORAL | @ 13:00:00 | Stop: 2020-07-27

## 2020-07-27 MED ADMIN — famotidine (PEPCID) tablet 20 mg: 20 mg | ORAL | @ 01:00:00

## 2020-07-27 MED ADMIN — docusate sodium (COLACE) capsule 100 mg: 100 mg | ORAL | @ 13:00:00 | Stop: 2020-07-27

## 2020-07-27 NOTE — Unmapped (Addendum)
ADDENDUM (07/26):    PHARMACY CONSULT SIGN-OFF:    Vancomycin has been discontinued. Pharmacy will sign-off.  Please let us know if we may be of further assistance.      Thank you for involving Pharmacy in the care of your patient.     Burna Sis, PharmD  ______________________________________________    CLINICAL PHARMACY CONSULT: VANCOMYCIN DOSING Follow-Up:    Nanako Stopher is a 55 y.o. female who continues on vancomycin for acute pharyngitis; leukocytosis. Today is Day #3 of IV vancomycin therapy.     Objective Data   Ht Readings from Last 1 Encounters:   03/04/20 171.5 cm (5' 7.5)      Wt Readings from Last 1 Encounters:   07/24/20 (!) 145.1 kg (319 lb 14.2 oz)       No Known Allergies    Actual body weight: ~145 kg    Temp (24hrs), Avg:36.7 ??C (98.1 ??F), Min:36.6 ??C (97.9 ??F), Max:36.9 ??C (98.4 ??F)     Recent Labs   Lab Units 07/27/20  0423 07/26/20  0359 07/25/20  1640 07/25/20  1011 07/25/20  0437 07/24/20  0426 07/24/20  0425 07/23/20  2238 07/23/20  2210   WBC 10*9/L 13.6* 14.7*  --  14.3* 13.7* 10.1  --   --  10.7*   BUN mg/dL  --   --   --   --   --   --  9 10  --    CREATININE mg/dL  --   --  9.81  --   --   --  0.55 0.57  --          Estimated Creatinine Clearance: 160.4 mL/min (based on SCr of 0.62 mg/dL).    Microbiology Results (last day)     Procedure Component Value Date/Time Date/Time    Blood Culture [[id number]] Collected: 07/23/20 2208    Lab Status: Preliminary result Specimen: Blood from 1 Peripheral Draw Updated: 07/26/20 2231     Blood Culture, Routine No Growth at 72 hours    Blood Culture [[id number]] Collected: 07/23/20 2209    Lab Status: Preliminary result Specimen: Blood from 1 Peripheral Draw Updated: 07/26/20 2231     Blood Culture, Routine No Growth at 72 hours          Concurrent antimicrobials:    Ampicillin/sulbactam [07/23-__]  Vancomycin IV pharmacy to dose [07/23-__]    Therapeutic Goals   Vancomycin trough: 12-17 mcg/mL    Assessment/Plan    Pharmacist care plan is as follows:    ?? Continue with vancomycin 1750 mg IV every 12 hours to achieve goal levels of 12-17 mcg/mL.   ?? Trough remains scheduled for 1730 this evening (07.26.2022) to assess clearance and evaluate current dosing schematic.   ?? Patient remains afebrile, WBC: 13.6, PCT < 0.04, and cultures NGTD X 72 hours--partnered with BMI of 49, would encourage de-escalation when clinically feasible by primary team. Will follow for DOT.   ?? Pharmacy will continue to monitor and adjust regimen as necessary.  Patient will be followed by pharmacy for changes in renal function, toxicity, and efficacy.    Thank you for involving Pharmacy in the care of your patient.     Burna Sis, PharmD

## 2020-07-27 NOTE — Unmapped (Signed)
Plan of care reviewed with patient. VSS on room air. Received last dose of IV antibiotics. Received first dose of antibiotics by mouth this shift, will continue after discharge. Tele and IV removed. Patient is preparing to discharge to SNF. Sepsis screen negative.

## 2020-07-27 NOTE — Unmapped (Signed)
[facility] [place]  Discharge Summary            Admit date:    07/23/2020  Discharge date:   07/27/2020    Discharge Service:   [facility]  Discharge Provider:   Noel Gerold, PA-C  Discharge Attending Physician: Jerilynn Birkenhead, MD, [facility]  Discharge to:    To Skilled Nursing Facility  Condition at Discharge:  fair  Code Status:    Full Code    Patient Care Team:  Desiree Lucy, [place] as PCP - General (Family Medicine)  Reino Kent, MD as Attending Provider (Psychiatry)  Leodis Liverpool as Nurse Practitioner (Hematology and Oncology)  Avie Arenas, MD as Consulting Physician (Medical Oncology)    Consults         none    Discharge Diagnoses     Principal Problem:    Acute pharyngitis POA: Yes  Active Problems:    Type II diabetes mellitus (CMS-HCC) POA: Yes    HTN (hypertension) POA: Yes    Gout POA: Yes    GERD (gastroesophageal reflux disease) POA: Yes    Morbid obesity (CMS-HCC) POA: Yes    Schizoaffective disorder, bipolar type (CMS-HCC) POA: Yes    CML (chronic myelocytic leukemia) (CMS-HCC) POA: Yes    Sore throat POA: Yes    Dyslipidemia POA: Yes  Resolved Problems:    Sepsis (CMS-HCC) POA: Unknown    Lactic acidemia POA: Unknown    Lactic acidosis POA: Unknown       Hospital Course     Kimberly Mathis is a  55 y.o.  female patient with medical history of schizoaffective disorder, bipolar type, CML on Gleevec, type 2 diabetes mellitus on metformin, essential hypertension, morbid obesity admitted to [facility] on 07/23/20 with sepsis 2/2 acute pharyngitis. In the emergency room, she presented tachycardic up to 112 bpm but with a normal blood pressure and temperature 98.2. Work-up revealed a leukocytosis of 10.7 with 50% of neutrophil and 5.4 absolute neutrophil count, hemoglobin/hematocrit of 9.9/30.3 with MCV of 87.7, platelet of 295.  No significant electrolyte Imbalance and creatinine was 0.57 while blood glucose was 103 mg/dL. TSH was 1.89, troponin 6 and proBNP 17.  Her lactate however was 2.5.  A Chest x-ray showed no acute infiltrate or consolidation but showed cardiomegaly without overt failure, CT of the neck/soft tissues revealed trace edema within the peritonsillar and parapharyngeal soft tissue compatible with pharyngitis.  No organized fluid collection or abscess.  Mildly prominent cervical lymph nodes likely reactive.      On admission, she was treated with IV Unasyn and vancomycin. Strep negative. She refused flu swab. Blood cultures x 2 are negative for growth at 72 hrs. WBC trended up to 14.7 but trending back down to 13.6. Procalcitonin on admit and repeated 7/25 were both negative. Her lactic acidosis resolved and she has been afebrile since admission. Persistent leukocytosis may also be related to her CML. She has clinically improved. Reports mild sore throat that has improved since admission. Denies any other complaints including abdominal pain, nausea, vomiting, fever, headache, nasal congestion, or difficult swallowing. Speech therapy consulted given concern for her dysarthria and drooling which was intermittent throughout the day. SLP evaluation showed no issues with swallowing, mastication, or concerns for aspiration. She ate 100% of meals and swallowing pills without difficulty. Today, patient is feeling well, felt to be at her baseline and is eager to go back to her skilled nursing facility. PT/OT evaluation  recommends continued PT and OT at SNF.    She also most likely has underlying sleep apnea but doesn't have a diagnosis of it. Would consider outpatient sleep study.       I spent greater than 30 mins in the discharge of this patient. On the day of discharge I had a face to face encounter with the patient, all questions answered.     Discharge Physical Exam  GEN: Alert, O x 2. Dysarthria makes history difficult to obtain/understand. Somnolent at first but once awakened was very talkative and alert.  HEENT: drooling intermittently, clear saliva. Posterior oropharynx unable to be full visualized on exam but no gross abnormalities. Mild TTP left anterior cervical region. No masses or fluctuance felt.   CV: RRR, no murmurs, no edema  RESP: Clear bilaterally  ABD: soft, obese, non tender, no guarding or rebound    Procedures     None    Discharge Medications        Your Medication List      START taking these medications    amoxicillin-clavulanate 875-125 mg per tablet  Commonly known as: AUGMENTIN  Take 1 tablet by mouth every twelve (12) hours for 6 days.        CONTINUE taking these medications    acetaminophen 325 MG tablet  Commonly known as: TYLENOL  acetaminophen 325 mg tablet   TAKE 3 TABLETS BY MOUTH TWICE DAILY.     allopurinoL 100 MG tablet  Commonly known as: ZYLOPRIM  Take 100 mg by mouth daily.     BIOTENE MOISTURIZING MOUTH Spry  Generic drug: saliva stimulant comb. no.3  1 spray every two (2) hours as needed.     bisacodyL 5 mg EC tablet  Commonly known as: DULCOLAX  Take 10 mg by mouth daily as needed for constipation.     cloZAPine 100 MG tablet  Commonly known as: CLOZARIL  Take 150 mg by mouth in the morning. 150 mg in the AM and 300 mg at bedtime.     diclofenac sodium 1 % gel  Commonly known as: VOLTAREN     diphenoxylate-atropine 2.5-0.025 mg per tablet  Commonly known as: LOMOTIL  Take 1 tablet by mouth four (4) times a day as needed for diarrhea.     famotidine 20 MG tablet  Commonly known as: PEPCID  Take 20 mg by mouth Two (2) times a day.     imatinib 400 MG tablet  Commonly known as: GLEEVEC  Take 1 tablet (400 mg total) by mouth daily.     lidocaine 5 % patch  Commonly known as: LIDODERM  Place 1 patch on the skin daily. Apply to affected area for 12 hours only each day (then remove patch)     metFORMIN 1000 MG tablet  Commonly known as: GLUCOPHAGE  Take 1,000 mg by mouth daily before breakfast. 1000 mg in the AM and 500 mg at bedtime     metoprolol tartrate 25 MG tablet  Commonly known as: LOPRESSOR  Take 25 mg by mouth Two (2) times a day.     multivitamin per tablet  Commonly known as: TAB-A-VITE/THERAGRAN  Take 1 capsule by mouth daily.     ondansetron 4 MG tablet  Commonly known as: ZOFRAN  Take 4 mg by mouth every eight (8) hours as needed for nausea.     pravastatin 20 MG tablet  Commonly known as: PRAVACHOL  Take 20 mg by mouth at bedtime.     QUEtiapine 50 MG  tablet  Commonly known as: SEROquel  Take 50 mg by mouth nightly.     sertraline 50 MG tablet  Commonly known as: ZOLOFT  Take 50 mg by mouth in the morning.     valproic acid 250 mg capsule  Commonly known as: DEPAKENE  Take 500 mg by mouth Three (3) times a day.            Lab Results      BLOOD  Recent Labs   Lab Units 07/27/20  0423 07/26/20  0359 07/25/20  1011 07/25/20  0437 07/24/20  0426 07/23/20  2210   WBC 10*9/L 13.6* 14.7* 14.3* 13.7* 10.1 10.7*   HEMOGLOBIN g/dL 9.8* 9.8* 9.8* 9.5* 9.6* 9.9*   HEMATOCRIT % 30.9* 30.9* 30.5* 29.4* 29.6* 30.3*   PLATELET COUNT (1) 10*9/L 306 316 303 311 289 295     Recent Labs   Lab Units 07/25/20  1640 07/25/20  1210 07/25/20  0437 07/24/20  1508 07/24/20  0849 07/24/20  1610 07/24/20  0426 07/24/20  0425 07/24/20  0029 07/23/20  2238   SODIUM mmol/L  --   --   --   --   --   --   --  142  --  144   POTASSIUM mmol/L  --   --   --   --   --   --   --  3.9  --  4.4   CHLORIDE mmol/L  --   --   --   --   --   --   --  103  --  105   CO2 mmol/L  --   --   --   --   --   --   --  27.4  --  32.0*   BUN mg/dL  --   --   --   --   --   --   --  9  --  10   CREATININE mg/dL 9.60  --   --   --   --   --   --  0.55  --  0.57   GLUCOSE mg/dL  --   --   --   --   --   --   --  193*  --  103   CALCIUM mg/dL  --   --   --   --   --   --   --  8.6*  --  8.4*   ALBUMIN g/dL  --   --   --   --   --   --   --  3.7  --  3.6   PROTEIN TOTAL g/dL  --   --   --   --   --   --   --  7.7  --  7.4   BILIRUBIN TOTAL mg/dL  --   --   --   --   --   --   --  0.2*  --  0.2*   AST U/L  --   --   --   --   --   --   --  9* --  19   ALT U/L  --   --   --   --   --   --   --  14  --  12   ALK PHOS U/L  --   --   --   --   --   --   --  76  --  58   MAGNESIUM mg/dL  --   --  1.9  --   --   --   --  1.5*  --   --    PHOSPHORUS mg/dL  --   --   --   --   --   --   --  1.8*  --   --    LACTATE mmol/L  --  2.1 3.1* 4.0* 4.4* 4.9* 5.4*  --  2.5*  --      Recent Labs   Lab Units 07/24/20  0426 07/24/20  0207 07/23/20  2238   HSTNI ng/L  --   --  6   PRO-BNP pg/mL  --  16.0 17.0   CHOLESTEROL mg/dL 161  --   --    LDL CALC mg/dL 70  --   --    HDL mg/dL 37*  --   --    TRIGLYCERIDES mg/dL 096  --   --      Recent Labs   Lab Units 07/24/20  0425   INR  0.90*     Recent Labs   Lab Units 07/24/20  0207 07/23/20  2238 07/23/20  2210   TSH uIU/mL 1.740 1.894  --    HEMOGLOBIN A1C %  --   --  6.7*    URINE  Recent Labs   Lab Units 07/24/20  1107   WBC UA /HPF <1   NITRITE UA  Negative   LEUKOCYTES UA  Negative   BACTERIA UA /HPF None Seen   RBC UA /HPF <1   BLOOD UA  Negative   GLUCOSE UA  4+*   PROTEIN UA  Negative   KETONES UA  1+*      ABG  Recent Labs     07/24/20  1850   PHART 7.34*   PCO2ART 51.2*   PO2ART 73.4*   BEART 1.7       Microbiology Results (last day)     Procedure Component Value Date/Time Date/Time    RAPID INFLUENZA/RSV/COVID PCR [[id number]]     Lab Status: No result Specimen: Nasopharyngeal Swab Patient refused    Blood Culture [[id number]] Collected: 07/23/20 2208    Lab Status: Preliminary result Specimen: Blood from 1 Peripheral Draw Updated: 07/26/20 2231     Blood Culture, Routine No Growth at 72 hours    Blood Culture [[id number]] Collected: 07/23/20 2209    Lab Status: Preliminary result Specimen: Blood from 1 Peripheral Draw Updated: 07/26/20 2231     Blood Culture, Routine No Growth at 72 hours          Imaging     ECG 12 Lead    Result Date: 07/24/2020  SINUS TACHYCARDIA POSSIBLE LEFT ATRIAL ENLARGEMENT SEPTAL INFARCT  , AGE UNDETERMINED ABNORMAL ECG WHEN COMPARED WITH ECG OF 05-Oct-2002 17:07, NONSPECIFIC T WAVE ABNORMALITY NOW EVIDENT IN LATERAL LEADS QT HAS LENGTHENED Confirmed by Rice, Erika ([id number]) on 07/24/2020 12:47:17 AM    XR Chest 2 views    Result Date: 07/23/2020  Exam:  Chest Two Views History:  Chest pain Technique:  2 views Comparison:  04/01/2019 Findings:  Lung volumes are diminished and there is associated crowding of bronchovascular markings. Mildly elevated right diaphragm. No airspace consolidation or pleural effusion. Cardiac silhouette is borderline enlarged. Degenerative marginal spurring noted throughout the thoracic spine.     Borderline enlarged cardiac silhouette, no evidence for overt failure. Elevated right diaphragm. Diminished lung volumes. Signed (Electronic Signature): 07/23/2020 11:43  PM Signed By: Rosalee Kaufman, MD    CT Neck Soft Tissue W Contrast    Result Date: 07/23/2020  Exam:  CT Neck Soft Tissue with Contrast History:  Sore throat Technique: Routine soft tissue neck CT with IV contrast. AEC (automated exposure control) and/or manual techniques such as size-specific kV and mAs are employed where appropriate to reduce radiation exposure for all CT exams. Comparison:  None. Findings: VISIBLE PARANASAL SINUSES, MASTOID AIR CELLS, AND MIDDLE EARS:  Normal. POSTERIOR NASOPHARYNX, TONSILLAR PILLARS, TONGUE BASE, AND PIRIFORM SINUSES:  Trace edema within the peritonsillar and parapharyngeal soft tissues. No organized collection or abscess. FLOOR OF MOUTH:  Normal. RETROPHARYNGEAL AND PARAPHARYNGEAL SPACES:  No significant edema within the midline retropharyngeal soft tissues. There is no midline retropharyngeal abscess. MAJOR SALIVARY GLANDS:  Normal. SUPERFICIAL SOFT TISSUES:  Unremarkable. EPIGLOTTIS AND ARYEPIGLOTTIC FOLDS:  Normal LARYNGEAL APPARATUS:  Normal. LYMPH NODES:  Mildly prominent cervical lymph nodes measure up to 10 mm short axis diameter. No suppurative nodes identified. THYROID GLAND:  Normal. MAJOR ARTERIES AND VEINS OF THE NECK:  Unremarkable. LUNG APICES:  Unremarkable. CERVICAL SPINE:  Anterior marginal spurring in the lower cervical spine. No significant bony central canal stenosis.     1.    Trace edema within the peritonsillar and parapharyngeal soft tissues compatible with pharyngitis. No organized fluid collection or abscess. 2.    Mildly prominent cervical lymph nodes, likely reactive. Signed (Electronic Signature): 07/23/2020 11:33 PM Signed By: Rosalee Kaufman, MD    Discharge Instructions     Diet Instructions     Discharge diet (specify)      Discharge Nutrition Therapy: Consistent Carb    Consistent Carb Level: Consistent Carb 60/60/60 (4/4/4)        Activity Instructions     Activity as tolerated          Follow Up instructions and Outpatient Referrals     Call MD for:      Call MD for:  severe uncontrolled pain      Call MD for: Temperature > 38.5 Celsius ( > 101.3 Fahrenheit)      Discharge instructions      Discharge instructions        -Complete full course of prescribed Augmentin for pharyngitis. Tylenol for fever or mild pain.      -Resume all other previously prescribed home medications. No changes made. Resume previous diet and activity level.      - Follow up with PCP at [facility] in [place] in 1 week.     Appointments which have been scheduled for you    Oct 07, 2020 11:30 AM  (Arrive by 11:00 AM)  LAB ONLY [place] with ADULT ONC LAB  [facility] ADULT ONCOLOGY LAB DRAW STATION [facility]) [address]  [place] [address]  [phone]        Oct 07, 2020 12:30 PM  (Arrive by 12:00 PM)  RETURN ACTIVE [place] with Virgil Benedict, [place]  [place] [facility]) [address]  [place] [address]  [phone]

## 2020-07-27 NOTE — Unmapped (Addendum)
Speech Language Pathology Clinical Swallow Assessment  Evaluation (07/26/20 1400)    Patient Name:  Kimberly Mathis       Medical Record Number: [id number]   Date of Birth: [birth year]  Sex: Female            SLP Treatment Diagnosis: dysphagia  Activity Tolerance: Patient limited by fatigue    Assessment    Swallowing:  Patient presents with functional oropharyngeal swallowing mechanism. Clinical swallow evaluation was unremarkable with no s/s aspiration observed. Mastication was adequate with solid foods. Drooling observed pre-prandially although no anterior spillage was observed with PO intake. Risk factors for aspiration PNA include GERD and reduced mobility. Other contributing factors include lethargy. FEES/MBS not indicated.     Cognitive-Communication Screen:  Patient with severe intellectual disability at baseline per chart which impacts cognitive-communication skills. Patient inconsistently answered questions and followed simple commands. Speech is dysarthric c/b imprecise articulation which impacts intelligibility. Patient with open mouth posture and interdental lingual positioning at rest which results in drooling. Performance at this time including speech intelligibility may be further impacted by significant lethargy. Insight into deficits appears to be reduced. Due to suspected baseline deficits as well as non-stimulability for strategies for improved speech intelligibility, ST services are not indicated at this time.        Risk for Aspiration: Mild     Recommendations:         Diet Liquids Recommendations: No Restrictions    Diet Solids Recommendation: No Restrictions    Recommended Form of Medications: Whole, With liquid, With puree      Compensatory Swallowing Strategies: Upright as possible for all oral intake, Remain upright for 20-30 minutes after meals, Small bites/sips, Eat/feed slowly, Other (Comment) (Eat/drink only when awake and alert)    Post Acute Discharge Recommendations  Post Acute SLP Discharge Recommendations: SLP services not indicated    Prognosis: Good  Positive Indicators: PLOF  Barriers to Discharge: Inability to safely perform ADLS, Endurance deficits, Cognitive deficits, Poor insight into deficits     Plan of Care  SLP Follow-up / Frequency: D/C Services, D/C Services       Patient and Family Goal: presumably to eat and drink safely    Subjective  Current Functional Status: 55 y/o female admitted on 07/23/20 with sepsis. PMH includes schizoaffective disorder, bipolar disorder, severe intellectual disability, DM2, HTN, obesity, GERD, CML, and anemia. She is currently receiving a regular/thin liquid diet, referred for ST evaluation for dysphagia and speech/language/cognition.     Prior Function: Required physical assistance from others, Required assistance to manage medications, Required assistance to manage finances, Did not drive prior to admission   Lives With: Care Staff        Communication Preference: Verbal  Patient/Caregiver Reports: Patient provided limited report and is not reliable historian. She reports she is tired and wants to sleep.     Allergies: Patient has no known allergies.  Current Facility-Administered Medications   Medication Dose Route Frequency Provider Last Rate Last Admin   ??? acetaminophen (TYLENOL) tablet 650 mg  650 mg Oral Q4H PRN Burman Freestone, MD       ??? allopurinoL (ZYLOPRIM) tablet 100 mg  100 mg Oral Daily Burman Freestone, MD   100 mg at 07/26/20 0932   ??? ampicillin-sulbactam (UNASYN) 3 g in sodium chloride 0.9 % (NS) 100 mL IVPB connector bag  3 g Intravenous Q6H Burman Freestone, MD   Stopped at 07/26/20 1603   ??? chlorhexidine (PERIDEX) 0.12 % solution 15 mL  15  mL Mouth BID Burman Freestone, MD   15 mL at 07/26/20 0932   ??? cloZAPine (CLOZARIL) tablet 150 mg  150 mg Oral Daily Burman Freestone, MD   150 mg at 07/26/20 0955   ??? cloZAPine (CLOZARIL) tablet 300 mg  300 mg Oral Nightly Leo Rod, PA   300 mg at 07/25/20 2015   ??? dextrose (GLUTOSE) 40 % gel 15 g of dextrose  15 g of dextrose Oral Q15 Min PRN Burman Freestone, MD       ??? dextrose 50 % in water (D50W) 50 % solution 12.5 g  12.5 g Intravenous Q15 Min PRN Burman Freestone, MD       ??? divalproex (DEPAKOTE) DR tablet 500 mg  500 mg Oral TID Burman Freestone, MD   500 mg at 07/26/20 1510   ??? docusate sodium (COLACE) capsule 100 mg  100 mg Oral BID Burman Freestone, MD   100 mg at 07/26/20 0933   ??? enoxaparin (LOVENOX) syringe 40 mg  40 mg Subcutaneous Q12H Burman Freestone, MD   40 mg at 07/26/20 0955   ??? famotidine (PEPCID) tablet 20 mg  20 mg Oral BID Burman Freestone, MD   20 mg at 07/26/20 0932   ??? glucagon injection 1 mg  1 mg Intramuscular Once PRN Burman Freestone, MD       ??? imatinib (GLEEVEC) tablet 400 mg  400 mg Oral Daily Burman Freestone, MD   400 mg at 07/26/20 0953   ??? insulin lispro (HumaLOG) injection 0-15 Units  0-15 Units Subcutaneous ACHS Burman Freestone, MD   1 Units at 07/25/20 2053   ??? ipratropium-albuteroL (DUO-NEB) 0.5-2.5 mg/3 mL nebulizer solution 3 mL  3 mL Nebulization Q6H PRN Burman Freestone, MD       ??? ketorolac (TORADOL) injection 15 mg  15 mg Intravenous Q6H PRN Burman Freestone, MD       ??? lidocaine (LIDODERM) 5 % patch 1 patch  1 patch Transdermal Q24H Burman Freestone, MD   1 patch at 07/26/20 0934   ??? metoprolol tartrate (LOPRESSOR) tablet 25 mg  25 mg Oral BID Leo Rod, PA   25 mg at 07/26/20 0932   ??? MORPhine injection 1 mg  1 mg Intravenous Q2H PRN Burman Freestone, MD        Or   ??? MORPhine injection 2 mg  2 mg Intravenous Q2H PRN Burman Freestone, MD       ??? multivitamins, therapeutic with minerals tablet 1 tablet  1 tablet Oral Daily Burman Freestone, MD   1 tablet at 07/26/20 0932   ??? naloxone (NARCAN) injection 0.1 mg  0.1 mg Intravenous Q5 Min PRN Burman Freestone, MD       ??? ondansetron (ZOFRAN-ODT) disintegrating tablet 4 mg  4 mg Oral Q6H PRN Burman Freestone, MD        Or   ??? ondansetron (ZOFRAN) injection 4 mg  4 mg Intravenous Q8H PRN Burman Freestone, MD       ??? pravastatin (PRAVACHOL) tablet 20 mg  20 mg Oral Nightly Burman Freestone, MD   20 mg at 07/25/20 2012   ??? promethazine (PHENERGAN) injection 6.25 mg  6.25 mg Intravenous Q6H PRN Burman Freestone, MD        Or   ??? promethazine (PHENERGAN) injection 6.25 mg  6.25 mg Intramuscular Q6H PRN Burman Freestone, MD       ??? QUEtiapine (SEROquel) tablet 50 mg  50 mg Oral Nightly Burman Freestone, MD   50 mg at 07/25/20  2012   ??? sertraline (ZOLOFT) tablet 50 mg  50 mg Oral Daily Burman Freestone, MD   50 mg at 07/26/20 0933   ??? vancomycin (VANCOCIN) 1750 mg in sodium chloride (NS) 0.9 % 500 mL IVPB (premix)  1,750 mg Intravenous Q12H Leo Rod, PA   Stopped at 07/26/20 0654     Past Medical History:   Diagnosis Date   ??? GERD (gastroesophageal reflux disease)    ??? Gout    ??? HTN (hypertension)    ??? Morbid obesity (CMS-HCC)    ??? Osteoporosis    ??? Schizoaffective disorder, bipolar type (CMS-HCC)    ??? Type II diabetes mellitus (CMS-HCC)      History reviewed. No pertinent family history.  No past surgical history on file.  Social History     Tobacco Use   ??? Smoking status: Former Smoker     Packs/day: 1.00     Types: Cigarettes   ??? Smokeless tobacco: Never Used   Substance Use Topics   ??? Alcohol use: Not Currently         General:  Hearing Exceptions: None                 Vision: Functional for self-feeding                   Medical Tests / Procedures Comments: CXR 7/22: IMPRESSION:     Borderline enlarged cardiac silhouette, no evidence for overt failure.  Elevated right diaphragm. Diminished lung volumes.  Equipment/Environment: Patient not wearing mask for full session, Vascular access (PIV, TLC, Port-a-cath, PICC  Pain: none  Precautions: falls     Objective  Temperature Spikes Noted: No  Respiratory Status : Room air  History of Intubation: No  Behavior/Cognition: Lethargic  Positioning : Upright in bed    Oral / Motor Exam: Reduced labial, lingual, and facial strength bilaterally. Unable to fully assess ROM due to patient not following directions.   Labial Strength: Reduced   Lingual Strength: Reduced   Facial Strength: Reduced  Apraxia: None present   Dysarthria: Moderate Dysarthria   Intelligibility: Intelligibility reduced   Breath Support: Adequate for speech   Dentition: Adequate    Clinical Swallow Assessment    Sitting posture: upright and in bed  Ability to self-feed: was provided assist to feed  Consistencies Tested: thin liquid, pureed and solid    Oral Phase: adequate mastication; drooling noted  Signs/Symptoms of Aspiration: none  Esophageal Phase (suspected): unremarkable  Aspiration risk appears: mild    The [facility] Swallow Protocol:   Criteria met (alert, consuming a regular diet at baseline, no presence of a feeding tube, no   positioning restrictions, no tracheostomy tube present, not NPO by physician order).   Test was: passed     Medical Staff Made Aware: RN    Speech Therapy Session Duration  SLP Individual [mins]: 25    I attest that I have reviewed the above information.  Signed: Pershing Proud, SLP    Ceasar Mons 07/26/2020

## 2020-07-27 NOTE — Unmapped (Signed)
Problem: Adult Inpatient Plan of Care  Goal: Absence of Hospital-Acquired Illness or Injury  Intervention: Identify and Manage Fall Risk  Recent Flowsheet Documentation  Taken 07/27/2020 0511 by [place] M Black, RN  Safety Interventions:   bed alarm   fall reduction program maintained   lighting adjusted for tasks/safety   low bed  Taken 07/27/2020 0306 by [place] M Black, RN  Safety Interventions:   bed alarm   fall reduction program maintained   low bed   lighting adjusted for tasks/safety  Taken 07/27/2020 0101 by [place] M Black, RN  Safety Interventions:   bed alarm   fall reduction program maintained   low bed   lighting adjusted for tasks/safety  Taken 07/26/2020 2333 by [place] M Black, RN  Safety Interventions:   bed alarm   fall reduction program maintained   low bed   lighting adjusted for tasks/safety  Taken 07/26/2020 2102 by [place] M Black, RN  Safety Interventions:   bed alarm   fall reduction program maintained   lighting adjusted for tasks/safety   low bed  Taken 07/26/2020 1908 by [place] M Black, RN  Safety Interventions:   bed alarm   fall reduction program maintained   lighting adjusted for tasks/safety   low bed  Intervention: Prevent Skin Injury  Recent Flowsheet Documentation  Taken 07/27/2020 0101 by [place] M Black, RN  Skin Protection:   adhesive use limited   incontinence pads utilized  Taken 07/26/2020 1908 by [place] M Black, RN  Skin Protection:   adhesive use limited   incontinence pads utilized  Intervention: Prevent and Manage VTE (Venous Thromboembolism) Risk  Recent Flowsheet Documentation  Taken 07/27/2020 0511 by [place] M Black, RN  Activity Management: activity adjusted per tolerance  Taken 07/27/2020 0306 by [place] M Black, RN  Activity Management: activity adjusted per tolerance  Taken 07/27/2020 0101 by [place] M Black, RN  Activity Management: activity adjusted per tolerance  Taken 07/26/2020 2333 by [place] M Black, RN  Activity Management: activity adjusted per tolerance  Taken 07/26/2020 2102 by [place] M Black, RN  Activity Management: activity adjusted per tolerance  Taken 07/26/2020 1908 by [place] M Black, RN  Activity Management: activity adjusted per tolerance     Problem: Fall Injury Risk  Goal: Absence of Fall and Fall-Related Injury  Intervention: Promote Injury-Free Environment  Recent Flowsheet Documentation  Taken 07/27/2020 8119 by [place] M Black, RN  Safety Interventions:   bed alarm   fall reduction program maintained   lighting adjusted for tasks/safety   low bed  Taken 07/27/2020 0306 by [place] M Black, RN  Safety Interventions:   bed alarm   fall reduction program maintained   low bed   lighting adjusted for tasks/safety  Taken 07/27/2020 0101 by [place] M Black, RN  Safety Interventions:   bed alarm   fall reduction program maintained   low bed   lighting adjusted for tasks/safety  Taken 07/26/2020 2333 by [place] M Black, RN  Safety Interventions:   bed alarm   fall reduction program maintained   low bed   lighting adjusted for tasks/safety  Taken 07/26/2020 2102 by [place] M Black, RN  Safety Interventions:   bed alarm   fall reduction program maintained   lighting adjusted for tasks/safety   low bed  Taken 07/26/2020 1908 by [place] M Black, RN  Safety Interventions:   bed alarm   fall reduction program maintained   lighting adjusted for tasks/safety   low bed  Problem: Skin Injury Risk Increased  Goal: Skin Health and Integrity  Intervention: Optimize Skin Protection  Recent Flowsheet Documentation  Taken 07/27/2020 0511 by [place] M Black, RN  Pressure Reduction Techniques:   frequent weight shift encouraged   heels elevated off bed  Head of Bed [facility]) Positioning: HOB at 30 degrees  Pressure Reduction Devices: pressure-redistributing mattress utilized  Taken 07/27/2020 0306 by [place] M Black, RN  Pressure Reduction Techniques:   heels elevated off bed   frequent weight shift encouraged  Head of Bed (HOB) Positioning: HOB at 30 degrees  Pressure Reduction Devices: pressure-redistributing mattress utilized  Taken 07/27/2020 0101 by [place] M Black, RN  Pressure Reduction Techniques:   frequent weight shift encouraged   heels elevated off bed  Head of Bed (HOB) Positioning: HOB at 30 degrees  Pressure Reduction Devices: pressure-redistributing mattress utilized  Skin Protection:   adhesive use limited   incontinence pads utilized  Taken 07/26/2020 2333 by [place] M Black, RN  Pressure Reduction Techniques:   frequent weight shift encouraged   heels elevated off bed  Head of Bed (HOB) Positioning: HOB at 30 degrees  Pressure Reduction Devices: pressure-redistributing mattress utilized  Taken 07/26/2020 2102 by [place] M Black, RN  Pressure Reduction Techniques:   frequent weight shift encouraged   heels elevated off bed  Head of Bed (HOB) Positioning: HOB at 30-45 degrees  Pressure Reduction Devices: pressure-redistributing mattress utilized  Taken 07/26/2020 1908 by [place] M Black, RN  Pressure Reduction Techniques:   frequent weight shift encouraged   heels elevated off bed  Head of Bed (HOB) Positioning: HOB at 30-45 degrees  Pressure Reduction Devices: pressure-redistributing mattress utilized  Skin Protection:   adhesive use limited   incontinence pads utilized

## 2020-08-16 NOTE — Unmapped (Signed)
[facility] Refill Coordination Note    Specialty Medication(s) to be Shipped:   Hematology/Oncology: Imatinib    Other medication(s) to be shipped: No additional medications requested for fill at this time     Kimberly Mathis, DOB: Feb 14, 1965  Phone: There are no phone numbers on file.      All above HIPAA information was verified with patient's caregiver, Gene     Was a translator used for this call? No    Completed refill call assessment today to schedule patient's medication shipment from the [facility] [phone]).  All relevant notes have been reviewed.     Specialty medication(s) and dose(s) confirmed: Regimen is correct and unchanged.   Changes to medications: Blythe reports no changes at this time.  Changes to insurance: No  New side effects reported not previously addressed with a pharmacist or physician: None reported  Questions for the pharmacist: No    Confirmed patient received a Conservation officer, historic buildings and a Surveyor, mining with first shipment. The patient will receive a drug information handout for each medication shipped and additional FDA Medication Guides as required.       DISEASE/MEDICATION-SPECIFIC INFORMATION        N/A    SPECIALTY MEDICATION ADHERENCE     Medication Adherence    Patient reported X missed doses in the last month: 4  Specialty Medication: Imatinib 400 mg  Patient is on additional specialty medications: No  Informant: caregiver  Support network for adherence: home health agency              Were doses missed due to medication being on hold? No    Imatinib 400 mg: 11 days of medicine on hand       REFERRAL TO PHARMACIST     Referral to the pharmacist: Not needed      [facility]     Shipping address confirmed in Epic.     Delivery Scheduled: Yes, Expected medication delivery date: 08/24/20.     Medication will be delivered via Next Day Courier to the prescription address in Epic [place].    [name]   [facility] Specialty Technician

## 2020-08-23 MED FILL — IMATINIB 400 MG TABLET: ORAL | 30 days supply | Qty: 30 | Fill #9

## 2020-09-14 DIAGNOSIS — C9210 Chronic myeloid leukemia, BCR/ABL-positive, not having achieved remission: Principal | ICD-10-CM

## 2020-09-14 MED ORDER — IMATINIB 400 MG TABLET
ORAL_TABLET | Freq: Every day | ORAL | 11 refills | 30 days | Status: CP
Start: 2020-09-14 — End: 2021-09-14
  Filled 2020-09-17: qty 30, 30d supply, fill #0

## 2020-09-14 NOTE — Unmapped (Signed)
[facility] Refill Coordination Note    Specialty Medication(s) to be Shipped:   Hematology/Oncology: Imatinib    Other medication(s) to be shipped: No additional medications requested for fill at this time     Kimberly Mathis, DOB: [birth year]  Phone: There are no phone numbers on file.      All above HIPAA information was verified with patient's caregiver, Kimberly Mathis     Was a translator used for this call? No    Completed refill call assessment today to schedule patient's medication shipment from the [facility] [phone]).  All relevant notes have been reviewed.     Specialty medication(s) and dose(s) confirmed: Regimen is correct and unchanged.   Changes to medications: Kimberly Mathis reports no changes at this time.  Changes to insurance: No  New side effects reported not previously addressed with a pharmacist or physician: None reported  Questions for the pharmacist: No    Confirmed patient received a Conservation officer, historic buildings and a Surveyor, mining with first shipment. The patient will receive a drug information handout for each medication shipped and additional FDA Medication Guides as required.       DISEASE/MEDICATION-SPECIFIC INFORMATION        N/A    SPECIALTY MEDICATION ADHERENCE     Medication Adherence    Patient reported X missed doses in the last month: 7  Specialty Medication: Imatinib 400 mg  Patient is on additional specialty medications: No  Informant: caregiver  Support network for adherence: home health agency              Were doses missed due to medication being on hold? No    Imatinib 400 mg: 7 days of medicine on hand       REFERRAL TO PHARMACIST     Referral to the pharmacist: Not needed      [facility]     Shipping address confirmed in Epic.     Delivery Scheduled: Yes, Expected medication delivery date: 09/17/20.  However, Rx request for refills was sent to the provider as there are none remaining.     Medication will be delivered via Same Day Courier to the prescription address in [employer] [place].    Kimberly Mathis   [facility] Specialty Technician

## 2020-10-07 ENCOUNTER — Other Ambulatory Visit: Admit: 2020-10-07 | Discharge: 2020-10-07 | Payer: MEDICARE

## 2020-10-07 ENCOUNTER — Ambulatory Visit: Admit: 2020-10-07 | Discharge: 2020-10-07 | Payer: MEDICARE

## 2020-10-07 DIAGNOSIS — C9210 Chronic myeloid leukemia, BCR/ABL-positive, not having achieved remission: Principal | ICD-10-CM

## 2020-10-07 LAB — CBC W/ AUTO DIFF
BASOPHILS ABSOLUTE COUNT: 0.1 10*9/L (ref 0.0–0.1)
BASOPHILS RELATIVE PERCENT: 0.9 %
EOSINOPHILS ABSOLUTE COUNT: 0.2 10*9/L (ref 0.0–0.5)
EOSINOPHILS RELATIVE PERCENT: 1.3 %
HEMATOCRIT: 32.4 % — ABNORMAL LOW (ref 34.0–44.0)
HEMOGLOBIN: 10.5 g/dL — ABNORMAL LOW (ref 11.3–14.9)
LYMPHOCYTES ABSOLUTE COUNT: 3.6 10*9/L (ref 1.1–3.6)
LYMPHOCYTES RELATIVE PERCENT: 27.7 %
MEAN CORPUSCULAR HEMOGLOBIN CONC: 32.5 g/dL (ref 32.0–36.0)
MEAN CORPUSCULAR HEMOGLOBIN: 28 pg (ref 25.9–32.4)
MEAN CORPUSCULAR VOLUME: 86.1 fL (ref 77.6–95.7)
MEAN PLATELET VOLUME: 8.4 fL (ref 6.8–10.7)
MONOCYTES ABSOLUTE COUNT: 0.8 10*9/L (ref 0.3–0.8)
MONOCYTES RELATIVE PERCENT: 6 %
NEUTROPHILS ABSOLUTE COUNT: 8.3 10*9/L — ABNORMAL HIGH (ref 1.8–7.8)
NEUTROPHILS RELATIVE PERCENT: 64.1 %
PLATELET COUNT: 350 10*9/L (ref 150–450)
RED BLOOD CELL COUNT: 3.76 10*12/L — ABNORMAL LOW (ref 3.95–5.13)
RED CELL DISTRIBUTION WIDTH: 16.7 % — ABNORMAL HIGH (ref 12.2–15.2)
WBC ADJUSTED: 12.9 10*9/L — ABNORMAL HIGH (ref 3.6–11.2)

## 2020-10-07 LAB — COMPREHENSIVE METABOLIC PANEL
ALBUMIN: 4.1 g/dL (ref 3.4–5.0)
ALKALINE PHOSPHATASE: 70 U/L (ref 46–116)
ALT (SGPT): 22 U/L (ref 10–49)
ANION GAP: 5 mmol/L (ref 5–14)
AST (SGOT): 21 U/L (ref <=34)
BILIRUBIN TOTAL: 0.3 mg/dL (ref 0.3–1.2)
BLOOD UREA NITROGEN: 6 mg/dL — ABNORMAL LOW (ref 9–23)
BUN / CREAT RATIO: 13
CALCIUM: 9.7 mg/dL (ref 8.7–10.4)
CHLORIDE: 101 mmol/L (ref 98–107)
CO2: 32 mmol/L — ABNORMAL HIGH (ref 20.0–31.0)
CREATININE: 0.46 mg/dL — ABNORMAL LOW
EGFR CKD-EPI (2021) FEMALE: >90 mL/min/{1.73_m2} (ref >=60)
GLUCOSE RANDOM: 122 mg/dL (ref 70–179)
POTASSIUM: 3.8 mmol/L (ref 3.4–4.8)
PROTEIN TOTAL: 7.8 g/dL (ref 5.7–8.2)
SODIUM: 138 mmol/L (ref 135–145)

## 2020-10-08 NOTE — Unmapped (Signed)
Spoke with the facility about cancelling her appointment for 10/19

## 2020-10-08 NOTE — Unmapped (Addendum)
Ms.Hasley is a 55 y.o. female with CML who I am seeing in clinic today for oral chemotherapy monitoring    Encounter Date: 10/07/2020    Current Treatment: Imatinib 400 mg PO daily    Treatment History: Imatinib started 12/31/15    Medication Access: getting filled at [facility]; $0 copay with Medicaid; being delivered directly to assisted living facility [facility])    Interval History: Ms. Bucklin has been doing well since her previous visit. She reports no adverse effects related to imatinib initiation, and endorses taking her medication daily (with assistance from staff at [facility] where she resides). She does mention some knee pain but this is not new.      Labs: WBC 12.9; ANC 8.3; Hgb 10.5; PLT 350; CMP stable and BCR-ABL pending    Oncologic History:  Oncology History Overview Note   Diagnosis: CML    SOKAL Score:    Presentation: asymptomatic; leukocytosis found on routine CBC    Presenting WBC Count: 17,400    Bone Marrow Biopsy: not performed as patient unable to tolerate procedure.    Cytogenetics:  Abnormal FISH: An interphase FISH assay shows an abnormal signal pattern consistent with BCR/ABL1 fusion in 44% of the 100 cells scored. Of note, these cells contain an atypical abnormal signal pattern consistent with BCR/ABL1 rearrangement and loss of the ABL1/ASS1 region.    Treatment:  Imatinib 400 mg daily- 12/31/2015    12/31/15:  BCR-ABL1 p210 transcripts were detected at a level of 9.511 IS% ratio in blood.  BCR-ABL1 p190 transcripts were detected at a level of 3 in 100,000 cells in blood.    03/23/16:  BCR-ABL1 p210 transcripts were detected at a level of 0.702 IS % ratio in blood.    Normal FISH:  A BCR/ABL1 interphase FISH assay for the previously documented 9;22 translocation shows a normal signal pattern in 100% of the 200 nuclei scored     06/15/16:  BCR-ABL1 p210 transcripts were detected at a level of 0.272 IS % ratio in blood.    09/2016:  0.298 IS %    12/18: 0.241 IS%    03/22/17: 0.072%    06/21/17: 0.052%    09/27/17: 0.125%    01/03/18: 0.040%    05/28/18: 0.008%    09/13/18: 0.003%    01/14/2019: 0.181%  02/05/2019: 0.149%  04/10/2019: 0.037%  09/03/19: 0.040%  12/04/19: 0.004%  03/04/20: 0.032%       CML (chronic myelocytic leukemia) (CMS-HCC)   12/28/2015 Initial Diagnosis    CML (chronic myelocytic leukemia) (RAF-HCC)       Weight and Vitals:  Wt Readings from Last 3 Encounters:   07/24/20 (!) 145.1 kg (319 lb 14.2 oz)   12/23/19 (!) 126.4 kg (278 lb 10.6 oz)   09/03/19 (!) 128.4 kg (283 lb)     Temp Readings from Last 3 Encounters:   07/27/20 37 ??C (98.6 ??F) (Oral)   06/30/20 36.9 ??C (98.4 ??F) (Temporal)   03/04/20 36.9 ??C (98.4 ??F) (Oral)     BP Readings from Last 3 Encounters:   07/27/20 124/59   06/30/20 140/88   03/04/20 135/90     Pulse Readings from Last 3 Encounters:   07/27/20 101   06/30/20 108   03/04/20 116     Pertinent Labs:  Lab on 10/07/2020   Component Date Value Ref Range Status   ??? Sodium 10/07/2020 138  135 - 145 mmol/L Final   ??? Potassium 10/07/2020 3.8  3.4 - 4.8  mmol/L Final   ??? Chloride 10/07/2020 101  98 - 107 mmol/L Final   ??? CO2 10/07/2020 32.0 (A) 20.0 - 31.0 mmol/L Final   ??? Anion Gap 10/07/2020 5  5 - 14 mmol/L Final   ??? BUN 10/07/2020 6 (A) 9 - 23 mg/dL Final   ??? Creatinine 10/07/2020 0.46 (A) 0.60 - 0.80 mg/dL Final   ??? BUN/Creatinine Ratio 10/07/2020 13   Final   ??? eGFR CKD-EPI (2021) Female 10/07/2020 >90  >=60 mL/min/1.77m2 Final    eGFR calculated with CKD-EPI 2021 equation in accordance with [employer] and [employer] of Nephrology Task Force recommendations.   ??? Glucose 10/07/2020 122  70 - 179 mg/dL Final   ??? Calcium 16/10/9602 9.7  8.7 - 10.4 mg/dL Final   ??? Albumin 54/09/8117 4.1  3.4 - 5.0 g/dL Final   ??? Total Protein 10/07/2020 7.8  5.7 - 8.2 g/dL Final   ??? Total Bilirubin 10/07/2020 0.3  0.3 - 1.2 mg/dL Final   ??? AST 14/78/2956 21  <=34 U/L Final   ??? ALT 10/07/2020 22  10 - 49 U/L Final   ??? Alkaline Phosphatase 10/07/2020 70  46 - 116 U/L Final   ??? Collection 10/07/2020 Collected   Final   ??? WBC 10/07/2020 12.9 (A) 3.6 - 11.2 10*9/L Final   ??? RBC 10/07/2020 3.76 (A) 3.95 - 5.13 10*12/L Final   ??? HGB 10/07/2020 10.5 (A) 11.3 - 14.9 g/dL Final   ??? HCT 21/30/8657 32.4 (A) 34.0 - 44.0 % Final   ??? MCV 10/07/2020 86.1  77.6 - 95.7 fL Final   ??? MCH 10/07/2020 28.0  25.9 - 32.4 pg Final   ??? MCHC 10/07/2020 32.5  32.0 - 36.0 g/dL Final   ??? RDW 84/69/6295 16.7 (A) 12.2 - 15.2 % Final   ??? MPV 10/07/2020 8.4  6.8 - 10.7 fL Final   ??? Platelet 10/07/2020 350  150 - 450 10*9/L Final   ??? Neutrophils % 10/07/2020 64.1  % Final   ??? Lymphocytes % 10/07/2020 27.7  % Final   ??? Monocytes % 10/07/2020 6.0  % Final   ??? Eosinophils % 10/07/2020 1.3  % Final   ??? Basophils % 10/07/2020 0.9  % Final   ??? Absolute Neutrophils 10/07/2020 8.3 (A) 1.8 - 7.8 10*9/L Final   ??? Absolute Lymphocytes 10/07/2020 3.6  1.1 - 3.6 10*9/L Final   ??? Absolute Monocytes 10/07/2020 0.8  0.3 - 0.8 10*9/L Final   ??? Absolute Eosinophils 10/07/2020 0.2  0.0 - 0.5 10*9/L Final   ??? Absolute Basophils 10/07/2020 0.1  0.0 - 0.1 10*9/L Final   ??? Anisocytosis 10/07/2020 Slight (A) Not Present Final     Current Medications:   Current Outpatient Medications   Medication Sig Dispense Refill   ??? acetaminophen (TYLENOL) 325 MG tablet acetaminophen 325 mg tablet   TAKE 3 TABLETS BY MOUTH TWICE DAILY.     ??? allopurinoL (ZYLOPRIM) 100 MG tablet Take 100 mg by mouth daily.     ??? bisacodyL (DULCOLAX) 5 mg EC tablet Take 10 mg by mouth daily as needed for constipation.     ??? clozapine (CLOZARIL) 100 MG tablet Take 150 mg by mouth in the morning. 150 mg in the AM and 300 mg at bedtime.     ??? diclofenac sodium (VOLTAREN) 1 % gel      ??? diphenoxylate-atropine (LOMOTIL) 2.5-0.025 mg per tablet Take 1 tablet by mouth four (4) times a day as needed for diarrhea.     ???  famotidine (PEPCID) 20 MG tablet Take 20 mg by mouth Two (2) times a day.     ??? imatinib (GLEEVEC) 400 MG tablet Take 1 tablet (400 mg total) by mouth daily. 30 tablet 11   ??? lidocaine (LIDODERM) 5 % patch Place 1 patch on the skin daily. Apply to affected area for 12 hours only each day (then remove patch)     ??? metFORMIN (GLUCOPHAGE) 1000 MG tablet Take 1,000 mg by mouth daily before breakfast. 1000 mg in the AM and 500 mg at bedtime     ??? metoprolol tartrate (LOPRESSOR) 25 MG tablet Take 25 mg by mouth Two (2) times a day.     ??? multivitamin (TAB-A-VITE/THERAGRAN) per tablet Take 1 capsule by mouth daily.     ??? ondansetron (ZOFRAN) 4 MG tablet Take 4 mg by mouth every eight (8) hours as needed for nausea.     ??? pravastatin (PRAVACHOL) 20 MG tablet Take 20 mg by mouth at bedtime.     ??? QUEtiapine (SEROQUEL) 50 MG tablet Take 50 mg by mouth nightly.     ??? saliva stimulant comb. no.3 (BIOTENE MOISTURIZING MOUTH) Spry 1 spray every two (2) hours as needed.     ??? sertraline (ZOLOFT) 50 MG tablet Take 50 mg by mouth in the morning.     ??? valproic acid (DEPAKENE) 250 mg capsule Take 500 mg by mouth Three (3) times a day.       No current facility-administered medications for this visit.     Allergies: No Known Allergies    Adherence: No concerns noted but patient's medications are managed by staff at Assisted Living Facility; looking at [facility] fill history imatinib is being filled consistently and will request MAR from facility as well to confirm adherence     Drug Interactions: None identified (counts stable on clozapine and imatinib).    Assessment: Ms.Dusek is a 55 y.o. female with CML being treated currently with imatinib. She is tolerating therapy well with no complaints or adverse effects reported. BCR-ABL pending today.    Plan:   1) CML  -Continue imatinib 400 mg PO daily    F/u: In 3 mo with APP Oliver Hum or Dr. Vertell Limber     I spent 15 minutes with Ms.Jackowski in direct patient care.    Ronnald Collum, PharmD, BCOP, CPP  Hematology/Oncology Clinical Pharmacist  Pager [phone]

## 2020-10-10 DIAGNOSIS — C9210 Chronic myeloid leukemia, BCR/ABL-positive, not having achieved remission: Principal | ICD-10-CM

## 2020-10-12 ENCOUNTER — Ambulatory Visit: Admit: 2020-10-12 | Discharge: 2020-10-13 | Payer: MEDICARE

## 2020-10-18 MED FILL — IMATINIB 400 MG TABLET: ORAL | 30 days supply | Qty: 30 | Fill #1

## 2020-10-18 NOTE — Unmapped (Signed)
[facility] Refill Coordination Note    Specialty Medication(s) to be Shipped:   Hematology/Oncology: Imatinib    Other medication(s) to be shipped: No additional medications requested for fill at this time     Kimberly Mathis, DOB: [birth year]  Phone: There are no phone numbers on file.      All above HIPAA information was verified with patient's caregiver, Kimberly Mathis     Was a translator used for this call? No    Completed refill call assessment today to schedule patient's medication shipment from the [facility] [phone]).  All relevant notes have been reviewed.     Specialty medication(s) and dose(s) confirmed: Regimen is correct and unchanged.   Changes to medications: Kimberly Mathis reports no changes at this time.  Changes to insurance: No  New side effects reported not previously addressed with a pharmacist or physician: None reported  Questions for the pharmacist: No    Confirmed patient received a Conservation officer, historic buildings and a Surveyor, mining with first shipment. The patient will receive a drug information handout for each medication shipped and additional FDA Medication Guides as required.       DISEASE/MEDICATION-SPECIFIC INFORMATION        N/A    SPECIALTY MEDICATION ADHERENCE     Medication Adherence    Patient reported X missed doses in the last month: 2  Specialty Medication: Imatinib 400 mg  Patient is on additional specialty medications: No  Informant: caregiver  Support network for adherence: home health agency              Were doses missed due to medication being on hold? No    Imatinib 400 mg: 0 days of medicine on hand       REFERRAL TO PHARMACIST     Referral to the pharmacist: Not needed      [facility]     Shipping address confirmed in Epic.     Delivery Scheduled: Yes, Expected medication delivery date: 10/18/20.     Medication will be delivered via Same Day Courier to the prescription address in Epic [place].    Kimberly Mathis   [facility] Specialty Technician

## 2020-11-04 NOTE — Unmapped (Signed)
11/04/20 RN called back and said she current has a month a half on hand and will like a call back in 5 weeks. So around 12/07/20.

## 2020-12-07 NOTE — Unmapped (Signed)
[facility] Clinical Assessment & Refill Coordination Note    Kimberly Mathis, DOB: Jun 04, 1965  Phone: There are no phone numbers on file.    All above HIPAA information was verified with patient's caregiver, Nurse at [facility] [place]).     Was a Nurse, learning disability used for this call? No    Specialty Medication(s):   Hematology/Oncology: Imatinib     Current Outpatient Medications   Medication Sig Dispense Refill   ??? acetaminophen (TYLENOL) 325 MG tablet acetaminophen 325 mg tablet   TAKE 3 TABLETS BY MOUTH TWICE DAILY.     ??? allopurinoL (ZYLOPRIM) 100 MG tablet Take 100 mg by mouth daily.     ??? bisacodyL (DULCOLAX) 5 mg EC tablet Take 10 mg by mouth daily as needed for constipation.     ??? clozapine (CLOZARIL) 100 MG tablet Take 150 mg by mouth in the morning. 150 mg in the AM and 300 mg at bedtime.     ??? diclofenac sodium (VOLTAREN) 1 % gel      ??? diphenoxylate-atropine (LOMOTIL) 2.5-0.025 mg per tablet Take 1 tablet by mouth four (4) times a day as needed for diarrhea.     ??? famotidine (PEPCID) 20 MG tablet Take 20 mg by mouth Two (2) times a day.     ??? imatinib (GLEEVEC) 400 MG tablet Take 1 tablet (400 mg total) by mouth daily. 30 tablet 11   ??? lidocaine (LIDODERM) 5 % patch Place 1 patch on the skin daily. Apply to affected area for 12 hours only each day (then remove patch)     ??? metFORMIN (GLUCOPHAGE) 1000 MG tablet Take 1,000 mg by mouth daily before breakfast. 1000 mg in the AM and 500 mg at bedtime     ??? metoprolol tartrate (LOPRESSOR) 25 MG tablet Take 25 mg by mouth Two (2) times a day.     ??? multivitamin (TAB-A-VITE/THERAGRAN) per tablet Take 1 capsule by mouth daily.     ??? ondansetron (ZOFRAN) 4 MG tablet Take 4 mg by mouth every eight (8) hours as needed for nausea.     ??? pravastatin (PRAVACHOL) 20 MG tablet Take 20 mg by mouth at bedtime.     ??? QUEtiapine (SEROQUEL) 50 MG tablet Take 50 mg by mouth nightly.     ??? saliva stimulant comb. no.3 (BIOTENE MOISTURIZING MOUTH) Spry 1 spray every two (2) hours as needed.     ??? sertraline (ZOLOFT) 50 MG tablet Take 50 mg by mouth in the morning.     ??? valproic acid (DEPAKENE) 250 mg capsule Take 500 mg by mouth Three (3) times a day.       No current facility-administered medications for this visit.        Changes to medications: Alisen reports no changes at this time.    No Known Allergies    Changes to allergies: No    SPECIALTY MEDICATION ADHERENCE     Imatinib 400 mg: 30 days of medicine on hand     Medication Adherence    Patient reported X missed doses in the last month: 0  Specialty Medication: imatinib 400 mg tablets once daily  Patient is on additional specialty medications: No  Informant: caregiver  Support network for adherence: home health agency  Confirmed plan for next specialty medication refill: not applicable  Refills needed for supportive medications: not needed          Specialty medication(s) dose(s) confirmed: Regimen is correct and unchanged.  Are there any concerns with adherence? Nurse denied missed doses.  [facility] last dispensed 30 day supply on 10/18/20 and patient has 1 full bottle on hand     Adherence counseling provided? Not needed    CLINICAL MANAGEMENT AND INTERVENTION      Clinical Benefit Assessment:    Do you feel the medicine is effective or helping your condition? Yes    Clinical Benefit counseling provided? Not needed    Adverse Effects Assessment:    Are you experiencing any side effects? No    Are you experiencing difficulty administering your medicine? No    Quality of Life Assessment:    Quality of Life    Rheumatology  Oncology  2. On a scale of 1-10, how would you rate your ability to manage side effects associated with your specialty medication? (1=no issues, 10 = unable to take medication due to side effects): 1  Dermatology  Cystic Fibrosis          How many days over the past month did your CML  keep you from your normal activities? For example, brushing your teeth or getting up in the morning. 0    Have you discussed this with your provider? Not needed    Acute Infection Status:    Acute infections noted within Epic:  No active infections  Patient reported infection: None    Therapy Appropriateness:    Is therapy appropriate and patient progressing towards therapeutic goals? Yes, therapy is appropriate and should be continued    DISEASE/MEDICATION-SPECIFIC INFORMATION      N/A    PATIENT SPECIFIC NEEDS     - Does the patient have any physical, cognitive, or cultural barriers? No    - Is the patient high risk? Yes, patient is taking oral chemotherapy. Appropriateness of therapy as been assessed    - Does the patient require a Care Management Plan? No     SOCIAL DETERMINANTS OF HEALTH     At the [facility], we have learned that life circumstances - like trouble affording food, housing, utilities, or transportation can affect the health of many of our patients.   That is why we wanted to ask: are you currently experiencing any life circumstances that are negatively impacting your health and/or quality of life? Patient declined to answer    Social Determinants of Health     Food Insecurity: Not on file   Tobacco Use: Medium Risk   ??? Smoking Tobacco Use: Former   ??? Smokeless Tobacco Use: Never   ??? Passive Exposure: Not on file   Transportation Needs: Not on file   Alcohol Use: Not on file   Housing/Utilities: Unknown   ??? Within the past 12 months, have you ever stayed: outside, in a car, in a tent, in an overnight shelter, or temporarily in someone else's home (i.e. couch-surfing)?: Patient refused   ??? Are you worried about losing your housing?: Not on file   ??? Within the past 12 months, have you been unable to get utilities (heat, electricity) when it was really needed?: Not on file   Substance Use: Not on file   Financial Resource Strain: Not on file   Physical Activity: Not on file   Health Literacy: Not on file   Stress: Not on file   Intimate Partner Violence: Not on file Depression: Not on file   Social Connections: Not on file       Would you be willing to receive help with  any of the needs that you have identified today? Not applicable       SHIPPING     Specialty Medication(s) to be Shipped:   Hematology/Oncology: none    Other medication(s) to be shipped: No additional medications requested for fill at this time     Changes to insurance: No    Delivery Scheduled: Patient declined refill at this time due to having 30 day supply on hand.     Medication will be delivered via NA to the confirmed NA address in [facility].    The patient will receive a drug information handout for each medication shipped and additional FDA Medication Guides as required.  Verified that patient has previously received a Conservation officer, historic buildings and a Surveyor, mining.    The patient or caregiver noted above participated in the development of this care plan and knows that they can request review of or adjustments to the care plan at any time.      All of the patient's questions and concerns have been addressed.    Breck Coons Shared [facility] Specialty Pharmacist

## 2020-12-22 DIAGNOSIS — C9210 Chronic myeloid leukemia, BCR/ABL-positive, not having achieved remission: Principal | ICD-10-CM

## 2020-12-23 ENCOUNTER — Other Ambulatory Visit: Admit: 2020-12-23 | Discharge: 2020-12-24 | Payer: MEDICARE

## 2020-12-23 ENCOUNTER — Ambulatory Visit: Admit: 2020-12-23 | Discharge: 2020-12-24 | Payer: MEDICARE

## 2020-12-23 DIAGNOSIS — C9210 Chronic myeloid leukemia, BCR/ABL-positive, not having achieved remission: Principal | ICD-10-CM

## 2020-12-23 LAB — CBC W/ AUTO DIFF
BASOPHILS ABSOLUTE COUNT: 0.1 10*9/L (ref 0.0–0.1)
BASOPHILS RELATIVE PERCENT: 1.1 %
EOSINOPHILS ABSOLUTE COUNT: 0.2 10*9/L (ref 0.0–0.5)
EOSINOPHILS RELATIVE PERCENT: 1.9 %
HEMATOCRIT: 35.6 % (ref 34.0–44.0)
HEMOGLOBIN: 11.3 g/dL (ref 11.3–14.9)
LYMPHOCYTES ABSOLUTE COUNT: 3.6 10*9/L (ref 1.1–3.6)
LYMPHOCYTES RELATIVE PERCENT: 34.9 %
MEAN CORPUSCULAR HEMOGLOBIN CONC: 31.7 g/dL — ABNORMAL LOW (ref 32.0–36.0)
MEAN CORPUSCULAR HEMOGLOBIN: 26.8 pg (ref 25.9–32.4)
MEAN CORPUSCULAR VOLUME: 84.6 fL (ref 77.6–95.7)
MEAN PLATELET VOLUME: 8.5 fL (ref 6.8–10.7)
MONOCYTES ABSOLUTE COUNT: 1 10*9/L — ABNORMAL HIGH (ref 0.3–0.8)
MONOCYTES RELATIVE PERCENT: 9.5 %
NEUTROPHILS ABSOLUTE COUNT: 5.4 10*9/L (ref 1.8–7.8)
NEUTROPHILS RELATIVE PERCENT: 52.6 %
PLATELET COUNT: 325 10*9/L (ref 150–450)
RED BLOOD CELL COUNT: 4.21 10*12/L (ref 3.95–5.13)
RED CELL DISTRIBUTION WIDTH: 16.9 % — ABNORMAL HIGH (ref 12.2–15.2)
WBC ADJUSTED: 10.3 10*9/L (ref 3.6–11.2)

## 2020-12-23 LAB — COMPREHENSIVE METABOLIC PANEL
ALBUMIN: 4.2 g/dL (ref 3.4–5.0)
ALKALINE PHOSPHATASE: 88 U/L (ref 46–116)
ALT (SGPT): 12 U/L (ref 10–49)
ANION GAP: 11 mmol/L (ref 5–14)
BILIRUBIN TOTAL: 0.2 mg/dL — ABNORMAL LOW (ref 0.3–1.2)
BLOOD UREA NITROGEN: 6 mg/dL — ABNORMAL LOW (ref 9–23)
BUN / CREAT RATIO: 12
CALCIUM: 9 mg/dL (ref 8.7–10.4)
CHLORIDE: 102 mmol/L (ref 98–107)
CO2: 28 mmol/L (ref 20.0–31.0)
CREATININE: 0.5 mg/dL — ABNORMAL LOW
EGFR CKD-EPI (2021) FEMALE: >90 mL/min/{1.73_m2} (ref >=60)
GLUCOSE RANDOM: 119 mg/dL (ref 70–179)
PROTEIN TOTAL: 8.4 g/dL — ABNORMAL HIGH (ref 5.7–8.2)
SODIUM: 141 mmol/L (ref 135–145)

## 2020-12-23 NOTE — Unmapped (Signed)
[facility] Follow-up    Patient Name: Kimberly Mathis  Patient Age: 55 y.o.  Encounter Date: 12/23/2020    Primary Care Provider:  Marcy Siren, PA    Referring Physician:  Desiree Lucy, PA  [address]  [address]  [place],  [place] [address]    Reason for visit:   F/u visit for [facility]    Assessment:  Kimberly Mathis is a 55 y.o. 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 She began treatment on imatinib in 12/2015 with optimal response and meeting appropriate treatment milestones, except her one year PCR, which was slightly above goal.  She has been in a MMR and MR4.0 since 05/2018 until Jan 2021 when she lost MMR d/t caregiver change and missing medication.   She has again had an increase in her BCR/ABL with loss of MMR.  She presents today for follow up.       Kimberly Mathis presents again today without a caregiver and cannot provide thorough history.   She resides in long term care home and is dropped off for her clinic appointments.  MAR that accompanies her indicates that she is taking her imatinib apprpriately, however on last BCR/ABL PCR she had a 1-log increase in her transcripts.  On lab review her counts are stable and she has repeat BCR/ABL PCR pending from today.   She is feeling well and continues to c/o right knee pain.  She reports she is tolerating imatinib without difficulty.  As long as her BCR/ABLremains stable and not continuing to increase we will plan to see her back in clinic in 3 months.  If she continues to have increasing BCR/ABL we will need to consider swtiching TKI.   For now she should continue imatinib 400mg  daily.       Plan and Recommendations:  - continue Imatinib 400 mg daily  - RTC in 3 months with BCR/ABL PCR lab check      Dr. Vertell Limber was available    Arna Medici, AGNP-BC  Leukemia Research Nurse Practitioner  Hematology/Oncology Division  [facility]  12/23/2020     I personally spent 40 minutes face-to-face and non-face-to-face in the care of this patient, which includes all pre, intra, and post visit time on the date of service.    History of Present Illness:  Oncology History Overview Note   Diagnosis: CML    SOKAL Score:    Presentation: asymptomatic; leukocytosis found on routine CBC    Presenting WBC Count: 17,400    Bone Marrow Biopsy: not performed as patient unable to tolerate procedure.    Cytogenetics:  Abnormal FISH: An interphase FISH assay shows an abnormal signal pattern consistent with BCR/ABL1 fusion in 44% of the 100 cells scored. Of note, these cells contain an atypical abnormal signal pattern consistent with BCR/ABL1 rearrangement and loss of the ABL1/ASS1 region.    Treatment:  Imatinib 400 mg daily- 12/31/2015    12/31/15:  BCR-ABL1 p210 transcripts were detected at a level of 9.511 IS% ratio in blood.  BCR-ABL1 p190 transcripts were detected at a level of 3 in 100,000 cells in blood.    03/23/16:  BCR-ABL1 p210 transcripts were detected at a level of 0.702 IS % ratio in blood.    Normal FISH:  A BCR/ABL1 interphase FISH assay for the previously documented 9;22 translocation shows a normal signal pattern in 100% of the 200 nuclei scored  06/15/16:  BCR-ABL1 p210 transcripts were detected at a level of 0.272 IS % ratio in blood.    09/2016:  0.298 IS %    12/18: 0.241 IS%    03/22/17: 0.072%    06/21/17: 0.052%    09/27/17: 0.125%    01/03/18: 0.040%    05/28/18: 0.008%    09/13/18: 0.003%    01/14/2019: 0.181%  02/05/2019: 0.149%  04/10/2019: 0.037%  09/03/19: 0.040%  12/04/19: 0.004%  03/04/20: 0.032%       CML (chronic myelocytic leukemia) (CMS-HCC)   12/28/2015 Initial Diagnosis    CML (chronic myelocytic leukemia) (RAF-HCC)         Interval History:  Kimberly Mathis comes to clinic unaccompanied.  She denies any recent n/v.  She states she is able to tolerate her medications without any issue.  She continues to c/o knee and back pain.  She also endorses feeling really sleeping today.  She ambulates with the assistance of a wheelchair. Again, she does not have a caregiver to provide accurate history.      Past Medical, Surgical and Family History were reviewed and pertinent updates were made in the Electronic Medical Record    Review of Systems:  All other systems reviewed were negative.     ECOG Performance Status: 2    Past Medical History:  Past Medical History:   Diagnosis Date   ??? GERD (gastroesophageal reflux disease)    ??? Gout    ??? HTN (hypertension)    ??? Morbid obesity (CMS-HCC)    ??? Osteoporosis    ??? Schizoaffective disorder, bipolar type (CMS-HCC)    ??? Type II diabetes mellitus (CMS-HCC)        Medications:    Current Outpatient Medications   Medication Sig Dispense Refill   ??? acetaminophen (TYLENOL) 325 MG tablet acetaminophen 325 mg tablet   TAKE 3 TABLETS BY MOUTH TWICE DAILY.     ??? allopurinoL (ZYLOPRIM) 100 MG tablet Take 100 mg by mouth daily.     ??? bisacodyL (DULCOLAX) 5 mg EC tablet Take 10 mg by mouth daily as needed for constipation.     ??? clozapine (CLOZARIL) 100 MG tablet Take 150 mg by mouth in the morning. 150 mg in the AM and 300 mg at bedtime.     ??? diclofenac sodium (VOLTAREN) 1 % gel      ??? diphenoxylate-atropine (LOMOTIL) 2.5-0.025 mg per tablet Take 1 tablet by mouth four (4) times a day as needed for diarrhea.     ??? famotidine (PEPCID) 20 MG tablet Take 20 mg by mouth Two (2) times a day.     ??? imatinib (GLEEVEC) 400 MG tablet Take 1 tablet (400 mg total) by mouth daily. 30 tablet 11   ??? lidocaine (LIDODERM) 5 % patch Place 1 patch on the skin daily. Apply to affected area for 12 hours only each day (then remove patch)     ??? metFORMIN (GLUCOPHAGE) 1000 MG tablet Take 1,000 mg by mouth daily before breakfast. 1000 mg in the AM and 500 mg at bedtime     ??? metoprolol tartrate (LOPRESSOR) 25 MG tablet Take 25 mg by mouth Two (2) times a day.     ??? multivitamin (TAB-A-VITE/THERAGRAN) per tablet Take 1 capsule by mouth daily. ??? ondansetron (ZOFRAN) 4 MG tablet Take 4 mg by mouth every eight (8) hours as needed for nausea.     ??? pravastatin (PRAVACHOL) 20 MG tablet Take 20 mg by mouth at bedtime.     ???  QUEtiapine (SEROQUEL) 50 MG tablet Take 50 mg by mouth nightly.     ??? saliva stimulant comb. no.3 (BIOTENE MOISTURIZING MOUTH) Spry 1 spray every two (2) hours as needed.     ??? sertraline (ZOLOFT) 50 MG tablet Take 50 mg by mouth in the morning.     ??? valproic acid (DEPAKENE) 250 mg capsule Take 500 mg by mouth Three (3) times a day.       No current facility-administered medications for this visit.     Vital Signs:  BSA: 2.58 meters squared  Vitals:    12/23/20 1038   BP: 137/89   Pulse: 98   Resp: 16   Temp: 36.9 ??C (98.5 ??F)   SpO2: 95%     Physical Exam:  Constitutional: Obese woman sitting in wheelchair, no acute distress  Eyes: PERRL. No scleral icterus or conjunctival injection.  Cardiovascular:  RRR.  S1, S2.  No murmurs, gallops or rubs.  No edema.  Respiratory:  Breathing is unlabored.  CTAB. No rales, ronchi or crackles.    GI:  No distention or pain on palpation.  Bowel sounds are present.  No palpable masses.  Musculoskeletal:   strength equal bilaterally  Skin:  No rashes, petechiae or purpura.  grossly intact.   Neurologic:  alert and oriented to person/place, forced speech  Psychiatric: tangential requiring frequent re-direction    Relevant Laboratory, radiology and pathology results:  Lab on 12/23/2020   Component Date Value Ref Range Status   ??? Sodium 12/23/2020 141  135 - 145 mmol/L Final   ??? Potassium 12/23/2020    Final    Specimen Hemolyzed      ??? Chloride 12/23/2020 102  98 - 107 mmol/L Final   ??? CO2 12/23/2020 28.0  20.0 - 31.0 mmol/L Final   ??? Anion Gap 12/23/2020 11  5 - 14 mmol/L Final   ??? BUN 12/23/2020 6 (L)  9 - 23 mg/dL Final   ??? Creatinine 12/23/2020 0.50 (L)  0.60 - 0.80 mg/dL Final   ??? BUN/Creatinine Ratio 12/23/2020 12   Final   ??? eGFR CKD-EPI (2021) Female 12/23/2020 >90  >=60 mL/min/1.74m2 Final eGFR calculated with CKD-EPI 2021 equation in accordance with [employer] and [employer] of Nephrology Task Force recommendations.   ??? Glucose 12/23/2020 119  70 - 179 mg/dL Final   ??? Calcium 16/10/9602 9.0  8.7 - 10.4 mg/dL Final   ??? Albumin 54/09/8117 4.2  3.4 - 5.0 g/dL Final   ??? Total Protein 12/23/2020 8.4 (H)  5.7 - 8.2 g/dL Final   ??? Total Bilirubin 12/23/2020 0.2 (L)  0.3 - 1.2 mg/dL Final   ??? AST 14/78/2956    Final    Specimen Hemolyzed      ??? ALT 12/23/2020 12  10 - 49 U/L Final   ??? Alkaline Phosphatase 12/23/2020 88  46 - 116 U/L Final   ??? WBC 12/23/2020 10.3  3.6 - 11.2 10*9/L Final   ??? RBC 12/23/2020 4.21  3.95 - 5.13 10*12/L Final   ??? HGB 12/23/2020 11.3  11.3 - 14.9 g/dL Final   ??? HCT 21/30/8657 35.6  34.0 - 44.0 % Final   ??? MCV 12/23/2020 84.6  77.6 - 95.7 fL Final   ??? MCH 12/23/2020 26.8  25.9 - 32.4 pg Final   ??? MCHC 12/23/2020 31.7 (L)  32.0 - 36.0 g/dL Final   ??? RDW 84/69/6295 16.9 (H)  12.2 - 15.2 % Final   ??? MPV 12/23/2020 8.5  6.8 - 10.7 fL Final   ??? Platelet 12/23/2020 325  150 - 450 10*9/L Final   ??? Neutrophils % 12/23/2020 52.6  % Final   ??? Lymphocytes % 12/23/2020 34.9  % Final   ??? Monocytes % 12/23/2020 9.5  % Final   ??? Eosinophils % 12/23/2020 1.9  % Final   ??? Basophils % 12/23/2020 1.1  % Final   ??? Absolute Neutrophils 12/23/2020 5.4  1.8 - 7.8 10*9/L Final   ??? Absolute Lymphocytes 12/23/2020 3.6  1.1 - 3.6 10*9/L Final   ??? Absolute Monocytes 12/23/2020 1.0 (H)  0.3 - 0.8 10*9/L Final   ??? Absolute Eosinophils 12/23/2020 0.2  0.0 - 0.5 10*9/L Final   ??? Absolute Basophils 12/23/2020 0.1  0.0 - 0.1 10*9/L Final   ??? Anisocytosis 12/23/2020 Slight (A)  Not Present Final   ??? Hypochromasia 12/23/2020 Slight (A)  Not Present Final

## 2020-12-23 NOTE — Unmapped (Signed)
[facility] Clinical Assessment & Refill Coordination Note    Makeda Peeks, DOB: 1965/05/31  Phone: There are no phone numbers on file.    All above HIPAA information was verified with patient's caregiver, Darl Pikes at [employer].     Was a Nurse, learning disability used for this call? No    Specialty Medication(s):   Hematology/Oncology: Imatinib     Current Outpatient Medications   Medication Sig Dispense Refill   ??? acetaminophen (TYLENOL) 325 MG tablet acetaminophen 325 mg tablet   TAKE 3 TABLETS BY MOUTH TWICE DAILY.     ??? allopurinoL (ZYLOPRIM) 100 MG tablet Take 100 mg by mouth daily.     ??? bisacodyL (DULCOLAX) 5 mg EC tablet Take 10 mg by mouth daily as needed for constipation.     ??? clozapine (CLOZARIL) 100 MG tablet Take 150 mg by mouth in the morning. 150 mg in the AM and 300 mg at bedtime.     ??? diclofenac sodium (VOLTAREN) 1 % gel      ??? diphenoxylate-atropine (LOMOTIL) 2.5-0.025 mg per tablet Take 1 tablet by mouth four (4) times a day as needed for diarrhea.     ??? famotidine (PEPCID) 20 MG tablet Take 20 mg by mouth Two (2) times a day.     ??? imatinib (GLEEVEC) 400 MG tablet Take 1 tablet (400 mg total) by mouth daily. 30 tablet 11   ??? lidocaine (LIDODERM) 5 % patch Place 1 patch on the skin daily. Apply to affected area for 12 hours only each day (then remove patch)     ??? metFORMIN (GLUCOPHAGE) 1000 MG tablet Take 1,000 mg by mouth daily before breakfast. 1000 mg in the AM and 500 mg at bedtime     ??? metoprolol tartrate (LOPRESSOR) 25 MG tablet Take 25 mg by mouth Two (2) times a day.     ??? multivitamin (TAB-A-VITE/THERAGRAN) per tablet Take 1 capsule by mouth daily.     ??? ondansetron (ZOFRAN) 4 MG tablet Take 4 mg by mouth every eight (8) hours as needed for nausea.     ??? pravastatin (PRAVACHOL) 20 MG tablet Take 20 mg by mouth at bedtime.     ??? QUEtiapine (SEROQUEL) 50 MG tablet Take 50 mg by mouth nightly.     ??? saliva stimulant comb. no.3 (BIOTENE MOISTURIZING MOUTH) Spry 1 spray every two (2) hours as needed.     ??? sertraline (ZOLOFT) 50 MG tablet Take 50 mg by mouth in the morning.     ??? valproic acid (DEPAKENE) 250 mg capsule Take 500 mg by mouth Three (3) times a day.       No current facility-administered medications for this visit.        Changes to medications: NA    No Known Allergies    Changes to allergies: No    SPECIALTY MEDICATION ADHERENCE     Imatinib 400 mg: over 20 days of medicine on hand     Medication Adherence    Support network for adherence: home health agency          Specialty medication(s) dose(s) confirmed: Regimen is correct and unchanged.     Are there any concerns with adherence? Nurse denies missed doses.  [facility] called 12/07/20 and she had 30 day supply per nurse/  Today 12/23/20 she has over 20 day supply    Adherence counseling provided? reminded important for her to take meds daily.    CLINICAL MANAGEMENT AND INTERVENTION  Clinical Benefit Assessment:    Do you feel the medicine is effective or helping your condition? Patient declined to answer    Clinical Benefit counseling provided? Not needed    Adverse Effects Assessment:    Are you experiencing any side effects? nurse denied new or worsening side effects    Are you experiencing difficulty administering your medicine? No    Quality of Life Assessment:    Quality of Life    Rheumatology  Oncology  Dermatology  Cystic Fibrosis          How many days over the past month did your CML  keep you from your normal activities? For example, brushing your teeth or getting up in the morning. 0    Have you discussed this with your provider? Not needed    Acute Infection Status:    Acute infections noted within Epic:  No active infections  Patient reported infection: None    Therapy Appropriateness:    Is therapy appropriate and patient progressing towards therapeutic goals? Yes, therapy is appropriate and should be continued    DISEASE/MEDICATION-SPECIFIC INFORMATION      N/A    PATIENT SPECIFIC NEEDS - Does the patient have any physical, cognitive, or cultural barriers? Yes - physical and cognitive barriers    - Is the patient high risk? Yes, patient is taking oral chemotherapy. Appropriateness of therapy as been assessed    - Does the patient require a Care Management Plan? No     SOCIAL DETERMINANTS OF HEALTH     At the [facility], we have learned that life circumstances - like trouble affording food, housing, utilities, or transportation can affect the health of many of our patients.   That is why we wanted to ask: are you currently experiencing any life circumstances that are negatively impacting your health and/or quality of life? Patient declined to answer    Social Determinants of Health     Food Insecurity: Not on file   Tobacco Use: Medium Risk   ??? Smoking Tobacco Use: Former   ??? Smokeless Tobacco Use: Never   ??? Passive Exposure: Not on file   Transportation Needs: Not on file   Alcohol Use: Not on file   Housing/Utilities: Unknown   ??? Within the past 12 months, have you ever stayed: outside, in a car, in a tent, in an overnight shelter, or temporarily in someone else's home (i.e. couch-surfing)?: Patient refused   ??? Are you worried about losing your housing?: Not on file   ??? Within the past 12 months, have you been unable to get utilities (heat, electricity) when it was really needed?: Not on file   Substance Use: Not on file   Financial Resource Strain: Not on file   Physical Activity: Not on file   Health Literacy: Not on file   Stress: Not on file   Intimate Partner Violence: Not on file   Depression: Not on file   Social Connections: Not on file       Would you be willing to receive help with any of the needs that you have identified today? Not applicable       SHIPPING     Specialty Medication(s) to be Shipped:   Hematology/Oncology: none    Other medication(s) to be shipped: No additional medications requested for fill at this time     Changes to insurance: No    Delivery Scheduled: Patient declined refill at this time due to having over 20 day  supply.     Medication will be delivered via NA to the confirmed NA address in [facility].    The patient will receive a drug information handout for each medication shipped and additional FDA Medication Guides as required.  Verified that patient has previously received a Conservation officer, historic buildings and a Surveyor, mining.    The patient or caregiver noted above participated in the development of this care plan and knows that they can request review of or adjustments to the care plan at any time.      All of the patient's questions and concerns have been addressed.    Breck Coons Shared [facility] Specialty Pharmacist

## 2020-12-28 ENCOUNTER — Ambulatory Visit: Admit: 2020-12-28 | Discharge: 2020-12-29 | Payer: MEDICARE

## 2021-01-11 NOTE — Unmapped (Signed)
error 

## 2021-01-31 NOTE — Unmapped (Signed)
The [facility] has made a fourth and final attempt to reach this patient to refill the following medication:imatnib.      We have left voicemails on the following phone numbers: [phone] and have been unable to leave messages on the following phone numbers: [phone]. (Nurses' station phone keeps ringing now answer.  On 01/31/21 after calling back twice was able to speak with Carlynn Spry of nursing who took [facility] name and number and reported she will have a nurse call back to schedule delivery)    Dates contacted: 01/05; 01/09; 01/23; 01/31/21  Last scheduled delivery: 10/18/21  (11/04/20 RN called [facility] pharmacy and reported PT has a month a half on hand and will like a call back in 5 weeks - around 12/07/20; For December 2022 nurse reported [facility] had 30 day supply on hand and to call back in Jan 2023)    The patient may be at risk of non-compliance with this medication. The patient should call the [facility] at [phone]  Option 4, then Option 1 (oncology) to refill medication.    Breck Coons Shared [facility] Specialty Pharmacist

## 2021-01-31 NOTE — Unmapped (Signed)
Hi-yes just called. She is getting it together and will fax over asap. Thanks! (BTW I told her I needed October 2022 to present day)

## 2021-02-23 NOTE — Unmapped (Signed)
Specialty Medication(s): imatinib (5th call attempted today for refills)    [facility] has been dis-enrolled from the [facility] specialty pharmacy services due to [facility] unable to contact patient's nurse at [facility]isted Living facility.  Attempted 5 calls; left message with director of nursing but no response.  Patient may be getting imatinib from different pharmacy.  .Per clinic: Reviewing the uploaded [facility] - she did miss 9 total days in January... but has been okay prior to that. No missed doses in December, and 5 days missed on November. So I think she either has the extra supply or she may be getting from another pharmacy through the facility    Additional information provided to the patient: NA    Jr Milliron A [facility] [place] Specialty Pharmacist

## 2021-03-23 ENCOUNTER — Ambulatory Visit: Admit: 2021-03-23 | Payer: MEDICARE

## 2021-06-02 ENCOUNTER — Ambulatory Visit: Admit: 2021-06-02 | Discharge: 2021-06-02 | Payer: MEDICARE

## 2021-06-02 ENCOUNTER — Other Ambulatory Visit: Admit: 2021-06-02 | Discharge: 2021-06-02 | Payer: MEDICARE

## 2021-06-02 DIAGNOSIS — C9210 Chronic myeloid leukemia, BCR/ABL-positive, not having achieved remission: Principal | ICD-10-CM

## 2021-08-18 ENCOUNTER — Ambulatory Visit: Admit: 2021-08-18 | Discharge: 2021-08-19 | Disposition: A | Discharge status: Home / Self Care | Payer: MEDICAID

## 2021-09-01 ENCOUNTER — Other Ambulatory Visit: Admit: 2021-09-01 | Discharge: 2021-09-01 | Payer: MEDICAID

## 2021-09-01 ENCOUNTER — Ambulatory Visit: Admit: 2021-09-01 | Discharge: 2021-09-01 | Payer: MEDICARE

## 2021-09-01 DIAGNOSIS — C9210 Chronic myeloid leukemia, BCR/ABL-positive, not having achieved remission: Principal | ICD-10-CM

## 2021-09-07 ENCOUNTER — Ambulatory Visit: Admit: 2021-09-07 | Discharge: 2021-09-08 | Payer: MEDICARE | Attending: Adult Health | Primary: Adult Health

## 2021-09-07 ENCOUNTER — Encounter: Admit: 2021-09-07 | Discharge: 2021-09-08 | Payer: MEDICARE | Attending: Adult Health | Primary: Adult Health

## 2021-09-07 DIAGNOSIS — C9210 Chronic myeloid leukemia, BCR/ABL-positive, not having achieved remission: Principal | ICD-10-CM

## 2021-10-19 ENCOUNTER — Emergency Department: Admit: 2021-10-19 | Discharge: 2021-10-19 | Disposition: A | Discharge status: Home / Self Care | Payer: MEDICARE

## 2021-10-19 ENCOUNTER — Ambulatory Visit: Admit: 2021-10-19 | Discharge: 2021-10-19 | Disposition: A | Discharge status: Home / Self Care | Payer: MEDICARE

## 2021-10-19 DIAGNOSIS — U071 COVID-19: Principal | ICD-10-CM

## 2021-10-19 DIAGNOSIS — J069 Acute upper respiratory infection, unspecified: Principal | ICD-10-CM

## 2021-12-07 ENCOUNTER — Ambulatory Visit: Admit: 2021-12-07 | Discharge: 2021-12-08 | Payer: MEDICARE

## 2021-12-07 ENCOUNTER — Other Ambulatory Visit: Admit: 2021-12-07 | Discharge: 2021-12-08 | Payer: MEDICARE

## 2022-03-07 ENCOUNTER — Ambulatory Visit: Admit: 2022-03-07 | Payer: MEDICARE

## 2022-03-07 ENCOUNTER — Ambulatory Visit
Admit: 2022-03-07 | Payer: MEDICARE | Attending: Student in an Organized Health Care Education/Training Program | Primary: Student in an Organized Health Care Education/Training Program

## 2022-03-14 ENCOUNTER — Ambulatory Visit
Admit: 2022-03-14 | Discharge: 2022-03-14 | Payer: MEDICARE | Attending: Student in an Organized Health Care Education/Training Program | Primary: Student in an Organized Health Care Education/Training Program

## 2022-03-14 ENCOUNTER — Institutional Professional Consult (permissible substitution): Admit: 2022-03-14 | Discharge: 2022-03-14 | Payer: MEDICARE

## 2022-03-14 DIAGNOSIS — C9210 Chronic myeloid leukemia, BCR/ABL-positive, not having achieved remission: Principal | ICD-10-CM

## 2022-03-29 ENCOUNTER — Ambulatory Visit
Admit: 2022-03-29 | Discharge: 2022-04-03 | Disposition: A | Discharge status: Skilled Nursing Facility | Payer: MEDICARE | Source: Intra-hospital | Admitting: Medical

## 2022-03-29 ENCOUNTER — Ambulatory Visit: Admit: 2022-03-29 | Discharge: 2022-04-03 | Payer: MEDICARE

## 2022-06-13 ENCOUNTER — Ambulatory Visit
Admit: 2022-06-13 | Discharge: 2022-06-14 | Payer: MEDICARE | Attending: Student in an Organized Health Care Education/Training Program | Primary: Student in an Organized Health Care Education/Training Program

## 2022-06-13 ENCOUNTER — Other Ambulatory Visit: Admit: 2022-06-13 | Discharge: 2022-06-14 | Payer: MEDICARE

## 2022-06-23 DIAGNOSIS — C9210 Chronic myeloid leukemia, BCR/ABL-positive, not having achieved remission: Principal | ICD-10-CM

## 2022-06-23 DIAGNOSIS — I2699 Other pulmonary embolism without acute cor pulmonale: Principal | ICD-10-CM

## 2022-06-27 ENCOUNTER — Other Ambulatory Visit: Admit: 2022-06-27 | Discharge: 2022-06-28 | Payer: MEDICARE

## 2022-06-27 DIAGNOSIS — C9210 Chronic myeloid leukemia, BCR/ABL-positive, not having achieved remission: Principal | ICD-10-CM

## 2022-07-02 DIAGNOSIS — I2699 Other pulmonary embolism without acute cor pulmonale: Principal | ICD-10-CM

## 2022-07-10 DIAGNOSIS — C9210 Chronic myeloid leukemia, BCR/ABL-positive, not having achieved remission: Principal | ICD-10-CM

## 2022-07-10 MED ORDER — BOSUTINIB 400 MG TABLET
ORAL_TABLET | Freq: Every day | ORAL | 5 refills | 0 days | Status: CP
Start: 2022-07-10 — End: ?
  Filled 2022-07-25: qty 30, 30d supply, fill #0

## 2022-07-10 NOTE — Unmapped (Signed)
[facility] Specialty Medication Onboarding    Specialty Medication: Bosulif  Prior Authorization: Not Required   Financial Assistance: No - copay  <$25  Final Copay/Day Supply: $0 / 30    Insurance Restrictions: None     Notes to Pharmacist:   Credit Card on File: not applicable    The triage team has completed the benefits investigation and has determined that the patient is able to fill this medication at [facility]. Please contact the patient to complete the onboarding or follow up with the prescribing physician as needed.

## 2022-07-12 NOTE — Unmapped (Signed)
[facility]   Patient Onboarding/Medication Counseling    [facility] is a 57 y.o. female with CML who I am counseling today on initiation of therapy.  I am speaking to the patient's caregiver, [facility] at [facility] .    Was a Nurse, learning disability used for this call? No    Verified patient's date of birth / HIPAA.    Specialty medication(s) to be sent: Hematology/Oncology: Bosulif      Non-specialty medications/supplies to be sent: n/a      Medications not needed at this time: n/a         Bosulif (bosutinib)    Medication & Administration     Dosage: Take 1 tablets (400mg ) by mouth once daily    Administration: Administer with food. Swallow tablet whole, do not cut, crush, or break.    Adherence/Missed dose instructions: If a dose is missed beyond 12 hours, skip the dose and resume the usual dose the following day    Goals of Therapy     Prevent disease progression    Side Effects & Monitoring Parameters     Abdominal pain  Nausea, vomiting  Diarrhea  Common cold symptoms  Stuffy nose, sore throat, lack of appetite  Dizziness  Fatigue  Headache  Joint pain  Back pain    The following side effects should be reported to the provider:  Signs/symptoms of infection  Signs/symptoms of liver problems, like dark urine, fatigue, lack of appetits, nausea, abdominal pain, light colored stools, vomiting, or yellow skin or eyes  Signs/sympotoms of kidney problems, like unable to pass urine, blood in the urine, change in amount of urine passed, or weight gain  Passing a lot of urine  Trouble breathing  Weight gain  Chest pain  Swelling  Severe loss of strength and energy  Unusual bruising or bleeding  Signs of a significant reaction like wheezing, chest tirhgness, fever, itching, bad cough, blue skin color, seizures, or swelling of face, lips, or throat    Monitoring Parameters:  CBC with differential and platelets  Hepatic enzymes  Renal function  Diarrhea episodes  Fluid/edema status  Adherence   Pregnancy status    Contraindications, Warnings, & Precautions     Bone marrow suppression  Fluid retention/edema  GI toxicity: diarrhea, nausea, vomiting, and abdominal pain  Hepatotoxicity  Hypersensitivity  Pancreatitis  QT Prolongation  Renal toxicity   Use caution in patients with hepatic or renal impairment  Pregnancy/lactation - may cause fetal harm if administered during pregnancy. Breastfeeding is not recommended during therapy and for at least 2 weeks after last dose    Drug/Food Interactions     Medication list reviewed in Epic. The patient was instructed to inform the care team before taking any new medications or supplements.  Seroquel - QT-prolonging agents may enhance Qtc-prolonging effects of QT-prolonging agents - monitor for prolongation and ventricular arrhythmia .   Separate from antacid and H2 blocker doses by at least 2 hours - Pepcid  Avoid proton pump inhibitors  Avoid grapefruit and grapefruit juice  Avoid St John's Wort    Storage, Handling Precautions, & Disposal     Store at room temperature   This drug is considered hazardous and should be handled as little as possible.  If someone else helps with medication administration, they should wear gloves.  Keep out of reach of others including children and pets. Do not throw away or flush unused medication down the toilet or sink.  Current Medications (including OTC/herbals), Comorbidities and Allergies     Current Outpatient Medications   Medication Sig Dispense Refill    acetaminophen (TYLENOL) 325 MG tablet Take 1 tablet (325 mg total) by mouth every six (6) hours as needed for fever or pain.      allopurinoL (ZYLOPRIM) 100 MG tablet Take 1 tablet (100 mg total) by mouth daily.      bisacodyL (DULCOLAX) 5 mg EC tablet Take 2 tablets (10 mg total) by mouth daily as needed for constipation.      bosutinib 400 mg Tab Take 1 tablet (400 mg) by mouth daily. Administer with food. Swallow tablet whole; do not cut, crush, break, or chew. 30 tablet 5 clozapine (CLOZARIL) 100 MG tablet Take 1.5 tablets (150 mg total) by mouth daily. 150 mg in the AM and 300 mg at bedtime      diclofenac sodium (VOLTAREN) 1 % gel       diphenoxylate-atropine (LOMOTIL) 2.5-0.025 mg per tablet Take 1 tablet by mouth four (4) times a day as needed for diarrhea.      famotidine (PEPCID) 20 MG tablet Take 1 tablet (20 mg total) by mouth two (2) times a day.      hydrALAZINE (APRESOLINE) 10 MG tablet Take 1 tablet (10 mg total) by mouth Three (3) times a day.      lidocaine (LIDODERM) 5 % patch Place 1 patch on the skin daily. Apply to affected area for 12 hours only each day (then remove patch)      melatonin 5 mg cap Take 1 capsule by mouth nightly.      metFORMIN (GLUCOPHAGE) 1000 MG tablet Take 0.5 tablets (500 mg total) by mouth daily before breakfast. 1000 mg in the AM and 500 mg at bedtime      metoprolol tartrate (LOPRESSOR) 25 MG tablet Take 2 tablets (50 mg total) by mouth two (2) times a day.      multivitamin (TAB-A-VITE/THERAGRAN) per tablet Take 1 capsule by mouth daily.      ondansetron (ZOFRAN) 4 MG tablet Take 1 tablet (4 mg total) by mouth every eight (8) hours as needed for nausea.      PARoxetine (PAXIL) 30 MG tablet Take 1 tablet (30 mg total) by mouth every morning.      pravastatin (PRAVACHOL) 20 MG tablet Take 1 tablet (20 mg total) by mouth at bedtime.      QUEtiapine (SEROQUEL) 50 MG tablet Take 1 tablet (50 mg total) by mouth nightly.      saliva stimulant comb. no.3 (BIOTENE MOISTURIZING MOUTH) Spry 1 spray every two (2) hours as needed.      sertraline (ZOLOFT) 50 MG tablet Take 1 tablet (50 mg total) by mouth daily.      valproic acid (DEPAKENE) 250 mg capsule Take 4 capsules (1,000 mg total) by mouth in the morning and 4 capsules (1,000 mg total) in the evening.       No current facility-administered medications for this visit.       No Known Allergies    Patient Active Problem List   Diagnosis    Type II diabetes mellitus (CMS-HCC)    HTN (hypertension) Gout    GERD (gastroesophageal reflux disease)    Morbid obesity (CMS-HCC)    Osteoporosis    Schizoaffective disorder, bipolar type (CMS-HCC)    CML (chronic myelocytic leukemia) (CMS-HCC)    Acute pharyngitis    Sore throat    Dyslipidemia    Bilateral pulmonary embolism (CMS-HCC)  Reviewed and up to date in Epic.    Appropriateness of Therapy     Acute infections noted within Epic:  No active infections  Patient reported infection: None    Is medication and dose appropriate based on diagnosis and infection status? Yes    Prescription has been clinically reviewed: Yes      Baseline Quality of Life Assessment      How many days over the past month did your condition  keep you from your normal activities? For example, brushing your teeth or getting up in the morning. 0    Financial Information     Medication Assistance provided: None Required    Anticipated copay of $0 reviewed with patient. Verified delivery address.    Delivery Information     Scheduled delivery date: 07/26/22    Expected start date: 07/27/22      Medication will be delivered via Next Day Courier to the prescription address in [facility].  This shipment will not require a signature.      Explained the services we provide at [facility] and that each month we would call to set up refills.  Stressed importance of returning phone calls so that we could ensure they receive their medications in time each month.  Informed patient that we should be setting up refills 7-10 days prior to when they will run out of medication.  A pharmacist will reach out to perform a clinical assessment periodically.  Informed patient that a welcome packet, containing information about our pharmacy and other support services, a Notice of Privacy Practices, and a drug information handout will be sent.      The patient or caregiver noted above participated in the development of this care plan and knows that they can request review of or adjustments to the care plan at any time.      Patient or caregiver verbalized understanding of the above information as well as how to contact the pharmacy at [phone] option 4 with any questions/concerns.  The pharmacy is open Monday through Friday 8:30am-4:30pm.  A pharmacist is available 24/7 via pager to answer any clinical questions they may have.    Patient Specific Needs     Does the patient have any physical, cognitive, or cultural barriers? No    Does the patient have adequate living arrangements? (i.e. the ability to store and take their medication appropriately) Yes    Did you identify any home environmental safety or security hazards? No    Patient prefers to have medications discussed with  Caregiver     Is the patient or caregiver able to read and understand education materials at a high school level or above? Yes    Patient's primary language is  English     Is the patient high risk? Yes, patient is taking oral chemotherapy. Appropriateness of therapy as been assessed    SOCIAL DETERMINANTS OF HEALTH     At the [facility], we have learned that life circumstances - like trouble affording food, housing, utilities, or transportation can affect the health of many of our patients.   That is why we wanted to ask: are you currently experiencing any life circumstances that are negatively impacting your health and/or quality of life? No    Social Determinants of Health     Financial Resource Strain: Low Risk  (04/01/2018)    Overall Financial Resource Strain (CARDIA)     Difficulty of Paying Living Expenses: Not very hard  Internet Connectivity: Not on file   Food Insecurity: No Food Insecurity (04/01/2018)    Hunger Vital Sign     Worried About Running Out of Food in the Last Year: Never true     Ran Out of Food in the Last Year: Never true   Tobacco Use: Medium Risk (06/14/2022)    Patient History     Smoking Tobacco Use: Former     Smokeless Tobacco Use: Never     Passive Exposure: Not on file Housing/Utilities: Unknown (04/10/2020)    Housing/Utilities     Within the past 12 months, have you ever stayed: outside, in a car, in a tent, in an overnight shelter, or temporarily in someone else's home (i.e. couch-surfing)?: Patient refused     Are you worried about losing your housing?: Not on file     Within the past 12 months, have you been unable to get utilities (heat, electricity) when it was really needed?: Not on file   Alcohol Use: Not on file   Transportation Needs: No Transportation Needs (04/01/2018)    PRAPARE - Transportation     Lack of Transportation (Medical): No     Lack of Transportation (Non-Medical): No   Substance Use: Not on file   Health Literacy: Not on file (04/10/2020)   Physical Activity: Unknown (04/01/2018)    Exercise Vital Sign     Days of Exercise per Week: Patient declined     Minutes of Exercise per Session: Patient declined   Interpersonal Safety: Unknown (07/12/2022)    Interpersonal Safety     Unsafe Where You Currently Live: Not on file     Physically Hurt by Anyone: Not on file     Abused by Anyone: Not on file   Stress: Not on file   Intimate Partner Violence: Unknown (04/01/2018)    Humiliation, Afraid, Rape, and Kick questionnaire     Fear of Current or Ex-Partner: Patient declined     Emotionally Abused: Patient declined     Physically Abused: Patient declined     Sexually Abused: Patient declined   Depression: Not on file   Social Connections: Unknown (04/01/2018)    Social Connection and Isolation Panel [NHANES]     Frequency of Communication with Friends and Family: Patient declined     Frequency of Social Gatherings with Friends and Family: Patient declined     Attends Religious Services: Patient declined     Database administrator or Organizations: Patient declined     Attends Banker Meetings: Patient declined     Marital Status: Patient declined       Would you be willing to receive help with any of the needs that you have identified today? Not applicable Arianie Couse Vangie Bicker, PharmD  [facility] Specialty Pharmacist

## 2022-08-11 NOTE — Unmapped (Signed)
[facility] Follow-up    Patient Name: Kimberly Mathis  Patient Age: 57 y.o.  Encounter Date: 08/14/2022    Primary Care Provider:  Desiree Lucy, PA    Referring Physician:  Haig Prophet, PA  [name]  [place],  [place] [address]    Reason for visit:   F/u visit for [facility]    Assessment:  Kimberly Mathis is a 57 y.o. 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 She began treatment on imatinib in 12/2015 with optimal response and meeting appropriate treatment milestones, except her one year PCR, which was slightly above goal.  She has been in a MMR and MR4.0 since 05/2018 until Jan 2021 when she lost MMR d/t caregiver change and missing medication. She was able to achieve MMR again until Oct 2022. She has had stable transcripts since Dec 2022, until her most recent level in June 2023 when it was discovered that she was not receiving her medication daily at her home. Her level did improve to a more normal state, however despite her facility giving her the medication daily, per the [facility], her BCR-ABL transcripts stayed elevated at  0.551 %. A BCR-ABL mutational panel was drawn which did not reveal the presence of any new mutations. Therefore the decision was made to switch her to Bosutinib 400 mg. She presents today approximately 2 weeks after starting the medication for repeat labs and a CPP/APP visit.     Kimberly Mathis presents to clinic today alone, without a caregiver or aid.  She continues to complain of knee pain that has been longstanding. Otherwise denies any new shortness of breath or increased side effects from her new medication. [name] brought with her just a medication list without a MAR. However, on the medication list Imatinib and Bosutinib are both listed as medications that she currently takes. I called her facility and it was confirmed that she has been receiving both medications daily for about two weeks. I requested the full MAR from the past 3 months be sent to me. I counseled her facility that she should not be receiving both of these medications, just the Bosutinib. They have discontinued the Imatinib prescription. Will call later this week to confirm that it has been stopped. Fortunately, despite this her labs remain stable with only a mildly elevated WBC 11.5, but stable Hgb, Plt, and ANC. CMP is WNL.      I again re-iterated to the Kimberly Mathis the Director of Nursing, and the facility that she needs a CNA or Aid to present with her at all times for safety reasons. Although I continue to request this, she has still presented to every appointment alone over the past few months. Will make the patients guardian,  Kimberly Mathis aware of this, as it continues to be a safety concern.  I strongly advised and insisted that patient come to all of her appointments in the future with a caregiver or aid from her home as well as a full MAR from the past 3 months. RTC in 1 month for repeat labs and assessment ~ 6 weeks into taking Bosutinib.     History of PE:  She had an admission back in Kimberly Mathis 2024 for a lethargy and shortness of breath, and was diagnosed with  pulmonary emboli within subsegmental and segmental branches to the bilateral upper and lower lobes. She was discharged on 4/1 and has been stable on Eliquis  5 mg twice a day. She has a new patient appointment with Benign Hematology in October to determine duration.   - Continue Eliquis 5 mg twice a day    Plan and Recommendations:  - Continue Bosutinib 400 mg daily, STOP Imatinib   - Confirmed with facility that they have discontinued the prescription  - Discussed with facility to ensure that patient has an aid sent with her when coming to these appointments  - Will reach out to patient's guardian with updates and to inform her that the patient continues to come without an aid  - Recommended full and updated MAR from the past 3 months when patient comes to appointments    - Patients facility will fax over  - Continue Eliquis 5 mg twice daily for hx of PE, patient will follow up with benign hematology in October   - RTC in 4 weeks for repeat labs ~ 6 weeks into Bosutinib therapy    Dr. Vertell Limber was available    [name] PA-C  Physician Assistant   Hematology/Oncology Division  [facility]  08/14/2022    I personally spent 40 minutes face-to-face and non-face-to-face in the care of this patient, which includes all pre, intra, and post visit time on the date of service.    History of Present Illness:  Hematology/Oncology History Overview Note   Diagnosis: CML    SOKAL Score:    Presentation: asymptomatic; leukocytosis found on routine CBC    Presenting WBC Count: 17,400    Bone Marrow Biopsy: not performed as patient unable to tolerate procedure.    Cytogenetics:  Abnormal FISH: An interphase FISH assay shows an abnormal signal pattern consistent with BCR/ABL1 fusion in 44% of the 100 cells scored. Of note, these cells contain an atypical abnormal signal pattern consistent with BCR/ABL1 rearrangement and loss of the ABL1/ASS1 region.    Treatment:  Imatinib 400 mg daily- 12/31/2015    12/31/15:  BCR-ABL1 p210 transcripts were detected at a level of 9.511 IS% ratio in blood.  BCR-ABL1 p190 transcripts were detected at a level of 3 in 100,000 cells in blood.    03/23/16:  BCR-ABL1 p210 transcripts were detected at a level of 0.702 IS % ratio in blood.    Normal FISH:  A BCR/ABL1 interphase FISH assay for the previously documented 9;22 translocation shows a normal signal pattern in 100% of the 200 nuclei scored     06/15/16:  BCR-ABL1 p210 transcripts were detected at a level of 0.272 IS % ratio in blood.    09/2016:  0.298 IS %    12/18: 0.241 IS%    03/22/17: 0.072%  06/21/17: 0.052%  09/27/17: 0.125%  01/03/18: 0.040%  05/28/18: 0.008%  09/13/18: 0.003%  01/14/19: 0.181%  02/05/19: 0.149%  04/10/19: 0.037%  09/03/19: 0.040%  12/04/19: 0.004%  03/04/20: 0.032%  06/30/20: 0.018%  10/07/20: 0.307%  12/23/20: 0.351%  06/02/21: 3.311%  09/07/2021:  0.887%  12/07/2021: 2.530%  03/14/2022: 0. 496%  06/13/2022: 0.551%    07/28/2022: Started Bosutinib 400 mg (was being given Imatinib 400 mg as well per error from her facility)    08/14/2022: STOPPED Imatinib 400 mg (given instructions to continue Bosutinib 400 mg )     CML (chronic myelocytic leukemia) (CMS-HCC)   12/28/2015 Initial Diagnosis    CML (chronic myelocytic leukemia) (RAF-HCC)         Interval History:  Ms. Crosthwaite presents to clinic today alone without an aid. She reports that she is doing okay besides her continued  knee and back pain. Reports continued fatigue, but no worse than baseline. Denies any shortness of breath, feels her breathing has been okay.  She denies any n/v/d. Denies any new cough. Energy is stable. History difficult to obtain as patient presents alone without an aid or caregiver.     Past Medical, Surgical and Family History were reviewed and pertinent updates were made in the Electronic Medical Record    Review of Systems:  All other systems reviewed were negative.     ECOG Performance Status: 2    Past Medical History:  Past Medical History:   Diagnosis Date    GERD (gastroesophageal reflux disease)     Gout     HTN (hypertension)     Morbid obesity (CMS-HCC)     Osteoporosis     Schizoaffective disorder, bipolar type (CMS-HCC)     Type II diabetes mellitus (CMS-HCC)        Medications:  Current Outpatient Medications   Medication Sig Dispense Refill    acetaminophen (TYLENOL) 325 MG tablet Take 1 tablet (325 mg total) by mouth every six (6) hours as needed for fever or pain.      allopurinoL (ZYLOPRIM) 100 MG tablet Take 1 tablet (100 mg total) by mouth daily.      bisacodyL (DULCOLAX) 5 mg EC tablet Take 2 tablets (10 mg total) by mouth daily as needed for constipation.      bosutinib 400 mg Tab Take 1 tablet (400 mg) by mouth daily. Administer with food. Swallow tablet whole; do not cut, crush, break, or chew. 30 tablet 5    clozapine (CLOZARIL) 100 MG tablet Take 1.5 tablets (150 mg total) by mouth daily. 150 mg in the AM and 300 mg at bedtime      famotidine (PEPCID) 20 MG tablet Take 1 tablet (20 mg total) by mouth two (2) times a day.      hydrALAZINE (APRESOLINE) 10 MG tablet Take 1 tablet (10 mg total) by mouth Three (3) times a day.      melatonin 5 mg cap Take 1 capsule by mouth nightly.      metFORMIN (GLUCOPHAGE) 1000 MG tablet Take 0.5 tablets (500 mg total) by mouth daily before breakfast. 1000 mg in the AM and 500 mg at bedtime      metoprolol tartrate (LOPRESSOR) 25 MG tablet Take 2 tablets (50 mg total) by mouth two (2) times a day.      multivitamin (TAB-A-VITE/THERAGRAN) per tablet Take 1 capsule by mouth daily.      ondansetron (ZOFRAN) 4 MG tablet Take 1 tablet (4 mg total) by mouth every eight (8) hours as needed for nausea.      pravastatin (PRAVACHOL) 20 MG tablet Take 1 tablet (20 mg total) by mouth at bedtime.      QUEtiapine (SEROQUEL) 50 MG tablet Take 1 tablet (50 mg total) by mouth nightly.      saliva stimulant comb. no.3 (BIOTENE MOISTURIZING MOUTH) Spry 1 spray every two (2) hours as needed.      sertraline (ZOLOFT) 50 MG tablet Take 1 tablet (50 mg total) by mouth daily.      valproic acid (DEPAKENE) 250 mg capsule Take 4 capsules (1,000 mg total) by mouth in the morning and 4 capsules (1,000 mg total) in the evening.      apixaban (ELIQUIS) 5 mg Tab Take 1 tablet (5 mg total) by mouth two (2) times a day.      diclofenac sodium (VOLTAREN) 1 %  gel       diphenoxylate-atropine (LOMOTIL) 2.5-0.025 mg per tablet Take 1 tablet by mouth four (4) times a day as needed for diarrhea. (Patient not taking: Reported on 08/14/2022)      lidocaine (LIDODERM) 5 % patch Place 1 patch on the skin daily. Apply to affected area for 12 hours only each day (then remove patch) (Patient not taking: Reported on 08/14/2022)      PARoxetine (PAXIL) 30 MG tablet Take 1 tablet (30 mg total) by mouth every morning. (Patient not taking: Reported on 08/14/2022)       No current facility-administered medications for this visit.     Vital Signs:  BSA: There is no height or weight on file to calculate BSA.  Vitals:    08/14/22 0942   BP: 135/76   Pulse: 98   Resp: 16   Temp: 36.5 ??C (97.7 ??F)   SpO2: 98%       Physical Exam:  Constitutional: Obese woman sitting in wheelchair, no acute distress  Eyes: PERRL. No scleral icterus or conjunctival injection.  Cardiovascular:  Regular rate, regular rhythm  S1, S2.  No murmurs, gallops or rubs.  Respiratory:  Breathing is unlabored.  CTAB. No rales, ronchi or crackles.  Breath sounds mildly diminished throughout all lung fields.   GI:  No distention or pain on palpation.  Bowel sounds are present.  .  Musculoskeletal:   strength equal bilaterally  Skin:  No rashes, petechiae or purpura.  grossly intact.   Neurologic:  alert and oriented to person/place, forced speech  Psychiatric: tangential requiring frequent re-direction    Relevant Laboratory, radiology and pathology results:  Lab on 08/14/2022   Component Date Value Ref Range Status    Sodium 08/14/2022 140  135 - 145 mmol/L Final    Potassium 08/14/2022 3.7  3.4 - 4.8 mmol/L Final    Chloride 08/14/2022 104  98 - 107 mmol/L Final    CO2 08/14/2022 30.0  20.0 - 31.0 mmol/L Final    Anion Gap 08/14/2022 6  5 - 14 mmol/L Final    BUN 08/14/2022 7 (L)  9 - 23 mg/dL Final    Creatinine 40/98/1191 0.61  0.55 - 1.02 mg/dL Final    BUN/Creatinine Ratio 08/14/2022 11   Final    eGFR CKD-EPI (2021) Female 08/14/2022 >90  >=60 mL/min/1.64m2 Final    eGFR calculated with CKD-EPI 2021 equation in accordance with [employer] and [employer] of Nephrology Task Force recommendations.    Glucose 08/14/2022 122  70 - 179 mg/dL Final    Calcium 47/82/9562 9.0  8.7 - 10.4 mg/dL Final    Albumin 13/08/6576 4.0  3.4 - 5.0 g/dL Final    Total Protein 08/14/2022 8.4 (H)  5.7 - 8.2 g/dL Final    Total Bilirubin 08/14/2022 0.3  0.3 - 1.2 mg/dL Final    Mathis 46/96/2952 29  <=34 U/L Final    ALT 08/14/2022 28  10 - 49 U/L Final    Alkaline Phosphatase 08/14/2022 69  46 - 116 U/L Final    WBC 08/14/2022 11.5 (H)  3.6 - 11.2 10*9/L Final    RBC 08/14/2022 3.92 (L)  3.95 - 5.13 10*12/L Final    HGB 08/14/2022 11.5  11.3 - 14.9 g/dL Final    HCT 84/13/2440 35.3  34.0 - 44.0 % Final    MCV 08/14/2022 90.1  77.6 - 95.7 fL Final    MCH 08/14/2022 29.5  25.9 - 32.4 pg Final  MCHC 08/14/2022 32.7  32.0 - 36.0 g/dL Final    RDW 03/47/4259 15.9 (H)  12.2 - 15.2 % Final    MPV 08/14/2022 8.7  6.8 - 10.7 fL Final    Platelet 08/14/2022 288  150 - 450 10*9/L Final    Neutrophils % 08/14/2022 53.3  % Final    Lymphocytes % 08/14/2022 36.9  % Final    Monocytes % 08/14/2022 7.9  % Final    Eosinophils % 08/14/2022 1.4  % Final    Basophils % 08/14/2022 0.5  % Final    Absolute Neutrophils 08/14/2022 6.1  1.8 - 7.8 10*9/L Final    Absolute Lymphocytes 08/14/2022 4.2 (H)  1.1 - 3.6 10*9/L Final    Absolute Monocytes 08/14/2022 0.9 (H)  0.3 - 0.8 10*9/L Final    Absolute Eosinophils 08/14/2022 0.2  0.0 - 0.5 10*9/L Final    Absolute Basophils 08/14/2022 0.1  0.0 - 0.1 10*9/L Final

## 2022-08-14 ENCOUNTER — Ambulatory Visit: Admit: 2022-08-14 | Discharge: 2022-08-14 | Payer: MEDICARE

## 2022-08-14 ENCOUNTER — Other Ambulatory Visit: Admit: 2022-08-14 | Discharge: 2022-08-14 | Payer: MEDICARE

## 2022-08-14 ENCOUNTER — Ambulatory Visit
Admit: 2022-08-14 | Discharge: 2022-08-14 | Payer: MEDICARE | Attending: Student in an Organized Health Care Education/Training Program | Primary: Student in an Organized Health Care Education/Training Program

## 2022-08-14 DIAGNOSIS — C9210 Chronic myeloid leukemia, BCR/ABL-positive, not having achieved remission: Principal | ICD-10-CM

## 2022-08-14 LAB — COMPREHENSIVE METABOLIC PANEL
ALBUMIN: 4 g/dL (ref 3.4–5.0)
ALKALINE PHOSPHATASE: 69 U/L (ref 46–116)
ALT (SGPT): 28 U/L (ref 10–49)
ANION GAP: 6 mmol/L (ref 5–14)
AST (SGOT): 29 U/L (ref <=34)
BILIRUBIN TOTAL: 0.3 mg/dL (ref 0.3–1.2)
BLOOD UREA NITROGEN: 7 mg/dL — ABNORMAL LOW (ref 9–23)
BUN / CREAT RATIO: 11
CALCIUM: 9 mg/dL (ref 8.7–10.4)
CHLORIDE: 104 mmol/L (ref 98–107)
CO2: 30 mmol/L (ref 20.0–31.0)
CREATININE: 0.61 mg/dL
EGFR CKD-EPI (2021) FEMALE: >90 mL/min/{1.73_m2} (ref >=60)
GLUCOSE RANDOM: 122 mg/dL (ref 70–179)
POTASSIUM: 3.7 mmol/L (ref 3.4–4.8)
PROTEIN TOTAL: 8.4 g/dL — ABNORMAL HIGH (ref 5.7–8.2)
SODIUM: 140 mmol/L (ref 135–145)

## 2022-08-14 LAB — CBC W/ AUTO DIFF
BASOPHILS ABSOLUTE COUNT: 0.1 10*9/L (ref 0.0–0.1)
BASOPHILS RELATIVE PERCENT: 0.5 %
EOSINOPHILS ABSOLUTE COUNT: 0.2 10*9/L (ref 0.0–0.5)
EOSINOPHILS RELATIVE PERCENT: 1.4 %
HEMATOCRIT: 35.3 % (ref 34.0–44.0)
HEMOGLOBIN: 11.5 g/dL (ref 11.3–14.9)
LYMPHOCYTES ABSOLUTE COUNT: 4.2 10*9/L — ABNORMAL HIGH (ref 1.1–3.6)
LYMPHOCYTES RELATIVE PERCENT: 36.9 %
MEAN CORPUSCULAR HEMOGLOBIN CONC: 32.7 g/dL (ref 32.0–36.0)
MEAN CORPUSCULAR HEMOGLOBIN: 29.5 pg (ref 25.9–32.4)
MEAN CORPUSCULAR VOLUME: 90.1 fL (ref 77.6–95.7)
MEAN PLATELET VOLUME: 8.7 fL (ref 6.8–10.7)
MONOCYTES ABSOLUTE COUNT: 0.9 10*9/L — ABNORMAL HIGH (ref 0.3–0.8)
MONOCYTES RELATIVE PERCENT: 7.9 %
NEUTROPHILS ABSOLUTE COUNT: 6.1 10*9/L (ref 1.8–7.8)
NEUTROPHILS RELATIVE PERCENT: 53.3 %
PLATELET COUNT: 288 10*9/L (ref 150–450)
RED BLOOD CELL COUNT: 3.92 10*12/L — ABNORMAL LOW (ref 3.95–5.13)
RED CELL DISTRIBUTION WIDTH: 15.9 % — ABNORMAL HIGH (ref 12.2–15.2)
WBC ADJUSTED: 11.5 10*9/L — ABNORMAL HIGH (ref 3.6–11.2)

## 2022-08-14 NOTE — Unmapped (Addendum)
Kimberly Mathis is a 57 y.o. female with CML who I am seeing in clinic today for oral chemotherapy monitoring    Encounter Date: 08/14/2022    Current Treatment: bosutinib 400 mg PO daily (started 07/27/22)     Medication Access: getting filled at [facility]; $0 copay with Medicaid; being delivered directly to assisted living facility [facility])    Interval History: Ms. Padelford has been doing well since starting bosutinib. Unable to review MAR but per med list it appears she has transition to bosutinib though may also be on imatinib. But she reports no adverse effects related to bosutinib. She does say her back, knees, and legs are bothering her in terms of pain. Sometimes she feels nauseous. Otherwise no issues. She endorses taking her medication daily (with assistance from staff at [facility] where she resides).     Labs: WBC 11.5; ANC 6.1; Hgb 11.5; PLT 288; CMP stable    Oncologic History:  Hematology/Oncology History Overview Note   Diagnosis: CML    SOKAL Score:    Presentation: asymptomatic; leukocytosis found on routine CBC    Presenting WBC Count: 17,400    Bone Marrow Biopsy: not performed as patient unable to tolerate procedure.    Cytogenetics:  Abnormal FISH: An interphase FISH assay shows an abnormal signal pattern consistent with BCR/ABL1 fusion in 44% of the 100 cells scored. Of note, these cells contain an atypical abnormal signal pattern consistent with BCR/ABL1 rearrangement and loss of the ABL1/ASS1 region.    Treatment:  Imatinib 400 mg daily- 12/31/2015    12/31/15:  BCR-ABL1 p210 transcripts were detected at a level of 9.511 IS% ratio in blood.  BCR-ABL1 p190 transcripts were detected at a level of 3 in 100,000 cells in blood.    03/23/16:  BCR-ABL1 p210 transcripts were detected at a level of 0.702 IS % ratio in blood.    Normal FISH:  A BCR/ABL1 interphase FISH assay for the previously documented 9;22 translocation shows a normal signal pattern in 100% of the 200 nuclei scored     06/15/16:  BCR-ABL1 p210 transcripts were detected at a level of 0.272 IS % ratio in blood.    09/2016:  0.298 IS %    12/18: 0.241 IS%    03/22/17: 0.072%  06/21/17: 0.052%  09/27/17: 0.125%  01/03/18: 0.040%  05/28/18: 0.008%  09/13/18: 0.003%  01/14/19: 0.181%  02/05/19: 0.149%  04/10/19: 0.037%  09/03/19: 0.040%  12/04/19: 0.004%  03/04/20: 0.032%  06/30/20: 0.018%  10/07/20: 0.307%  12/23/20: 0.351%  06/02/21: 3.311%  09/07/2021:  0.887%  12/07/2021: 2.530%  03/14/2022: 0. 496%  06/13/2022: 0.551%    07/28/2022: Started Bosutinib 400 mg (was being given Imatinib 400 mg as well per error from her facility)    08/14/2022: STOPPED Imatinib 400 mg (given instructions to continue Bosutinib 400 mg )     CML (chronic myelocytic leukemia) (CMS-HCC)   12/28/2015 Initial Diagnosis    CML (chronic myelocytic leukemia) (RAF-HCC)       Weight and Vitals:  Wt Readings from Last 3 Encounters:   03/29/22 (!) 147.4 kg (325 lb)   10/19/21 (!) 146.1 kg (322 lb)   07/24/20 (!) 145.1 kg (319 lb 14.2 oz)     Temp Readings from Last 3 Encounters:   08/14/22 36.5 ??C (97.7 ??F) (Temporal)   06/13/22 36.8 ??C (98.2 ??F) (Oral)   04/03/22 36.5 ??C (97.7 ??F) (Axillary)     BP Readings from Last 3 Encounters:   08/14/22 135/76  06/13/22 130/92   04/03/22 97/63     Pulse Readings from Last 3 Encounters:   08/14/22 98   06/13/22 118   04/03/22 85     Pertinent Labs:  Lab on 08/14/2022   Component Date Value Ref Range Status    Sodium 08/14/2022 140  135 - 145 mmol/L Final    Potassium 08/14/2022 3.7  3.4 - 4.8 mmol/L Final    Chloride 08/14/2022 104  98 - 107 mmol/L Final    CO2 08/14/2022 30.0  20.0 - 31.0 mmol/L Final    Anion Gap 08/14/2022 6  5 - 14 mmol/L Final    BUN 08/14/2022 7 (L)  9 - 23 mg/dL Final    Creatinine 40/34/7425 0.61  0.55 - 1.02 mg/dL Final    BUN/Creatinine Ratio 08/14/2022 11   Final    eGFR CKD-EPI (2021) Female 08/14/2022 >90  >=60 mL/min/1.36m2 Final    eGFR calculated with CKD-EPI 2021 equation in accordance with [employer] and [employer] of Nephrology Task Force recommendations.    Glucose 08/14/2022 122  70 - 179 mg/dL Final    Calcium 95/63/8756 9.0  8.7 - 10.4 mg/dL Final    Albumin 43/32/9518 4.0  3.4 - 5.0 g/dL Final    Total Protein 08/14/2022 8.4 (H)  5.7 - 8.2 g/dL Final    Total Bilirubin 08/14/2022 0.3  0.3 - 1.2 mg/dL Final    AST 84/16/6063 29  <=34 U/L Final    ALT 08/14/2022 28  10 - 49 U/L Final    Alkaline Phosphatase 08/14/2022 69  46 - 116 U/L Final    WBC 08/14/2022 11.5 (H)  3.6 - 11.2 10*9/L Final    RBC 08/14/2022 3.92 (L)  3.95 - 5.13 10*12/L Final    HGB 08/14/2022 11.5  11.3 - 14.9 g/dL Final    HCT 01/60/1093 35.3  34.0 - 44.0 % Final    MCV 08/14/2022 90.1  77.6 - 95.7 fL Final    MCH 08/14/2022 29.5  25.9 - 32.4 pg Final    MCHC 08/14/2022 32.7  32.0 - 36.0 g/dL Final    RDW 23/55/7322 15.9 (H)  12.2 - 15.2 % Final    MPV 08/14/2022 8.7  6.8 - 10.7 fL Final    Platelet 08/14/2022 288  150 - 450 10*9/L Final    Neutrophils % 08/14/2022 53.3  % Final    Lymphocytes % 08/14/2022 36.9  % Final    Monocytes % 08/14/2022 7.9  % Final    Eosinophils % 08/14/2022 1.4  % Final    Basophils % 08/14/2022 0.5  % Final    Absolute Neutrophils 08/14/2022 6.1  1.8 - 7.8 10*9/L Final    Absolute Lymphocytes 08/14/2022 4.2 (H)  1.1 - 3.6 10*9/L Final    Absolute Monocytes 08/14/2022 0.9 (H)  0.3 - 0.8 10*9/L Final    Absolute Eosinophils 08/14/2022 0.2  0.0 - 0.5 10*9/L Final    Absolute Basophils 08/14/2022 0.1  0.0 - 0.1 10*9/L Final     Current Medications:   Current Outpatient Medications   Medication Sig Dispense Refill    acetaminophen (TYLENOL) 325 MG tablet Take 1 tablet (325 mg total) by mouth every six (6) hours as needed for fever or pain.      allopurinoL (ZYLOPRIM) 100 MG tablet Take 1 tablet (100 mg total) by mouth daily.      apixaban (ELIQUIS) 5 mg Tab Take 1 tablet (5 mg total) by mouth two (2) times a  day.      bisacodyL (DULCOLAX) 5 mg EC tablet Take 2 tablets (10 mg total) by mouth daily as needed for constipation.      bosutinib 400 mg Tab Take 1 tablet (400 mg) by mouth daily. Administer with food. Swallow tablet whole; do not cut, crush, break, or chew. 30 tablet 5    clozapine (CLOZARIL) 100 MG tablet Take 1.5 tablets (150 mg total) by mouth daily. 150 mg in the AM and 300 mg at bedtime      diclofenac sodium (VOLTAREN) 1 % gel       famotidine (PEPCID) 20 MG tablet Take 1 tablet (20 mg total) by mouth two (2) times a day.      hydrALAZINE (APRESOLINE) 10 MG tablet Take 1 tablet (10 mg total) by mouth Three (3) times a day.      melatonin 5 mg cap Take 1 capsule by mouth nightly.      metFORMIN (GLUCOPHAGE) 1000 MG tablet Take 0.5 tablets (500 mg total) by mouth daily before breakfast. 1000 mg in the AM and 500 mg at bedtime      metoprolol tartrate (LOPRESSOR) 25 MG tablet Take 2 tablets (50 mg total) by mouth two (2) times a day.      multivitamin (TAB-A-VITE/THERAGRAN) per tablet Take 1 capsule by mouth daily.      ondansetron (ZOFRAN) 4 MG tablet Take 1 tablet (4 mg total) by mouth every eight (8) hours as needed for nausea.      pravastatin (PRAVACHOL) 20 MG tablet Take 1 tablet (20 mg total) by mouth at bedtime.      QUEtiapine (SEROQUEL) 50 MG tablet Take 1 tablet (50 mg total) by mouth nightly.      saliva stimulant comb. no.3 (BIOTENE MOISTURIZING MOUTH) Spry 1 spray every two (2) hours as needed.      sertraline (ZOLOFT) 50 MG tablet Take 1 tablet (50 mg total) by mouth daily.      valproic acid (DEPAKENE) 250 mg capsule Take 4 capsules (1,000 mg total) by mouth in the morning and 4 capsules (1,000 mg total) in the evening.      diphenoxylate-atropine (LOMOTIL) 2.5-0.025 mg per tablet Take 1 tablet by mouth four (4) times a day as needed for diarrhea. (Patient not taking: Reported on 08/14/2022)      lidocaine (LIDODERM) 5 % patch Place 1 patch on the skin daily. Apply to affected area for 12 hours only each day (then remove patch) (Patient not taking: Reported on 08/14/2022)      PARoxetine (PAXIL) 30 MG tablet Take 1 tablet (30 mg total) by mouth every morning. (Patient not taking: Reported on 08/14/2022)       No current facility-administered medications for this visit.     Allergies: No Known Allergies    Adherence: No concerns noted but patient's medications are managed by staff at Assisted Living Facility; requested MAR from facility    Drug Interactions: None identified (counts stable on clozapine and TKI)    Assessment: Ms.Shrieves is a 57 y.o. female with CML, previously on imatinib. Due to suboptimal BCR ABL response on imatinib, she was recently switched to bosutinib. Mutation analysis negative. Last BCR ABL 0.551 on 06/13/22. She is over two weeks into bosutinib therapy and appears to be tolerating well. Labs appropriate.     Plan:   1) CML  -Continue bosutinib 400 mg PO daily  -Will confirm facility has STOPPED imatinib  -Repeat BCR ABL 3 mo after start of bosutinib  I spent 15 minutes with Ms.Oguinn in direct patient care.    Ronnald Collum, PharmD, BCOP, CPP  Hematology/Oncology Clinical Pharmacist  Pager [phone]

## 2022-08-14 NOTE — Unmapped (Signed)
It was great to see you today. Labs are stable.    Continue the Bosutinib 400 mg but DO NOT GIVE Imatinib 400 mg with this. This needs to stop since she is now on Bosutinib.     I will need to see her back in 4 weeks for repeat labs and a visit.     If you have questions or concerns, you may call the Nurse call line at [phone].    Lab on 08/14/2022   Component Date Value Ref Range Status    Sodium 08/14/2022 140  135 - 145 mmol/L Final    Potassium 08/14/2022 3.7  3.4 - 4.8 mmol/L Final    Chloride 08/14/2022 104  98 - 107 mmol/L Final    CO2 08/14/2022 30.0  20.0 - 31.0 mmol/L Final    Anion Gap 08/14/2022 6  5 - 14 mmol/L Final    BUN 08/14/2022 7 (L)  9 - 23 mg/dL Final    Creatinine 09/81/1914 0.61  0.55 - 1.02 mg/dL Final    BUN/Creatinine Ratio 08/14/2022 11   Final    eGFR CKD-EPI (2021) Female 08/14/2022 >90  >=60 mL/min/1.75m2 Final    eGFR calculated with CKD-EPI 2021 equation in accordance with [employer] and [employer] of Nephrology Task Force recommendations.    Glucose 08/14/2022 122  70 - 179 mg/dL Final    Calcium 78/29/5621 9.0  8.7 - 10.4 mg/dL Final    Albumin 30/86/5784 4.0  3.4 - 5.0 g/dL Final    Total Protein 08/14/2022 8.4 (H)  5.7 - 8.2 g/dL Final    Total Bilirubin 08/14/2022 0.3  0.3 - 1.2 mg/dL Final    AST 69/62/9528 29  <=34 U/L Final    ALT 08/14/2022 28  10 - 49 U/L Final    Alkaline Phosphatase 08/14/2022 69  46 - 116 U/L Final    WBC 08/14/2022 11.5 (H)  3.6 - 11.2 10*9/L Final    RBC 08/14/2022 3.92 (L)  3.95 - 5.13 10*12/L Final    HGB 08/14/2022 11.5  11.3 - 14.9 g/dL Final    HCT 41/32/4401 35.3  34.0 - 44.0 % Final    MCV 08/14/2022 90.1  77.6 - 95.7 fL Final    MCH 08/14/2022 29.5  25.9 - 32.4 pg Final    MCHC 08/14/2022 32.7  32.0 - 36.0 g/dL Final    RDW 02/72/5366 15.9 (H)  12.2 - 15.2 % Final    MPV 08/14/2022 8.7  6.8 - 10.7 fL Final    Platelet 08/14/2022 288  150 - 450 10*9/L Final    Neutrophils % 08/14/2022 53.3  % Final    Lymphocytes % 08/14/2022 36.9  % Final    Monocytes % 08/14/2022 7.9  % Final    Eosinophils % 08/14/2022 1.4  % Final    Basophils % 08/14/2022 0.5  % Final    Absolute Neutrophils 08/14/2022 6.1  1.8 - 7.8 10*9/L Final    Absolute Lymphocytes 08/14/2022 4.2 (H)  1.1 - 3.6 10*9/L Final    Absolute Monocytes 08/14/2022 0.9 (H)  0.3 - 0.8 10*9/L Final    Absolute Eosinophils 08/14/2022 0.2  0.0 - 0.5 10*9/L Final    Absolute Basophils 08/14/2022 0.1  0.0 - 0.1 10*9/L Final

## 2022-09-18 DIAGNOSIS — C9210 Chronic myeloid leukemia, BCR/ABL-positive, not having achieved remission: Principal | ICD-10-CM

## 2022-09-18 NOTE — Unmapped (Signed)
[facility] Follow-up    Patient Name: Kimberly Mathis  Patient Age: 57 y.o.  Encounter Date: 09/21/2022    Primary Care Provider:  Desiree Lucy, PA    Referring Physician:  Oneita Hurt, None Per Patient  [address] [place],  [place] [address]    Reason for visit:   F/u visit for [facility]    Assessment:  Ms. Kimberly Mathis is a 57 y.o. 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 She began treatment on imatinib in 12/2015 with optimal response and meeting appropriate treatment milestones, except her one year PCR, which was slightly above goal.  She has been in a MMR and MR4.0 since 05/2018 until Jan 2021 when she lost MMR d/t caregiver change and missing medication. She was able to achieve MMR again until Oct 2022. She has had stable transcripts since Dec 2022, until her most recent level in June 2023 when it was discovered that she was not receiving her medication daily at her home. Her level did improve to a more normal state, however despite her facility giving her the medication daily, per the [facility], her BCR-ABL transcripts stayed elevated at  0.551 %. A BCR-ABL mutational panel was drawn which did not reveal the presence of any new mutations. Therefore the decision was made to switch her to Bosutinib 400 mg. She presents today approximately 6 weeks after starting the medication for repeat labs and a CPP/APP visit.     Ms. Kimberly Mathis presents to clinic today alone, without a caregiver or aid.  She continues to complain of knee pain that has been longstanding. She reports some mild intermittent stomach discomfort, but denies any nausea/vomiting, or diarrhea/constipation. Otherwise denies any new shortness of breath or increased side effects from her new medication. Ms. Kimberly Mathis brought with her just a medication list without a MAR. I will call her facility to review her medications and have a recent [facility] sent to [place] to ensure that she is receiving her medication everyday.I did confirm that they are only giving her Bosutinib and not Imatinib, which they were doing for a couple of weeks.  On lab review today. CBC is fairly stable with WBC 12.6, Hgb 11.5, Plt 268, ANC 6.8. CMP is WNL. BCR-ABL is drawn today and is pending as it has been 3 months since her last value.     I again re-iterated to the April the Director of Nursing, and the facility that she needs a CNA or Aid to present with her at all times for safety reasons. Although I continue to request this, she has still presented to every appointment alone over the past few months. Will make the patients guardian,  [name] aware of this, as it continues to be a safety concern.  I strongly advised and insisted that patient come to all of her appointments in the future with a caregiver or aid from her home as well as a full MAR from the past 3 months. RTC in 6 weeks to check in after 3 months on Bosutinib.     History of PE:  She had an admission back in April 2024 for a lethargy and shortness of breath, and was diagnosed with  pulmonary emboli within subsegmental and segmental branches to the bilateral upper and lower lobes. She was discharged on 4/1 and has been stable on Eliquis 5 mg twice a day. She has a new patient appointment with Benign  Hematology in October to determine duration.   - Continue Eliquis 5 mg twice a day    Plan and Recommendations:  - Continue Bosutinib 400 mg daily  - Discussed with facility to ensure that patient has an aid sent with her when coming to these appointments  - Will reach out to patient's guardian with updates and to inform her that the patient continues to come without an aid  - Recommended full and updated MAR from the past 3 months when patient comes to appointments    - Patients facility will fax over  - Continue Eliquis 5 mg twice daily for hx of PE, patient will follow up with benign hematology in October   - RTC in 6 weeks after 3 months on Bosutinib    Dr. Vertell Limber was available    [name] PA-C  Physician Assistant   Hematology/Oncology Division  [facility]  09/21/2022    I personally spent 40 minutes face-to-face and non-face-to-face in the care of this patient, which includes all pre, intra, and post visit time on the date of service.    History of Present Illness:  Hematology/Oncology History Overview Note   Diagnosis: CML    SOKAL Score:    Presentation: asymptomatic; leukocytosis found on routine CBC    Presenting WBC Count: 17,400    Bone Marrow Biopsy: not performed as patient unable to tolerate procedure.    Cytogenetics:  Abnormal FISH: An interphase FISH assay shows an abnormal signal pattern consistent with BCR/ABL1 fusion in 44% of the 100 cells scored. Of note, these cells contain an atypical abnormal signal pattern consistent with BCR/ABL1 rearrangement and loss of the ABL1/ASS1 region.    Treatment:  Imatinib 400 mg daily- 12/31/2015    12/31/15:  BCR-ABL1 p210 transcripts were detected at a level of 9.511 IS% ratio in blood.  BCR-ABL1 p190 transcripts were detected at a level of 3 in 100,000 cells in blood.    03/23/16:  BCR-ABL1 p210 transcripts were detected at a level of 0.702 IS % ratio in blood.    Normal FISH:  A BCR/ABL1 interphase FISH assay for the previously documented 9;22 translocation shows a normal signal pattern in 100% of the 200 nuclei scored     06/15/16:  BCR-ABL1 p210 transcripts were detected at a level of 0.272 IS % ratio in blood.    09/2016:  0.298 IS %    12/18: 0.241 IS%    03/22/17: 0.072%  06/21/17: 0.052%  09/27/17: 0.125%  01/03/18: 0.040%  05/28/18: 0.008%  09/13/18: 0.003%  01/14/19: 0.181%  02/05/19: 0.149%  04/10/19: 0.037%  09/03/19: 0.040%  12/04/19: 0.004%  03/04/20: 0.032%  06/30/20: 0.018%  10/07/20: 0.307%  12/23/20: 0.351%  06/02/21: 3.311%  09/07/2021:  0.887%  12/07/2021: 2.530%  03/14/2022: 0. 496%  06/13/2022: 0.551%    07/28/2022: Started Bosutinib 400 mg (was being given Imatinib 400 mg as well per error from her facility)    08/14/2022: STOPPED Imatinib 400 mg (given instructions to continue Bosutinib 400 mg )     CML (chronic myelocytic leukemia) (CMS-HCC)   12/28/2015 Initial Diagnosis    CML (chronic myelocytic leukemia) (RAF-HCC)         Interval History:  Ms. Kimberly Mathis presents to clinic today alone without an aid. She reports that she is doing okay besides her continued knee and back pain.She has been having some increased stomach pains in her left lower side. States they are only intermittent and without a clear trigger. Denies any fevers, constipation, or  diarrhea. Is not really sure if the Bosutinib is triggering it or another medications. Reports good energy recently. Denies any shortness of breath, feels her breathing has been okay.  She denies any n/v/d. Denies any new cough. Energy is stable. History difficult to obtain as patient presents alone without an aid or caregiver.     Past Medical, Surgical and Family History were reviewed and pertinent updates were made in the Electronic Medical Record    Review of Systems:  All other systems reviewed were negative.     ECOG Performance Status: 2    Past Medical History:  Past Medical History:   Diagnosis Date    GERD (gastroesophageal reflux disease)     Gout     HTN (hypertension)     Morbid obesity (CMS-HCC)     Osteoporosis     Schizoaffective disorder, bipolar type (CMS-HCC)     Type II diabetes mellitus (CMS-HCC)        Medications:  Current Outpatient Medications   Medication Sig Dispense Refill    acetaminophen (TYLENOL) 325 MG tablet Take 1 tablet (325 mg total) by mouth every six (6) hours as needed for fever or pain.      allopurinoL (ZYLOPRIM) 100 MG tablet Take 1 tablet (100 mg total) by mouth daily.      apixaban (ELIQUIS) 5 mg Tab Take 1 tablet (5 mg total) by mouth two (2) times a day.      bisacodyL (DULCOLAX) 5 mg EC tablet Take 2 tablets (10 mg total) by mouth daily as needed for constipation. bosutinib 400 mg Tab Take 1 tablet (400 mg) by mouth daily. Administer with food. Swallow tablet whole; do not cut, crush, break, or chew. 30 tablet 5    clozapine (CLOZARIL) 100 MG tablet Take 1.5 tablets (150 mg total) by mouth daily. 150 mg in the AM and 300 mg at bedtime      diclofenac sodium (VOLTAREN) 1 % gel       famotidine (PEPCID) 20 MG tablet Take 1 tablet (20 mg total) by mouth two (2) times a day.      hydrALAZINE (APRESOLINE) 10 MG tablet Take 1 tablet (10 mg total) by mouth Three (3) times a day.      melatonin 5 mg cap Take 1 capsule by mouth nightly.      metFORMIN (GLUCOPHAGE) 1000 MG tablet Take 0.5 tablets (500 mg total) by mouth daily before breakfast. 1000 mg in the AM and 500 mg at bedtime      metoPROLOL tartrate (LOPRESSOR) 25 MG tablet Take 1 tablet (25 mg total) by mouth two (2) times a day.      multivitamin (TAB-A-VITE/THERAGRAN) per tablet Take 1 capsule by mouth daily.      ondansetron (ZOFRAN) 4 MG tablet Take 1 tablet (4 mg total) by mouth every eight (8) hours as needed for nausea.      pravastatin (PRAVACHOL) 20 MG tablet Take 1 tablet (20 mg total) by mouth at bedtime.      QUEtiapine (SEROQUEL) 50 MG tablet Take 1 tablet (50 mg total) by mouth nightly.      saliva stimulant comb. no.3 (BIOTENE MOISTURIZING MOUTH) Spry 1 spray every two (2) hours as needed.      sertraline (ZOLOFT) 50 MG tablet Take 1 tablet (50 mg total) by mouth daily.      valproic acid (DEPAKENE) 250 mg capsule Take 4 capsules (1,000 mg total) by mouth in the morning and 4 capsules (1,000 mg total)  in the evening.      diphenoxylate-atropine (LOMOTIL) 2.5-0.025 mg per tablet Take 1 tablet by mouth four (4) times a day as needed for diarrhea. (Patient not taking: Reported on 08/14/2022)      lidocaine (LIDODERM) 5 % patch Place 1 patch on the skin daily. Apply to affected area for 12 hours only each day (then remove patch) (Patient not taking: Reported on 08/14/2022)      PARoxetine (PAXIL) 30 MG tablet Take 1 tablet (30 mg total) by mouth every morning. (Patient not taking: Reported on 08/14/2022)       No current facility-administered medications for this visit.     Vital Signs:  BSA: 2.64 meters squared  Vitals:    09/21/22 1329   BP: 114/53   Resp: 16   Temp: 36.8 ??C (98.2 ??F)   SpO2: 95%     Physical Exam:  Constitutional: Obese woman sitting in wheelchair, no acute distress  Eyes: PERRL. No scleral icterus or conjunctival injection.  Cardiovascular:  Regular rate, regular rhythm  S1, S2.  No murmurs, gallops or rubs.   Respiratory:  Breathing is unlabored.  CTAB. No rales, ronchi or crackles.  Breath sounds mildly diminished throughout all lung fields.   GI:  No distention or pain on palpation.  Bowel sounds are present.  .  Musculoskeletal:   strength equal bilaterally  Skin:  No rashes, petechiae or purpura.  grossly intact.   Neurologic:  alert and oriented to person/place, forced speech  Psychiatric: tangential requiring frequent re-direction    Relevant Laboratory, radiology and pathology results:  Lab on 09/21/2022   Component Date Value Ref Range Status    Sodium 09/21/2022 140  135 - 145 mmol/L Final    Potassium 09/21/2022 3.7  3.4 - 4.8 mmol/L Final    Chloride 09/21/2022 103  98 - 107 mmol/L Final    CO2 09/21/2022 32.0 (H)  20.0 - 31.0 mmol/L Final    Anion Gap 09/21/2022 5  5 - 14 mmol/L Final    BUN 09/21/2022 7 (L)  9 - 23 mg/dL Final    Creatinine 57/84/6962 0.51 (L)  0.55 - 1.02 mg/dL Final    BUN/Creatinine Ratio 09/21/2022 14   Final    eGFR CKD-EPI (2021) Female 09/21/2022 >90  >=60 mL/min/1.52m2 Final    eGFR calculated with CKD-EPI 2021 equation in accordance with [employer] and [employer]ph[employer] Task Force recommendations.    Glucose 09/21/2022 108  70 - 179 mg/dL Final    Calcium 95/28/4132 9.5  8.7 - 10.4 mg/dL Final    Albumin 44/01/270 4.1  3.4 - 5.0 g/dL Final    Total Protein 09/21/2022 8.5 (H)  5.7 - 8.2 g/dL Final    Total Bilirubin 09/21/2022 0.2 (L)  0.3 - 1.2 mg/dL Final    AST 53/66/4403 32  <=34 U/L Final    ALT 09/21/2022 30  10 - 49 U/L Final    Alkaline Phosphatase 09/21/2022 78  46 - 116 U/L Final    WBC 09/21/2022 12.6 (H)  3.6 - 11.2 10*9/L Final    RBC 09/21/2022 3.87 (L)  3.95 - 5.13 10*12/L Final    HGB 09/21/2022 11.5  11.3 - 14.9 g/dL Final    HCT 47/42/5956 34.5  34.0 - 44.0 % Final    MCV 09/21/2022 89.0  77.6 - 95.7 fL Final    MCH 09/21/2022 29.6  25.9 - 32.4 pg Final    MCHC 09/21/2022 33.2  32.0 - 36.0  g/dL Final    RDW 16/10/9602 15.6 (H)  12.2 - 15.2 % Final    MPV 09/21/2022 8.4  6.8 - 10.7 fL Final    Platelet 09/21/2022 268  150 - 450 10*9/L Final    Neutrophils % 09/21/2022 53.9  % Final    Lymphocytes % 09/21/2022 34.3  % Final    Monocytes % 09/21/2022 10.1  % Final    Eosinophils % 09/21/2022 0.9  % Final    Basophils % 09/21/2022 0.8  % Final    Absolute Neutrophils 09/21/2022 6.8  1.8 - 7.8 10*9/L Final    Absolute Lymphocytes 09/21/2022 4.3 (H)  1.1 - 3.6 10*9/L Final    Absolute Monocytes 09/21/2022 1.3 (H)  0.3 - 0.8 10*9/L Final    Absolute Eosinophils 09/21/2022 0.1  0.0 - 0.5 10*9/L Final    Absolute Basophils 09/21/2022 0.1  0.0 - 0.1 10*9/L Final

## 2022-09-21 ENCOUNTER — Other Ambulatory Visit: Admit: 2022-09-21 | Discharge: 2022-09-22 | Payer: MEDICARE

## 2022-09-21 ENCOUNTER — Ambulatory Visit
Admit: 2022-09-21 | Discharge: 2022-09-22 | Payer: MEDICARE | Attending: Student in an Organized Health Care Education/Training Program | Primary: Student in an Organized Health Care Education/Training Program

## 2022-09-21 DIAGNOSIS — C9210 Chronic myeloid leukemia, BCR/ABL-positive, not having achieved remission: Principal | ICD-10-CM

## 2022-09-21 LAB — COMPREHENSIVE METABOLIC PANEL
ALBUMIN: 4.1 g/dL (ref 3.4–5.0)
ALKALINE PHOSPHATASE: 78 U/L (ref 46–116)
ALT (SGPT): 30 U/L (ref 10–49)
ANION GAP: 5 mmol/L (ref 5–14)
AST (SGOT): 32 U/L (ref <=34)
BILIRUBIN TOTAL: 0.2 mg/dL — ABNORMAL LOW (ref 0.3–1.2)
BLOOD UREA NITROGEN: 7 mg/dL — ABNORMAL LOW (ref 9–23)
BUN / CREAT RATIO: 14
CALCIUM: 9.5 mg/dL (ref 8.7–10.4)
CHLORIDE: 103 mmol/L (ref 98–107)
CO2: 32 mmol/L — ABNORMAL HIGH (ref 20.0–31.0)
CREATININE: 0.51 mg/dL — ABNORMAL LOW
EGFR CKD-EPI (2021) FEMALE: >90 mL/min/{1.73_m2} (ref >=60)
GLUCOSE RANDOM: 108 mg/dL (ref 70–179)
POTASSIUM: 3.7 mmol/L (ref 3.4–4.8)
PROTEIN TOTAL: 8.5 g/dL — ABNORMAL HIGH (ref 5.7–8.2)
SODIUM: 140 mmol/L (ref 135–145)

## 2022-09-21 LAB — CBC W/ AUTO DIFF
BASOPHILS ABSOLUTE COUNT: 0.1 10*9/L (ref 0.0–0.1)
BASOPHILS RELATIVE PERCENT: 0.8 %
EOSINOPHILS ABSOLUTE COUNT: 0.1 10*9/L (ref 0.0–0.5)
EOSINOPHILS RELATIVE PERCENT: 0.9 %
HEMATOCRIT: 34.5 % (ref 34.0–44.0)
HEMOGLOBIN: 11.5 g/dL (ref 11.3–14.9)
LYMPHOCYTES ABSOLUTE COUNT: 4.3 10*9/L — ABNORMAL HIGH (ref 1.1–3.6)
LYMPHOCYTES RELATIVE PERCENT: 34.3 %
MEAN CORPUSCULAR HEMOGLOBIN CONC: 33.2 g/dL (ref 32.0–36.0)
MEAN CORPUSCULAR HEMOGLOBIN: 29.6 pg (ref 25.9–32.4)
MEAN CORPUSCULAR VOLUME: 89 fL (ref 77.6–95.7)
MEAN PLATELET VOLUME: 8.4 fL (ref 6.8–10.7)
MONOCYTES ABSOLUTE COUNT: 1.3 10*9/L — ABNORMAL HIGH (ref 0.3–0.8)
MONOCYTES RELATIVE PERCENT: 10.1 %
NEUTROPHILS ABSOLUTE COUNT: 6.8 10*9/L (ref 1.8–7.8)
NEUTROPHILS RELATIVE PERCENT: 53.9 %
PLATELET COUNT: 268 10*9/L (ref 150–450)
RED BLOOD CELL COUNT: 3.87 10*12/L — ABNORMAL LOW (ref 3.95–5.13)
RED CELL DISTRIBUTION WIDTH: 15.6 % — ABNORMAL HIGH (ref 12.2–15.2)
WBC ADJUSTED: 12.6 10*9/L — ABNORMAL HIGH (ref 3.6–11.2)

## 2022-09-21 NOTE — Unmapped (Signed)
Knee and Back Pain  - start Voltaren gel 4 times a day for pain    CML  - Continue Bosutinib 400 mg daily   - DO not give Imatinib   - Clinic visit in 3 months to recheck labs and BCR-ABL    If you have questions or concerns, you may call the Nurse call line at [phone].    Lab on 09/21/2022   Component Date Value Ref Range Status    Sodium 09/21/2022 140  135 - 145 mmol/L Final    Potassium 09/21/2022 3.7  3.4 - 4.8 mmol/L Final    Chloride 09/21/2022 103  98 - 107 mmol/L Final    CO2 09/21/2022 32.0 (H)  20.0 - 31.0 mmol/L Final    Anion Gap 09/21/2022 5  5 - 14 mmol/L Final    BUN 09/21/2022 7 (L)  9 - 23 mg/dL Final    Creatinine 93/81/8299 0.51 (L)  0.55 - 1.02 mg/dL Final    BUN/Creatinine Ratio 09/21/2022 14   Final    eGFR CKD-EPI (2021) Female 09/21/2022 >90  >=60 mL/min/1.83m2 Final    eGFR calculated with CKD-EPI 2021 equation in accordance with [employer] and [employer] Task Force recommendations.    Glucose 09/21/2022 108  70 - 179 mg/dL Final    Calcium 37/16/9678 9.5  8.7 - 10.4 mg/dL Final    Albumin 93/81/0175 4.1  3.4 - 5.0 g/dL Final    Total Protein 09/21/2022 8.5 (H)  5.7 - 8.2 g/dL Final    Total Bilirubin 09/21/2022 0.2 (L)  0.3 - 1.2 mg/dL Final    AST 10/26/8525 32  <=34 U/L Final    ALT 09/21/2022 30  10 - 49 U/L Final    Alkaline Phosphatase 09/21/2022 78  46 - 116 U/L Final    WBC 09/21/2022 12.6 (H)  3.6 - 11.2 10*9/L Final    RBC 09/21/2022 3.87 (L)  3.95 - 5.13 10*12/L Final    HGB 09/21/2022 11.5  11.3 - 14.9 g/dL Final    HCT 78/24/2353 34.5  34.0 - 44.0 % Final    MCV 09/21/2022 89.0  77.6 - 95.7 fL Final    MCH 09/21/2022 29.6  25.9 - 32.4 pg Final    MCHC 09/21/2022 33.2  32.0 - 36.0 g/dL Final    RDW 61/44/3154 15.6 (H)  12.2 - 15.2 % Final    MPV 09/21/2022 8.4  6.8 - 10.7 fL Final    Platelet 09/21/2022 268  150 - 450 10*9/L Final    Neutrophils % 09/21/2022 53.9  % Final    Lymphocytes % 09/21/2022 34.3  % Final    Monocytes % 09/21/2022 10.1  % Final    Eosinophils % 09/21/2022 0.9  % Final    Basophils % 09/21/2022 0.8  % Final    Absolute Neutrophils 09/21/2022 6.8  1.8 - 7.8 10*9/L Final    Absolute Lymphocytes 09/21/2022 4.3 (H)  1.1 - 3.6 10*9/L Final    Absolute Monocytes 09/21/2022 1.3 (H)  0.3 - 0.8 10*9/L Final    Absolute Eosinophils 09/21/2022 0.1  0.0 - 0.5 10*9/L Final    Absolute Basophils 09/21/2022 0.1  0.0 - 0.1 10*9/L Final

## 2022-09-27 NOTE — Unmapped (Signed)
Hi,    Sandrea Hughs w/ Unit Manager w/ [facility] is contacting us in reference to patient the patient  [name]  and she called requesting a medication refill for the following:    Medication: Bosutinib   Dosage: 400mg    Days left of medication: 0  Pharmacy: [facility]                               [phone]    Wendy's call back is [phone] ext [phone].    The expected turnaround time is 3-4 business days     Thank you,  Noland Fordyce  [facility]  [phone]

## 2022-09-27 NOTE — Unmapped (Signed)
Called and spoke to [place] at facility. Scheduled upcoming appointment with date and time verbally agreed upon. Request sent to scheduler for 10/29 labs 1PM and [facility] visit 2PM.    Also spoke to pharmacy team about chemotherapy pill. Explained we will continue this pill month to month and she needs to be take 400mg  daily. Confirmed understanding. Got number for refill from pharmacy team and shared with Toniann Fail.     No further questions.

## 2022-09-27 NOTE — Unmapped (Signed)
Corky Sox, contacted the Communication Center requesting to speak with the care team of Nashley Maunu to discuss:    -the pharmacy is relocating and can not fill the patient's bosutinib 400mg  until October. Toniann Fail asked if the provider wants them to hold the medication or will he send something else.     Please contact Toniann Fail  at [phone] ext [phone].    Thank you,   Durward Fortes  [facility]   [phone]

## 2022-10-02 MED FILL — BOSULIF 400 MG TABLET: ORAL | 30 days supply | Qty: 30 | Fill #1

## 2022-10-02 NOTE — Unmapped (Signed)
[facility] Clinical Assessment & Refill Coordination Note    Kimberly Mathis, DOB: 1965/10/08  Phone: [phone] (home)     All above HIPAA information was verified with patient's caregiver, Kimberly Mathis.     Was a Nurse, learning disability used for this call? No    Specialty Medication(s):   Hematology/Oncology: Bosulif     Current Outpatient Medications   Medication Sig Dispense Refill    acetaminophen (TYLENOL) 325 MG tablet Take 1 tablet (325 mg total) by mouth every six (6) hours as needed for fever or pain.      allopurinoL (ZYLOPRIM) 100 MG tablet Take 1 tablet (100 mg total) by mouth daily.      apixaban (ELIQUIS) 5 mg Tab Take 1 tablet (5 mg total) by mouth two (2) times a day.      bisacodyL (DULCOLAX) 5 mg EC tablet Take 2 tablets (10 mg total) by mouth daily as needed for constipation.      bosutinib 400 mg Tab Take 1 tablet (400 mg) by mouth daily. Administer with food. Swallow tablet whole; do not cut, crush, break, or chew. 30 tablet 5    clozapine (CLOZARIL) 100 MG tablet Take 1.5 tablets (150 mg total) by mouth daily. 150 mg in the AM and 300 mg at bedtime      diclofenac sodium (VOLTAREN) 1 % gel       famotidine (PEPCID) 20 MG tablet Take 1 tablet (20 mg total) by mouth two (2) times a day.      hydrALAZINE (APRESOLINE) 10 MG tablet Take 1 tablet (10 mg total) by mouth Three (3) times a day.      melatonin 5 mg cap Take 1 capsule by mouth nightly.      metFORMIN (GLUCOPHAGE) 1000 MG tablet Take 0.5 tablets (500 mg total) by mouth daily before breakfast. 1000 mg in the AM and 500 mg at bedtime      metoPROLOL tartrate (LOPRESSOR) 25 MG tablet Take 1 tablet (25 mg total) by mouth two (2) times a day.      multivitamin (TAB-A-VITE/THERAGRAN) per tablet Take 1 capsule by mouth daily.      ondansetron (ZOFRAN) 4 MG tablet Take 1 tablet (4 mg total) by mouth every eight (8) hours as needed for nausea.      pravastatin (PRAVACHOL) 20 MG tablet Take 1 tablet (20 mg total) by mouth at bedtime. QUEtiapine (SEROQUEL) 50 MG tablet Take 1 tablet (50 mg total) by mouth nightly.      saliva stimulant comb. no.3 (BIOTENE MOISTURIZING MOUTH) Spry 1 spray every two (2) hours as needed.      sertraline (ZOLOFT) 50 MG tablet Take 1 tablet (50 mg total) by mouth daily.      valproic acid (DEPAKENE) 250 mg capsule Take 4 capsules (1,000 mg total) by mouth in the morning and 4 capsules (1,000 mg total) in the evening.       No current facility-administered medications for this visit.        Changes to medications: Kimberly Mathis reports no changes at this time.    No Known Allergies    Changes to allergies: No    SPECIALTY MEDICATION ADHERENCE     Bosulif 400 mg: 0 doses of medicine on hand     Medication Adherence    Patient reported X missed doses in the last month: >5  Specialty Medication: Bosulif 400 mg once daily  Patient is on additional specialty medications: No  Informant: caregiver   Other  non-adherence reason: unable to contact patient/facility to schedule delivery   Support network for adherence: home health agency  Confirmed plan for next specialty medication refill: delivery by pharmacy  Refills needed for supportive medications: not needed          Specialty medication(s) dose(s) confirmed: Regimen is correct and unchanged.     Are there any concerns with adherence? Yes: pt has been out of medication for 2 weeks.  [facility] pharmacy was unable to contact caregiver for several weeks to schedule delivery.      Adherence counseling provided? Yes: Provided Kimberly Mathis, caregiver, with correct phone number and hours of operation for [facility].  She was advised to call us if the patient was down to week or less of medication and schedule a delivery if she had not been already contacted.    CLINICAL MANAGEMENT AND INTERVENTION      Clinical Benefit Assessment:    Do you feel the medicine is effective or helping your condition?  Yes, per recent provider visit    Clinical Benefit counseling provided? Not needed    Adverse Effects Assessment:    Are you experiencing any side effects?  Kimberly Mathis did not report any new or worsening side effects    Are you experiencing difficulty administering your medicine? No    Quality of Life Assessment:    Quality of Life    Rheumatology  Oncology  1. What impact has your specialty medication had on the reduction of your daily pain or discomfort level?: None  2. On a scale of 1-10, how would you rate your ability to manage side effects associated with your specialty medication? (1=no issues, 10 = unable to take medication due to side effects): 1  Dermatology  Cystic Fibrosis          How many days over the past month did your CML  keep you from your normal activities? For example, brushing your teeth or getting up in the morning. 0    Have you discussed this with your provider? Not needed    Acute Infection Status:    Acute infections noted within Epic:  No active infections  Patient reported infection: None    Therapy Appropriateness:    Is therapy appropriate based on current medication list, adverse reactions, adherence, clinical benefit and progress toward achieving therapeutic goals? Yes, therapy is appropriate and should be continued     DISEASE/MEDICATION-SPECIFIC INFORMATION      N/A    Oncology: Is the patient receiving adequate infection prevention treatment? Not applicable  Does the patient have adequate nutritional support? Not applicable    PATIENT SPECIFIC NEEDS     Does the patient have any physical, cognitive, or cultural barriers?  She resides in a nursing facility and needs assistance with activities    Is the patient high risk? No    Did the patient require a clinical intervention? No    Does the patient require physician intervention or other additional services (i.e., nutrition, smoking cessation, social work)? No    SOCIAL DETERMINANTS OF HEALTH     At the [facility], we have learned that life circumstances - like trouble affording food, housing, utilities, or transportation can affect the health of many of our patients.   That is why we wanted to ask: are you currently experiencing any life circumstances that are negatively impacting your health and/or quality of life? Patient declined to answer    Social Determinants of Health     Food Insecurity: No  Food Insecurity (04/01/2018)    Hunger Vital Sign     Worried About Running Out of Food in the Last Year: Never true     Ran Out of Food in the Last Year: Never true   Internet Connectivity: Not on file   Housing/Utilities: Unknown (04/10/2020)    Housing/Utilities     Within the past 12 months, have you ever stayed: outside, in a car, in a tent, in an overnight shelter, or temporarily in someone else's home (i.e. couch-surfing)?: Patient refused     Are you worried about losing your housing?: Not on file     Within the past 12 months, have you been unable to get utilities (heat, electricity) when it was really needed?: Not on file   Tobacco Use: Medium Risk (09/22/2022)    Patient History     Smoking Tobacco Use: Former     Smokeless Tobacco Use: Never     Passive Exposure: Not on file   Transportation Needs: No Transportation Needs (04/01/2018)    PRAPARE - Therapist, art (Medical): No     Lack of Transportation (Non-Medical): No   Alcohol Use: Not on file   Interpersonal Safety: Unknown (10/02/2022)    Interpersonal Safety     Unsafe Where You Currently Live: Not on file     Physically Hurt by Anyone: Not on file     Abused by Anyone: Not on file   Physical Activity: Unknown (04/01/2018)    Exercise Vital Sign     Days of Exercise per Week: Patient declined     Minutes of Exercise per Session: Patient declined   Intimate Partner Violence: Unknown (04/01/2018)    Humiliation, Afraid, Rape, and Kick questionnaire     Fear of Current or Ex-Partner: Patient declined     Emotionally Abused: Patient declined     Physically Abused: Patient declined     Sexually Abused: Patient declined   Stress: Not on file   Substance Use: Not on file   Social Connections: Unknown (04/01/2018)    Social Connection and Isolation Panel [NHANES]     Frequency of Communication with Friends and Family: Patient declined     Frequency of Social Gatherings with Friends and Family: Patient declined     Attends Religious Services: Patient declined     Database administrator or Organizations: Patient declined     Attends Banker Meetings: Patient declined     Marital Status: Patient declined   Physicist, medical Strain: Low Risk  (04/01/2018)    Overall Financial Resource Strain (CARDIA)     Difficulty of Paying Living Expenses: Not very hard   Depression: Not at risk (09/21/2022)    PHQ-2     PHQ-2 Score: 0   Health Literacy: Not on file (04/10/2020)       Would you be willing to receive help with any of the needs that you have identified today? Not applicable       SHIPPING     Specialty Medication(s) to be Shipped:   Hematology/Oncology: Bosulif    Other medication(s) to be shipped: No additional medications requested for fill at this time     Changes to insurance: No    Delivery Scheduled: Yes, Expected medication delivery date: 10/02/22.     Medication will be delivered via Same Day Courier to the confirmed prescription address in [facility].    The patient will receive a drug information handout for each medication shipped  and additional FDA Medication Guides as required.  Verified that patient has previously received a Conservation officer, historic buildings and a Surveyor, mining.    The patient or caregiver noted above participated in the development of this care plan and knows that they can request review of or adjustments to the care plan at any time.      All of the patient's questions and concerns have been addressed.    Kermit Balo, [facility]   [facility] Specialty Pharmacist

## 2022-10-25 NOTE — Unmapped (Signed)
[facility] Refill Coordination Note    Specialty Medication(s) to be Shipped:   Hematology/Oncology: Bosulif    Other medication(s) to be shipped: No additional medications requested for fill at this time     Kimberly Mathis, DOB: 1965/11/10  Phone: [phone] (home)       All above HIPAA information was verified with patient's caregiver, Kimberly Mathis      Was a Nurse, learning disability used for this call? No    Completed refill call assessment today to schedule patient's medication shipment from the [facility]  [phone]).  All relevant notes have been reviewed.     Specialty medication(s) and dose(s) confirmed: Regimen is correct and unchanged.   Changes to medications: [name] reports no changes at this time.  Changes to insurance: No  New side effects reported not previously addressed with a pharmacist or physician: None reported  Questions for the pharmacist: No    Confirmed patient received a Conservation officer, historic buildings and a Surveyor, mining with first shipment. The patient will receive a drug information handout for each medication shipped and additional FDA Medication Guides as required.       DISEASE/MEDICATION-SPECIFIC INFORMATION        N/A    SPECIALTY MEDICATION ADHERENCE     Medication Adherence    Patient reported X missed doses in the last month: 0  Specialty Medication: Bosulif 400 mg  Patient is on additional specialty medications: No  Informant: caregiver  Support network for adherence: home health agency     Were doses missed due to medication being on hold? No    Bosulif 400 mg: 30 days of medicine on hand       REFERRAL TO PHARMACIST     Referral to the pharmacist: Not needed      [facility]     Shipping address confirmed in Epic.       Delivery Scheduled: Patient declined refill at this time due to has 30 days left of medication.     Medication will be delivered via  n/a  to the  n/a  address in Epic [place].    Kimberly Mathis M Kimberly Mathis   [facility]  Specialty Technician

## 2022-10-28 DIAGNOSIS — C9210 Chronic myeloid leukemia, BCR/ABL-positive, not having achieved remission: Principal | ICD-10-CM

## 2022-10-29 NOTE — Unmapped (Unsigned)
[facility] Follow-up    Patient Name: Kimberly Mathis  Patient Age: 57 y.o.  Encounter Date: 10/31/2022    Primary Care Provider:  Desiree Lucy, PA    Referring Physician:  Haig Prophet, PA  [address]  [place],  [place] [address]    Reason for visit:   F/u visit for [facility]    Assessment:  Kimberly Mathis is a 57 y.o. 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 She began treatment on imatinib in 12/2015 with optimal response and meeting appropriate treatment milestones, except her one year PCR, which was slightly above goal.  She has been in a MMR and MR4.0 since 05/2018 until Jan 2021 when she lost MMR d/t caregiver change and missing medication. She was able to achieve MMR again until Oct 2022. She has had stable transcripts since Dec 2022, until her most recent level in June 2023 when it was discovered that she was not receiving her medication daily at her home. Her level did improve to a more normal state, however despite her facility giving her the medication daily, per the [facility], her BCR-ABL transcripts stayed elevated at  0.551 %. A BCR-ABL mutational panel was drawn which did not reveal the presence of any new mutations. Therefore the decision was made to switch her to Bosutinib 400 mg. She presents today approximately 6 weeks after starting the medication for repeat labs and a CPP/APP visit.     Kimberly Mathis presents to clinic today alone, without a caregiver or aid.  She continues to complain of knee pain that has been longstanding. She reports some mild intermittent stomach discomfort, but denies any nausea/vomiting, or diarrhea/constipation. Otherwise denies any new shortness of breath or increased side effects from her new medication. Kimberly Mathis brought with her just a medication list without a MAR. I will call her facility to review her medications and have a recent [facility] sent to [place] to ensure that she is receiving her medication everyday.I did confirm that they are only giving her Bosutinib and not Imatinib, which they were doing for a couple of weeks.  On lab review today. CBC is fairly stable with WBC 12.6, Hgb 11.5, Plt 268, ANC 6.8. CMP is WNL. BCR-ABL is drawn today and is pending as it has been 3 months since her last value.     I again re-iterated to the April the Director of Nursing, and the facility that she needs a CNA or Aid to present with her at all times for safety reasons. Although I continue to request this, she has still presented to every appointment alone over the past few months. Will make the patients guardian,  [name] aware of this, as it continues to be a safety concern.  I strongly advised and insisted that patient come to all of her appointments in the future with a caregiver or aid from her home as well as a full MAR from the past 3 months. RTC in 6 weeks to check in after 3 months on Bosutinib.     History of PE:  She had an admission back in April 2024 for a lethargy and shortness of breath, and was diagnosed with  pulmonary emboli within subsegmental and segmental branches to the bilateral upper and lower lobes. She was discharged on 4/1 and has been stable on Eliquis 5 mg twice a day. She has a new patient appointment with Benign Hematology in  October to determine duration.   - Continue Eliquis 5 mg twice a day    Plan and Recommendations:  - Continue Bosutinib 400 mg daily  - Discussed with facility to ensure that patient has an aid sent with her when coming to these appointments  - Will reach out to patient's guardian with updates and to inform her that the patient continues to come without an aid  - Recommended full and updated MAR from the past 3 months when patient comes to appointments    - Patients facility will fax over  - Continue Eliquis 5 mg twice daily for hx of PE, patient will follow up with benign hematology in October   - RTC in 6 weeks after 3 months on Bosutinib    Dr. Vertell Limber was available    [name] PA-C  Physician Assistant   Hematology/Oncology Division  [facility]  10/31/2022    I personally spent 40 minutes face-to-face and non-face-to-face in the care of this patient, which includes all pre, intra, and post visit time on the date of service.    History of Present Illness:  Hematology/Oncology History Overview Note   Diagnosis: CML    SOKAL Score:    Presentation: asymptomatic; leukocytosis found on routine CBC    Presenting WBC Count: 17,400    Bone Marrow Biopsy: not performed as patient unable to tolerate procedure.    Cytogenetics:  Abnormal FISH: An interphase FISH assay shows an abnormal signal pattern consistent with BCR/ABL1 fusion in 44% of the 100 cells scored. Of note, these cells contain an atypical abnormal signal pattern consistent with BCR/ABL1 rearrangement and loss of the ABL1/ASS1 region.    Treatment:  Imatinib 400 mg daily- 12/31/2015    12/31/15:  BCR-ABL1 p210 transcripts were detected at a level of 9.511 IS% ratio in blood.  BCR-ABL1 p190 transcripts were detected at a level of 3 in 100,000 cells in blood.    03/23/16:  BCR-ABL1 p210 transcripts were detected at a level of 0.702 IS % ratio in blood.    Normal FISH:  A BCR/ABL1 interphase FISH assay for the previously documented 9;22 translocation shows a normal signal pattern in 100% of the 200 nuclei scored     06/15/16:  BCR-ABL1 p210 transcripts were detected at a level of 0.272 IS % ratio in blood.    09/2016:  0.298 IS %    12/18: 0.241 IS%    03/22/17: 0.072%  06/21/17: 0.052%  09/27/17: 0.125%  01/03/18: 0.040%  05/28/18: 0.008%  09/13/18: 0.003%  01/14/19: 0.181%  02/05/19: 0.149%  04/10/19: 0.037%  09/03/19: 0.040%  12/04/19: 0.004%  03/04/20: 0.032%  06/30/20: 0.018%  10/07/20: 0.307%  12/23/20: 0.351%  06/02/21: 3.311%  09/07/2021:  0.887%  12/07/2021: 2.530%  03/14/2022: 0. 496%  06/13/2022: 0.551%    07/28/2022: Started Bosutinib 400 mg (was being given Imatinib 400 mg as well per error from her facility)    08/14/2022: STOPPED Imatinib 400 mg (given instructions to continue Bosutinib 400 mg )     CML (chronic myelocytic leukemia) (CMS-HCC)   12/28/2015 Initial Diagnosis    CML (chronic myelocytic leukemia) (RAF-HCC)         Interval History:  Kimberly Mathis presents to clinic today alone without an aid. She reports that she is doing okay besides her continued knee and back pain.She has been having some increased stomach pains in her left lower side. States they are only intermittent and without a clear trigger. Denies any fevers, constipation, or diarrhea. Is  not really sure if the Bosutinib is triggering it or another medications. Reports good energy recently. Denies any shortness of breath, feels her breathing has been okay.  She denies any n/v/d. Denies any new cough. Energy is stable. History difficult to obtain as patient presents alone without an aid or caregiver.     Past Medical, Surgical and Family History were reviewed and pertinent updates were made in the Electronic Medical Record    Review of Systems:  All other systems reviewed were negative.     ECOG Performance Status: 2    Past Medical History:  Past Medical History:   Diagnosis Date    GERD (gastroesophageal reflux disease)     Gout     HTN (hypertension)     Morbid obesity (CMS-HCC)     Osteoporosis     Schizoaffective disorder, bipolar type (CMS-HCC)     Type II diabetes mellitus (CMS-HCC)        Medications:  Current Outpatient Medications   Medication Sig Dispense Refill    acetaminophen (TYLENOL) 325 MG tablet Take 1 tablet (325 mg total) by mouth every six (6) hours as needed for fever or pain.      allopurinoL (ZYLOPRIM) 100 MG tablet Take 1 tablet (100 mg total) by mouth daily.      apixaban (ELIQUIS) 5 mg Tab Take 1 tablet (5 mg total) by mouth two (2) times a day.      bisacodyL (DULCOLAX) 5 mg EC tablet Take 2 tablets (10 mg total) by mouth daily as needed for constipation.      bosutinib 400 mg Tab Take 1 tablet (400 mg) by mouth daily. Administer with food. Swallow tablet whole; do not cut, crush, break, or chew. 30 tablet 5    clozapine (CLOZARIL) 100 MG tablet Take 1.5 tablets (150 mg total) by mouth daily. 150 mg in the AM and 300 mg at bedtime      diclofenac sodium (VOLTAREN) 1 % gel       famotidine (PEPCID) 20 MG tablet Take 1 tablet (20 mg total) by mouth two (2) times a day.      hydrALAZINE (APRESOLINE) 10 MG tablet Take 1 tablet (10 mg total) by mouth Three (3) times a day.      melatonin 5 mg cap Take 1 capsule by mouth nightly.      metFORMIN (GLUCOPHAGE) 1000 MG tablet Take 0.5 tablets (500 mg total) by mouth daily before breakfast. 1000 mg in the AM and 500 mg at bedtime      metoPROLOL tartrate (LOPRESSOR) 25 MG tablet Take 1 tablet (25 mg total) by mouth two (2) times a day.      multivitamin (TAB-A-VITE/THERAGRAN) per tablet Take 1 capsule by mouth daily.      ondansetron (ZOFRAN) 4 MG tablet Take 1 tablet (4 mg total) by mouth every eight (8) hours as needed for nausea.      pravastatin (PRAVACHOL) 20 MG tablet Take 1 tablet (20 mg total) by mouth at bedtime.      QUEtiapine (SEROQUEL) 50 MG tablet Take 1 tablet (50 mg total) by mouth nightly.      saliva stimulant comb. no.3 (BIOTENE MOISTURIZING MOUTH) Spry 1 spray every two (2) hours as needed.      sertraline (ZOLOFT) 50 MG tablet Take 1 tablet (50 mg total) by mouth daily.      valproic acid (DEPAKENE) 250 mg capsule Take 4 capsules (1,000 mg total) by mouth in the morning and 4 capsules (  1,000 mg total) in the evening.       No current facility-administered medications for this visit.     Vital Signs:  BSA: There is no height or weight on file to calculate BSA.  There were no vitals filed for this visit.    Physical Exam:  Constitutional: Obese woman sitting in wheelchair, no acute distress  Eyes: PERRL. No scleral icterus or conjunctival injection.  Cardiovascular:  Regular rate, regular rhythm S1, S2.  No murmurs, gallops or rubs.   Respiratory:  Breathing is unlabored.  CTAB. No rales, ronchi or crackles.  Breath sounds mildly diminished throughout all lung fields.   GI:  No distention or pain on palpation.  Bowel sounds are present.  .  Musculoskeletal:   strength equal bilaterally  Skin:  No rashes, petechiae or purpura.  grossly intact.   Neurologic:  alert and oriented to person/place, forced speech  Psychiatric: tangential requiring frequent re-direction    Relevant Laboratory, radiology and pathology results:  No visits with results within 1 Day(s) from this visit.   Latest known visit with results is:   Lab on 09/21/2022   Component Date Value Ref Range Status    Sodium 09/21/2022 140  135 - 145 mmol/L Final    Potassium 09/21/2022 3.7  3.4 - 4.8 mmol/L Final    Chloride 09/21/2022 103  98 - 107 mmol/L Final    CO2 09/21/2022 32.0 (H)  20.0 - 31.0 mmol/L Final    Anion Gap 09/21/2022 5  5 - 14 mmol/L Final    BUN 09/21/2022 7 (L)  9 - 23 mg/dL Final    Creatinine 40/10/2723 0.51 (L)  0.55 - 1.02 mg/dL Final    BUN/Creatinine Ratio 09/21/2022 14   Final    eGFR CKD-EPI (2021) Female 09/21/2022 >90  >=60 mL/min/1.49m2 Final    eGFR calculated with CKD-EPI 2021 equation in accordance with [employer] and [employer] of Nephrology Task Force recommendations.    Glucose 09/21/2022 108  70 - 179 mg/dL Final    Calcium 36/64/4034 9.5  8.7 - 10.4 mg/dL Final    Albumin 74/25/9563 4.1  3.4 - 5.0 g/dL Final    Total Protein 09/21/2022 8.5 (H)  5.7 - 8.2 g/dL Final    Total Bilirubin 09/21/2022 0.2 (L)  0.3 - 1.2 mg/dL Final    AST 87/56/4332 32  <=34 U/L Final    ALT 09/21/2022 30  10 - 49 U/L Final    Alkaline Phosphatase 09/21/2022 78  46 - 116 U/L Final    Specimen Type 09/21/2022    Final                    Value:Blood    BCR::ABL1 p210 Assay 09/21/2022 Positive   Final    BCR::ABL1 p210 IS% Ratio 09/21/2022 0.023  <0.001 IS% Ratio Final    BCR/ABL1 p210 Assay Results 09/21/2022 Final                    Value:RESULTS:  BCR::ABL1 p210 transcripts were detected at a level of 0.023 IS % ratio in blood.    INTERPRETATION:  Detection of BCR::ABL1 p210 RNA suggests the presence of cells containing t(9;22). This result is not significantly different from the previous level of 0.551 IS % ratio in blood on 06/13/2022.     COMMENT:  In serial measurements, a ten-fold change is considered to be clinically significant.      REFERENCES:  Melo JV. Baillieres Clin  Haematol. 1997 Jun;10(2):203-22. PMID: 4034742  Ronny Flurry, et al. Clin Cancer Res. 2005 May 1;11(9):3425-32. PMID: [id number]  NCCN Clinical Practice Guidelines [email or web address]   Dominga Ferry, et al. Mod Pathol. 2004 Jan;17(1):96-103. PMID: [id number]  Melanie Crazier, et al. Leukemia. 2003 Dec;17(12):2318-57. PMID: [id number]    METHOD:  Reverse transcription polymerase chain reaction was performed on QuantStudio Dx instrument using primers and TaqMan probe targeting BCR::ABL1 p210 and ABL1 control gene cDNA. Assay sensitivity is estimated at one cell per 100,000                           cells which is equivalent to 0.001 IS % ratio on the international scale. The reference range for this test is: No BCR::ABL1 p210 RNA detected.    This test was developed and its performance characteristics determined by the [facility]. It has not been cleared by the [place] Food and Drug Administration. However, such approval is not required for clinical implementation, and test results have been shown to be clinically useful. This laboratory is CAP accredited and CLIA certified to perform high complexity testing.                  Electronically Signed by: Hardie Shackleton, MD on 09/27/22 at 12:24 PM      WBC 09/21/2022 12.6 (H)  3.6 - 11.2 10*9/L Final    RBC 09/21/2022 3.87 (L)  3.95 - 5.13 10*12/L Final    HGB 09/21/2022 11.5  11.3 - 14.9 g/dL Final    HCT 30/16/0109 34.5  34.0 - 44.0 % Final    MCV 09/21/2022 89.0  77.6 - 95.7 fL Final    MCH 09/21/2022 29.6  25.9 - 32.4 pg Final    MCHC 09/21/2022 33.2  32.0 - 36.0 g/dL Final    RDW 32/35/5732 15.6 (H)  12.2 - 15.2 % Final    MPV 09/21/2022 8.4  6.8 - 10.7 fL Final    Platelet 09/21/2022 268  150 - 450 10*9/L Final    Neutrophils % 09/21/2022 53.9  % Final    Lymphocytes % 09/21/2022 34.3  % Final    Monocytes % 09/21/2022 10.1  % Final    Eosinophils % 09/21/2022 0.9  % Final    Basophils % 09/21/2022 0.8  % Final    Absolute Neutrophils 09/21/2022 6.8  1.8 - 7.8 10*9/L Final    Absolute Lymphocytes 09/21/2022 4.3 (H)  1.1 - 3.6 10*9/L Final    Absolute Monocytes 09/21/2022 1.3 (H)  0.3 - 0.8 10*9/L Final    Absolute Eosinophils 09/21/2022 0.1  0.0 - 0.5 10*9/L Final    Absolute Basophils 09/21/2022 0.1  0.0 - 0.1 10*9/L Final

## 2022-10-31 ENCOUNTER — Ambulatory Visit: Admit: 2022-10-31 | Discharge: 2022-10-31 | Payer: MEDICARE

## 2022-10-31 ENCOUNTER — Ambulatory Visit
Admit: 2022-10-31 | Discharge: 2022-10-31 | Payer: MEDICARE | Attending: Hematology & Oncology | Primary: Hematology & Oncology

## 2022-10-31 ENCOUNTER — Ambulatory Visit
Admit: 2022-10-31 | Discharge: 2022-10-31 | Payer: MEDICARE | Attending: Student in an Organized Health Care Education/Training Program | Primary: Student in an Organized Health Care Education/Training Program

## 2022-10-31 DIAGNOSIS — I2699 Other pulmonary embolism without acute cor pulmonale: Principal | ICD-10-CM

## 2022-10-31 NOTE — Unmapped (Signed)
ASSESSMENT:      Venous thromboembolism: Bilateral low risk PE diagnosed 03/29/2022-report available to me (PE within subsegmental and segmental branches  bilateral upper and lower lobes, some with chronic appearance and wall adherent.  No evidence of right heart strain).  VTE risk factors: A.  Partial immobility, B. CML.  CML increases the risk of VTE: According to a meta-analysis and systematic review, the incidence rate of VTE in patients with CML is approximately 13% (95% CI: 1-36%). This elevated risk is consistent with findings in other hematologic malignancies. Also, the ASH guidelines highlight that patients with hematologic malignancies, including CML, are at increased risk for VTE compared to the general population. I would not consider imatinib as the VTE risk factor  - while there are isolated reports of thrombotic events in patients on imatinib, these are rare and not sufficient to establish a direct causal relationship.     I am not able to get any meaningful history from the patient today, but based on her VTE history and her underlying CML I think continued anticoagulation is appropriate as long as she tolerates it well.  Given her body weight of 147 kg and the body mass index of 50.9, I would recommend to continue Eliquis 5 mg twice daily long-term, as long as she tolerates it well.  No follow-up with me is needed.    CML: Dr. 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 She began treatment on imatinib in 12/2015 with optimal response and meeting appropriate treatment milestones, except her one year PCR, which was slightly above goal.  She has been in a MMR and MR4.0 since 05/2018 until Jan 2021 when she lost MMR d/t caregiver change and missing medication. She was able to achieve MMR again until Oct 2022. She has had stable transcripts since Dec 2022, until her most recent level in June 2023 when it was discovered that she was not receiving her medication daily at her home. Her level did improve to a more normal state, however despite her facility giving her the medication daily, per the [facility], her BCR-ABL transcripts stayed elevated at  0.551 %. A BCR-ABL mutational panel was drawn which did not reveal the presence of any new mutations. Therefore the decision was made to switch her to Bosutinib 400 mg. She presents today approximately 2 weeks after starting the medication for repeat labs and a CPP/APP visit.     PLAN:      Eliquis 5 mg twice daily long-term  No follow-up with me was arranged  Follow-up with malignant hematology for [facility]  Cherie Dark, MD  Professor of Medicine  Division of Hematology  [facility]  [address]  [place], [place] [address]    Clinic  Main number: [phone]  Fax: [phone]  Certified Medical Assistant: Anders Grant  Schedulers: Cordella Register, Deanne Coffer, Babs Bertin  Nurse: Macky Lower    Physician Assistant: Wynona Canes    [email or web address]

## 2022-10-31 NOTE — Unmapped (Signed)
[facility]ematology Clinic New Patient Evaluation          57 y.o. old patient who presents as a consultation referral from [facility] [place], [place] for evaluation of bilateral PE.    HISTORY OF PRESENT ILLNESS:      I am not able to get any meaningful history from the patient today.  Our nurse Ada tried to reach the patient's long-term care facility to talk to nursing staff but was not able to get through.  I have reviewed this patient's medical records.    March 2024  The patient was admitted to [facility] from 3/27 until 04/03/2022 with bilateral pulmonary embolism. CT chest, PE protocol, on 03/29/2022 showed Pulmonary emboli within subsegmental and segmental branches to the bilateral upper and lower lobes, some with chronic appearance and wall adherent.  No evidence of right heart strain.  Cardiac echo on 03/30/2022 was a technically difficult study; right ventricle was not well-visualized but probably normal in size with probably normal systolic function.  The hospital discharge summary states:  Patient presented to [facility] from her long term care facility for marked fatigue, weakness, somnolence. She has not been as talkative. EMS was called, they noted the patient was hypoxic in the high 80s to low 90s. Pt was placed on 2L [place]. She has orders for 2L nocturnal O2, but apparently uses PRN. Patient is a poor historian and was not able to provide much history but denied any specific symptoms including fevers, chills, abd pain, nausea, vomiting, diarrhea, dysuria, shortness of breath or cough. Reported chronic leg and back pain.  She was started on Eliquis.  Discharge summary states: She will need follow up with Hematology Oncology whom she is already established with to determine length of treatment of anticoagulation, however, her risk will remain high with hx of CML and sedentary lifestyle.:    Oct 2024  PE protocol CT chest on 10/20/2022 showed  There is appropriate enhancement of the central pulmonary arteries. There is no evidence of acute pulmonary embolism. Chronic pulmonary emboli, including in subsegmental right lower lobe pulmonary artery (5/73). Marland Kitchen     PAST MEDICAL HISTORY:   Hepatic steatosis  Pulm embolism  Cholelithiasis  Diabetes mellitus, hypertension, gout, GERD, hyperlipidemia  BMI 50.9, weight 147.4 kg  Severe schizoaffective disorder  Chronic myelogenous leukemia -on imatinib  Cognitive impairment    MEDICATIONS:        Outpatient Encounter Medications as of 10/31/2022   Medication Sig Dispense Refill    acetaminophen (TYLENOL) 325 MG tablet Take 1 tablet (325 mg total) by mouth every six (6) hours as needed for fever or pain.      allopurinoL (ZYLOPRIM) 100 MG tablet Take 1 tablet (100 mg total) by mouth daily.      apixaban (ELIQUIS) 5 mg Tab Take 1 tablet (5 mg total) by mouth two (2) times a day.      bisacodyL (DULCOLAX) 5 mg EC tablet Take 2 tablets (10 mg total) by mouth daily as needed for constipation.      bosutinib 400 mg Tab Take 1 tablet (400 mg) by mouth daily. Administer with food. Swallow tablet whole; do not cut, crush, break, or chew. 30 tablet 5    clozapine (CLOZARIL) 100 MG tablet Take 1.5 tablets (150 mg total) by mouth daily. 150 mg in the AM and 300 mg at bedtime      diclofenac sodium (VOLTAREN) 1 % gel       famotidine (PEPCID)  20 MG tablet Take 1 tablet (20 mg total) by mouth two (2) times a day.      hydrALAZINE (APRESOLINE) 10 MG tablet Take 1 tablet (10 mg total) by mouth Three (3) times a day.      melatonin 5 mg cap Take 1 capsule by mouth nightly.      metFORMIN (GLUCOPHAGE) 1000 MG tablet Take 0.5 tablets (500 mg total) by mouth daily before breakfast. 1000 mg in the AM and 500 mg at bedtime      metoPROLOL tartrate (LOPRESSOR) 25 MG tablet Take 1 tablet (25 mg total) by mouth two (2) times a day.      multivitamin (TAB-A-VITE/THERAGRAN) per tablet Take 1 capsule by mouth daily.      ondansetron (ZOFRAN) 4 MG tablet Take 1 tablet (4 mg total) by mouth every eight (8) hours as needed for nausea.      pravastatin (PRAVACHOL) 20 MG tablet Take 1 tablet (20 mg total) by mouth at bedtime.      QUEtiapine (SEROQUEL) 50 MG tablet Take 1 tablet (50 mg total) by mouth nightly.      saliva stimulant comb. no.3 (BIOTENE MOISTURIZING MOUTH) Spry 1 spray every two (2) hours as needed.      sertraline (ZOLOFT) 50 MG tablet Take 1 tablet (50 mg total) by mouth daily.      valproic acid (DEPAKENE) 250 mg capsule Take 4 capsules (1,000 mg total) by mouth in the morning and 4 capsules (1,000 mg total) in the evening.       No facility-administered encounter medications on file as of 10/31/2022.       SOCIAL HISTORY:   baseline cognitive impairment, lives in long term care facility     FAMILY HISTORY:   I was not able to get a reliable family history.    PHYSICAL EXAM:   BP 117/66 (BP Site: L Arm, BP Position: Sitting)  - Pulse 103  - Temp 36.1 ??C (97 ??F) (Temporal)  - Ht 170.2 cm (5' 7.01)  - Wt (!) 147.4 kg (325 lb)  - SpO2 95%  - BMI 50.89 kg/m??     General Appearance:    Alert, cheerful, normally interactive, well-appearing.  Sitting in wheelchair.  I am not able to get a history from her as she is not knowledgeable about her medical issues and her responses to questions a difficult if not impossible to understand.   Extremities: No edema.  No difference in circumference between right and left calf.         LABS:      No labs obtained.  However, labs in epic reviewed: CBC in September 2024 showed WBC 12.6, hemoglobin 11.5, MCV 89 platelets 368, creatinine     ASSESSMENT:      Venous thromboembolism: Bilateral low risk PE diagnosed 03/29/2022-report available to me (PE within subsegmental and segmental branches  bilateral upper and lower lobes, some with chronic appearance and wall adherent.  No evidence of right heart strain).  VTE risk factors: A.  Partial immobility, B. CML.  CML increases the risk of VTE: According to a meta-analysis and systematic review, the incidence rate of VTE in patients with CML is approximately 13% (95% CI: 1-36%). This elevated risk is consistent with findings in other hematologic malignancies. Also, the ASH guidelines highlight that patients with hematologic malignancies, including CML, are at increased risk for VTE compared to the general population. I would not consider imatinib as the VTE risk factor  - while there  are isolated reports of thrombotic events in patients on imatinib, these are rare and not sufficient to establish a direct causal relationship.     I am not able to get any meaningful history from the patient today, but based on her VTE history and her underlying CML I think continued anticoagulation is appropriate as long as she tolerates it well.  Given her body weight of 147 kg and the body mass index of 50.9, I would recommend to continue Eliquis 5 mg twice daily long-term, as long as she tolerates it well.  No follow-up with me is needed.    CML: Dr. 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 She began treatment on imatinib in 12/2015 with optimal response and meeting appropriate treatment milestones, except her one year PCR, which was slightly above goal.  She has been in a MMR and MR4.0 since 05/2018 until Jan 2021 when she lost MMR d/t caregiver change and missing medication. She was able to achieve MMR again until Oct 2022. She has had stable transcripts since Dec 2022, until her most recent level in June 2023 when it was discovered that she was not receiving her medication daily at her home. Her level did improve to a more normal state, however despite her facility giving her the medication daily, per the [facility], her BCR-ABL transcripts stayed elevated at  0.551 %. A BCR-ABL mutational panel was drawn which did not reveal the presence of any new mutations. Therefore the decision was made to switch her to Bosutinib 400 mg. She presents today approximately 2 weeks after starting the medication for repeat labs and a CPP/APP visit.     PLAN:      Eliquis 5 mg twice daily long-term  No follow-up with me was arranged  Follow-up with malignant hematology for CML        were spent by me personally face-to-face with the patient. The direct patient care time included face-to-face time with the patient, taking a history, examining the patient, reviewing records, communicating with other health care professionals about the patient, and explaining the various issues involved in the patient's care. More than 50 % of the time was spent counseling the patient regarding the pt's medical issues issues and coordinating the pt's  care.            Cherie Dark, MD  Professor of Medicine  Division of Hematology  [facility]  [id number]  [place], [place] [address]    Clinic  Main number: [phone]  Fax: [phone]  Certified Medical Assistant: Anders Grant  Schedulers: Cordella Register, Deanne Coffer, Babs Bertin  Nurse: Macky Lower  Physician Assistant: Wynona Canes    [email or web address]          This note was dictated with Dragon Medical and may contain typos missed during proofreading.

## 2022-11-03 NOTE — Unmapped (Signed)
[facility] Refill Coordination Note    Specialty Medication(s) to be Shipped:   Hematology/Oncology: Bosulif    Other medication(s) to be shipped: No additional medications requested for fill at this time     Kimberly Mathis, DOB: 1965/03/28  Phone: [phone] (home)       All above HIPAA information was verified with patient's caregiver, Kimberly Mathis      Was a translator used for this call? No    Completed refill call assessment today to schedule patient's medication shipment from the [facility]  [phone]).  All relevant notes have been reviewed.     Specialty medication(s) and dose(s) confirmed: Regimen is correct and unchanged.   Changes to medications: Kimberly Mathis reports no changes at this time.  Changes to insurance: No  New side effects reported not previously addressed with a pharmacist or physician: None reported  Questions for the pharmacist: No    Confirmed patient received a Conservation officer, historic buildings and a Surveyor, mining with first shipment. The patient will receive a drug information handout for each medication shipped and additional FDA Medication Guides as required.       DISEASE/MEDICATION-SPECIFIC INFORMATION        N/A    SPECIALTY MEDICATION ADHERENCE     Medication Adherence    Patient reported X missed doses in the last month: 0  Specialty Medication: BOSULIF 400 mg Tab (bosutinib)  Patient is on additional specialty medications: No  Patient is on more than two specialty medications: No  Any gaps in refill history greater than 2 weeks in the last 3 months: no  Demonstrates understanding of importance of adherence: yes  Informant: caregiver  Reliability of informant: reliable  Provider-estimated medication adherence level: good  Patient is at risk for Non-Adherence: No  Reasons for non-adherence: no problems identified  Support network for adherence: home health agency              Were doses missed due to medication being on hold? No     BOSULIF 400 mg Tab (bosutinib) : 4 days of medicine on hand       REFERRAL TO PHARMACIST     Referral to the pharmacist: Not needed      [facility]     Shipping address confirmed in Epic.       Delivery Scheduled: Yes, Expected medication delivery date: 11/07/22.     Medication will be delivered via Same Day Courier to the prescription address in Epic WAM.    [facility]  Specialty Technician

## 2022-11-06 NOTE — Unmapped (Signed)
Patient Kimberly Mathis was called in attempt number 1 to reach them regarding rescheduling their missed appointments on 10/31/22.     The call resulted in: Message left    Please reschedule the patient upon their return call on next available for the following:     Lab Appointment and APP with [name]    Thank you,    Janyth Pupa

## 2022-11-07 MED FILL — BOSULIF 400 MG TABLET: ORAL | 30 days supply | Qty: 30 | Fill #2

## 2022-11-10 NOTE — Unmapped (Unsigned)
[facility]ukemia [facility] Follow-up    Patient Name: Kimberly Mathis  Patient Age: 57 y.o.  Encounter Date: 11/13/2022    Primary Care Provider:  Desiree Lucy, PA    Referring Physician:  Haig Prophet, PA  [address]  [place],  [place] [address]    Reason for visit:   F/u visit for [facility]    Assessment:  Kimberly Mathis is a 57 y.o. 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 She began treatment on imatinib in 12/2015 with optimal response and meeting appropriate treatment milestones, except her one year PCR, which was slightly above goal.  She has been in a MMR and MR4.0 since 05/2018 until Jan 2021 when she lost MMR d/t caregiver change and missing medication. She was able to achieve MMR again until Oct 2022. She has had stable transcripts since Dec 2022, until her most recent level in June 2023 when it was discovered that she was not receiving her medication daily at her home. Her level did improve to a more normal state, however despite her facility giving her the medication daily, per the [facility], her BCR-ABL transcripts stayed elevated at  0.551 %. A BCR-ABL mutational panel was drawn which did not reveal the presence of any new mutations. Therefore the decision was made to switch her to Bosutinib 400 mg. She presents today approximately 6 weeks after starting the medication for repeat labs and a CPP/APP visit.     Kimberly Mathis presents to clinic today alone, without a caregiver or aid.  She continues to complain of knee pain that has been longstanding. She reports some mild intermittent stomach discomfort, but denies any nausea/vomiting, or diarrhea/constipation. Otherwise denies any new shortness of breath or increased side effects from her new medication. Kimberly Mathis brought with her just a medication list without a MAR. I will call her facility to review her medications and have a recent [facility] sent to [place] to ensure that she is receiving her medication everyday.I did confirm that they are only giving her Bosutinib and not Imatinib, which they were doing for a couple of weeks.  On lab review today. CBC is fairly stable with WBC 12.6, Hgb 11.5, Plt 268, ANC 6.8. CMP is WNL. BCR-ABL is drawn today and is pending as it has been 3 months since her last value.     I again re-iterated to the Kimberly Mathis the Director of Nursing, and the facility that she needs a CNA or Aid to present with her at all times for safety reasons. Although I continue to request this, she has still presented to every appointment alone over the past few months. Will make the patients guardian,  Kimberly Mathis aware of this, as it continues to be a safety concern.  I strongly advised and insisted that patient come to all of her appointments in the future with a caregiver or aid from her home as well as a full MAR from the past 3 months. RTC in 6 weeks to check in after 3 months on Bosutinib.     History of PE:  She had an admission back in Kimberly Mathis 2024 for a lethargy and shortness of breath, and was diagnosed with  pulmonary emboli within subsegmental and segmental branches to the bilateral upper and lower lobes. She was discharged on 4/1 and has been stable on Eliquis 5 mg twice a day. She has a new patient appointment with Benign Hematology in  October to determine duration.   - Continue Eliquis 5 mg twice a day    Plan and Recommendations:  - Continue Bosutinib 400 mg daily  - Discussed with facility to ensure that patient has an aid sent with her when coming to these appointments  - Will reach out to patient's guardian with updates and to inform her that the patient continues to come without an aid  - Recommended full and updated MAR from the past 3 months when patient comes to appointments    - Patients facility will fax over  - Continue Eliquis 5 mg twice daily for hx of PE, patient will follow up with benign hematology in October   - RTC in 6 weeks after 3 months on Bosutinib    Dr. Vertell Limber was available    [name] PA-C  Physician Assistant   Hematology/Oncology Division  [facility]  11/13/2022    I personally spent 40 minutes face-to-face and non-face-to-face in the care of this patient, which includes all pre, intra, and post visit time on the date of service.    History of Present Illness:  Hematology/Oncology History Overview Note   Diagnosis: CML    SOKAL Score:    Presentation: asymptomatic; leukocytosis found on routine CBC    Presenting WBC Count: 17,400    Bone Marrow Biopsy: not performed as patient unable to tolerate procedure.    Cytogenetics:  Abnormal FISH: An interphase FISH assay shows an abnormal signal pattern consistent with BCR/ABL1 fusion in 44% of the 100 cells scored. Of note, these cells contain an atypical abnormal signal pattern consistent with BCR/ABL1 rearrangement and loss of the ABL1/ASS1 region.    Treatment:  Imatinib 400 mg daily- 12/31/2015    12/31/15:  BCR-ABL1 p210 transcripts were detected at a level of 9.511 IS% ratio in blood.  BCR-ABL1 p190 transcripts were detected at a level of 3 in 100,000 cells in blood.    03/23/16:  BCR-ABL1 p210 transcripts were detected at a level of 0.702 IS % ratio in blood.    Normal FISH:  A BCR/ABL1 interphase FISH assay for the previously documented 9;22 translocation shows a normal signal pattern in 100% of the 200 nuclei scored     06/15/16:  BCR-ABL1 p210 transcripts were detected at a level of 0.272 IS % ratio in blood.    09/2016:  0.298 IS %    12/18: 0.241 IS%    03/22/17: 0.072%  06/21/17: 0.052%  09/27/17: 0.125%  01/03/18: 0.040%  05/28/18: 0.008%  09/13/18: 0.003%  01/14/19: 0.181%  02/05/19: 0.149%  04/10/19: 0.037%  09/03/19: 0.040%  12/04/19: 0.004%  03/04/20: 0.032%  06/30/20: 0.018%  10/07/20: 0.307%  12/23/20: 0.351%  06/02/21: 3.311%  09/07/2021:  0.887%  12/07/2021: 2.530%  03/14/2022: 0. 496%  06/13/2022: 0.551%    07/28/2022: Started Bosutinib 400 mg (was being given Imatinib 400 mg as well per error from her facility)    08/14/2022: STOPPED Imatinib 400 mg (given instructions to continue Bosutinib 400 mg )     CML (chronic myelocytic leukemia) (CMS-HCC)   12/28/2015 Initial Diagnosis    CML (chronic myelocytic leukemia) (RAF-HCC)         Interval History:  Ms. Alfred presents to clinic today alone without an aid. She reports that she is doing okay besides her continued knee and back pain.She has been having some increased stomach pains in her left lower side. States they are only intermittent and without a clear trigger. Denies any fevers, constipation, or diarrhea. Is  not really sure if the Bosutinib is triggering it or another medications. Reports good energy recently. Denies any shortness of breath, feels her breathing has been okay.  She denies any n/v/d. Denies any new cough. Energy is stable. History difficult to obtain as patient presents alone without an aid or caregiver.     Past Medical, Surgical and Family History were reviewed and pertinent updates were made in the Electronic Medical Record    Review of Systems:  All other systems reviewed were negative.     ECOG Performance Status: 2    Past Medical History:  Past Medical History:   Diagnosis Date    GERD (gastroesophageal reflux disease)     Gout     HTN (hypertension)     Morbid obesity (CMS-HCC)     Osteoporosis     Schizoaffective disorder, bipolar type (CMS-HCC)     Type II diabetes mellitus (CMS-HCC)        Medications:  Current Outpatient Medications   Medication Sig Dispense Refill    acetaminophen (TYLENOL) 325 MG tablet Take 1 tablet (325 mg total) by mouth every six (6) hours as needed for fever or pain.      allopurinoL (ZYLOPRIM) 100 MG tablet Take 1 tablet (100 mg total) by mouth daily.      apixaban (ELIQUIS) 5 mg Tab Take 1 tablet (5 mg total) by mouth two (2) times a day.      bisacodyL (DULCOLAX) 5 mg EC tablet Take 2 tablets (10 mg total) by mouth daily as needed for constipation.      bosutinib 400 mg Tab Take 1 tablet (400 mg) by mouth daily. Administer with food. Swallow tablet whole; do not cut, crush, break, or chew. 30 tablet 5    clozapine (CLOZARIL) 100 MG tablet Take 1.5 tablets (150 mg total) by mouth daily. 150 mg in the AM and 300 mg at bedtime      diclofenac sodium (VOLTAREN) 1 % gel       famotidine (PEPCID) 20 MG tablet Take 1 tablet (20 mg total) by mouth two (2) times a day.      hydrALAZINE (APRESOLINE) 10 MG tablet Take 1 tablet (10 mg total) by mouth Three (3) times a day.      melatonin 5 mg cap Take 1 capsule by mouth nightly.      metFORMIN (GLUCOPHAGE) 1000 MG tablet Take 0.5 tablets (500 mg total) by mouth daily before breakfast. 1000 mg in the AM and 500 mg at bedtime      metoPROLOL tartrate (LOPRESSOR) 25 MG tablet Take 1 tablet (25 mg total) by mouth two (2) times a day.      multivitamin (TAB-A-VITE/THERAGRAN) per tablet Take 1 capsule by mouth daily.      ondansetron (ZOFRAN) 4 MG tablet Take 1 tablet (4 mg total) by mouth every eight (8) hours as needed for nausea.      pravastatin (PRAVACHOL) 20 MG tablet Take 1 tablet (20 mg total) by mouth at bedtime.      QUEtiapine (SEROQUEL) 50 MG tablet Take 1 tablet (50 mg total) by mouth nightly.      saliva stimulant comb. no.3 (BIOTENE MOISTURIZING MOUTH) Spry 1 spray every two (2) hours as needed.      sertraline (ZOLOFT) 50 MG tablet Take 1 tablet (50 mg total) by mouth daily.      valproic acid (DEPAKENE) 250 mg capsule Take 4 capsules (1,000 mg total) by mouth in the morning and 4 capsules (  1,000 mg total) in the evening.       No current facility-administered medications for this visit.     Vital Signs:  BSA: There is no height or weight on file to calculate BSA.  There were no vitals filed for this visit.    Physical Exam:  Constitutional: Obese woman sitting in wheelchair, no acute distress  Eyes: PERRL. No scleral icterus or conjunctival injection.  Cardiovascular:  Regular rate, regular rhythm S1, S2.  No murmurs, gallops or rubs.   Respiratory:  Breathing is unlabored.  CTAB. No rales, ronchi or crackles.  Breath sounds mildly diminished throughout all lung fields.   GI:  No distention or pain on palpation.  Bowel sounds are present.  .  Musculoskeletal:   strength equal bilaterally  Skin:  No rashes, petechiae or purpura.  grossly intact.   Neurologic:  alert and oriented to person/place, forced speech  Psychiatric: tangential requiring frequent re-direction    Relevant Laboratory, radiology and pathology results:  No visits with results within 1 Day(s) from this visit.   Latest known visit with results is:   Lab on 09/21/2022   Component Date Value Ref Range Status    Sodium 09/21/2022 140  135 - 145 mmol/L Final    Potassium 09/21/2022 3.7  3.4 - 4.8 mmol/L Final    Chloride 09/21/2022 103  98 - 107 mmol/L Final    CO2 09/21/2022 32.0 (H)  20.0 - 31.0 mmol/L Final    Anion Gap 09/21/2022 5  5 - 14 mmol/L Final    BUN 09/21/2022 7 (L)  9 - 23 mg/dL Final    Creatinine 08/65/7846 0.51 (L)  0.55 - 1.02 mg/dL Final    BUN/Creatinine Ratio 09/21/2022 14   Final    eGFR CKD-EPI (2021) Female 09/21/2022 >90  >=60 mL/min/1.66m2 Final    eGFR calculated with CKD-EPI 2021 equation in accordance with [employer] and [employer] of Nephrology Task Force recommendations.    Glucose 09/21/2022 108  70 - 179 mg/dL Final    Calcium 96/29/5284 9.5  8.7 - 10.4 mg/dL Final    Albumin 13/24/4010 4.1  3.4 - 5.0 g/dL Final    Total Protein 09/21/2022 8.5 (H)  5.7 - 8.2 g/dL Final    Total Bilirubin 09/21/2022 0.2 (L)  0.3 - 1.2 mg/dL Final    Mathis 27/25/3664 32  <=34 U/L Final    ALT 09/21/2022 30  10 - 49 U/L Final    Alkaline Phosphatase 09/21/2022 78  46 - 116 U/L Final    Specimen Type 09/21/2022    Final                    Value:Blood    BCR::ABL1 p210 Assay 09/21/2022 Positive   Final    BCR::ABL1 p210 IS% Ratio 09/21/2022 0.023  <0.001 IS% Ratio Final    BCR/ABL1 p210 Assay Results 09/21/2022 Final                    Value:RESULTS:  BCR::ABL1 p210 transcripts were detected at a level of 0.023 IS % ratio in blood.    INTERPRETATION:  Detection of BCR::ABL1 p210 RNA suggests the presence of cells containing t(9;22). This result is not significantly different from the previous level of 0.551 IS % ratio in blood on 06/13/2022.     COMMENT:  In serial measurements, a ten-fold change is considered to be clinically significant.      REFERENCES:  Melo JV. Baillieres Clin  Haematol. 1997 Jun;10(2):203-22. PMID: [id number]  Ronny Flurry, et al. Clin Cancer Res. 2005 May 1;11(9):3425-32. PMID: [id number]  NCCN Clinical Practice Guidelines [email or web address]   Dominga Ferry, et al. Mod Pathol. 2004 Jan;17(1):96-103. PMID: [id number]  Melanie Crazier, et al. Leukemia. 2003 Dec;17(12):2318-57. PMID: [id number]    METHOD:  Reverse transcription polymerase chain reaction was performed on QuantStudio Dx instrument using primers and TaqMan probe targeting BCR::ABL1 p210 and ABL1 control gene cDNA. Assay sensitivity is estimated at one cell per 100,000                           cells which is equivalent to 0.001 IS % ratio on the international scale. The reference range for this test is: No BCR::ABL1 p210 RNA detected.    This test was developed and its performance characteristics determined by the [facility]. It has not been cleared by the [place] Food and Drug Administration. However, such approval is not required for clinical implementation, and test results have been shown to be clinically useful. This laboratory is CAP accredited and CLIA certified to perform high complexity testing.                  Electronically Signed by: Hardie Shackleton, MD on 09/27/22 at 12:24 PM      WBC 09/21/2022 12.6 (H)  3.6 - 11.2 10*9/L Final    RBC 09/21/2022 3.87 (L)  3.95 - 5.13 10*12/L Final    HGB 09/21/2022 11.5  11.3 - 14.9 g/dL Final    HCT 47/42/5956 34.5  34.0 - 44.0 % Final    MCV 09/21/2022 89.0  77.6 - 95.7 fL Final    MCH 09/21/2022 29.6  25.9 - 32.4 pg Final    MCHC 09/21/2022 33.2  32.0 - 36.0 g/dL Final    RDW 38/75/6433 15.6 (H)  12.2 - 15.2 % Final    MPV 09/21/2022 8.4  6.8 - 10.7 fL Final    Platelet 09/21/2022 268  150 - 450 10*9/L Final    Neutrophils % 09/21/2022 53.9  % Final    Lymphocytes % 09/21/2022 34.3  % Final    Monocytes % 09/21/2022 10.1  % Final    Eosinophils % 09/21/2022 0.9  % Final    Basophils % 09/21/2022 0.8  % Final    Absolute Neutrophils 09/21/2022 6.8  1.8 - 7.8 10*9/L Final    Absolute Lymphocytes 09/21/2022 4.3 (H)  1.1 - 3.6 10*9/L Final    Absolute Monocytes 09/21/2022 1.3 (H)  0.3 - 0.8 10*9/L Final    Absolute Eosinophils 09/21/2022 0.1  0.0 - 0.5 10*9/L Final    Absolute Basophils 09/21/2022 0.1  0.0 - 0.1 10*9/L Final

## 2022-11-12 ENCOUNTER — Ambulatory Visit
Admit: 2022-11-12 | Discharge: 2022-11-14 | Disposition: A | Discharge status: Skilled Nursing Facility | Payer: MEDICARE | Source: Intra-hospital | Admitting: Medical

## 2022-11-12 LAB — COMPREHENSIVE METABOLIC PANEL
ALBUMIN: 3.3 g/dL — ABNORMAL LOW (ref 3.4–5.0)
ALKALINE PHOSPHATASE: 78 U/L (ref 46–116)
ALT (SGPT): 18 U/L (ref 7–40)
ANION GAP: 9 mmol/L (ref 3–11)
AST (SGOT): 21 U/L (ref 13–40)
BILIRUBIN TOTAL: 0.2 mg/dL — ABNORMAL LOW (ref 0.3–1.2)
BLOOD UREA NITROGEN: 4 mg/dL — ABNORMAL LOW (ref 9–23)
BUN / CREAT RATIO: 7
CALCIUM: 9.3 mg/dL (ref 8.7–10.4)
CHLORIDE: 98 mmol/L (ref 98–107)
CO2: 34.1 mmol/L — ABNORMAL HIGH (ref 20.0–31.0)
CREATININE: 0.57 mg/dL (ref 0.50–0.80)
EGFR CKD-EPI (2021) FEMALE: >90 mL/min/{1.73_m2} (ref >=60)
GLUCOSE RANDOM: 154 mg/dL (ref 70–179)
POTASSIUM: 3.5 mmol/L (ref 3.5–5.1)
PROTEIN TOTAL: 9.8 g/dL — ABNORMAL HIGH (ref 5.7–8.2)
SODIUM: 141 mmol/L (ref 136–145)

## 2022-11-12 LAB — DRUG SCREEN, URINE
AMPHETAMINE SCREEN URINE: NEGATIVE
BARBITURATE SCREEN URINE: NEGATIVE
BENZODIAZEPINE SCREEN, URINE: NEGATIVE
CANNABINOID SCREEN URINE: NEGATIVE
COCAINE(METAB.)SCREEN, URINE: NEGATIVE
METHADONE SCREEN, URINE: NEGATIVE
OPIATE SCREEN URINE: NEGATIVE
OXYCODONE SCREEN URINE: NEGATIVE

## 2022-11-12 LAB — URINALYSIS WITH MICROSCOPY
BILIRUBIN UA: NEGATIVE
BLOOD UA: NEGATIVE
GLUCOSE UA: NEGATIVE
NITRITE UA: NEGATIVE
PH UA: 7 (ref 5.0–8.0)
RBC UA: 4 /HPF — ABNORMAL HIGH (ref 0–3)
SPECIFIC GRAVITY UA: 1.01 (ref 1.005–<1.030)
SQUAMOUS EPITHELIAL: <1 /HPF (ref 0–5)
WBC UA: 17 /HPF — ABNORMAL HIGH (ref 0–2)

## 2022-11-12 LAB — CBC W/ AUTO DIFF
BASOPHILS ABSOLUTE COUNT: 0.1 10*9/L (ref 0.0–0.1)
BASOPHILS RELATIVE PERCENT: 0.4 %
EOSINOPHILS ABSOLUTE COUNT: 0.1 10*9/L (ref 0.0–0.5)
EOSINOPHILS RELATIVE PERCENT: 0.7 %
HEMATOCRIT: 33.6 % — ABNORMAL LOW (ref 34.0–44.0)
HEMOGLOBIN: 11.1 g/dL — ABNORMAL LOW (ref 11.3–14.9)
LYMPHOCYTES ABSOLUTE COUNT: 3.5 10*9/L (ref 1.1–3.6)
LYMPHOCYTES RELATIVE PERCENT: 22.7 %
MEAN CORPUSCULAR HEMOGLOBIN CONC: 33 g/dL (ref 32.0–36.0)
MEAN CORPUSCULAR HEMOGLOBIN: 29.2 pg (ref 25.9–32.4)
MEAN CORPUSCULAR VOLUME: 88.4 fL (ref 77.6–95.7)
MEAN PLATELET VOLUME: 7.5 fL (ref 6.8–10.7)
MONOCYTES ABSOLUTE COUNT: 2.1 10*9/L — ABNORMAL HIGH (ref 0.3–0.8)
MONOCYTES RELATIVE PERCENT: 13.6 %
NEUTROPHILS ABSOLUTE COUNT: 9.7 10*9/L — ABNORMAL HIGH (ref 1.8–7.8)
NEUTROPHILS RELATIVE PERCENT: 62.6 %
PLATELET COUNT: 434 10*9/L (ref 150–450)
RED BLOOD CELL COUNT: 3.8 10*12/L — ABNORMAL LOW (ref 3.95–5.13)
RED CELL DISTRIBUTION WIDTH: 14.3 % (ref 12.2–15.2)
WBC ADJUSTED: 15.5 10*9/L — ABNORMAL HIGH (ref 3.6–11.2)

## 2022-11-12 LAB — MAGNESIUM: MAGNESIUM: 2.2 mg/dL (ref 1.6–2.6)

## 2022-11-12 LAB — TSH: THYROID STIMULATING HORMONE: 1.869 u[IU]/mL (ref 0.550–4.780)

## 2022-11-12 LAB — HEMOGLOBIN A1C
ESTIMATED AVERAGE GLUCOSE: 151 mg/dL
HEMOGLOBIN A1C: 6.9 % — ABNORMAL HIGH (ref 4.8–5.6)

## 2022-11-12 LAB — VITAMIN B12: VITAMIN B-12: 880 pg/mL (ref 211–911)

## 2022-11-12 LAB — HIGH SENSITIVITY TROPONIN I - SINGLE: HIGH SENSITIVITY TROPONIN I: 5 ng/L (ref <=34)

## 2022-11-12 LAB — BLOOD GAS, ARTERIAL
BASE EXCESS ARTERIAL: 9.4 — ABNORMAL HIGH (ref -2.4–2.3)
FIO2 ARTERIAL: 24
HCO3 ARTERIAL: 32 mmol/L — ABNORMAL HIGH (ref 21–28)
O2 SATURATION ARTERIAL: 90.2 % — ABNORMAL LOW (ref 92.0–100.0)
PCO2 ARTERIAL: 56.4 mmHg — ABNORMAL HIGH (ref 35.0–45.0)
PH ARTERIAL: 7.39 (ref 7.35–7.45)
PO2 ARTERIAL: 60.2 mmHg — ABNORMAL LOW (ref 83.0–108.0)

## 2022-11-12 LAB — ETHANOL: ETHANOL: <3 mg/dL (ref <=10)

## 2022-11-12 LAB — CK: CREATINE KINASE TOTAL: 107 U/L (ref 34.0–145.0)

## 2022-11-12 MED ADMIN — insulin lispro (HumaLOG) injection 0-20 Units: 0-20 [IU] | SUBCUTANEOUS | @ 14:00:00

## 2022-11-12 MED ADMIN — sertraline (ZOLOFT) tablet 50 mg: 50 mg | ORAL | @ 23:00:00

## 2022-11-12 MED ADMIN — cefTRIAXone (ROCEPHIN) FOR IV PUSH 1 g: 1 g | INTRAVENOUS | @ 09:00:00 | Stop: 2022-11-12

## 2022-11-12 MED ADMIN — iohexol (OMNIPAQUE) 350 mg iodine/mL solution 75 mL: 75 mL | INTRAVENOUS | @ 07:00:00 | Stop: 2022-11-12

## 2022-11-12 MED ADMIN — apixaban (ELIQUIS) tablet 5 mg: 5 mg | ORAL | @ 14:00:00

## 2022-11-12 MED ADMIN — acetaminophen (TYLENOL) tablet 650 mg: 650 mg | ORAL | @ 14:00:00

## 2022-11-12 NOTE — Unmapped (Signed)
BIB by EMS for lethargy and fall from [facility]. EMS called and facility was unaware of her time down or if she LOC. Pt has a history cognitive communication deficit, muscle weakness, and chronic myeloid leukemia, adjustment insomnia, Type IIDM. Pt snoring upon arrival. BG with EMS was stable. Upon arrival, pt's O2 was 89% on RA. Pt on 2LNC with sats at 96%. Can not get patient to respond but will arouse to painful stimuli. Pt is on blood thinners.

## 2022-11-12 NOTE — Unmapped (Signed)
[name] :    History & Physical      Date of Service:  11/12/22            Patient Care Team:  Desiree Lucy, PA as PCP - General (Family Medicine)  Leodis Liverpool as Nurse Practitioner (Hematology and Oncology)  Zeidner, Windell Norfolk, MD as Consulting Physician (Medical Oncology)    Assessment & Plan     Principal Problem:    Acute metabolic encephalopathy  Active Problems:    Morbid obesity (CMS-HCC)    Schizoaffective disorder, bipolar type (CMS-HCC)    CML (chronic myelocytic leukemia) (CMS-HCC)    Chronic pulmonary embolism without acute cor pulmonale (CMS-HCC)    Complicated UTI (urinary tract infection)    Obesity, Class III, BMI 40-49.9 (morbid obesity) (CMS-HCC)       Acute metabolic Encephalopathy  UTI  Patient with similar presentation in March and was found to have proteus UTI. Today has weakness, fatigue, AMS, UA suggestive of UTI. Will treat as complicated UTI. CO2 is high but probably chronic CO2 retainer, similar to prior labs. Has prior admissions with severe lethargy noted, unclear how far off she is from baseline. Chronic medications for schizoaffective disorder may also be having some sedating effects. Will admit for IV abx, follow urine culture. PT/OT once she is more alert.    Chronic Hypercapnic Respiratory Failure  Hypoxia  No respiratory distress, says she uses O2 sometimes, per prior admission notes, had witnessed apnea on previous admission while sleeping. Morbidly obese. CO2 elevated similar to prior labs with metabolic compensation. Likely has underlying sleep apnea, obesity hypoventilation syndrome. Per last admission notes, she was refusing CPAP during admission, she has agreed to try it now. Will put on nocturnal CPAP, wean O2 when awake.    Chronic PE  Continue Eliquis. Recent  CT chest on 10/18 with chronic PE. Per hematology note, continue eliquis indefinitely. Continue eliquis.    CML  Hem/onc notes reviewed, recently switched to bosutinib. Continue bosutinib.    Schizoaffective Disorder  Med rec pending, these medications could be contributing to lethargy, falls. Med rec pending, resume home meds once med rec complete.    HTN  Currently normotensive, resume home meds as appropriate once med rec complete.    DM2  SSI, A1c, hold home meds.    Morbid Obesity  Body mass index is 43.5 kg/m??. Contirnubtes to morbidity, mortality, complicated care.      DVT prophylaxis:  on other anticoagulant (apixaban, rivaroxaban, dabigatran, prasugrel)  Anticipated disposition: To Skilled Nursing Facility  Estimated discharge:  2-3 days    Inpatient management is neccesary.  Due to the patient's severity of illness and comorbidities, evaluation and management is expected to require 2 or more midnights in the acute setting.    Code Status   Full Code  Discussed with patient    Chief Complaint      Fall, Fatigue    History Of Present Illness     Kimberly Mathis is a 57 y.o.  female with PMH of schizoaffective disorder, PE, CML who presents to [facility] for fall. She was sent from SNF for fall, unknown LOC.  She complains of some chronic pain in her back neck, knees, but no acute pain or injury from fall. She says she feels very tired. Denies dyspnea, chest pain, palpitations, dysuria, diarrhea, abdominal pain, nausea, vomiting, fever, chills. She is unable to tell me how she fell, but says she fell  on her back and was unable to get up. Per facility, downtime was unknown. For EMS she was hypoxic on room air and placed on supplemental O2. She says she wears some supplemental O2 somteimes, denies using CPAP. In ED,VSS, normal O2 sats on 2lpm O2. Labs with leukocytosis, VBG with hypercapnia with metabolic compensation, UA suggestive of UTI. Otherwise unremarkable CMP, trop, BNP, lactic acid. No acute findings on CT head, CTA head/neck, CXR.    Review Of Systems   Pertinent positives and negatives noted in HPI all other systems reviewed and negative.      Allergies   Patient has no known allergies.    Home Medications     Prior to Admission medications    Medication Dose, Route, Frequency   acetaminophen (TYLENOL) 325 MG tablet Take 1 tablet (325 mg total) by mouth every six (6) hours as needed for fever or pain.   allopurinoL (ZYLOPRIM) 100 MG tablet 100 mg, Oral, Daily (standard)   apixaban (ELIQUIS) 5 mg Tab 5 mg, Oral, 2 times a day (standard)   bisacodyL (DULCOLAX) 5 mg EC tablet 10 mg, Oral, Daily PRN   bosutinib 400 mg Tab Take 1 tablet (400 mg) by mouth daily. Administer with food. Swallow tablet whole; do not cut, crush, break, or chew.   clozapine (CLOZARIL) 100 MG tablet 150 mg, Oral, Daily (standard), 150 mg in the AM and 300 mg at bedtime   diclofenac sodium (VOLTAREN) 1 % gel No dose, route, or frequency recorded.   famotidine (PEPCID) 20 MG tablet 20 mg, Oral, 2 times a day (standard)   hydrALAZINE (APRESOLINE) 10 MG tablet 10 mg, Oral, 3 times a day (standard)   melatonin 5 mg cap 1 capsule, Oral, Nightly   metFORMIN (GLUCOPHAGE) 1000 MG tablet 500 mg, Oral, Daily before breakfast, 1000 mg in the AM and 500 mg at bedtime   metoPROLOL tartrate (LOPRESSOR) 25 MG tablet 25 mg, Oral, 2 times a day (standard)   multivitamin (TAB-A-VITE/THERAGRAN) per tablet 1 capsule, Oral, Daily (standard)   ondansetron (ZOFRAN) 4 MG tablet 4 mg, Oral, Every 8 hours PRN   pravastatin (PRAVACHOL) 20 MG tablet 20 mg, Oral, At bedtime   QUEtiapine (SEROQUEL) 50 MG tablet 50 mg, Oral, Nightly   saliva stimulant comb. no.3 (BIOTENE MOISTURIZING MOUTH) Spry 1 spray, Every 2 hours PRN   sertraline (ZOLOFT) 50 MG tablet 50 mg, Oral, Daily (standard)   valproic acid (DEPAKENE) 250 mg capsule 1,000 mg, Oral, 2 times daily (RT)       Medical History     Past Medical History:   Diagnosis Date    GERD (gastroesophageal reflux disease)     Gout     HTN (hypertension)     Morbid obesity (CMS-HCC)     Osteoporosis     Schizoaffective disorder, bipolar type (CMS-HCC) Type II diabetes mellitus (CMS-HCC)          Surgical History   No past surgical history on file.     Social History     Social History     Socioeconomic History    Marital status: Single     Spouse name: None    Number of children: None    Years of education: None    Highest education level: None   Tobacco Use    Smoking status: Former     Current packs/day: 1.00     Types: Cigarettes    Smokeless tobacco: Never   Vaping Use    Vaping status: Never  Used   Substance and Sexual Activity    Alcohol use: Not Currently    Drug use: Not Currently     Social Determinants of Health     Financial Resource Strain: Low Risk  (04/01/2018)    Overall Financial Resource Strain (CARDIA)     Difficulty of Paying Living Expenses: Not very hard   Food Insecurity: No Food Insecurity (04/01/2018)    Hunger Vital Sign     Worried About Running Out of Food in the Last Year: Never true     Ran Out of Food in the Last Year: Never true   Transportation Needs: No Transportation Needs (04/01/2018)    PRAPARE - Therapist, art (Medical): No     Lack of Transportation (Non-Medical): No   Physical Activity: Unknown (04/01/2018)    Exercise Vital Sign     Days of Exercise per Week: Patient declined     Minutes of Exercise per Session: Patient declined   Social Connections: Unknown (04/01/2018)    Social Connection and Isolation Panel [NHANES]     Frequency of Communication with Friends and Family: Patient declined     Frequency of Social Gatherings with Friends and Family: Patient declined     Attends Religious Services: Patient declined     Database administrator or Organizations: Patient declined     Attends Banker Meetings: Patient declined     Marital Status: Patient declined        Family History   History reviewed. No pertinent family history.    No family status information on file.         Objective     Vitals:    11/12/22 0215 11/12/22 0230 11/12/22 0245 11/12/22 0330   BP:    125/88   Pulse: 108 108 109 107   Resp: 16 15 21 22    Temp:       TempSrc:       SpO2: 96% 97% 95% 97%   Weight:            General:  No distress.   Neuro:   Awake, alert. Oriented. Cooperative. Fluent speech. Pupils equal and reactive. MAEW  HEENT:  No scleral icterus. Moist mucus membranes. No thrush. No exudates. No thyromegaly. No JVD. No carotid bruits.   CV:   Regular rate and rhythm. No murmurs.   Pulmonary:  Lungs are clear to auscultation. No rales, ronchi, or wheezing.  Abdomen:  Soft. Nontender. Nondistended. Normal bowel sounds.  MSK:   No deformities. No tenderness to palpation. No LE edema.   GU:   No urinary catheter present.   SKIN:   No rashes. No jaundice. No petechiae. No ecchymosis.         Lab Results      CBC  Recent Labs     11/12/22  0105 11/12/22  0410   WBC 15.5*  --    HGB 11.1* 10.5*   HCT 33.6*  --    PLT 434  --       CMP  Recent Labs     11/12/22  0105   NA 141   K 3.5   CL 98   CO2 34.1*   BUN 4*   CREATININE 0.57   GLU 154   CALCIUM 9.3   ALBUMIN 3.3*   PROT 9.8*   BILITOT 0.2*   AST 21   ALT 18   ALKPHOS 78  OTHER LABS  Recent Labs     11/12/22  0105 11/12/22  0109   MG 2.2  --    LACTATE  --  0.8   CKTOTAL 107.0  --    TROPONINI 5  --    TSH 1.869  --    ETOH <3  --      No results for input(s): ESR, CRP, HSCRP, ANA, RF, DSDNA, VITAMINB12, FOLATE, IRON, LABIRON, TIBC, FERRITIN, RETIC, C3, C4, VITDTOTAL, LDH, HAPTM, URICACID, HEPP4, CEA, CA125, RAPSCRN, CDIFRPCR, CDIFFNAP1, HIV12SCRN, HIVCP, TBCELLQN, HEPAIGM, HEPBSAG, HEPBIGM, HEPCAB, MITOAB, HBEAG, IGM, IGA, IGG in the last 72 hours.     URINE  Recent Labs     11/12/22  0251   WBCUA 17*   NITRITE Negative   LEUKOCYTESUR 1+*   BACTERIA Rare*   RBCUA 4*   BLOODU Negative   GLUCOSEU Negative   PROTEINUA 1+*   KETONESU 1+*      BODY FLUIDS  No results for input(s): FTYP1, WBCFLUID, FNEUT, LYMPHSFL, FMONO, EOSFL, RBCFL, CLARITYFLUID, COLORFL, ALBFL, LDHFL, PROTEINFL, PHFL in the last 72 hours.    Invalid input(s): LDHTYPE, PROTFLTYPE   ABG  No results for input(s): O2SOUR, FIO2ART, PHART, PCO2ART, PO2ART, HCO3ART, O2SATART, BEART in the last 72 hours.    Recent Labs     Units 11/12/22  0008   SARSCOVID  Negative             Pending Labs     Pending Labs       Order Current Status    Urine culture In process            Imaging     ECG 12 Lead    Result Date: 11/12/2022  SINUS TACHYCARDIA SEPTAL INFARCT  , AGE UNDETERMINED ABNORMAL ECG WHEN COMPARED WITH ECG OF 29-Mar-2022 17:34, NONSPECIFIC T WAVE ABNORMALITY, WORSE IN LATERAL LEADS Confirmed by Laney Pastor ([id number]) on 11/12/2022 4:31:08 AM    XR Chest Portable    Result Date: 11/12/2022  Exam:  Portable Chest History:  Altered mental status. Technique:  Single frontal view. Comparison:  CTA of the chest dated 10/21/2022 Findings:   Low lung volumes are present, which accentuate the cardiac silhouette and pulmonary vasculature, and causes crowding of the bronchovascular markings at the bases. Enlarged cardiac silhouette. No focal consolidation, pulmonary edema, pleural effusion, or pneumothorax.     Low lung volumes. No focal consolidation. Signed (Electronic Signature): 11/12/2022 2:12 AM Signed By: Bettye Boeck, MD    CTA Head And Neck W Contrast    Result Date: 11/12/2022  ========================================= THIS IS A PRELIMINARY REPORT ONLY.  PLEASE REFER TO THE FORMAL, FINALIZED REPORT FOR THIS EXAM THAT WILL FOLLOW. Exam:  CTA of the head and neck History:  Altered mental status Comparison:  CT brain dated 11/12/2022     PRELIMINARY:  No large vessel occlusion in the head or neck. THIS IS A PRELIMINARY REPORT ONLY.  PLEASE REFER TO THE FORMAL, FINALIZED REPORT FOR THIS EXAM THAT WILL FOLLOW. =========================================    CT head without contrast    Result Date: 11/12/2022  Exam:  CT Head without Contrast History:  Fall, altered mental status Technique: Routine brain CT without IV contrast. AEC (automated exposure control) and/or manual techniques such as size-specific kV and mAs are employed where appropriate to reduce radiation exposure for all CT exams. Comparison:  None. Findings:  BRAIN:  No CT evidence of acute infarction, hemorrhage, edema, mass or mass effect. Ventricles and basilar cisterns are unremarkable. SOFT  TISSUES:  Negative. CALVARIUM:  Negative. No fracture. SINUSES AND MASTOIDS:  No mucosal thickening or fluid.     Negative head CT without contrast. Signed (Electronic Signature): 11/12/2022 2:01 AM Signed By: Bettye Boeck, MD     ECG     sinus tachycardia      Medical Decision Making    Problems: This Patient has:   Moderate  - 2 or more stable chronic illnesses HTN, schizoaffective disroder, DM2  - 1 acute illness with systemic symptoms - complicated UTI    Data Review (1 for moderate, 2 of 3 for high) - I have:  -3 of the following  - Reviewed External Notes : none  - Ordered Unique tests:CBC, BMP, Mg, A1c, b12  - Reviewed Unique Test Results.Cbc, CMP, trop, BNP, TSH, UA, respiratory panel, CT head, CTA head/neck, CXR  - Required independent Historians: none  - Independently interpreted tests performed by another health care professional:  ED Labs and Imaging Reviewed - Findings summarized in HPI  - Discussion with external physician: ED provider    Risk  High:  - Decision regarding hospitalization or escalation of care    11/12/2022   Patient is at  high risk of morbidity and has high complexity due to Principal Problem:    Acute metabolic encephalopathy  Active Problems:    Morbid obesity (CMS-HCC)    Schizoaffective disorder, bipolar type (CMS-HCC)    CML (chronic myelocytic leukemia) (CMS-HCC)    Chronic pulmonary embolism without acute cor pulmonale (CMS-HCC)    Complicated UTI (urinary tract infection)    Obesity, Class III, BMI 40-49.9 (morbid obesity) (CMS-HCC)     (insert conditions).   I discussed the case with:Emergency Department Treatment: Medication monitoring with an acute illness/potential drug toxicity related to antibiotic  Two chronic conditions with chronic medication adjustment   Labs/Imaging:  lab specific test/value: Cbc, CMP, trop, BNP, TSH, UA, respiratory panel  Chest x-ray  Head CT  CTA head/neck    **Only need to address 2 of 3, but must address high or moderate MDM      Carmin Richmond MD  HOSPITALIST

## 2022-11-12 NOTE — Unmapped (Signed)
Patient cleaned and turned and I/O cathed.

## 2022-11-12 NOTE — Unmapped (Signed)
Bed: HALL-02  Expected date:   Expected time:   Means of arrival:   Comments:  EMS- 57 y/o female, fall.  From [facility] in [place]

## 2022-11-12 NOTE — Unmapped (Signed)
Pt continues to rest, with CPAP in place and ease of respirations is noted with CPCP in place.   Pt  registration engages for completion of reg needs and patient is able to understand and follow directions and complete task. Returns to resting on CPAP    Room assignment pending.

## 2022-11-12 NOTE — Unmapped (Signed)
Pt is on CPAP, sleeping hard, will awake to physical stimuli but falls back asleep. Not able to answer questions. Similar presentations from  last admission personally seen by me.     Treating empirically for UTI, continue rocephin. Follow up cultures.   Supportive care for chronic OSA, obesity hypoventilation syndrome, chronic compensated respiratory acidosis with CPAP nightly and while sleeping. Hold sedating medications for now. Repeat ABG pending.     I have reviewed MAR, updated home med rec and reconciled. I am awaiting call back from SNF to confirm if patient took clozapine last night before reconciling.   Continue eliquis for chronic PE

## 2022-11-12 NOTE — Unmapped (Signed)
Pt resting NAD and ease of respirations OFF CPAP, awake to voice.

## 2022-11-12 NOTE — Unmapped (Signed)
[place] EMERGENCY DEPARTMENT ENCOUNTER      CHIEF COMPLAINT    Chief Complaint   Patient presents with    Fall       HPI    Kimberly Mathis is a 57 y.o. female with past medical history of GERD, hypertension, schizoaffective disorder, DM2, bilateral PE on Eliquis, CML, baseline cognitive impairment, living at long-term care facility presents to the emergency department today by EMS for lethargy and a fall at [facility].  EMS reports that facility did not know last known normal and did not know how long she may have been down on the ground.  Fall was unwitnessed and unknown if she lost consciousness.  Patient with reported history of cognitive impairment with communication deficit and some element of muscle weakness though unclear true baseline.  Patient's speech is incredibly difficult to understand though she did tell me that she fell.  Unable to obtain any significant history from patient.  She was last admitted in March where she was found to have bilateral PEs and a UTI.  She did have a similar presentation to this.  She has been on Eliquis since that time.      PCP: Desiree Lucy, PA      PAST MEDICAL HISTORY    Past Medical History:   Diagnosis Date    GERD (gastroesophageal reflux disease)     Gout     HTN (hypertension)     Morbid obesity (CMS-HCC)     Osteoporosis     Schizoaffective disorder, bipolar type (CMS-HCC)     Type II diabetes mellitus (CMS-HCC)        SURGICAL HISTORY    No past surgical history on file.    CURRENT MEDICATIONS    No current facility-administered medications for this encounter.    Current Outpatient Medications:     acetaminophen (TYLENOL) 325 MG tablet, Take 1 tablet (325 mg total) by mouth every six (6) hours as needed for fever or pain., Disp: , Rfl:     allopurinoL (ZYLOPRIM) 100 MG tablet, Take 1 tablet (100 mg total) by mouth daily., Disp: , Rfl:     apixaban (ELIQUIS) 5 mg Tab, Take 1 tablet (5 mg total) by mouth two (2) times a day., Disp: , Rfl: bisacodyL (DULCOLAX) 5 mg EC tablet, Take 2 tablets (10 mg total) by mouth daily as needed for constipation., Disp: , Rfl:     bosutinib 400 mg Tab, Take 1 tablet (400 mg) by mouth daily. Administer with food. Swallow tablet whole; do not cut, crush, break, or chew., Disp: 30 tablet, Rfl: 5    clozapine (CLOZARIL) 100 MG tablet, Take 1.5 tablets (150 mg total) by mouth daily. 150 mg in the AM and 300 mg at bedtime, Disp: , Rfl:     diclofenac sodium (VOLTAREN) 1 % gel, , Disp: , Rfl:     famotidine (PEPCID) 20 MG tablet, Take 1 tablet (20 mg total) by mouth two (2) times a day., Disp: , Rfl:     hydrALAZINE (APRESOLINE) 10 MG tablet, Take 1 tablet (10 mg total) by mouth Three (3) times a day., Disp: , Rfl:     melatonin 5 mg cap, Take 1 capsule by mouth nightly., Disp: , Rfl:     metFORMIN (GLUCOPHAGE) 1000 MG tablet, Take 0.5 tablets (500 mg total) by mouth daily before breakfast. 1000 mg in the AM and 500 mg at bedtime, Disp: , Rfl:     metoPROLOL tartrate (LOPRESSOR) 25 MG tablet,  Take 1 tablet (25 mg total) by mouth two (2) times a day., Disp: , Rfl:     multivitamin (TAB-A-VITE/THERAGRAN) per tablet, Take 1 capsule by mouth daily., Disp: , Rfl:     ondansetron (ZOFRAN) 4 MG tablet, Take 1 tablet (4 mg total) by mouth every eight (8) hours as needed for nausea., Disp: , Rfl:     pravastatin (PRAVACHOL) 20 MG tablet, Take 1 tablet (20 mg total) by mouth at bedtime., Disp: , Rfl:     QUEtiapine (SEROQUEL) 50 MG tablet, Take 1 tablet (50 mg total) by mouth nightly., Disp: , Rfl:     saliva stimulant comb. no.3 (BIOTENE MOISTURIZING MOUTH) Spry, 1 spray every two (2) hours as needed., Disp: , Rfl:     sertraline (ZOLOFT) 50 MG tablet, Take 1 tablet (50 mg total) by mouth daily., Disp: , Rfl:     valproic acid (DEPAKENE) 250 mg capsule, Take 4 capsules (1,000 mg total) by mouth in the morning and 4 capsules (1,000 mg total) in the evening., Disp: , Rfl:     ALLERGIES    No Known Allergies    FAMILY HISTORY    History reviewed. No pertinent family history.    SOCIAL HISTORY    Social History     Socioeconomic History    Marital status: Single     Spouse name: None    Number of children: None    Years of education: None    Highest education level: None   Tobacco Use    Smoking status: Former     Current packs/day: 1.00     Types: Cigarettes    Smokeless tobacco: Never   Vaping Use    Vaping status: Never Used   Substance and Sexual Activity    Alcohol use: Not Currently    Drug use: Not Currently     Social Determinants of Health     Financial Resource Strain: Low Risk  (04/01/2018)    Overall Financial Resource Strain (CARDIA)     Difficulty of Paying Living Expenses: Not very hard   Food Insecurity: No Food Insecurity (04/01/2018)    Hunger Vital Sign     Worried About Running Out of Food in the Last Year: Never true     Ran Out of Food in the Last Year: Never true   Transportation Needs: No Transportation Needs (04/01/2018)    PRAPARE - Therapist, art (Medical): No     Lack of Transportation (Non-Medical): No   Physical Activity: Unknown (04/01/2018)    Exercise Vital Sign     Days of Exercise per Week: Patient declined     Minutes of Exercise per Session: Patient declined   Social Connections: Unknown (04/01/2018)    Social Connection and Isolation Panel [NHANES]     Frequency of Communication with Friends and Family: Patient declined     Frequency of Social Gatherings with Friends and Family: Patient declined     Attends Religious Services: Patient declined     Database administrator or Organizations: Patient declined     Attends Banker Meetings: Patient declined     Marital Status: Patient declined       PHYSICAL EXAM    VITAL SIGNS:   Vitals:    11/12/22 0215 11/12/22 0230 11/12/22 0245 11/12/22 0330   BP:    125/88   Pulse: 108 108 109 107   Resp: 16 15 21 22    Temp:  TempSrc:       SpO2: 96% 97% 95% 97%   Weight:           General: Chronically ill-appearing, somnolent though arouses to moderate touch.  HENT:  NCAT.  Moist mucous membranes.    Eyes:  Normal appearance, no icterus noted.  EOMI.  PERRLA.  Neck:  Supple, normal ROM.  No nuchal rigidity.  CV: Tachycardic rate. Regular rhythm.  Peripheral pulses equal and intact.   Resp:  Normal work of breathing, no wheezes noted.  Abdomen: Soft, non-tender, non-distended.  No organomegaly.    GU:  No suprapubic tenderness or CVA tenderness.    Extremities:  Warm and well-perfused.  No gross deformity.  No peripheral edema.   Skin:  Warm, dry, no rash.  Lymphatic:  No lymphadenopathy.   Neuro: Somnolent though arousable to moderate touch.  Extremely difficult to understand speech though will follow some commands.  Patient with generalized weakness in all extremities though weakness largely appears symmetric.  Psych:  Normal affect and speech pattern.      RELEVANT LAB DATA  Interpretation of remarkable lab data:      Labs Reviewed   COMPREHENSIVE METABOLIC PANEL - Abnormal; Notable for the following components:       Result Value    CO2 34.1 (*)     BUN 4 (*)     Albumin 3.3 (*)     Total Protein 9.8 (*)     Total Bilirubin 0.2 (*)     All other components within normal limits   URINALYSIS WITH MICROSCOPY - Abnormal; Notable for the following components:    Leukocyte Esterase, UA 1+ (*)     Protein, UA 1+ (*)     Ketones, UA 1+ (*)     Urobilinogen, UA 1+ (*)     RBC, UA 4 (*)     WBC, UA 17 (*)     Bacteria, UA Rare (*)     All other components within normal limits   RESP CARE ONLY BLOOD GAS VENOUS - Abnormal; Notable for the following components:    pCO2, Ven 60.7 (*)     Base Excess, Ven 9.4 (*)     HCO3, Ven 31.3 (*)     All other components within normal limits   HEMOGLOBIN BG/POC - Abnormal; Notable for the following components:    Hemoglobin 10.5 (*)     All other components within normal limits   CBC W/ AUTO DIFF - Abnormal; Notable for the following components:    WBC 15.5 (*)     RBC 3.80 (*)     HGB 11.1 (*)     HCT 33.6 (*) Absolute Neutrophils 9.7 (*)     Absolute Monocytes 2.1 (*)     All other components within normal limits   INFLUENZA/RSV/COVID PCR - Normal    Narrative:     This test was performed using the Cepheid Xpert Xpress SARS-CoV-2/Flu/RSV plus assay, which has been validated by the CLIA-certified, CAP-inspected [facility]                  Negative results do not preclude SARS-CoV-2, influenza A virus, influenza B virus and/or RSV infection and should not be used as the sole basis for treatment or other patient management decisions. Negative results must be combined with clinical observations, patient history, and/or epidemiological information.   ETHANOL - Normal   MAGNESIUM - Normal   HIGH SENSITIVITY TROPONIN I -  SINGLE - Normal   TSH - Normal   CK - Normal   POCT LACTATE VENOUS - Normal   URINE CULTURE   CBC W/ DIFFERENTIAL    Narrative:     The following orders were created for panel order CBC w/ Differential.                  Procedure                               Abnormality         Status                                     ---------                               -----------         ------                                     CBC w/ [id number]]         Abnormal            Final result                                                 Please view results for these tests on the individual orders.   POCT LACTATE VENOUS - RN OBTAIN   POCT VENOUS BLOOD GAS - RN OBTAIN         EKG Interpretation    Sinus tachycardia with a rate of 106 bpm. QTc duration 454 ms. Nonspecific ST and T wave abnormality. No regional ischemic changes. No conduction delays. No significant change when compared with prior EKG.      RADIOLOGY/PROCEDURES    XR Chest Portable    Result Date: 11/12/2022  Exam:  Portable Chest History:  Altered mental status. Technique:  Single frontal view. Comparison:  CTA of the chest dated 10/21/2022 Findings:   Low lung volumes are present, which accentuate the cardiac silhouette and pulmonary vasculature, and causes crowding of the bronchovascular markings at the bases. Enlarged cardiac silhouette. No focal consolidation, pulmonary edema, pleural effusion, or pneumothorax.     Low lung volumes. No focal consolidation. Signed (Electronic Signature): 11/12/2022 2:12 AM Signed By: Bettye Boeck, MD    CTA Head And Neck W Contrast    Result Date: 11/12/2022  ========================================= THIS IS A PRELIMINARY REPORT ONLY.  PLEASE REFER TO THE FORMAL, FINALIZED REPORT FOR THIS EXAM THAT WILL FOLLOW. Exam:  CTA of the head and neck History:  Altered mental status Comparison:  CT brain dated 11/12/2022     PRELIMINARY:  No large vessel occlusion in the head or neck. THIS IS A PRELIMINARY REPORT ONLY.  PLEASE REFER TO THE FORMAL, FINALIZED REPORT FOR THIS EXAM THAT WILL FOLLOW. =========================================    CT head without contrast    Result Date: 11/12/2022  Exam:  CT Head without Contrast History:  Fall, altered mental status Technique: Routine brain CT without IV contrast. AEC (automated exposure control) and/or manual techniques such as size-specific  kV and mAs are employed where appropriate to reduce radiation exposure for all CT exams. Comparison:  None. Findings:  BRAIN:  No CT evidence of acute infarction, hemorrhage, edema, mass or mass effect. Ventricles and basilar cisterns are unremarkable. SOFT TISSUES:  Negative. CALVARIUM:  Negative. No fracture. SINUSES AND MASTOIDS:  No mucosal thickening or fluid.     Negative head CT without contrast. Signed (Electronic Signature): 11/12/2022 2:01 AM Signed By: Bettye Boeck, MD     ED COURSE & MEDICAL DECISION MAKING    I have reviewed all pertinent labs, imaging studies, and electronic medical records available on the patient.       57 y.o. female with past medical history of GERD, hypertension, schizoaffective disorder, DM2, bilateral PE on Eliquis, CML, baseline cognitive impairment, living at long-term care facility presents to the emergency department today by EMS for lethargy and a fall at [facility].     Vitals with hypertension, tachycardia.  Patient was reportedly satting 89% on arrival on room air and was placed on 2 L nasal cannula.  Per prior records, she is occasionally on 2 L nasal cannula as needed.  On exam, patient chronically ill-appearing, somnolent though arouses to moderate touch. Somnolent though arousable to moderate touch.  Extremely difficult to understand speech though will follow some commands.  Patient with generalized weakness in all extremities though weakness largely appears symmetric.    Differential diagnosis includes fall without intracranial hemorrhage versus metabolic encephalopathy secondary to electrolyte abnormality versus infection versus slight deviation from chronic poor baseline versus ischemic stroke versus rhabdomyolysis given unknown length on the ground.    Will obtain labs, urine, EKG, chest x-ray, CT head.    3:46 AM  CBC with leukocytosis to 15.5, hemoglobin 11.1, platelets normal.  Lactate normal.  CMP with CO2 34.1, otherwise unremarkable.  Ethanol negative.  High-sensitivity troponin normal at 5.  TSH normal.  CK normal.  Urinalysis with 1+ leukocyte esterase, 17 WBCs, rare bacteria (this was clean catheter urine).  Urine culture sent.  COVID-19/flu negative.    Independently reviewed and interpreted chest x-ray which does not show clear lobar pneumonia.  Formal radiology read shows low lung volumes.  No focal consolidation.    CT head negative.  CTA head and neck with no large vessel occlusion.    Will treat for UTI.  Patient's mental status has improved and she now has intelligible speech.  She does say that she has felt extremely tired and is so tired that she cannot even eat.  She does not appear to be at baseline at this time.  Discussed case with hospitalist service who will evaluate in the ED.    The patient received the following medications during ED stay:  Medications   iohexol (OMNIPAQUE) 350 mg iodine/mL solution 75 mL (75 mL Intravenous Given 11/12/22 0203)   cefTRIAXone (ROCEPHIN) FOR IV PUSH 1 g (1 g Intravenous Given 11/12/22 0410)         Dortha Wikel was evaluated in the Emergency Department on November 11, 2022 11:32 PM for the symptoms described in the history of present illness. Patient was evaluated in the context of the global COVID-19 pandemic, which necessitated consideration that the patient might be at risk for infection with the SARS-CoV-2 virus that causes COVID-19. Institutional protocols and algorithms that pertain to the evaluation of patients at risk for COVID-19 are in a state of rapid change based on information released by regulatory bodies including the CDC and federal and state organizations.  These policies and algorithms were followed during the patient's care in the ED.     Disposition: Admit    FINAL IMPRESSION    1.  Altered mental status  2.  Fall  3.  UTI        Karilyn Cota, MD  11/12/22 [phone]

## 2022-11-12 NOTE — Unmapped (Signed)
Report rcvd , pt resting NAD monitors in place and no alarms are sounding. Purwick in place. Orders pending and pt awaiting room upstairs.

## 2022-11-12 NOTE — Unmapped (Signed)
Pt awakens to voice is able to follow commands and takes PO meds without difficulty, FSBS is completed and sliding scale dosing is completed.   Pt remains drowsy and sleeps off and on CPAP was ordered overnight and RT requested to come and set-up CPAP.

## 2022-11-12 NOTE — Unmapped (Signed)
RT NPPV Rounds    Date: 11/12/2022    Time: 1100    Home device in use: No    Device: Resmed    Device in  use: Yes    Contraindicated at this time: Other; Please specify    Mode: CPAP Autoset    Settings: 20/4    FIO2/ LPM: 2L    Mask Type: Full face  Leak: Leak 2- Vented    Alarms checked and documented on flowsheet: Yes    Pt tolerating: Yes    Pt Refusal: No    D/C Device: No    If so, D/C Reason: Other; Please specify      Comments:        11/12/22 1115   NPPV   $$ NPPV  BIPAP/ CPAP   NPPV Status On   Adverse Reactions None   Interface Full face mask   Interface Size Medium   Skin Integrity good   Mepilex applied? no   NPPV Type CPAP   Device model Resmed Lumis   FiO2 (%)   (2L bleed in)   Rise Time Setting   (autoset 20/4)   NPPV Interventions   NPPV/CPAP Maintenance adjusted;heated humidification used;full face mask   NPPV Readings   Total Rate 18 breaths/min   Vt (Exhaled) 321 mL   Minute Volume 5 L/min   Patient Leak 16 L/min   Alarm Check On   Humidifier Checked Yes

## 2022-11-13 LAB — BASIC METABOLIC PANEL
ANION GAP: 10 mmol/L (ref 3–11)
BLOOD UREA NITROGEN: 6 mg/dL — ABNORMAL LOW (ref 9–23)
BUN / CREAT RATIO: 11
CALCIUM: 9.2 mg/dL (ref 8.7–10.4)
CHLORIDE: 100 mmol/L (ref 98–107)
CO2: 31.3 mmol/L — ABNORMAL HIGH (ref 20.0–31.0)
CREATININE: 0.54 mg/dL (ref 0.50–0.80)
EGFR CKD-EPI (2021) FEMALE: >90 mL/min/{1.73_m2} (ref >=60)
GLUCOSE RANDOM: 149 mg/dL (ref 70–179)
POTASSIUM: 3.4 mmol/L — ABNORMAL LOW (ref 3.5–5.1)
SODIUM: 141 mmol/L (ref 136–145)

## 2022-11-13 LAB — CBC
HEMATOCRIT: 31.3 % — ABNORMAL LOW (ref 34.0–44.0)
HEMOGLOBIN: 10.2 g/dL — ABNORMAL LOW (ref 11.3–14.9)
MEAN CORPUSCULAR HEMOGLOBIN CONC: 32.6 g/dL (ref 32.0–36.0)
MEAN CORPUSCULAR HEMOGLOBIN: 28.6 pg (ref 25.9–32.4)
MEAN CORPUSCULAR VOLUME: 87.7 fL (ref 77.6–95.7)
MEAN PLATELET VOLUME: 7.5 fL (ref 6.8–10.7)
PLATELET COUNT: 440 10*9/L (ref 150–450)
RED BLOOD CELL COUNT: 3.57 10*12/L — ABNORMAL LOW (ref 3.95–5.13)
RED CELL DISTRIBUTION WIDTH: 14.2 % (ref 12.2–15.2)
WBC ADJUSTED: 15.5 10*9/L — ABNORMAL HIGH (ref 3.6–11.2)

## 2022-11-13 LAB — VALPROIC ACID LEVEL, TOTAL: VALPROIC ACID TOTAL: 74.2 ug/mL (ref 50.0–100.0)

## 2022-11-13 LAB — MAGNESIUM: MAGNESIUM: 2.1 mg/dL (ref 1.6–2.6)

## 2022-11-13 MED ADMIN — insulin lispro (HumaLOG) injection 0-20 Units: 0-20 [IU] | SUBCUTANEOUS | @ 13:00:00

## 2022-11-13 MED ADMIN — potassium chloride (KLOR-CON) packet 40 mEq: 40 meq | ORAL | @ 21:00:00 | Stop: 2022-11-13

## 2022-11-13 MED ADMIN — valproate (DEPAKENE) oral syrup: 1000 mg | ORAL | @ 02:00:00

## 2022-11-13 MED ADMIN — apixaban (ELIQUIS) tablet 5 mg: 5 mg | ORAL | @ 01:00:00

## 2022-11-13 MED ADMIN — sertraline (ZOLOFT) tablet 50 mg: 50 mg | ORAL | @ 16:00:00

## 2022-11-13 MED ADMIN — cefTRIAXone (ROCEPHIN) 1 g in sodium chloride 0.9 % (NS) 100 mL IVPB-connector bag: 1 g | INTRAVENOUS | @ 09:00:00 | Stop: 2022-11-15

## 2022-11-13 MED ADMIN — metoPROLOL succinate (Toprol-XL) 24 hr tablet 25 mg: 25 mg | ORAL | @ 16:00:00

## 2022-11-13 MED ADMIN — famotidine (PEPCID) tablet 20 mg: 20 mg | ORAL | @ 16:00:00

## 2022-11-13 MED ADMIN — insulin lispro (HumaLOG) injection 0-20 Units: 0-20 [IU] | SUBCUTANEOUS | @ 02:00:00

## 2022-11-13 MED ADMIN — bosutinib Tab 400 mg: 400 mg | ORAL | @ 16:00:00

## 2022-11-13 MED ADMIN — cloZAPine (CLOZARIL) tablet 300 mg: 300 mg | ORAL | @ 01:00:00

## 2022-11-13 MED ADMIN — insulin lispro (HumaLOG) injection 0-20 Units: 0-20 [IU] | SUBCUTANEOUS | @ 17:00:00

## 2022-11-13 MED ADMIN — QUEtiapine (SEROQUEL) tablet 50 mg: 50 mg | ORAL | @ 01:00:00

## 2022-11-13 MED ADMIN — pravastatin (PRAVACHOL) tablet 20 mg: 20 mg | ORAL | @ 01:00:00

## 2022-11-13 MED ADMIN — allopurinol (ZYLOPRIM) tablet 100 mg: 100 mg | ORAL | @ 16:00:00

## 2022-11-13 MED ADMIN — apixaban (ELIQUIS) tablet 5 mg: 5 mg | ORAL | @ 16:00:00

## 2022-11-13 MED ADMIN — famotidine (PEPCID) tablet 20 mg: 20 mg | ORAL | @ 01:00:00

## 2022-11-13 MED ADMIN — metoPROLOL succinate (Toprol-XL) 24 hr tablet 25 mg: 25 mg | ORAL | @ 01:00:00

## 2022-11-13 MED ADMIN — insulin lispro (HumaLOG) injection 0-20 Units: 0-20 [IU] | SUBCUTANEOUS | @ 22:00:00

## 2022-11-13 NOTE — Unmapped (Deleted)
Problem: Skin Injury Risk Increased  Goal: Skin Health and Integrity  Outcome: Progressing     Problem: Fall Injury Risk  Goal: Absence of Fall and Fall-Related Injury  Outcome: Progressing

## 2022-11-13 NOTE — Unmapped (Signed)
Patient A&Ox2. Confused. VSS on 2L/[place]. ACHS. CPAP at night. IV Abx.                       POC and fall prevention reviewed with patient. Bed alarm on. Call bell within reach. Will continue to monitor. Sepsis screen negative. AMP score of  4    Problem: Skin Injury Risk Increased  Goal: Skin Health and Integrity  Outcome: Progressing  Intervention: Optimize Skin Protection  Recent Flowsheet Documentation  Taken 11/13/2022 0300 by Bastos de Britis Saunders Revel, RN  Activity Management: dorsiflexion/plantar flexion performed  Pressure Reduction Techniques:   frequent weight shift encouraged   weight shift assistance provided  Head of Bed (HOB) Positioning: HOB at 20-30 degrees  Pressure Reduction Devices: pressure-redistributing mattress utilized  Skin Protection:   adhesive use limited   incontinence pads utilized  Taken 11/13/2022 0100 by Bastos de Britis Saunders Revel, RN  Activity Management: dorsiflexion/plantar flexion performed  Pressure Reduction Techniques:   frequent weight shift encouraged   weight shift assistance provided  Head of Bed (HOB) Positioning: HOB at 20-30 degrees  Pressure Reduction Devices: pressure-redistributing mattress utilized  Skin Protection:   adhesive use limited   incontinence pads utilized  Taken 11/12/2022 2300 by Bastos de Britis Saunders Revel, RN  Activity Management: dorsiflexion/plantar flexion performed  Pressure Reduction Techniques:   frequent weight shift encouraged   weight shift assistance provided  Head of Bed (HOB) Positioning: HOB at 20-30 degrees  Pressure Reduction Devices: pressure-redistributing mattress utilized  Skin Protection:   adhesive use limited   incontinence pads utilized  Taken 11/12/2022 2100 by Bastos de Britis Saunders Revel, RN  Activity Management: dorsiflexion/plantar flexion performed  Pressure Reduction Techniques:   frequent weight shift encouraged   weight shift assistance provided  Head of Bed (HOB) Positioning: HOB at 20-30 degrees  Pressure Reduction Devices: pressure-redistributing mattress utilized  Skin Protection:   adhesive use limited   incontinence pads utilized  Taken 11/12/2022 1910 by [employer] de Britis Saunders Revel, RN  Activity Management: dorsiflexion/plantar flexion performed  Pressure Reduction Techniques:   frequent weight shift encouraged   weight shift assistance provided  Head of Bed (HOB) Positioning: HOB at 20-30 degrees  Pressure Reduction Devices: pressure-redistributing mattress utilized  Skin Protection:   adhesive use limited   incontinence pads utilized  Taken 11/12/2022 1700 by Bastos de Britis Saunders Revel, RN  Activity Management: dorsiflexion/plantar flexion performed  Pressure Reduction Techniques:   frequent weight shift encouraged   weight shift assistance provided  Head of Bed (HOB) Positioning: HOB at 20-30 degrees  Pressure Reduction Devices: pressure-redistributing mattress utilized  Skin Protection:   adhesive use limited   incontinence pads utilized     Problem: Fall Injury Risk  Goal: Absence of Fall and Fall-Related Injury  Outcome: Progressing  Intervention: Promote Scientist, clinical (histocompatibility and immunogenetics) Documentation  Taken 11/13/2022 0300 by [employer] de Britis Saunders Revel, RN  Safety Interventions:   bed alarm   fall reduction program maintained   lighting adjusted for tasks/safety   low bed   nonskid shoes/slippers when out of bed  Taken 11/13/2022 0100 by Bastos de Britis Saunders Revel, RN  Safety Interventions:   bed alarm   fall reduction program maintained   lighting adjusted for tasks/safety   low bed   nonskid shoes/slippers when out of bed  Taken 11/12/2022 2300 by Bastos de Britis Saunders Revel, RN  Safety Interventions:   bed alarm   fall reduction program maintained  lighting adjusted for tasks/safety   low bed   nonskid shoes/slippers when out of bed  Taken 11/12/2022 2100 by Bastos de Britis Saunders Revel, RN  Safety Interventions:   bed alarm   fall reduction program maintained   lighting adjusted for tasks/safety   low bed   nonskid shoes/slippers when out of bed  Taken 11/12/2022 1910 by Bastos de Britis Saunders Revel, RN  Safety Interventions:   bed alarm   fall reduction program maintained   lighting adjusted for tasks/safety   low bed   nonskid shoes/slippers when out of bed  Taken 11/12/2022 1700 by Bastos de Britis Saunders Revel, RN  Safety Interventions:   bed alarm   fall reduction program maintained   lighting adjusted for tasks/safety   low bed   nonskid shoes/slippers when out of bed     Problem: Diabetes  Goal: Optimal Coping  Outcome: Progressing  Goal: Optimal Functional Ability  Outcome: Progressing  Intervention: Optimize Functional Ability  Recent Flowsheet Documentation  Taken 11/13/2022 0300 by Bastos de Britis Saunders Revel, RN  Activity Management: dorsiflexion/plantar flexion performed  Taken 11/13/2022 0100 by Bastos de Britis Saunders Revel, RN  Activity Management: dorsiflexion/plantar flexion performed  Taken 11/12/2022 2300 by Bastos de Britis Saunders Revel, RN  Activity Management: dorsiflexion/plantar flexion performed  Taken 11/12/2022 2100 by Bastos de Britis Saunders Revel, RN  Activity Management: dorsiflexion/plantar flexion performed  Taken 11/12/2022 1910 by Bastos de Britis Saunders Revel, RN  Activity Management: dorsiflexion/plantar flexion performed  Taken 11/12/2022 1700 by Bastos de Britis Saunders Revel, RN  Activity Management: dorsiflexion/plantar flexion performed  Goal: Blood Glucose Level Within Target Range  Outcome: Progressing  Goal: Minimize Risk of Hypoglycemia  Outcome: Progressing     Problem: Comorbidity Management  Goal: Maintenance of Asthma Control  Outcome: Progressing  Goal: Maintenance of Behavioral Health Symptom Control  Outcome: Progressing  Goal: Maintenance of COPD Symptom Control  Outcome: Progressing  Goal: Blood Glucose Levels Within Targeted Range  Outcome: Progressing  Goal: Maintenance of Heart Failure Symptom Control  Outcome: Progressing  Goal: Blood Pressure in Desired Range  Outcome: Progressing  Goal: Maintenance of Osteoarthritis Symptom Control  Outcome: Progressing  Intervention: Maintain Osteoarthritis Symptom Control  Recent Flowsheet Documentation  Taken 11/13/2022 0300 by Bastos de Britis Saunders Revel, RN  Activity Management: dorsiflexion/plantar flexion performed  Taken 11/13/2022 0100 by Bastos de Britis Saunders Revel, RN  Activity Management: dorsiflexion/plantar flexion performed  Taken 11/12/2022 2300 by Bastos de Britis Saunders Revel, RN  Activity Management: dorsiflexion/plantar flexion performed  Taken 11/12/2022 2100 by Bastos de Britis Saunders Revel, RN  Activity Management: dorsiflexion/plantar flexion performed  Taken 11/12/2022 1910 by Rica Records de Britis Saunders Revel, RN  Activity Management: dorsiflexion/plantar flexion performed  Taken 11/12/2022 1700 by Bastos de Britis Saunders Revel, RN  Activity Management: dorsiflexion/plantar flexion performed  Goal: Bariatric Home Regimen Maintained  Outcome: Progressing  Goal: Maintenance of Seizure Control  Outcome: Progressing     Problem: Adult Inpatient Plan of Care  Goal: Plan of Care Review  Outcome: Progressing  Flowsheets (Taken 11/13/2022 0410)  Progress: improving  Plan of Care Reviewed With: patient  Goal: Patient-Specific Goal (Individualized)  Outcome: Progressing  Goal: Absence of Hospital-Acquired Illness or Injury  Outcome: Progressing  Intervention: Identify and Manage Fall Risk  Recent Flowsheet Documentation  Taken 11/13/2022 0300 by Bastos de Britis Saunders Revel, RN  Safety Interventions:   bed alarm   fall reduction program maintained   lighting adjusted for tasks/safety   low  bed   nonskid shoes/slippers when out of bed  Taken 11/13/2022 0100 by Bastos de Britis Saunders Revel, RN  Safety Interventions:   bed alarm   fall reduction program maintained lighting adjusted for tasks/safety   low bed   nonskid shoes/slippers when out of bed  Taken 11/12/2022 2300 by Bastos de Britis Saunders Revel, RN  Safety Interventions:   bed alarm   fall reduction program maintained   lighting adjusted for tasks/safety   low bed   nonskid shoes/slippers when out of bed  Taken 11/12/2022 2100 by Bastos de Britis Saunders Revel, RN  Safety Interventions:   bed alarm   fall reduction program maintained   lighting adjusted for tasks/safety   low bed   nonskid shoes/slippers when out of bed  Taken 11/12/2022 1910 by Bastos de Britis Saunders Revel, RN  Safety Interventions:   bed alarm   fall reduction program maintained   lighting adjusted for tasks/safety   low bed   nonskid shoes/slippers when out of bed  Taken 11/12/2022 1700 by Bastos de Britis Saunders Revel, RN  Safety Interventions:   bed alarm   fall reduction program maintained   lighting adjusted for tasks/safety   low bed   nonskid shoes/slippers when out of bed  Intervention: Prevent Skin Injury  Recent Flowsheet Documentation  Taken 11/13/2022 0300 by Bastos de Britis Saunders Revel, RN  Positioning for Skin:   Right   Supine/Back  Skin Protection:   adhesive use limited   incontinence pads utilized  Taken 11/13/2022 0100 by Bastos de Britis Saunders Revel, RN  Positioning for Skin: Left  Skin Protection:   adhesive use limited   incontinence pads utilized  Taken 11/12/2022 2300 by Bastos de Britis Saunders Revel, RN  Positioning for Skin:   Right   Supine/Back  Skin Protection:   adhesive use limited   incontinence pads utilized  Taken 11/12/2022 2100 by Bastos de Britis Saunders Revel, RN  Positioning for Skin:   Left   Supine/Back  Skin Protection:   adhesive use limited   incontinence pads utilized  Taken 11/12/2022 1910 by Bastos de Britis Saunders Revel, RN  Positioning for Skin:   Supine/Back   Left  Skin Protection:   adhesive use limited   incontinence pads utilized  Taken 11/12/2022 1700 by Bastos de Britis Saunders Revel, RN  Positioning for Skin: Right  Skin Protection:   adhesive use limited   incontinence pads utilized  Intervention: Prevent and Manage VTE (Venous Thromboembolism) Risk  Recent Flowsheet Documentation  Taken 11/12/2022 2045 by Rica Records de Britis Saunders Revel, RN  VTE Prevention/Management: (Eliquis) anticoagulant therapy  Goal: Optimal Comfort and Wellbeing  Outcome: Progressing  Goal: Readiness for Transition of Care  Outcome: Progressing  Goal: Rounds/Family Conference  Outcome: Progressing     Problem: Self-Care Deficit  Goal: Improved Ability to Complete Activities of Daily Living  Outcome: Progressing     Problem: UTI (Urinary Tract Infection)  Goal: Improved Infection Symptoms  Outcome: Progressing

## 2022-11-13 NOTE — Unmapped (Signed)
Problem: Skin Injury Risk Increased  Goal: Skin Health and Integrity  11/12/2022 1629 by Belinda Block, RN  Outcome: Progressing  11/12/2022 1605 by Belinda Block, RN  Outcome: Progressing     Problem: Fall Injury Risk  Goal: Absence of Fall and Fall-Related Injury  11/12/2022 1629 by Belinda Block, RN  Outcome: Progressing  11/12/2022 1605 by Belinda Block, RN  Outcome: Progressing     Problem: Diabetes  Goal: Optimal Coping  Outcome: Progressing  Goal: Optimal Functional Ability  Outcome: Progressing  Goal: Blood Glucose Level Within Target Range  Outcome: Progressing  Goal: Minimize Risk of Hypoglycemia  Outcome: Progressing     Problem: Comorbidity Management  Goal: Maintenance of Asthma Control  Outcome: Progressing  Goal: Maintenance of Behavioral Health Symptom Control  Outcome: Progressing  Goal: Maintenance of COPD Symptom Control  Outcome: Progressing  Goal: Blood Glucose Levels Within Targeted Range  Outcome: Progressing  Goal: Maintenance of Heart Failure Symptom Control  Outcome: Progressing  Goal: Blood Pressure in Desired Range  Outcome: Progressing  Goal: Maintenance of Osteoarthritis Symptom Control  Outcome: Progressing  Goal: Bariatric Home Regimen Maintained  Outcome: Progressing  Goal: Maintenance of Seizure Control  Outcome: Progressing

## 2022-11-13 NOTE — Unmapped (Signed)
[facility] [place]:  Daily Progress Note      Admission Date:  11/11/2022       Assessment & Plan     Principal Problem:    Acute metabolic encephalopathy  Active Problems:    Type II diabetes mellitus (CMS-HCC)    HTN (hypertension)    Schizoaffective disorder, bipolar type (CMS-HCC)    CML (chronic myelocytic leukemia) (CMS-HCC)    Chronic pulmonary embolism without acute cor pulmonale (CMS-HCC)    Complicated UTI (urinary tract infection)    Obesity, Class III, BMI 40-49.9 (morbid obesity) (CMS-HCC)     Acute metabolic Encephalopathy  -May be due to UTI as similar presentation in March  -However, afebrile without signs of sepsis  -Continue UTI tx  -ABGs show chronic compensated respiratory acidosis  -She is on chronic antipsychotis for schizoaffective disorder. It appears clozapine dose has been reduced to 300 mg nightly only since the last time we saw her. Remains on depakote, seroquel.   -Avoid additional sedatives.   -Hold depakote this AM, check level  -I have seen this patient before and discussed again with SNF and family. At baseline she is usually somnolent, sleeps most of the day in her chair at SNF, does not use CPAP or O2 regularly. She is usually a poor historian and often complains of chronic pain. Able to make basic needs known. When she is well, she will ask to eat. She has yet to eat since admission indicating still departure from baseline.   -Consider psych consult regarding meds if does not clear.     UTI  -Continue rocephin  -Urine cx gowing proteus again     Chronic Hypercapnic Respiratory Failure  Hypoxia  -No respiratory distress or complaints, says she uses O2 sometimes  -Has had witnessed apnea on previous admission while sleeping. Morbidly obese. pCO2 elevated similar to prior labs with metabolic compensation, normal pH on ABG.   -Suspect nderlying sleep apnea, obesity hypoventilation syndrome. It does not appear that she got sleep study as recommended last admission.  -Is compliant with CPAP while sleeping here  -O2 per protocol, wean as tolerated      Chronic PE  -Continue Eliquis.  -Per hematology note, continue eliquis indefinitely.      Schizoaffective Disorder  -As above, meds may be contributing to lethargy  -continue clozapine 300 nightly (confirmed no missed doses), seroquel 50 nightly. Hold depakote. Check level  -Consider psych consult if depakote level elevated or if pt does not respond to treating UTI    CML  -Hem/onc notes reviewed, recently switched to bosutinib, will continue this.      HTN  -controlled     DM2  -SSI, A1c, hold home metformin     Morbid Obesity  -Body mass index is 43.5 kg/m??. Contirnubtes to morbidity, mortality, complicated care.    Consults:  none    CODE Status:   Full Code  DVT prophylaxis:  on other anticoagulant (apixaban, rivaroxaban, dabigatran, prasugrel)  Anticipated disposition: To Skilled Nursing Facility  Estimated discharge:  1-2 days    Progress Note Admin    Progress Note Admin    Inpatient: At this time, my patient has already crossed one medically necessary midnight of care.  Given need for ongoing care that, in my opinion, can only be delivered safely in the hospital, he/she will require additional hospital care which will cross a second medically necessary midnight prior to a safe discharge.     Billing Complexity: 11/13/2022   Patient  is at  high risk of morbidity and has high complexity due to persistent metabolic encephalopathy due to UTI vs sedating medications in setting of untreated sleep apnea/OHS, requires continued O2, CPAP while sleeping and close monitoring of resp status still.   I discussed the case with:None      Treatment: Medication monitoring with an acute illness/potential drug toxicity related to antispychoatic    Labs/Imaging:  lab specific test/value: CBC, BMP       Subjective     Pt is sleeping with CPAP on. She will wake to verbal cues this AM (yesterday required physical and painful stimuli to wake). She is poor historian at baseline. She shakes her head no when asked if she is ok. When asked what is bothering her she is not able to say. I asked if she was in pain, she reported pain all over, in my back. Denies feeling short of breath, coughing, or chest pain. Falls back to sleep easily.  Per RN, did come off CPAP and wake up to take meds. Swallowed ok.     Review of Systems   Unable to perform ROS: Acuity of condition       Objective     General:  No distress.     Neuro:   Somnolent. Cooperative. Dysarthric speech.   HEENT:  No scleral icterus. Moist mucus membranes. Drooling (baseline)  CV:   Tachycardic, regular. No murmurs.   Pulmonary:  Lungs are clear anteriorly over CPAP.   Abdomen:  Soft. Obese. Nontender. Nondistended. Normal bowel sounds.  MSK:    No LE edema.   GU:   External urinary catheter present.   SKIN:   No rashes.      Objective:      Vital signs in last 24 hours:  Temp:  [36.7 ??C (98.1 ??F)-37.3 ??C (99.1 ??F)] 37.3 ??C (99.1 ??F)  Heart Rate:  [105-118] 112  SpO2 Pulse:  [105-111] 111  Resp:  [14-22] 17  BP: (106-153)/(70-103) 130/72  MAP (mmHg):  [82-113] 87  SpO2:  [85 %-97 %] 92 %  Oxygen Therapy        Date/Time Resp SpO2 O2 Device O2 Flow Rate (L/min)    11/13/22 0731 17  92 %  --  --                      Weight:  Weight change: 1.7 kg (3 lb 12 oz)  Vitals:    11/11/22 2312 11/13/22 0615   Weight: (!) 126 kg (277 lb 12.5 oz) (!) 127.7 kg (281 lb 8.4 oz)       Intake/Output last 3 shifts:  I/O last 3 completed shifts:  In: -   Out: 500 [Urine:500]    Lines and Drains:  Patient Lines/Drains/Airways Status       Active Active Lines, Drains, & Airways       Name Placement date Placement time Site Days    External Urinary Device 03/29/22 With Suction 03/29/22  1605  -- 228    Peripheral IV 11/12/22 Left Antecubital 11/12/22  0106  Antecubital  1                    Current Inpatient Medications       Current Facility-Administered Medications:     acetaminophen (TYLENOL) tablet 650 mg, 650 mg, Oral, Q4H PRN, Bhagwandass, Nithin, MD, 650 mg at 11/12/22 0904    allopurinol (ZYLOPRIM) tablet 100 mg, 100 mg, Oral, Daily, Marlyce Huge,  PA, 100 mg at 11/13/22 1030    apixaban (ELIQUIS) tablet 5 mg, 5 mg, Oral, BID, Bhagwandass, Nithin, MD, 5 mg at 11/13/22 1030    bisacodyl (DULCOLAX) EC tablet 10 mg, 10 mg, Oral, Daily PRN, Marlyce Huge, PA    bosutinib Tab 400 mg, 400 mg, Oral, Daily, Bhagwandass, Nithin, MD, 400 mg at 11/13/22 1030    cefTRIAXone (ROCEPHIN) 1 g in sodium chloride 0.9 % (NS) 100 mL IVPB-connector bag, 1 g, Intravenous, Q24H, Marlyce Huge, [place], Stopped at 11/13/22 0434    cloZAPine (CLOZARIL) tablet 300 mg, 300 mg, Oral, Nightly, Marlyce Huge, [place], 300 mg at 11/12/22 2022    dextrose (D10W) 10% bolus 125 mL, 12.5 g, Intravenous, Q10 Min PRN, Bhagwandass, Nithin, MD    famotidine (PEPCID) tablet 20 mg, 20 mg, Oral, BID, Marlyce Huge, PA, 20 mg at 11/13/22 1030    flu vaccine TS 2024-25(57mos up)(PF)(FLULAVAL, FLUARIX, FLUZONE), 0.5 mL, Intramuscular, During hospitalization, Marlyce Huge, PA    glucagon injection 1 mg, 1 mg, Intramuscular, Once PRN, Bhagwandass, Nithin, MD    glucose chewable tablet 16 g, 16 g, Oral, Q10 Min PRN, Bhagwandass, Nithin, MD    insulin lispro (HumaLOG) injection 0-20 Units, 0-20 Units, Subcutaneous, ACHS, Bhagwandass, Nithin, MD, 2 Units at 11/13/22 0825    metoPROLOL succinate (Toprol-XL) 24 hr tablet 25 mg, 25 mg, Oral, BID, Marlyce Huge, PA, 25 mg at 11/13/22 1030    ondansetron (ZOFRAN-ODT) disintegrating tablet 4 mg, 4 mg, Oral, Q6H PRN **OR** ondansetron (ZOFRAN) injection 4 mg, 4 mg, Intravenous, Q8H PRN, Bhagwandass, Nithin, MD    pravastatin (PRAVACHOL) tablet 20 mg, 20 mg, Oral, At bedtime, Marlyce Huge, PA, 20 mg at 11/12/22 2021    promethazine (PHENERGAN) injection 6.25 mg, 6.25 mg, Intramuscular, Q6H PRN, Bhagwandass, Nithin, MD QUEtiapine (SEROQUEL) tablet 50 mg, 50 mg, Oral, Nightly, Marlyce Huge, [place], 50 mg at 11/12/22 2022    sertraline (ZOLOFT) tablet 50 mg, 50 mg, Oral, Daily, Marlyce Huge, [place], 50 mg at 11/13/22 1030    valproate (DEPAKENE) oral syrup, 1,000 mg, Oral, Q12H SCH, Marlyce Huge, PA, 1,000 mg at 11/12/22 2038

## 2022-11-14 LAB — CBC
HEMATOCRIT: 36.8 % (ref 34.0–44.0)
HEMOGLOBIN: 11.8 g/dL (ref 11.3–14.9)
MEAN CORPUSCULAR HEMOGLOBIN CONC: 32 g/dL (ref 32.0–36.0)
MEAN CORPUSCULAR HEMOGLOBIN: 28.4 pg (ref 25.9–32.4)
MEAN CORPUSCULAR VOLUME: 88.6 fL (ref 77.6–95.7)
MEAN PLATELET VOLUME: 7.5 fL (ref 6.8–10.7)
PLATELET COUNT: 390 10*9/L (ref 150–450)
RED BLOOD CELL COUNT: 4.15 10*12/L (ref 3.95–5.13)
RED CELL DISTRIBUTION WIDTH: 14.5 % (ref 12.2–15.2)
WBC ADJUSTED: 15.2 10*9/L — ABNORMAL HIGH (ref 3.6–11.2)

## 2022-11-14 LAB — BASIC METABOLIC PANEL
ANION GAP: 7 mmol/L (ref 3–11)
BLOOD UREA NITROGEN: 8 mg/dL — ABNORMAL LOW (ref 9–23)
BUN / CREAT RATIO: 17
CALCIUM: 9.6 mg/dL (ref 8.7–10.4)
CHLORIDE: 98 mmol/L (ref 98–107)
CO2: 30.5 mmol/L (ref 20.0–31.0)
CREATININE: 0.48 mg/dL — ABNORMAL LOW (ref 0.50–0.80)
EGFR CKD-EPI (2021) FEMALE: >90 mL/min/{1.73_m2} (ref >=60)
GLUCOSE RANDOM: 161 mg/dL (ref 70–179)
POTASSIUM: 4.3 mmol/L (ref 3.5–5.1)
SODIUM: 135 mmol/L — ABNORMAL LOW (ref 136–145)

## 2022-11-14 MED ADMIN — apixaban (ELIQUIS) tablet 5 mg: 5 mg | ORAL | @ 02:00:00

## 2022-11-14 MED ADMIN — bosutinib Tab 400 mg: 400 mg | ORAL | @ 14:00:00 | Stop: 2022-11-14

## 2022-11-14 MED ADMIN — cloZAPine (CLOZARIL) tablet 300 mg: 300 mg | ORAL | @ 02:00:00

## 2022-11-14 MED ADMIN — insulin lispro (HumaLOG) injection 0-20 Units: 0-20 [IU] | SUBCUTANEOUS | @ 13:00:00 | Stop: 2022-11-14

## 2022-11-14 MED ADMIN — sertraline (ZOLOFT) tablet 50 mg: 50 mg | ORAL | @ 14:00:00 | Stop: 2022-11-14

## 2022-11-14 MED ADMIN — apixaban (ELIQUIS) tablet 5 mg: 5 mg | ORAL | @ 14:00:00 | Stop: 2022-11-14

## 2022-11-14 MED ADMIN — allopurinol (ZYLOPRIM) tablet 100 mg: 100 mg | ORAL | @ 14:00:00 | Stop: 2022-11-14

## 2022-11-14 MED ADMIN — famotidine (PEPCID) tablet 20 mg: 20 mg | ORAL | @ 14:00:00 | Stop: 2022-11-14

## 2022-11-14 MED ADMIN — QUEtiapine (SEROQUEL) tablet 50 mg: 50 mg | ORAL | @ 02:00:00

## 2022-11-14 MED ADMIN — pravastatin (PRAVACHOL) tablet 20 mg: 20 mg | ORAL | @ 02:00:00

## 2022-11-14 MED ADMIN — valproate (DEPAKENE) oral syrup: 1000 mg | ORAL | @ 14:00:00 | Stop: 2022-11-14

## 2022-11-14 MED ADMIN — valproate (DEPAKENE) oral syrup: 1000 mg | ORAL | @ 02:00:00

## 2022-11-14 MED ADMIN — metoPROLOL succinate (Toprol-XL) 24 hr tablet 25 mg: 25 mg | ORAL | @ 14:00:00 | Stop: 2022-11-14

## 2022-11-14 MED ADMIN — acetaminophen (TYLENOL) tablet 650 mg: 650 mg | ORAL | @ 14:00:00 | Stop: 2022-11-14

## 2022-11-14 MED ADMIN — insulin lispro (HumaLOG) injection 0-20 Units: 0-20 [IU] | SUBCUTANEOUS | @ 03:00:00

## 2022-11-14 MED ADMIN — cefTRIAXone (ROCEPHIN) 1 g in sodium chloride 0.9 % (NS) 100 mL IVPB-connector bag: 1 g | INTRAVENOUS | @ 09:00:00 | Stop: 2022-11-14

## 2022-11-14 MED ADMIN — metoPROLOL succinate (Toprol-XL) 24 hr tablet 25 mg: 25 mg | ORAL | @ 02:00:00

## 2022-11-14 MED ADMIN — famotidine (PEPCID) tablet 20 mg: 20 mg | ORAL | @ 02:00:00

## 2022-11-14 MED ADMIN — flu vaccine TS 2024-25(6mos up)(PF)(FLULAVAL, FLUARIX, FLUZONE): .5 mL | INTRAMUSCULAR | @ 18:00:00 | Stop: 2022-11-14

## 2022-11-14 NOTE — Unmapped (Addendum)
Kimberly Mathis is a 57 y.o.  female with PMH of schizoaffective disorder, PE, CML who presents to [facility] for fall. She was sent from SNF for fall, unknown LOC.  She is chronically a poor historian. She complains of some chronic pain in her back neck, knees, but no acute pain or injury from fall. She says she feels very tired. Denies dyspnea, chest pain, palpitations, dysuria, diarrhea, abdominal pain, nausea, vomiting, fever, chills. She is unable say how she fell, but says she fell on her back and was unable to get up. Per facility, downtime was unknown. For EMS she was hypoxic on room air and placed on supplemental O2. She says she wears some supplemental O2 sometimes, denies using CPAP. In ED,VSS, normal O2 sats on 2lpm O2. Labs with leukocytosis, VBG with hypercapnia with metabolic compensation, UA suggestive of UTI. Otherwise unremarkable CMP, trop, BNP, lactic acid. No acute findings on CT head, CTA head/neck, CXR.     Patient was admitted and treated for UTI with IV rocephin. Urine culture grew proteus again, will complete 7 day course or augmentin. She has been afebrile. Leukocytosis is stable in setting of CML. She was treated supportively for fall and UTI as well and her encephalopathy has gradually improved.     She was very lethargic initially on admission awakening only to painful stimuli. She was kept on CPAP during this period as obvious episodes of apnea and hypoxia especially while sleeping. Once her encephalopathy resolved and she returned more to her baseline she refused CPAP. She was intermittently compliant with O2. She should wear 2-3L/min O2 while sleeping to keep O2 sats  88-92%. Ideally she would have sleep study and attempt to treat her untreated sleep apnea, obesity hypoventilation syndrome, and chronic hypercapnic respiratory failure. Similar presentation/scenario occurred here back in march where she was noted to be apneic and hypoxic while sleeping. Since then it appears her clozapine has been reduced. She remains on seroquel and depakote which can be sedation. Depakote level was rechecked and was normal. She was kept on this regimen here with improvement in her mentation so doubtful the medications caused this acute change probably more related to UTI, fall. She appears back to her baseline, is talkative, eating and agreeable for discharge back to SNF.

## 2022-11-14 NOTE — Unmapped (Addendum)
Patient A&Ox3. Confusion at times. VSS on 2L/[place]. ACHS. CPAP at night. IV Abx.                       POC and fall prevention reviewed with patient. Bed alarm on. Call bell within reach. Will continue to monitor. Sepsis screen negative. AMP score of  4    Problem: Skin Injury Risk Increased  Goal: Skin Health and Integrity  Outcome: Progressing  Intervention: Optimize Skin Protection  Recent Flowsheet Documentation  Taken 11/14/2022 0300 by Bastos de Britis Saunders Revel, RN  Activity Management: dorsiflexion/plantar flexion performed  Pressure Reduction Techniques:   frequent weight shift encouraged   weight shift assistance provided  Head of Bed (HOB) Positioning: HOB at 20-30 degrees  Pressure Reduction Devices: pressure-redistributing mattress utilized  Skin Protection:   adhesive use limited   incontinence pads utilized  Taken 11/14/2022 0100 by Bastos de Britis Saunders Revel, RN  Activity Management: dorsiflexion/plantar flexion performed  Pressure Reduction Techniques:   frequent weight shift encouraged   weight shift assistance provided  Head of Bed (HOB) Positioning: HOB at 20-30 degrees  Pressure Reduction Devices: pressure-redistributing mattress utilized  Skin Protection:   adhesive use limited   incontinence pads utilized  Taken 11/13/2022 2300 by Bastos de Britis Saunders Revel, RN  Activity Management: dorsiflexion/plantar flexion performed  Pressure Reduction Techniques:   frequent weight shift encouraged   weight shift assistance provided  Head of Bed (HOB) Positioning: HOB at 20-30 degrees  Pressure Reduction Devices: pressure-redistributing mattress utilized  Skin Protection:   adhesive use limited   incontinence pads utilized  Taken 11/13/2022 2100 by Bastos de Britis Saunders Revel, RN  Activity Management: dorsiflexion/plantar flexion performed  Pressure Reduction Techniques:   frequent weight shift encouraged   weight shift assistance provided  Head of Bed (HOB) Positioning: HOB at 20-30 degrees  Pressure Reduction Devices: pressure-redistributing mattress utilized  Skin Protection:   adhesive use limited   incontinence pads utilized  Taken 11/13/2022 2032 by Bastos de Britis Saunders Revel, RN  Pressure Reduction Techniques: frequent weight shift encouraged  Pressure Reduction Devices: pressure-redistributing mattress utilized  Skin Protection: adhesive use limited  Taken 11/13/2022 1915 by Rica Records de Britis Saunders Revel, RN  Activity Management: dorsiflexion/plantar flexion performed  Pressure Reduction Techniques:   frequent weight shift encouraged   weight shift assistance provided  Head of Bed (HOB) Positioning: HOB at 20-30 degrees  Pressure Reduction Devices: pressure-redistributing mattress utilized  Skin Protection:   adhesive use limited   incontinence pads utilized     Problem: Fall Injury Risk  Goal: Absence of Fall and Fall-Related Injury  Outcome: Progressing  Intervention: Promote Scientist, clinical (histocompatibility and immunogenetics) Documentation  Taken 11/14/2022 0300 by [employer] de Britis Saunders Revel, RN  Safety Interventions:   bed alarm   fall reduction program maintained   lighting adjusted for tasks/safety   low bed   nonskid shoes/slippers when out of bed  Taken 11/14/2022 0100 by Bastos de Britis Saunders Revel, RN  Safety Interventions:   bed alarm   fall reduction program maintained   lighting adjusted for tasks/safety   low bed   nonskid shoes/slippers when out of bed  Taken 11/13/2022 2300 by Bastos de Britis Saunders Revel, RN  Safety Interventions:   bed alarm   fall reduction program maintained   lighting adjusted for tasks/safety   low bed   nonskid shoes/slippers when out of bed  Taken 11/13/2022 2100 by Bastos de Britis Saunders Revel, RN  Safety Interventions:   bed alarm   fall reduction program maintained   lighting adjusted for tasks/safety   low bed   nonskid shoes/slippers when out of bed  Taken 11/13/2022 1915 by Bastos de Britis Saunders Revel, RN  Safety Interventions:   bed alarm   fall reduction program maintained   lighting adjusted for tasks/safety   low bed   nonskid shoes/slippers when out of bed     Problem: Diabetes  Goal: Optimal Coping  Outcome: Progressing  Goal: Optimal Functional Ability  Outcome: Progressing  Intervention: Optimize Functional Ability  Recent Flowsheet Documentation  Taken 11/14/2022 0300 by Bastos de Britis Saunders Revel, RN  Activity Management: dorsiflexion/plantar flexion performed  Taken 11/14/2022 0100 by Bastos de Britis Saunders Revel, RN  Activity Management: dorsiflexion/plantar flexion performed  Taken 11/13/2022 2300 by Bastos de Britis Saunders Revel, RN  Activity Management: dorsiflexion/plantar flexion performed  Taken 11/13/2022 2100 by Bastos de Britis Saunders Revel, RN  Activity Management: dorsiflexion/plantar flexion performed  Taken 11/13/2022 1915 by [facility] Records de Britis Saunders Revel, RN  Activity Management: dorsiflexion/plantar flexion performed  Goal: Blood Glucose Level Within Target Range  Outcome: Progressing  Goal: Minimize Risk of Hypoglycemia  Outcome: Progressing     Problem: Comorbidity Management  Goal: Maintenance of Asthma Control  Outcome: Progressing  Goal: Maintenance of Behavioral Health Symptom Control  Outcome: Progressing  Goal: Maintenance of COPD Symptom Control  Outcome: Progressing  Goal: Blood Glucose Levels Within Targeted Range  Outcome: Progressing  Goal: Maintenance of Heart Failure Symptom Control  Outcome: Progressing  Goal: Blood Pressure in Desired Range  Outcome: Progressing  Goal: Maintenance of Osteoarthritis Symptom Control  Outcome: Progressing  Intervention: Maintain Osteoarthritis Symptom Control  Recent Flowsheet Documentation  Taken 11/14/2022 0300 by Bastos de Britis Saunders Revel, RN  Activity Management: dorsiflexion/plantar flexion performed  Taken 11/14/2022 0100 by Bastos de Britis Saunders Revel, RN  Activity Management: dorsiflexion/plantar flexion performed  Taken 11/13/2022 2300 by Bastos de Britis Saunders Revel, RN  Activity Management: dorsiflexion/plantar flexion performed  Taken 11/13/2022 2100 by Bastos de Britis Saunders Revel, RN  Activity Management: dorsiflexion/plantar flexion performed  Taken 11/13/2022 1915 by [facility] Records de Britis Saunders Revel, RN  Activity Management: dorsiflexion/plantar flexion performed  Goal: Bariatric Home Regimen Maintained  Outcome: Progressing  Goal: Maintenance of Seizure Control  Outcome: Progressing     Problem: Adult Inpatient Plan of Care  Goal: Plan of Care Review  Outcome: Progressing  Flowsheets (Taken 11/14/2022 0350)  Progress: improving  Plan of Care Reviewed With: patient  Goal: Patient-Specific Goal (Individualized)  Outcome: Progressing  Goal: Absence of Hospital-Acquired Illness or Injury  Outcome: Progressing  Intervention: Identify and Manage Fall Risk  Recent Flowsheet Documentation  Taken 11/14/2022 0300 by Bastos de Britis Saunders Revel, RN  Safety Interventions:   bed alarm   fall reduction program maintained   lighting adjusted for tasks/safety   low bed   nonskid shoes/slippers when out of bed  Taken 11/14/2022 0100 by Bastos de Britis Saunders Revel, RN  Safety Interventions:   bed alarm   fall reduction program maintained   lighting adjusted for tasks/safety   low bed   nonskid shoes/slippers when out of bed  Taken 11/13/2022 2300 by Bastos de Britis Saunders Revel, RN  Safety Interventions:   bed alarm   fall reduction program maintained   lighting adjusted for tasks/safety   low bed   nonskid shoes/slippers when out of bed  Taken 11/13/2022 2100 by Bastos de Britis Saunders Revel, RN  Safety Interventions:   bed alarm   fall reduction program maintained   lighting adjusted for tasks/safety   low bed   nonskid shoes/slippers when out of bed  Taken 11/13/2022 1915 by Bastos de Britis Saunders Revel, RN  Safety Interventions:   bed alarm   fall reduction program maintained   lighting adjusted for tasks/safety   low bed   nonskid shoes/slippers when out of bed  Intervention: Prevent Skin Injury  Recent Flowsheet Documentation  Taken 11/14/2022 0300 by Bastos de Britis Saunders Revel, RN  Positioning for Skin:   Left   Supine/Back  Skin Protection:   adhesive use limited   incontinence pads utilized  Taken 11/14/2022 0100 by Bastos de Britis Saunders Revel, RN  Positioning for Skin: Right  Skin Protection:   adhesive use limited   incontinence pads utilized  Taken 11/13/2022 2300 by Bastos de Britis Saunders Revel, RN  Positioning for Skin: Left  Skin Protection:   adhesive use limited   incontinence pads utilized  Taken 11/13/2022 2100 by Bastos de Britis Saunders Revel, RN  Positioning for Skin:   Right   Supine/Back  Device Skin Pressure Protection: adhesive use limited  Skin Protection:   adhesive use limited   incontinence pads utilized  Taken 11/13/2022 2032 by Bastos de Britis Saunders Revel, RN  Device Skin Pressure Protection: adhesive use limited  Skin Protection: adhesive use limited  Taken 11/13/2022 1915 by [facility] Records de Britis Saunders Revel, RN  Positioning for Skin: Right  Skin Protection:   adhesive use limited   incontinence pads utilized  Intervention: Prevent and Manage VTE (Venous Thromboembolism) Risk  Recent Flowsheet Documentation  Taken 11/13/2022 2032 by [place] Records de Britis Saunders Revel, RN  VTE Prevention/Management: anticoagulant therapy  Taken 11/13/2022 1915 by [facility] Records de Britis Filocha, Rebekkah Powless, RN  Anti-Embolism Intervention: (Eliquis) Other (Comment)  Goal: Optimal Comfort and Wellbeing  Outcome: Progressing  Goal: Readiness for Transition of Care  Outcome: Progressing  Goal: Rounds/Family Conference  Outcome: Progressing     Problem: Self-Care Deficit  Goal: Improved Ability to Complete Activities of Daily Living  Outcome: Progressing     Problem: UTI (Urinary Tract Infection)  Goal: Improved Infection Symptoms  Outcome: Progressing

## 2022-11-14 NOTE — Unmapped (Signed)
[facility] [place]  Discharge Summary            Admit date:    11/11/2022  Discharge date:   11/14/2022    Discharge Service:   [facility]  Discharge Provider:   Noel Gerold, PA-C  Discharge to:    To Skilled Nursing Facility  Condition at Discharge:  fair  Code Status:    Full Code    Patient Care Team:  Desiree Lucy, PA as PCP - General (Family Medicine)  Leodis Liverpool as Nurse Practitioner (Hematology and Oncology)  Zeidner, Windell Norfolk, MD as Consulting Physician (Medical Oncology)    Consults         none    Discharge Diagnoses     Principal Problem (Resolved):    Acute metabolic encephalopathy (POA: Yes)  Active Problems:    Type II diabetes mellitus (CMS-HCC) (POA: Yes)    HTN (hypertension) (POA: Yes)    Schizoaffective disorder, bipolar type (CMS-HCC) (POA: Yes)    CML (chronic myelocytic leukemia) (CMS-HCC) (POA: Yes)    Chronic pulmonary embolism without acute cor pulmonale (CMS-HCC) (POA: Yes)    Complicated UTI (urinary tract infection) (POA: Yes)    Obesity, Class III, BMI 40-49.9 (morbid obesity) (CMS-HCC) (POA: Yes)       Hospital Course     Chantia Cryer is a 57 y.o.  female with PMH of schizoaffective disorder, PE, CML who presents to [facility] for fall. She was sent from SNF for fall, unknown LOC.  She is chronically a poor historian. She complains of some chronic pain in her back neck, knees, but no acute pain or injury from fall. She says she feels very tired. Denies dyspnea, chest pain, palpitations, dysuria, diarrhea, abdominal pain, nausea, vomiting, fever, chills. She is unable say how she fell, but says she fell on her back and was unable to get up. Per facility, downtime was unknown. For EMS she was hypoxic on room air and placed on supplemental O2. She says she wears some supplemental O2 sometimes, denies using CPAP. In ED,VSS, normal O2 sats on 2lpm O2. Labs with leukocytosis, VBG with hypercapnia with metabolic compensation, UA suggestive of UTI. Otherwise unremarkable CMP, trop, BNP, lactic acid. No acute findings on CT head, CTA head/neck, CXR.     Patient was admitted and treated for UTI with IV rocephin. Urine culture grew proteus again, will complete 7 day course or augmentin. She has been afebrile. Leukocytosis is stable in setting of CML. She was treated supportively for fall and UTI as well and her encephalopathy has gradually improved.     She was very lethargic initially on admission awakening only to painful stimuli. She was kept on CPAP during this period as obvious episodes of apnea and hypoxia especially while sleeping. Once her encephalopathy resolved and she returned more to her baseline she refused CPAP. She was intermittently compliant with O2. She should wear 2-3L/min O2 while sleeping to keep O2 sats  88-92%. Ideally she would have sleep study and attempt to treat her untreated sleep apnea, obesity hypoventilation syndrome, and chronic hypercapnic respiratory failure. Similar presentation/scenario occurred here back in march where she was noted to be apneic and hypoxic while sleeping. Since then it appears her clozapine has been reduced. She remains on seroquel and depakote which can be sedation. Depakote level was rechecked and was normal. She was kept on this regimen here with improvement in her mentation so doubtful the medications caused this acute  change probably more related to UTI, fall. She appears back to her baseline, is talkative, eating and agreeable for discharge back to SNF.     I spent greater than 30 mins in the discharge of this patient.    Discharge Physical Exam  GEN: somnolent but easily awakens, O x 2.   HEENT: chronic drooling  CV: RRR, no murmurs, no edema  RESP: clear anteriorly  AB: Soft, NTTP    Procedures     * No surgery found *     * Surgery not found *    Discharge Medications        Your Medication List        START taking these medications      amoxicillin-clavulanate 875-125 mg per tablet  Commonly known as: AUGMENTIN  Take 1 tablet by mouth every twelve (12) hours for 4 days.  Start taking on: November 15, 2022            CONTINUE taking these medications      acetaminophen 325 MG tablet  Commonly known as: TYLENOL  Take 1 tablet (325 mg total) by mouth every six (6) hours as needed for fever or pain.     allopurinol 100 MG tablet  Commonly known as: ZYLOPRIM  Take 1 tablet (100 mg total) by mouth daily.     apixaban 5 mg Tab  Commonly known as: ELIQUIS  Take 1 tablet (5 mg total) by mouth two (2) times a day.     BIOTENE MOISTURIZING MOUTH Spry  Generic drug: saliva stimulant comb. no.3  1 spray every two (2) hours as needed.     BOSULIF 400 mg Tab  Generic drug: bosutinib  Take 1 tablet (400 mg) by mouth daily. Administer with food. Swallow tablet whole; do not cut, crush, break, or chew.     cloZAPine 100 MG tablet  Commonly known as: CLOZARIL  Take 3 tablets (300 mg total) by mouth nightly.     diclofenac sodium 1 % gel  Commonly known as: VOLTAREN  Apply 1 g topically every twelve (12) hours as needed for arthritis (apply to knees as needed).     famotidine 20 MG tablet  Commonly known as: PEPCID  Take 1 tablet (20 mg total) by mouth two (2) times a day.     hydrALAZINE 10 MG tablet  Commonly known as: APRESOLINE  Take 1 tablet (10 mg total) by mouth Three (3) times a day.     melatonin 5 mg Cap  Take 1 capsule by mouth nightly.     metFORMIN 500 MG tablet  Commonly known as: GLUCOPHAGE  Take 1 tablet (500 mg total) by mouth in the morning and 1 tablet (500 mg total) in the evening. Take with meals.  .     metFORMIN 1000 MG tablet  Commonly known as: GLUCOPHAGE  Take 1 tablet (1,000 mg total) by mouth in the morning.     metoPROLOL succinate 25 MG 24 hr tablet  Commonly known as: Toprol-XL  Take 1 tablet (25 mg total) by mouth two (2) times a day.     multivitamin per tablet  Commonly known as: TAB-A-VITE/THERAGRAN  Take 1 capsule by mouth daily.     ondansetron 4 MG tablet  Commonly known as: ZOFRAN  Take 1 tablet (4 mg total) by mouth every eight (8) hours as needed for nausea.     pravastatin 20 MG tablet  Commonly known as: PRAVACHOL  Take 1 tablet (20 mg total)  by mouth at bedtime.     QUEtiapine 50 MG tablet  Commonly known as: SEROQUEL  Take 1 tablet (50 mg total) by mouth nightly.     sertraline 50 MG tablet  Commonly known as: ZOLOFT  Take 1 tablet (50 mg total) by mouth daily.     valproic acid 250 mg capsule  Commonly known as: DEPAKENE  Take 4 capsules (1,000 mg total) by mouth in the morning and 4 capsules (1,000 mg total) in the evening.              Lab Results      BLOOD  Recent Labs     Units 11/12/22  0105 11/12/22  0410 11/13/22  0355 11/14/22  0545   WBC 10*9/L 15.5*  --  15.5* 15.2*   HGB g/dL 16.1* 09.6* 04.5* 40.9   HCT % 33.6*  --  31.3* 36.8   PLT 10*9/L 434  --  440 390     Recent Labs     Units 11/12/22  0105 11/12/22  0109 11/13/22  0355 11/14/22  0545   NA mmol/L 141  --  141 135*   K mmol/L 3.5  --  3.4* 4.3   CL mmol/L 98  --  100 98   CO2 mmol/L 34.1*  --  31.3* 30.5   BUN mg/dL 4*  --  6* 8*   CREATININE mg/dL 8.11  --  9.14 7.82*   GLU mg/dL 956  --  213 086   CALCIUM mg/dL 9.3  --  9.2 9.6   ALBUMIN g/dL 3.3*  --   --   --    PROT g/dL 9.8*  --   --   --    BILITOT mg/dL 0.2*  --   --   --    AST U/L 21  --   --   --    ALT U/L 18  --   --   --    ALKPHOS U/L 78  --   --   --    MG mg/dL 2.2  --  2.1  --    LACTATE mmol/L  --  0.8  --   --      Recent Labs     Units 11/12/22  0105   CKTOTAL U/L 107.0   TROPONINI ng/L 5     No results for input(s): INR, LABPROT, APTT, DDIMER in the last 168 hours.  Recent Labs     Units 11/12/22  0105   TSH uIU/mL 1.869   ETOH mg/dL <3   V7Q % 6.9*   IONGEXBM84 pg/ml 880      No results for input(s): HEPAIGM, HEPBSAG, HEPBIGM, HEPCAB, MITOAB in the last 168 hours.   URINE  Recent Labs     Units 11/12/22  0251   WBCUA /HPF 17*   NITRITE  Negative   LEUKOCYTESUR  1+*   BACTERIA /HPF Rare*   RBCUA /HPF 4*   BLOODU  Negative   GLUCOSEU  Negative   PROTEINUA  1+*   KETONESU  1+*     Recent Labs     Units 11/12/22  0251   OPIAU  Negative   BENZU  Negative   AMPHU  Negative   COCAU  Negative   CANNAU  Negative   BARBU  Negative      BODY FLUIDS  No results for input(s): FTYP1, WBCFLUID, FNEUT, LYMPHSFL, FMONO, EOSFL, RBCFL, CLARITYFLUID, COLORFL in the last 168 hours.   ABG  Recent Labs     11/12/22  1106   PHART 7.39   PCO2ART 56.4*   PO2ART 60.2*   HCO3ART 32*   O2SATART 90.2*   BEART 9.4*       Microbiology Results (last day)       Procedure Component Value Date/Time Date/Time    Urine culture [[id number]]  (Abnormal)  (Susceptibility) Collected: 11/12/22 0251    Lab Status: Final result Specimen: Urine from Catheterized-In and Out Catheter Updated: 11/14/22 0981     Urine Culture, Comprehensive >100,000 CFU/mL Proteus mirabilis    Narrative:      Specimen Source: Catheterized-In and Out Catheter      Susceptibility       Proteus mirabilis (1)       Antibiotic Interpretation Microscan Method Status    Amoxicillin + Clavulanate Susceptible  MIC SUSCEPTIBILITY RESULT Final    Ampicillin Susceptible  MIC SUSCEPTIBILITY RESULT Final    Cefazolin Susceptible  MIC SUSCEPTIBILITY RESULT Final    Ceftriaxone Susceptible  MIC SUSCEPTIBILITY RESULT Final    Cefuroxime Susceptible  MIC SUSCEPTIBILITY RESULT Final    Ciprofloxacin Susceptible  MIC SUSCEPTIBILITY RESULT Final     Breakpoints for Ciprofloxacin are based on a dosage regimen of 400 mg IV or 500 mg orally administered every 12 hours, or the equivalent dose after adjustment for renal function.       Gentamicin Intermediate  MIC SUSCEPTIBILITY RESULT Final    Levofloxacin Susceptible  MIC SUSCEPTIBILITY RESULT Final     Breakpoints for Levofloxacin are based on a dosage regimen of 750 mg  administered every 24 hours, or the equivalent dose after adjustment for renal function.       Nitrofurantoin Resistant  MIC SUSCEPTIBILITY RESULT Final Piperacillin + Tazobactam Susceptible  MIC SUSCEPTIBILITY RESULT Final    Tetracycline Resistant  MIC SUSCEPTIBILITY RESULT Final    Tobramycin Susceptible  MIC SUSCEPTIBILITY RESULT Final     Isolate requires high-dose extended-interval tobramycin at a dose of 7 mg/kg/dose.       Trimethoprim + Sulfamethoxazole Susceptible  MIC SUSCEPTIBILITY RESULT Final                               Imaging     CTA Head And Neck W Contrast    Result Date: 11/12/2022  Exam: CT Angiogram of the Neck and Head with Contrast History:  57 year old.  Altered mental status.  Slurred speech.  Weakness.  Lethargy and fall. Technique: CTA through the head and neck with IV contrast.  Image postprocessing and 3-D reconstructions at an independent workstation is performed and the rendered images that are generated are sent to the patient's image file in PACS. AEC (automated exposure control) and/or manual techniques such as size-specific kV and mAs are employed where appropriate to reduce radiation exposure for all CT exams. Comparison:  Head CT.  November 12, 2022. Findings: AORTIC ARCH:  The most common configuration of arch anatomy is present. The proximal great vessels are normal in appearance. VERTEBRAL ARTERIES:  Both vertebral arteries are normal in appearance. RIGHT CAROTID SYSTEM:  The right common carotid artery, internal carotid artery, and external carotid artery appear normal. LEFT CAROTID SYSTEM:  The left common carotid artery, internal carotid artery, and external carotid artery appear normal. COMMENT:  All measurements of stenosis in the report above were calculated using the NASCET [facility] American Symptomatic Carotid Endarterectomy Trial) methodology for measuring carotid artery stenosis, whereby the narrowest transverse  measurement of luminal dimension at the point of the maximum stenosis is stated relative to the diameter of the closest normal-appearing portion of the same vessel beyond the stenosis. INTRACRANIAL: The posterior communicating arteries are patent. No large vessel occlusion.  Mild atherosclerotic irregularity of the cavernous internal carotid arteries bilaterally. There are no cerebral aneurysms.     1.    Negative CT angiogram of the neck. 2.    Negative CT angiogram of the head. 3.      Mild atherosclerotic irregularity cavernous internal carotid arteries bilaterally. Signed (Electronic Signature): 11/12/2022 9:57 AM Signed By: Colan Neptune, MD    ECG 12 Lead    Result Date: 11/12/2022  SINUS TACHYCARDIA SEPTAL INFARCT  , AGE UNDETERMINED ABNORMAL ECG WHEN COMPARED WITH ECG OF 29-Mar-2022 17:34, NONSPECIFIC T WAVE ABNORMALITY, WORSE IN LATERAL LEADS Confirmed by Laney Pastor ([id number]) on 11/12/2022 4:31:08 AM    XR Chest Portable    Result Date: 11/12/2022  Exam:  Portable Chest History:  Altered mental status. Technique:  Single frontal view. Comparison:  CTA of the chest dated 10/21/2022 Findings:   Low lung volumes are present, which accentuate the cardiac silhouette and pulmonary vasculature, and causes crowding of the bronchovascular markings at the bases. Enlarged cardiac silhouette. No focal consolidation, pulmonary edema, pleural effusion, or pneumothorax.     Low lung volumes. No focal consolidation. Signed (Electronic Signature): 11/12/2022 2:12 AM Signed By: Bettye Boeck, MD    CT head without contrast    Result Date: 11/12/2022  Exam:  CT Head without Contrast History:  Fall, altered mental status Technique: Routine brain CT without IV contrast. AEC (automated exposure control) and/or manual techniques such as size-specific kV and mAs are employed where appropriate to reduce radiation exposure for all CT exams. Comparison:  None. Findings:  BRAIN:  No CT evidence of acute infarction, hemorrhage, edema, mass or mass effect. Ventricles and basilar cisterns are unremarkable. SOFT TISSUES:  Negative. CALVARIUM:  Negative. No fracture. SINUSES AND MASTOIDS:  No mucosal thickening or fluid. Negative head CT without contrast. Signed (Electronic Signature): 11/12/2022 2:01 AM Signed By: Bettye Boeck, MD     Discharge Instructions     Diet Instructions       Discharge diet (specify)      Discharge Nutrition Therapy: Regular          Activity Instructions       Activity as tolerated            Follow Up instructions and Outpatient Referrals     Discharge instructions          Discharge instructions as printed on AVS Take augmentin twice daily for 4 days to complete treatment for UTI Use oxygen 2-3L/min while sleeping to keep O2 sat 88-92%. Patient would greatly benefit from sleep study for CPAP however she has historically been noncompliant with CPAP mask but perhaps there are other modalities that could help treat her obvious sleep apnea.

## 2022-11-14 NOTE — Unmapped (Signed)
Patient discharging to [facility], report called into to [place].    Problem: Skin Injury Risk Increased  Goal: Skin Health and Integrity  Outcome: Resolved  Intervention: Optimize Skin Protection  Recent Flowsheet Documentation  Taken 11/14/2022 1100 by Livia Snellen, RN  Activity Management: (resting in bed with eyes closed) other (see comments)  Pressure Reduction Techniques:   frequent weight shift encouraged   heels elevated off bed  Head of Bed (HOB) Positioning: HOB at 20-30 degrees  Pressure Reduction Devices: pressure-redistributing mattress utilized  Skin Protection: adhesive use limited  Taken 11/14/2022 0900 by Livia Snellen, RN  Pressure Reduction Techniques:   frequent weight shift encouraged   heels elevated off bed  Pressure Reduction Devices: pressure-redistributing mattress utilized  Skin Protection: adhesive use limited  Taken 11/14/2022 0701 by Livia Snellen, RN  Activity Management: (resting in bed) other (see comments)  Pressure Reduction Techniques:   frequent weight shift encouraged   heels elevated off bed  Head of Bed (HOB) Positioning: HOB at 20-30 degrees  Pressure Reduction Devices: pressure-redistributing mattress utilized  Skin Protection: adhesive use limited

## 2022-11-14 NOTE — Unmapped (Signed)
Problem: Skin Injury Risk Increased  Goal: Skin Health and Integrity  Outcome: Progressing  Intervention: Optimize Skin Protection  Recent Flowsheet Documentation  Taken 11/13/2022 1700 by Janelle Floor, RN  Activity Management: dorsiflexion/plantar flexion performed  Pressure Reduction Techniques:   frequent weight shift encouraged   heels elevated off bed  Head of Bed (HOB) Positioning: HOB at 20-30 degrees  Pressure Reduction Devices:   positioning supports utilized   pressure-redistributing mattress utilized  Skin Protection: adhesive use limited  Taken 11/13/2022 1500 by Janelle Floor, RN  Activity Management: dorsiflexion/plantar flexion performed  Pressure Reduction Techniques:   frequent weight shift encouraged   heels elevated off bed  Head of Bed (HOB) Positioning: HOB at 20-30 degrees  Pressure Reduction Devices:   positioning supports utilized   pressure-redistributing mattress utilized  Skin Protection: adhesive use limited  Taken 11/13/2022 1300 by Janelle Floor, RN  Activity Management: dorsiflexion/plantar flexion performed  Pressure Reduction Techniques:   frequent weight shift encouraged   heels elevated off bed  Head of Bed (HOB) Positioning: HOB at 20-30 degrees  Pressure Reduction Devices:   positioning supports utilized   pressure-redistributing mattress utilized  Skin Protection: adhesive use limited  Taken 11/13/2022 1100 by Janelle Floor, RN  Activity Management: dorsiflexion/plantar flexion performed  Pressure Reduction Techniques:   frequent weight shift encouraged   heels elevated off bed  Head of Bed (HOB) Positioning: HOB at 20-30 degrees  Pressure Reduction Devices:   positioning supports utilized   pressure-redistributing mattress utilized  Skin Protection: adhesive use limited  Taken 11/13/2022 0900 by Janelle Floor, RN  Activity Management: dorsiflexion/plantar flexion performed  Pressure Reduction Techniques:   frequent weight shift encouraged   heels elevated off bed  Head of Bed (HOB) Positioning: HOB at 20-30 degrees  Pressure Reduction Devices:   positioning supports utilized   pressure-redistributing mattress utilized  Skin Protection: adhesive use limited  Taken 11/13/2022 0725 by Janelle Floor, RN  Activity Management: dorsiflexion/plantar flexion performed  Pressure Reduction Techniques:   frequent weight shift encouraged   heels elevated off bed  Head of Bed (HOB) Positioning: HOB at 20-30 degrees  Pressure Reduction Devices:   positioning supports utilized   pressure-redistributing mattress utilized  Skin Protection: adhesive use limited     Problem: Fall Injury Risk  Goal: Absence of Fall and Fall-Related Injury  Outcome: Progressing  Intervention: Promote Scientist, clinical (histocompatibility and immunogenetics) Documentation  Taken 11/13/2022 1700 by Janelle Floor, RN  Safety Interventions:   bed alarm   fall reduction program maintained   lighting adjusted for tasks/safety   low bed   nonskid shoes/slippers when out of bed  Taken 11/13/2022 1500 by Janelle Floor, RN  Safety Interventions:   bed alarm   fall reduction program maintained   lighting adjusted for tasks/safety   low bed   nonskid shoes/slippers when out of bed  Taken 11/13/2022 1300 by Janelle Floor, RN  Safety Interventions:   bed alarm   fall reduction program maintained   lighting adjusted for tasks/safety   low bed   nonskid shoes/slippers when out of bed  Taken 11/13/2022 1100 by Janelle Floor, RN  Safety Interventions:   bed alarm   fall reduction program maintained   lighting adjusted for tasks/safety   low bed   nonskid shoes/slippers when out of bed  Taken 11/13/2022 0900 by Janelle Floor, RN  Safety Interventions:   bed alarm   fall reduction program maintained  lighting adjusted for tasks/safety   low bed   nonskid shoes/slippers when out of bed  Taken 11/13/2022 0725 by Janelle Floor, RN  Safety Interventions:   bed alarm   fall reduction program maintained lighting adjusted for tasks/safety   low bed   nonskid shoes/slippers when out of bed     Problem: Diabetes  Goal: Optimal Coping  Outcome: Progressing  Goal: Optimal Functional Ability  Outcome: Progressing  Intervention: Optimize Functional Ability  Recent Flowsheet Documentation  Taken 11/13/2022 1700 by Janelle Floor, RN  Activity Management: dorsiflexion/plantar flexion performed  Taken 11/13/2022 1500 by Janelle Floor, RN  Activity Management: dorsiflexion/plantar flexion performed  Taken 11/13/2022 1300 by Janelle Floor, RN  Activity Management: dorsiflexion/plantar flexion performed  Taken 11/13/2022 1100 by Janelle Floor, RN  Activity Management: dorsiflexion/plantar flexion performed  Taken 11/13/2022 0900 by Janelle Floor, RN  Activity Management: dorsiflexion/plantar flexion performed  Taken 11/13/2022 0725 by Janelle Floor, RN  Activity Management: dorsiflexion/plantar flexion performed  Goal: Blood Glucose Level Within Target Range  Outcome: Progressing  Goal: Minimize Risk of Hypoglycemia  Outcome: Progressing     Problem: Comorbidity Management  Goal: Maintenance of Asthma Control  Outcome: Progressing  Goal: Maintenance of Behavioral Health Symptom Control  Outcome: Progressing  Goal: Maintenance of COPD Symptom Control  Outcome: Progressing  Goal: Blood Glucose Levels Within Targeted Range  Outcome: Progressing  Goal: Maintenance of Heart Failure Symptom Control  Outcome: Progressing  Goal: Blood Pressure in Desired Range  Outcome: Progressing  Goal: Maintenance of Osteoarthritis Symptom Control  Outcome: Progressing  Intervention: Maintain Osteoarthritis Symptom Control  Recent Flowsheet Documentation  Taken 11/13/2022 1700 by Janelle Floor, RN  Activity Management: dorsiflexion/plantar flexion performed  Taken 11/13/2022 1500 by Janelle Floor, RN  Activity Management: dorsiflexion/plantar flexion performed  Taken 11/13/2022 1300 by Janelle Floor, RN  Activity Management: dorsiflexion/plantar flexion performed  Taken 11/13/2022 1100 by Janelle Floor, RN  Activity Management: dorsiflexion/plantar flexion performed  Taken 11/13/2022 0900 by Janelle Floor, RN  Activity Management: dorsiflexion/plantar flexion performed  Taken 11/13/2022 0725 by Janelle Floor, RN  Activity Management: dorsiflexion/plantar flexion performed  Goal: Bariatric Home Regimen Maintained  Outcome: Progressing  Goal: Maintenance of Seizure Control  Outcome: Progressing     Problem: Adult Inpatient Plan of Care  Goal: Plan of Care Review  Outcome: Progressing  Goal: Patient-Specific Goal (Individualized)  Outcome: Progressing  Goal: Absence of Hospital-Acquired Illness or Injury  Outcome: Progressing  Intervention: Identify and Manage Fall Risk  Recent Flowsheet Documentation  Taken 11/13/2022 1700 by Janelle Floor, RN  Safety Interventions:   bed alarm   fall reduction program maintained   lighting adjusted for tasks/safety   low bed   nonskid shoes/slippers when out of bed  Taken 11/13/2022 1500 by Janelle Floor, RN  Safety Interventions:   bed alarm   fall reduction program maintained   lighting adjusted for tasks/safety   low bed   nonskid shoes/slippers when out of bed  Taken 11/13/2022 1300 by Janelle Floor, RN  Safety Interventions:   bed alarm   fall reduction program maintained   lighting adjusted for tasks/safety   low bed   nonskid shoes/slippers when out of bed  Taken 11/13/2022 1100 by Janelle Floor, RN  Safety Interventions:   bed alarm   fall reduction program maintained   lighting adjusted for tasks/safety   low bed   nonskid shoes/slippers when out of bed  Taken 11/13/2022 0900 by Janelle Floor, RN  Safety Interventions:   bed alarm   fall reduction program maintained   lighting adjusted for tasks/safety   low bed   nonskid shoes/slippers when out of bed  Taken 11/13/2022 0725 by Janelle Floor, RN  Safety Interventions:   bed alarm   fall reduction program maintained   lighting adjusted for tasks/safety   low bed   nonskid shoes/slippers when out of bed  Intervention: Prevent Skin Injury  Recent Flowsheet Documentation  Taken 11/13/2022 1700 by Janelle Floor, RN  Positioning for Skin:   Supine/Back   Left  Device Skin Pressure Protection: adhesive use limited  Skin Protection: adhesive use limited  Taken 11/13/2022 1500 by Janelle Floor, RN  Positioning for Skin:   Right   Supine/Back  Device Skin Pressure Protection: adhesive use limited  Skin Protection: adhesive use limited  Taken 11/13/2022 1300 by Janelle Floor, RN  Positioning for Skin:   Supine/Back   Left  Device Skin Pressure Protection: adhesive use limited  Skin Protection: adhesive use limited  Taken 11/13/2022 1100 by Janelle Floor, RN  Positioning for Skin:   Right   Supine/Back  Device Skin Pressure Protection: adhesive use limited  Skin Protection: adhesive use limited  Taken 11/13/2022 0900 by Janelle Floor, RN  Positioning for Skin:   Supine/Back   Left  Device Skin Pressure Protection: adhesive use limited  Skin Protection: adhesive use limited  Taken 11/13/2022 0725 by Janelle Floor, RN  Positioning for Skin:   Right   Supine/Back  Device Skin Pressure Protection: adhesive use limited  Skin Protection: adhesive use limited  Intervention: Prevent and Manage VTE (Venous Thromboembolism) Risk  Recent Flowsheet Documentation  Taken 11/13/2022 0745 by Janelle Floor, RN  VTE Prevention/Management: anticoagulant therapy  Goal: Optimal Comfort and Wellbeing  Outcome: Progressing  Goal: Readiness for Transition of Care  Outcome: Progressing  Goal: Rounds/Family Conference  Outcome: Progressing     Problem: UTI (Urinary Tract Infection)  Goal: Improved Infection Symptoms  Outcome: Progressing     Problem: Self-Care Deficit  Goal: Improved Ability to Complete Activities of Daily Living  Outcome: Progressing

## 2022-11-14 NOTE — Unmapped (Addendum)
Oxygen saturation on room air with patient at rest  = 91%    Oxygen saturation with exertion / ambulating on oxygen = 94% on 3 lpm      Pt is none ambulatory. O2 off the patient for 15 mins with desat 91% while in bed. After 45 mins patient Desat to 84% while asleep. Pt was placed back on 3L. PA was at bedside.

## 2022-11-20 NOTE — Unmapped (Signed)
[facility] Refill Coordination Note    Specialty Medication(s) to be Shipped:   Hematology/Oncology: Bosulif    Other medication(s) to be shipped: No additional medications requested for fill at this time     Kimberly Mathis, DOB: 09/03/1965  Phone: [phone] (home)       All above HIPAA information was verified with patient's caregiver, Toniann Fail      Was a translator used for this call? No    Completed refill call assessment today to schedule patient's medication shipment from the [facility]  [phone]).  All relevant notes have been reviewed.     Specialty medication(s) and dose(s) confirmed: Regimen is correct and unchanged.   Changes to medications: Evan reports no changes at this time.  Changes to insurance: No  New side effects reported not previously addressed with a pharmacist or physician: None reported  Questions for the pharmacist: No    Confirmed patient received a Conservation officer, historic buildings and a Surveyor, mining with first shipment. The patient will receive a drug information handout for each medication shipped and additional FDA Medication Guides as required.       DISEASE/MEDICATION-SPECIFIC INFORMATION        N/A    SPECIALTY MEDICATION ADHERENCE     Medication Adherence    Patient reported X missed doses in the last month: 0  Specialty Medication: BOSULIF 400 mg Tab (bosutinib)  Patient is on additional specialty medications: No  Patient is on more than two specialty medications: No  Any gaps in refill history greater than 2 weeks in the last 3 months: no  Demonstrates understanding of importance of adherence: yes  Support network for adherence: home health agency              Were doses missed due to medication being on hold? No    Bosulif 400 mg: 10 days of medicine on hand       REFERRAL TO PHARMACIST     Referral to the pharmacist: Not needed      [facility]     Shipping address confirmed in Epic.       Delivery Scheduled: Yes, Expected medication delivery date: 11/23/22.     Medication will be delivered via Same Day Courier to the prescription address in Epic WAM.    Moshe Salisbury   [facility]  Specialty Technician

## 2022-11-23 MED FILL — BOSULIF 400 MG TABLET: ORAL | 30 days supply | Qty: 30 | Fill #3

## 2022-12-15 DIAGNOSIS — C9210 Chronic myeloid leukemia, BCR/ABL-positive, not having achieved remission: Principal | ICD-10-CM

## 2022-12-15 NOTE — Unmapped (Unsigned)
[facility] Follow-up    Patient Name: Kimberly Mathis  Patient Age: 57 y.o.  Encounter Date: 12/18/2022    Primary Care Provider:  Desiree Lucy, PA    Referring Physician:  Haig Prophet, PA  [address]  [place],  [place] [address]    Reason for visit:   F/u visit for [facility]    Assessment:  Kimberly Mathis is a 57 y.o. 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 She began treatment on imatinib in 12/2015 with optimal response and meeting appropriate treatment milestones, except her one year PCR, which was slightly above goal.  She has been in a MMR and MR4.0 since 05/2018 until Jan 2021 when she lost MMR d/t caregiver change and missing medication. She was able to achieve MMR again until Oct 2022. She has had stable transcripts since Dec 2022, until her most recent level in June 2023 when it was discovered that she was not receiving her medication daily at her home. Her level did improve to a more normal state, however despite her facility giving her the medication daily, per the [facility], her BCR-ABL transcripts stayed elevated at  0.551 %. A BCR-ABL mutational panel was drawn which did not reveal the presence of any new mutations. Therefore the decision was made to switch her to Bosutinib 400 mg. She presents today approximately 5 months after starting the medication for repeat labs and an APP visit.     Kimberly Mathis presents to clinic today alone, without a caregiver or aid.  Her only complaint today is some left lower leg pain, but otherwise feels well with no complaints. Otherwise denies any new shortness of breath or increased side effects from her new medication. [name] brought with her just a medication list without a MAR. I will call her facility to review her medications and have a recent [facility] sent to [place] to ensure that she is receiving her medication everyday. She does have Bosutinib on her medication list, will confirm she is getting it everyday. On lab review today, CBC is fairly stable with WBC 10.3, Hgb 11.5, Plt 320, ANC 5.8. CMP is WNL. BCR-ABL is drawn today and is pending as it has been 3 months since her last value.     Our team will contact her facility that she needs a CNA or Aid to present with her at all times for safety reasons. Although I continue to request this, she has still presented to every appointment alone over the past few months. Will make the patients guardian,  Kimberly Mathis aware of this, as it continues to be a safety concern.  I will strongly advise and insist that patient come to all of her appointments in the future with a caregiver or aid from her home as well as a full MAR from the past 3 months. RTC in 3 months pending BCR-ABL    History of PE:  She had an admission back in April 2024 for a lethargy and shortness of breath, and was diagnosed with  pulmonary emboli within subsegmental and segmental branches to the bilateral upper and lower lobes. She was discharged on 4/1 and has been stable on Eliquis 5 mg twice a day. She has a new patient appointment with [facility] Hematology in October to determine duration.   - Continue Eliquis 5 mg twice a day    Left Leg Pain: Complaint seems consistent with previous episodes of  leg and knee pain. Tylenol 325 mg given in clinic for pain relief. Advised patient's facility on her paperwork to consider a LLE [place] if pain continues due to history of clotting. Did not feel safe sending patient to get a PVL alone today, given that she came without proper supervision.   - Tylenol 325 mg given for pain  - Recommend [place] at facility if pain continues over the next few days to rule out DVT    Plan and Recommendations:  - Continue Bosutinib 400 mg daily  - Discuss  facility to ensure that patient has an aid sent with her when coming to these appointments  - Will reach out to patient's guardian with updates and to inform her that the patient continues to come without an aid  - Recommended full and updated MAR from the past 3 months when patient comes to appointments    - Patients facility will fax over  - Continue Eliquis 5 mg twice daily for hx of PE indefinitely due to risk factors   - Recommended LLE [place] if leg pain persists   - RTC in 3 months      Kimberly Mathis was available    Kimberly [name] PA-C  Physician Assistant   Hematology/Oncology Division  [facility]  12/18/2022    I personally spent 40 minutes face-to-face and non-face-to-face in the care of this patient, which includes all pre, intra, and post visit time on the date of service.    History of Present Illness:  Hematology/Oncology History Overview Note   Diagnosis: CML    SOKAL Score:    Presentation: asymptomatic; leukocytosis found on routine CBC    Presenting WBC Count: 17,400    Bone Marrow Biopsy: not performed as patient unable to tolerate procedure.    Cytogenetics:  Abnormal FISH: An interphase FISH assay shows an abnormal signal pattern consistent with BCR/ABL1 fusion in 44% of the 100 cells scored. Of note, these cells contain an atypical abnormal signal pattern consistent with BCR/ABL1 rearrangement and loss of the ABL1/ASS1 region.    Treatment:  Imatinib 400 mg daily- 12/31/2015    12/31/15:  BCR-ABL1 p210 transcripts were detected at a level of 9.511 IS% ratio in blood.  BCR-ABL1 p190 transcripts were detected at a level of 3 in 100,000 cells in blood.    03/23/16:  BCR-ABL1 p210 transcripts were detected at a level of 0.702 IS % ratio in blood.    Normal FISH:  A BCR/ABL1 interphase FISH assay for the previously documented 9;22 translocation shows a normal signal pattern in 100% of the 200 nuclei scored     06/15/16:  BCR-ABL1 p210 transcripts were detected at a level of 0.272 IS % ratio in blood.    09/2016:  0.298 IS %    12/18: 0.241 IS%    03/22/17: 0.072%  06/21/17: 0.052%  09/27/17: 0.125%  01/03/18: 0.040%  05/28/18: 0.008%  09/13/18: 0.003%  01/14/19: 0.181%  02/05/19: 0.149%  04/10/19: 0.037%  09/03/19: 0.040%  12/04/19: 0.004%  03/04/20: 0.032%  06/30/20: 0.018%  10/07/20: 0.307%  12/23/20: 0.351%  06/02/21: 3.311%  09/07/2021:  0.887%  12/07/2021: 2.530%  03/14/2022: 0. 496%  06/13/2022: 0.551%    07/28/2022: Started Bosutinib 400 mg (was being given Imatinib 400 mg as well per error from her facility)    08/14/2022: STOPPED Imatinib 400 mg (given instructions to continue Bosutinib 400 mg )    09/21/2022: 0.023%    12/18/2022: pending     CML (chronic myelocytic leukemia) (CMS-HCC)  12/28/2015 Initial Diagnosis    CML (chronic myelocytic leukemia) (RAF-HCC)         Interval History:  Kimberly Mathis presents to clinic today alone without an aid. She primarily complaints of left lower leg pain today, points fo shin and reports the pain is primarily anterior. States the pain worsened last night and continues to bother her this morning.  Denies any fevers, constipation, or diarrhea. Reports good energy recently. Denies any shortness of breath, feels her breathing has been okay.  She denies any n/v/d. Denies any new cough. Energy is stable. History difficult to obtain as patient presents alone without an aid or caregiver.     Past Medical, Surgical and Family History were reviewed and pertinent updates were made in the Electronic Medical Record    Review of Systems:  All other systems reviewed were negative.     ECOG Performance Status: 2    Past Medical History:  Past Medical History:   Diagnosis Date    GERD (gastroesophageal reflux disease)     Gout     HTN (hypertension)     Morbid obesity (CMS-HCC)     Osteoporosis     Schizoaffective disorder, bipolar type (CMS-HCC)     Type II diabetes mellitus (CMS-HCC)        Medications:  Current Outpatient Medications   Medication Sig Dispense Refill    acetaminophen (TYLENOL) 325 MG tablet Take 1 tablet (325 mg total) by mouth every six (6) hours as needed for fever or pain.      allopurinoL (ZYLOPRIM) 100 MG tablet Take 1 tablet (100 mg total) by mouth daily.      apixaban (ELIQUIS) 5 mg Tab Take 1 tablet (5 mg total) by mouth two (2) times a day.      bosutinib 400 mg Tab Take 1 tablet (400 mg) by mouth daily. Administer with food. Swallow tablet whole; do not cut, crush, break, or chew. 30 tablet 5    clozapine (CLOZARIL) 100 MG tablet Take 3 tablets (300 mg total) by mouth nightly.      diclofenac sodium (VOLTAREN) 1 % gel Apply 1 g topically every twelve (12) hours as needed for arthritis (apply to knees as needed).      famotidine (PEPCID) 20 MG tablet Take 1 tablet (20 mg total) by mouth two (2) times a day.      hydrALAZINE (APRESOLINE) 10 MG tablet Take 1 tablet (10 mg total) by mouth Three (3) times a day.      melatonin 5 mg cap Take 1 capsule by mouth nightly.      metFORMIN (GLUCOPHAGE) 1000 MG tablet Take 1 tablet (1,000 mg total) by mouth in the morning.      metFORMIN (GLUCOPHAGE) 500 MG tablet Take 1 tablet (500 mg total) by mouth in the morning and 1 tablet (500 mg total) in the evening. Take with meals.  .      metoPROLOL succinate (TOPROL-XL) 25 MG 24 hr tablet Take 1 tablet (25 mg total) by mouth two (2) times a day.      multivitamin (TAB-A-VITE/THERAGRAN) per tablet Take 1 capsule by mouth daily.      ondansetron (ZOFRAN) 4 MG tablet Take 1 tablet (4 mg total) by mouth every eight (8) hours as needed for nausea.      pravastatin (PRAVACHOL) 20 MG tablet Take 1 tablet (20 mg total) by mouth at bedtime.      QUEtiapine (SEROQUEL) 50 MG tablet Take 1 tablet (50 mg total)  by mouth nightly.      saliva stimulant comb. no.3 (BIOTENE MOISTURIZING MOUTH) Spry 1 spray every two (2) hours as needed.      sertraline (ZOLOFT) 50 MG tablet Take 1 tablet (50 mg total) by mouth daily.      valproic acid (DEPAKENE) 250 mg capsule Take 4 capsules (1,000 mg total) by mouth in the morning and 4 capsules (1,000 mg total) in the evening.       No current facility-administered medications for this visit.     Vital Signs:  BSA: There is no height or weight on file to calculate BSA.  Vitals:    12/18/22 0952   BP: 153/91   Pulse: 96   Resp: 18   Temp: 36.9 ??C (98.5 ??F)   SpO2: 98%       Physical Exam:  Constitutional: Obese woman sitting in wheelchair, no acute distress  Eyes: PERRL. No scleral icterus or conjunctival injection.  Cardiovascular:  Regular rate, regular rhythm  S1, S2.  No murmurs, gallops or rubs.   Respiratory:  Breathing is unlabored.  CTAB. No rales, ronchi or crackles.  Breath sounds mildly diminished throughout all lung fields.   GI:  No distention or pain on palpation.  Bowel sounds are present.  .  Musculoskeletal:   strength equal bilaterally. No swelling noted in the LE. Mild TTP on the left lower leg  Skin:  No rashes, petechiae or purpura.  grossly intact.   Neurologic:  alert and oriented to person/place, forced speech  Psychiatric: tangential requiring frequent re-direction    Relevant Laboratory, radiology and pathology results:  Clinical Support on 12/18/2022   Component Date Value Ref Range Status    Sodium 12/18/2022 142  135 - 145 mmol/L Final    Potassium 12/18/2022 4.1  3.4 - 4.8 mmol/L Final    Chloride 12/18/2022 98  98 - 107 mmol/L Final    CO2 12/18/2022 31.0  20.0 - 31.0 mmol/L Final    Anion Gap 12/18/2022 13  5 - 14 mmol/L Final    BUN 12/18/2022 7 (L)  9 - 23 mg/dL Final    Creatinine 24/40/1027 0.52 (L)  0.55 - 1.02 mg/dL Final    BUN/Creatinine Ratio 12/18/2022 13   Final    eGFR CKD-EPI (2021) Female 12/18/2022 >90  >=60 mL/min/1.24m2 Final    eGFR calculated with CKD-EPI 2021 equation in accordance with [employer] and [employer] of Nephrology Task Force recommendations.    Glucose 12/18/2022 151  70 - 179 mg/dL Final    Calcium 25/36/6440 9.5  8.7 - 10.4 mg/dL Final    Albumin 34/74/2595 4.0  3.4 - 5.0 g/dL Final    Total Protein 12/18/2022 8.8 (H)  5.7 - 8.2 g/dL Final    Total Bilirubin 12/18/2022 0.2 (L)  0.3 - 1.2 mg/dL Final    Mathis 63/87/5643 16  <=34 U/L Final ALT 12/18/2022 10  10 - 49 U/L Final    Alkaline Phosphatase 12/18/2022 81  46 - 116 U/L Final    WBC 12/18/2022 10.3  3.6 - 11.2 10*9/L Final    RBC 12/18/2022 4.05  3.95 - 5.13 10*12/L Final    HGB 12/18/2022 11.5  11.3 - 14.9 g/dL Final    HCT 32/95/1884 34.9  34.0 - 44.0 % Final    MCV 12/18/2022 86.1  77.6 - 95.7 fL Final    MCH 12/18/2022 28.5  25.9 - 32.4 pg Final    MCHC 12/18/2022 33.0  32.0 - 36.0  g/dL Final    RDW 21/30/8657 16.0 (H)  12.2 - 15.2 % Final    MPV 12/18/2022 8.2  6.8 - 10.7 fL Final    Platelet 12/18/2022 320  150 - 450 10*9/L Final    Neutrophils % 12/18/2022 56.4  % Final    Lymphocytes % 12/18/2022 31.8  % Final    Monocytes % 12/18/2022 9.3  % Final    Eosinophils % 12/18/2022 1.5  % Final    Basophils % 12/18/2022 1.0  % Final    Absolute Neutrophils 12/18/2022 5.8  1.8 - 7.8 10*9/L Final    Absolute Lymphocytes 12/18/2022 3.3  1.1 - 3.6 10*9/L Final    Absolute Monocytes 12/18/2022 1.0 (H)  0.3 - 0.8 10*9/L Final    Absolute Eosinophils 12/18/2022 0.2  0.0 - 0.5 10*9/L Final    Absolute Basophils 12/18/2022 0.1  0.0 - 0.1 10*9/L Final 11/12/2022 88.4  77.6 - 95.7 fL Final    MCH 11/12/2022 29.2  25.9 - 32.4 pg Final    MCHC 11/12/2022 33.0  32.0 - 36.0 g/dL Final    RDW 84/69/6295 14.3  12.2 - 15.2 % Final    MPV 11/12/2022 7.5  6.8 - 10.7 fL Final    Platelet 11/12/2022 434  150 - 450 10*9/L Final    Neutrophils % 11/12/2022 62.6  % Final    Lymphocytes % 11/12/2022 22.7  % Final    Monocytes % 11/12/2022 13.6  % Final    Eosinophils % 11/12/2022 0.7  % Final    Basophils % 11/12/2022 0.4  % Final    Absolute Neutrophils 11/12/2022 9.7 (H)  1.8 - 7.8 10*9/L Final    Absolute Lymphocytes 11/12/2022 3.5  1.1 - 3.6 10*9/L Final    Absolute Monocytes 11/12/2022 2.1 (H)  0.3 - 0.8 10*9/L Final    Absolute Eosinophils 11/12/2022 0.1  0.0 - 0.5 10*9/L Final    Absolute Basophils 11/12/2022 0.1  0.0 - 0.1 10*9/L Final    Lactate, Venous 11/12/2022 0.8  0.4 - 2.0 mmol/L Final    pH, Venous 11/12/2022 7.37  7.37 - 7.43 Final    Point of Care Testing performed at the point of care by trained personnel per documented policies.    pCO2, Ven 11/12/2022 60.7 (HH)  40.0 - 50.0 mmHg Final    pO2, Ven 11/12/2022 47.0  mmHg Final    O2 Saturation, Venous 11/12/2022 79.7  % Final    Base Excess, Ven 11/12/2022 9.4 (H)  -2.4 - 2.3 mmol/L Final    HCO3, Ven 11/12/2022 31.3 (H)  21.0 - 28.0 mmol/L Final    Hemoglobin 11/12/2022 10.5 (L)  12.0 - 15.5 g/dL Final    Point of Care Testing performed at the point of care by trained personnel per documented policies.    Hemoglobin A1C 11/12/2022 6.9 (H)  4.8 - 5.6 % Final    Estimated Average Glucose 11/12/2022 151  mg/dL Final    Vitamin M-84 13/24/4010 880  211 - 911 pg/ml Final    Glucose, POC 11/12/2022 156  70 - 179 mg/dL Final    Operator ID 27/25/3664 Rolena Infante   Final    Specimen Source 11/12/2022 Arterial   Final    FIO2 Arterial 11/12/2022 24%   Final    pH, Arterial 11/12/2022 7.39  7.35 - 7.45 Final    pO2, Arterial 11/12/2022 60.2 (L)  83.0 - 108.0 mm Hg Final    pCO2, Arterial 11/12/2022 56.4 (H)  35.0 -  45.0 mm Hg Final    HCO3 (Bicarbonate), Arterial 11/12/2022 32 (H)  21 - 28 mmol/L Final    Base Excess, Arterial 11/12/2022 9.4 (H)  -2.4 - 2.3 Final    O2 Sat, Arterial 11/12/2022 90.2 (L)  92.0 - 100.0 % Final    Amphetamines Screen, Ur 11/12/2022 Negative  <1000 ng/mL Final    Barbiturates Screen, Ur 11/12/2022 Negative  <200 ng/mL Final    Benzodiazepines Screen, Urine 11/12/2022 Negative  <200 ng/mL Final    Cannabinoids Screen, Ur 11/12/2022 Negative  <50 ng/mL Final    Cocaine(Metab.)Screen, Urine 11/12/2022 Negative  <300 ng/mL Final    Methadone Screen, Urine 11/12/2022 Negative  <300 ng/mL Final    Opiates Screen, Ur 11/12/2022 Negative  <300 ng/mL Final    Oxycodone Screen, Ur 11/12/2022 Negative  <100 ng/mL Final    Glucose, POC 11/12/2022 122  70 - 179 mg/dL Final    Operator ID 59/56/3875 Ricarda Frame   Final    Glucose, POC 11/12/2022 140  70 - 179 mg/dL Final    Operator ID 64/33/2951 Shyrl Numbers   Final    Glucose, POC 11/12/2022 151  70 - 179 mg/dL Final    Operator ID 88/41/6606 Magdalene Molly   Final    WBC 11/13/2022 15.5 (H)  3.6 - 11.2 10*9/L Final    RBC 11/13/2022 3.57 (L)  3.95 - 5.13 10*12/L Final    HGB 11/13/2022 10.2 (L)  11.3 - 14.9 g/dL Final    HCT 30/16/0109 31.3 (L)  34.0 - 44.0 % Final    MCV 11/13/2022 87.7  77.6 - 95.7 fL Final    MCH 11/13/2022 28.6  25.9 - 32.4 pg Final    MCHC 11/13/2022 32.6  32.0 - 36.0 g/dL Final    RDW 32/35/5732 14.2  12.2 - 15.2 % Final    MPV 11/13/2022 7.5  6.8 - 10.7 fL Final    Platelet 11/13/2022 440  150 - 450 10*9/L Final    Sodium 11/13/2022 141  136 - 145 mmol/L Final    Potassium 11/13/2022 3.4 (L)  3.5 - 5.1 mmol/L Final    Chloride 11/13/2022 100  98 - 107 mmol/L Final    CO2 11/13/2022 31.3 (H)  20.0 - 31.0 mmol/L Final    Anion Gap 11/13/2022 10  3 - 11 mmol/L Final    BUN 11/13/2022 6 (L)  9 - 23 mg/dL Final    Creatinine 20/25/4270 0.54  0.50 - 0.80 mg/dL Final    BUN/Creatinine Ratio 11/13/2022 11   Final    eGFR CKD-EPI (2021) Female 11/13/2022 >90  >=60 mL/min/1.66m2 Final    eGFR calculated with CKD-EPI 2021 equation in accordance with [employer] and [employer] of Nephrology Task Force recommendations.    Glucose 11/13/2022 149  70 - 179 mg/dL Final    Calcium 62/37/6283 9.2  8.7 - 10.4 mg/dL Final    Magnesium 15/17/6160 2.1  1.6 - 2.6 mg/dL Final    Glucose, POC 73/71/0626 175  70 - 179 mg/dL Final    Operator ID 94/85/4627 Thomes Lolling   Final    Glucose, POC 11/13/2022 189 (H)  70 - 179 mg/dL Final    Operator ID 03/50/0938 Gregor Hams .   Final    Valproic Acid, Total 11/13/2022 74.2  50.0 - 100.0 ug/mL Final    Glucose, POC 11/13/2022 169  70 - 179 mg/dL Final    Operator ID 18/29/9371 Thomes Lolling   Final  Glucose, POC 11/13/2022 171  70 - 179 mg/dL Final    Operator ID 81/19/1478 Thomes Lolling   Final    Glucose, POC 11/13/2022 173  70 - 179 mg/dL Final    Operator ID 29/56/2130 Hassell Halim   Final    WBC 11/14/2022 15.2 (H)  3.6 - 11.2 10*9/L Final    RBC 11/14/2022 4.15  3.95 - 5.13 10*12/L Final    HGB 11/14/2022 11.8  11.3 - 14.9 g/dL Final    HCT 86/57/8469 36.8  34.0 - 44.0 % Final    MCV 11/14/2022 88.6  77.6 - 95.7 fL Final    MCH 11/14/2022 28.4  25.9 - 32.4 pg Final    MCHC 11/14/2022 32.0  32.0 - 36.0 g/dL Final    RDW 62/95/2841 14.5  12.2 - 15.2 % Final    MPV 11/14/2022 7.5  6.8 - 10.7 fL Final    Platelet 11/14/2022 390  150 - 450 10*9/L Final    Sodium 11/14/2022 135 (L)  136 - 145 mmol/L Final    Potassium 11/14/2022 4.3  3.5 - 5.1 mmol/L Final    Chloride 11/14/2022 98  98 - 107 mmol/L Final    CO2 11/14/2022 30.5  20.0 - 31.0 mmol/L Final    Anion Gap 11/14/2022 7  3 - 11 mmol/L Final    BUN 11/14/2022 8 (L)  9 - 23 mg/dL Final    Creatinine 32/44/0102 0.48 (L)  0.50 - 0.80 mg/dL Final    BUN/Creatinine Ratio 11/14/2022 17   Final    eGFR CKD-EPI (2021) Female 11/14/2022 >90  >=60 mL/min/1.47m2 Final    eGFR calculated with CKD-EPI 2021 equation in accordance with [employer] and [employer] of Nephrology Task Force recommendations.    Glucose 11/14/2022 161  70 - 179 mg/dL Final    Calcium 72/53/6644 9.6  8.7 - 10.4 mg/dL Final    Glucose, POC 03/47/4259 163  70 - 179 mg/dL Final    Operator ID 56/38/7564 Leamon Arnt   Final    Glucose, POC 11/14/2022 146  70 - 179 mg/dL Final    Operator ID 33/29/5188 Hunt, Crystal   Final

## 2022-12-18 ENCOUNTER — Ambulatory Visit
Admit: 2022-12-18 | Discharge: 2022-12-18 | Payer: MEDICARE | Attending: Student in an Organized Health Care Education/Training Program | Primary: Student in an Organized Health Care Education/Training Program

## 2022-12-18 ENCOUNTER — Institutional Professional Consult (permissible substitution): Admit: 2022-12-18 | Discharge: 2022-12-18 | Payer: MEDICARE

## 2022-12-18 DIAGNOSIS — C9210 Chronic myeloid leukemia, BCR/ABL-positive, not having achieved remission: Principal | ICD-10-CM

## 2022-12-18 LAB — COMPREHENSIVE METABOLIC PANEL
ALBUMIN: 4 g/dL (ref 3.4–5.0)
ALKALINE PHOSPHATASE: 81 U/L (ref 46–116)
ALT (SGPT): 10 U/L (ref 10–49)
ANION GAP: 13 mmol/L (ref 5–14)
AST (SGOT): 16 U/L (ref <=34)
BILIRUBIN TOTAL: 0.2 mg/dL — ABNORMAL LOW (ref 0.3–1.2)
BLOOD UREA NITROGEN: 7 mg/dL — ABNORMAL LOW (ref 9–23)
BUN / CREAT RATIO: 13
CALCIUM: 9.5 mg/dL (ref 8.7–10.4)
CHLORIDE: 98 mmol/L (ref 98–107)
CO2: 31 mmol/L (ref 20.0–31.0)
CREATININE: 0.52 mg/dL — ABNORMAL LOW (ref 0.55–1.02)
EGFR CKD-EPI (2021) FEMALE: >90 mL/min/{1.73_m2} (ref >=60)
GLUCOSE RANDOM: 151 mg/dL (ref 70–179)
POTASSIUM: 4.1 mmol/L (ref 3.4–4.8)
PROTEIN TOTAL: 8.8 g/dL — ABNORMAL HIGH (ref 5.7–8.2)
SODIUM: 142 mmol/L (ref 135–145)

## 2022-12-18 LAB — CBC W/ AUTO DIFF
BASOPHILS ABSOLUTE COUNT: 0.1 10*9/L (ref 0.0–0.1)
BASOPHILS RELATIVE PERCENT: 1 %
EOSINOPHILS ABSOLUTE COUNT: 0.2 10*9/L (ref 0.0–0.5)
EOSINOPHILS RELATIVE PERCENT: 1.5 %
HEMATOCRIT: 34.9 % (ref 34.0–44.0)
HEMOGLOBIN: 11.5 g/dL (ref 11.3–14.9)
LYMPHOCYTES ABSOLUTE COUNT: 3.3 10*9/L (ref 1.1–3.6)
LYMPHOCYTES RELATIVE PERCENT: 31.8 %
MEAN CORPUSCULAR HEMOGLOBIN CONC: 33 g/dL (ref 32.0–36.0)
MEAN CORPUSCULAR HEMOGLOBIN: 28.5 pg (ref 25.9–32.4)
MEAN CORPUSCULAR VOLUME: 86.1 fL (ref 77.6–95.7)
MEAN PLATELET VOLUME: 8.2 fL (ref 6.8–10.7)
MONOCYTES ABSOLUTE COUNT: 1 10*9/L — ABNORMAL HIGH (ref 0.3–0.8)
MONOCYTES RELATIVE PERCENT: 9.3 %
NEUTROPHILS ABSOLUTE COUNT: 5.8 10*9/L (ref 1.8–7.8)
NEUTROPHILS RELATIVE PERCENT: 56.4 %
PLATELET COUNT: 320 10*9/L (ref 150–450)
RED BLOOD CELL COUNT: 4.05 10*12/L (ref 3.95–5.13)
RED CELL DISTRIBUTION WIDTH: 16 % — ABNORMAL HIGH (ref 12.2–15.2)
WBC ADJUSTED: 10.3 10*9/L (ref 3.6–11.2)

## 2022-12-18 MED ADMIN — acetaminophen (TYLENOL) 325 MG tablet: ORAL | @ 16:00:00 | Stop: 2022-12-18

## 2022-12-18 MED ADMIN — acetaminophen (TYLENOL) tablet 325 mg: 325 mg | ORAL | @ 16:00:00 | Stop: 2022-12-18

## 2022-12-18 NOTE — Unmapped (Signed)
Patient presented for lab draw prior to provider appointment today. Labs obtained and sent for processing. Patient tolerated procedure well. Gauze with coban to site. Patient is awaiting visit with provider in exam room.

## 2022-12-18 NOTE — Unmapped (Addendum)
It was great to see you today!    CML  Continue the Bosutinib 400 mg tablets every day for treatment of CML  - blood counts look good  - need to come back in 3 months for a follow up for labs and a clinic visit   - Continue Eliquis 5 mg twice a day to prevent blood clots     Leg Pain  - Gave tylenol 325 mg in clinic today to help with pain   - If pain persists over the next day, would recommend a left lower extremity ultrasound to rule out a blood clot  - would monitor, if isolated pain in the leg persists with US revealing no evidence of blood clot, could but reasonable for orthopedics evaluation  - Continue Tylenol and Voltaren Gel for pain  - As patient did not come with a CNA or aid today, I did not feel comfortable sending her alone for an ultrasound     If you have questions or concerns, you may call the Nurse call line at [phone].    Clinical Support on 12/18/2022   Component Date Value Ref Range Status    WBC 12/18/2022 10.3  3.6 - 11.2 10*9/L Final    RBC 12/18/2022 4.05  3.95 - 5.13 10*12/L Final    HGB 12/18/2022 11.5  11.3 - 14.9 g/dL Final    HCT 09/81/1914 34.9  34.0 - 44.0 % Final    MCV 12/18/2022 86.1  77.6 - 95.7 fL Final    MCH 12/18/2022 28.5  25.9 - 32.4 pg Final    MCHC 12/18/2022 33.0  32.0 - 36.0 g/dL Final    RDW 78/29/5621 16.0 (H)  12.2 - 15.2 % Final    MPV 12/18/2022 8.2  6.8 - 10.7 fL Final    Platelet 12/18/2022 320  150 - 450 10*9/L Final    Neutrophils % 12/18/2022 56.4  % Final    Lymphocytes % 12/18/2022 31.8  % Final    Monocytes % 12/18/2022 9.3  % Final    Eosinophils % 12/18/2022 1.5  % Final    Basophils % 12/18/2022 1.0  % Final    Absolute Neutrophils 12/18/2022 5.8  1.8 - 7.8 10*9/L Final    Absolute Lymphocytes 12/18/2022 3.3  1.1 - 3.6 10*9/L Final    Absolute Monocytes 12/18/2022 1.0 (H)  0.3 - 0.8 10*9/L Final    Absolute Eosinophils 12/18/2022 0.2  0.0 - 0.5 10*9/L Final    Absolute Basophils 12/18/2022 0.1  0.0 - 0.1 10*9/L Final

## 2022-12-19 NOTE — Unmapped (Signed)
[facility] Refill Coordination Note    Specialty Medication(s) to be Shipped:   Hematology/Oncology: Bosulif    Other medication(s) to be shipped: No additional medications requested for fill at this time     Kimberly Mathis, DOB: 31-Aug-1965  Phone: [phone] (home)       All above HIPAA information was verified with patient's caregiver, Kimberly Mathis      Was a translator used for this call? No    Completed refill call assessment today to schedule patient's medication shipment from the [facility]  [phone]).  All relevant notes have been reviewed.     Specialty medication(s) and dose(s) confirmed: Regimen is correct and unchanged.   Changes to medications: Kimberly Mathis reports no changes at this time.  Changes to insurance: No  New side effects reported not previously addressed with a pharmacist or physician: None reported  Questions for the pharmacist: No    Confirmed patient received a Conservation officer, historic buildings and a Surveyor, mining with first shipment. The patient will receive a drug information handout for each medication shipped and additional FDA Medication Guides as required.       DISEASE/MEDICATION-SPECIFIC INFORMATION        N/A    SPECIALTY MEDICATION ADHERENCE     Medication Adherence    Patient reported X missed doses in the last month: 0  Specialty Medication: Bosulif 400 mg  Patient is on additional specialty medications: No  Informant: caregiver  Support network for adherence: home health agency     Were doses missed due to medication being on hold? No    Bosulif 400 mg: 14 days of medicine on hand       REFERRAL TO PHARMACIST     Referral to the pharmacist: Not needed      [facility]     Shipping address confirmed in Epic.       Delivery Scheduled: Yes, Expected medication delivery date: 01/02/23.     Medication will be delivered via Next Day Courier to the prescription address in [employer] [place].    Kimberly Mathis   [facility]  Specialty Technician

## 2023-01-01 MED FILL — BOSULIF 400 MG TABLET: ORAL | 30 days supply | Qty: 30 | Fill #4

## 2023-01-24 NOTE — Unmapped (Signed)
[facility] and Home Delivery Pharm[facility] Refill Coordination Note    Specialty Medication(s) to be Shipped:   Hematology/Oncology: Bosulif    Other medication(s) to be shipped: No additional medications requested for fill at this time     Kimberly Mathis, DOB: 09-01-1965  Phone: [phone] (home)       All above HIPAA information was verified with patient's caregiver, Kimberly Mathis      Was a translator used for this call? No    Completed refill call assessment today to schedule patient's medication shipment from the [facility]  [phone]).  All relevant notes have been reviewed.     Specialty medication(s) and dose(s) confirmed: Regimen is correct and unchanged.   Changes to medications: Tenya reports no changes at this time.  Changes to insurance: No  New side effects reported not previously addressed with a pharmacist or physician: None reported  Questions for the pharmacist: No    Confirmed patient received a Conservation officer, historic buildings and a Surveyor, mining with first shipment. The patient will receive a drug information handout for each medication shipped and additional FDA Medication Guides as required.       DISEASE/MEDICATION-SPECIFIC INFORMATION        N/A    SPECIALTY MEDICATION ADHERENCE     Medication Adherence    Patient reported X missed doses in the last month: 0  Specialty Medication: Bosulif 400 mg  Patient is on additional specialty medications: No  Informant: caregiver  Support network for adherence: home health agency     Were doses missed due to medication being on hold? No    Bosulif 400 mg: 14 days of medicine on hand       REFERRAL TO PHARMACIST     Referral to the pharmacist: Not needed      [facility]     Shipping address confirmed in Epic.       Delivery Scheduled: Yes, Expected medication delivery date: 02/02/23.     Medication will be delivered via Next Day Courier to the prescription address in Epic [place].    Leanette Eutsler M [name]   [facility] and [facility] Delivery Pharm[facility]  Specialty Technician

## 2023-02-01 MED FILL — BOSULIF 400 MG TABLET: ORAL | 30 days supply | Qty: 30 | Fill #5

## 2023-02-21 DIAGNOSIS — C9210 Chronic myeloid leukemia, BCR/ABL-positive, not having achieved remission: Principal | ICD-10-CM

## 2023-02-21 MED ORDER — BOSULIF 400 MG TABLET
ORAL_TABLET | Freq: Every day | ORAL | 5 refills | 30.00 days | Status: CP
Start: 2023-02-21 — End: ?
  Filled 2023-03-05: qty 30, 30d supply, fill #0

## 2023-02-21 NOTE — Unmapped (Signed)
 [facility] and Home Delivery Pharmacy Refill Coordination Note    Specialty Medication(s) to be Shipped:   Hematology/Oncology: Bosulif    Other medication(s) to be shipped: No additional medications requested for fill at this time     Kimberly Mathis, DOB: 1965-08-02  Phone: [phone] (home)       All above HIPAA information was verified with patient's caregiver, Kimberly Mathis      Was a translator used for this call? No    Completed refill call assessment today to schedule patient's medication shipment from the [facility]  [phone]).  All relevant notes have been reviewed.     Specialty medication(s) and dose(s) confirmed: Regimen is correct and unchanged.   Changes to medications: Kimberly Mathis reports no changes at this time.  Changes to insurance: No  New side effects reported not previously addressed with a pharmacist or physician: None reported  Questions for the pharmacist: No    Confirmed patient received a Conservation officer, historic buildings and a Surveyor, mining with first shipment. The patient will receive a drug information handout for each medication shipped and additional FDA Medication Guides as required.       DISEASE/MEDICATION-SPECIFIC INFORMATION        N/A    SPECIALTY MEDICATION ADHERENCE     Medication Adherence    Patient reported X missed doses in the last month: 0  Specialty Medication: Bosulif 400 mg  Patient is on additional specialty medications: No  Informant: caregiver  Support network for adherence: home health agency     Were doses missed due to medication being on hold? No    Bosulif 400 mg: 16 days of medicine on hand       REFERRAL TO PHARMACIST     Referral to the pharmacist: Not needed      [facility]     Shipping address confirmed in Epic.       Delivery Scheduled: Yes, Expected medication delivery date: 03/06/23.     Medication will be delivered via Next Day Courier to the prescription address in Epic [place].    [name] M [name]   [facility] Delivery Pharm[facility]  Specialty Technician

## 2023-04-04 NOTE — Unmapped (Signed)
 [facility] Delivery Pharm[facility] Clinical Assessment & Refill Coordination Note    Kimberly Mathis, DOB: July 06, 1965  Phone: [phone] (home)     All above HIPAA information was verified with patient's caregiver, Nurse at rehab facility.     Was a Nurse, learning disability used for this call? No    Specialty Medication(s):   Hematology/Oncology: Bosulif     Current Outpatient Medications   Medication Sig Dispense Refill    acetaminophen (TYLENOL) 325 MG tablet Take 1 tablet (325 mg total) by mouth every six (6) hours as needed for fever or pain.      allopurinoL (ZYLOPRIM) 100 MG tablet Take 1 tablet (100 mg total) by mouth daily.      apixaban (ELIQUIS) 5 mg Tab Take 1 tablet (5 mg total) by mouth two (2) times a day.      bosutinib (BOSULIF) 400 mg Tab Take 1 tablet (400 mg) by mouth daily. Administer with food. Swallow tablet whole; do not cut, crush, break, or chew. 30 tablet 5    clozapine (CLOZARIL) 100 MG tablet Take 3 tablets (300 mg total) by mouth nightly.      diclofenac sodium (VOLTAREN) 1 % gel Apply 1 g topically every twelve (12) hours as needed for arthritis (apply to knees as needed).      famotidine (PEPCID) 20 MG tablet Take 1 tablet (20 mg total) by mouth two (2) times a day.      hydrALAZINE (APRESOLINE) 10 MG tablet Take 1 tablet (10 mg total) by mouth Three (3) times a day.      melatonin 5 mg cap Take 1 capsule by mouth nightly.      metFORMIN (GLUCOPHAGE) 1000 MG tablet Take 1 tablet (1,000 mg total) by mouth in the morning.      metFORMIN (GLUCOPHAGE) 500 MG tablet Take 1 tablet (500 mg total) by mouth in the morning and 1 tablet (500 mg total) in the evening. Take with meals.  .      metoPROLOL succinate (TOPROL-XL) 25 MG 24 hr tablet Take 1 tablet (25 mg total) by mouth two (2) times a day.      multivitamin (TAB-A-VITE/THERAGRAN) per tablet Take 1 capsule by mouth daily.      ondansetron (ZOFRAN) 4 MG tablet Take 1 tablet (4 mg total) by mouth every eight (8) hours as needed for nausea. pravastatin (PRAVACHOL) 20 MG tablet Take 1 tablet (20 mg total) by mouth at bedtime.      QUEtiapine (SEROQUEL) 50 MG tablet Take 1 tablet (50 mg total) by mouth nightly.      saliva stimulant comb. no.3 (BIOTENE MOISTURIZING MOUTH) Spry 1 spray every two (2) hours as needed.      sertraline (ZOLOFT) 50 MG tablet Take 1 tablet (50 mg total) by mouth daily.      valproic acid (DEPAKENE) 250 mg capsule Take 4 capsules (1,000 mg total) by mouth in the morning and 4 capsules (1,000 mg total) in the evening.       No current facility-administered medications for this visit.        Changes to medications:  Did not discuss    Medication list has been reviewed and updated in Epic:  Last Reviewed by Haig Prophet, PA on 12/18/2022     No Known Allergies    Changes to allergies: No    Allergies have been reviewed and updated in Epic:  Last Reviewed by Haig Prophet, PA on 12/18/2022     SPECIALTY MEDICATION  ADHERENCE     Bosulif 400 mg: 12 days of medicine on hand     Medication Adherence    Patient reported X missed doses in the last month: 0  Specialty Medication: Bosulif 400 mg once daily  Informant: caregiver  Support network for adherence: home health agency          Specialty medication(s) dose(s) confirmed: Regimen is correct and unchanged.     Are there any concerns with adherence? No    Adherence counseling provided? Not needed    CLINICAL MANAGEMENT AND INTERVENTION      Clinical Benefit Assessment:    Do you feel the medicine is effective or helping your condition? Yes    Clinical Benefit counseling provided? Not needed    Adverse Effects Assessment:    Are you experiencing any side effects? No    Are you experiencing difficulty administering your medicine? No    Quality of Life Assessment:    Quality of Life    Rheumatology  Oncology  Dermatology  Cystic Fibrosis          How many days over the past month did your CML  keep you from your normal activities? For example, brushing your teeth or getting up in the morning. Patient declined to answer    Have you discussed this with your provider? Not needed    Acute Infection Status:    Acute infections noted within Epic:  No active infections    Patient reported infection: None    Therapy Appropriateness:    Is therapy appropriate based on current medication list, adverse reactions, adherence, clinical benefit and progress toward achieving therapeutic goals? Yes, therapy is appropriate and should be continued     Clinical Intervention:    Was an intervention completed as part of this clinical assessment? No    DISEASE/MEDICATION-SPECIFIC INFORMATION      N/A    Oncology: Is the patient receiving adequate infection prevention treatment? Not applicable  Does the patient have adequate nutritional support? Not applicable    PATIENT SPECIFIC NEEDS     Does the patient have any physical, cognitive, or cultural barriers? No    Is the patient high risk? No    Does the patient require physician intervention or other additional services (i.e., nutrition, smoking cessation, social work)? No    Does the patient have an additional or emergency contact listed in their chart? Yes    SOCIAL DETERMINANTS OF HEALTH     At the [facility], we have learned that life circumstances - like trouble affording food, housing, utilities, or transportation can affect the health of many of our patients.   That is why we wanted to ask: are you currently experiencing any life circumstances that are negatively impacting your health and/or quality of life? Patient declined to answer    Social Drivers of Health     Food Insecurity: No Food Insecurity (04/01/2018)    Hunger Vital Sign     Worried About Running Out of Food in the Last Year: Never true     Ran Out of Food in the Last Year: Never true   Tobacco Use: Medium Risk (12/18/2022)    Patient History     Smoking Tobacco Use: Former     Smokeless Tobacco Use: Never     Passive Exposure: Not on file   Transportation Needs: No Transportation Needs (04/01/2018)    PRAPARE - Therapist, art (Medical): No  Lack of Transportation (Non-Medical): No   Alcohol Use: Not on file   Housing: Unknown (04/10/2020)    Housing     Within the past 12 months, have you ever stayed: outside, in a car, in a tent, in an overnight shelter, or temporarily in someone else's home (i.e. couch-surfing)?: Patient refused     Are you worried about losing your housing?: Not on file   Physical Activity: Unknown (04/01/2018)    Exercise Vital Sign     Days of Exercise per Week: Patient declined     Minutes of Exercise per Session: Patient declined   Utilities: Not on file   Stress: Not on file   Interpersonal Safety: Not on file   Substance Use: Not on file (11/07/2022)   Intimate Partner Violence: Unknown (04/01/2018)    Humiliation, Afraid, Rape, and Kick questionnaire     Fear of Current or Ex-Partner: Patient declined     Emotionally Abused: Patient declined     Physically Abused: Patient declined     Sexually Abused: Patient declined   Social Connections: Unknown (04/01/2018)    Social Connection and Isolation Panel [NHANES]     Frequency of Communication with Friends and Family: Patient declined     Frequency of Social Gatherings with Friends and Family: Patient declined     Attends Religious Services: Patient declined     Database administrator or Organizations: Patient declined     Attends Banker Meetings: Patient declined     Marital Status: Patient declined   Physicist, medical Strain: Low Risk  (04/01/2018)    Overall Financial Resource Strain (CARDIA)     Difficulty of Paying Living Expenses: Not very hard   Depression: Not at risk (09/21/2022)    PHQ-2     PHQ-2 Score: 0   Internet Connectivity: Not on file   Health Literacy: Not on file (04/10/2020)       Would you be willing to receive help with any of the needs that you have identified today? Not applicable       SHIPPING     Specialty Medication(s) to be Shipped: Hematology/Oncology: Bosulif    Other medication(s) to be shipped: No additional medications requested for fill at this time     Changes to insurance: No    Cost and Payment: Patient has a $0 copay, payment information is not required.    Delivery Scheduled: Yes, Expected medication delivery date: 04/12/23.     Medication will be delivered via Next Day Courier to the confirmed prescription address in [facility]    The patient will receive a drug information handout for each medication shipped and additional FDA Medication Guides as required.  Verified that patient has previously received a Conservation officer, historic buildings and a Surveyor, mining.    The patient or caregiver noted above participated in the development of this care plan and knows that they can request review of or adjustments to the care plan at any time.      All of the patient's questions and concerns have been addressed.    Kermit Balo, [facility]   [facility] Delivery [facility] Specialty Pharmacist

## 2023-04-11 MED FILL — BOSULIF 400 MG TABLET: ORAL | 30 days supply | Qty: 30 | Fill #1

## 2023-05-07 NOTE — Unmapped (Signed)
 The [facility] has made a second and final attempt to reach this patient to refill the following medication:Bosulif.      We have left voicemails on the following phone numbers: [phone], have left voicemail with Crystal at the following phone numbers: [phone], and have sent a MyChart message.    Dates contacted: 4/29,5/5  Last scheduled delivery: 04/11/23    The patient may be at risk of non-compliance with this medication. The patient should call the [facility] at [phone]  Option 4, then Option 1: Oncology to refill medication.    [facility] Specialty Technician

## 2023-05-16 DIAGNOSIS — C9210 Chronic myeloid leukemia, BCR/ABL-positive, not having achieved remission: Principal | ICD-10-CM

## 2023-05-18 NOTE — Unmapped (Unsigned)
 [facility] Follow-up    Patient Name: Kimberly Mathis  Patient Age: 58 y.o.  Encounter Date: 05/22/2023    Primary Care Provider:  Allyn Arenas, PA    Referring Physician:  Viki Graver, PA  [address]  [place],  [place] [address]    Reason for visit:   F/u visit for [facility]    Assessment:  Kimberly Mathis is a 58 y.o. 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 She began treatment on imatinib in 12/2015 with optimal response and meeting appropriate treatment milestones, except her one year PCR, which was slightly above goal.  She has been in a MMR and MR4.0 since 05/2018 until Jan 2021 when she lost MMR d/t caregiver change and missing medication. She was able to achieve MMR again until Oct 2022. She has had stable transcripts since Dec 2022, until her most recent level in June 2023 when it was discovered that she was not receiving her medication daily at her home. Her level did improve to a more normal state, however despite her facility giving her the medication daily, per the MAR, her BCR-ABL transcripts stayed elevated at  0.551 %. A BCR-ABL mutational panel was drawn which did not reveal the presence of any new mutations. Therefore the decision was made to switch her to Bosutinib 400 mg. She presents today approximately 10 months after starting the medication for repeat labs and an APP visit.     Kimberly Mathis presents to clinic today alone, without a caregiver or aid.  Her left lower leg pain is stable, but otherwise feels well with no complaints. Otherwise denies any new shortness of breath or increased side effects from her new medication. Kimberly Mathis brought with her just a medication list without a MAR. I will call her facility to review her medications and have a recent [facility] sent to us  to ensure that she is receiving her medication everyday. She does have Bosutinib on her medication list, will confirm she is getting it everyday. On lab review today, CBC is fairly stable with WBC 10.2, Hgb 11.2, Plt 233, ANC 4.6. CMP is WNL. BCR-ABL is drawn today and is pending, last check was in December 2024.     Our team will contact her facility that she needs a CNA or Aid to present with her at all times for safety reasons. Although I continue to request this, she has still presented to every appointment alone over the past few months. Will make the patients guardian,  Bartolo Bors aware of this, as it continues to be a safety concern.  I will strongly advise and insist that patient come to all of her appointments in the future with a caregiver or aid from her home as well as a full MAR from the past 3 months. RTC in 3 months pending BCR-ABL.     History of PE:  She had an admission back in April 2024 for a lethargy and shortness of breath, and was diagnosed with  pulmonary emboli within subsegmental and segmental branches to the bilateral upper and lower lobes. She was discharged on 4/1 and has been stable on Eliquis 5 mg twice a day. She has a new patient appointment with [facility] in October to determine duration.   - Continue Eliquis 5 mg twice a day      Plan and Recommendations:  - Continue Bosutinib 400 mg daily  - Discuss  facility to ensure that patient has an aid sent with her when coming to these appointments  - Will reach out to patient's guardian with updates and to inform her that the patient continues to come without an aid  - Recommended full and updated MAR from the past 3 months when patient comes to appointments    - Patients facility will fax over  - Continue Eliquis 5 mg twice daily for hx of PE indefinitely due to risk factors   - RTC in 3 months      Dr. Melynda Stagger was available    Terrence Ferron PA-C  Physician Assistant   Hematology/Oncology Division  [facility]  05/22/2023    I personally spent 40 minutes face-to-face and non-face-to-face in the care of this patient, which includes all pre, intra, and post visit time on the date of service.    History of Present Illness:  Hematology/Oncology History Overview Note   Diagnosis: CML    SOKAL Score:    Presentation: asymptomatic; leukocytosis found on routine CBC    Presenting WBC Count: 17,400    Bone Marrow Biopsy: not performed as patient unable to tolerate procedure.    Cytogenetics:  Abnormal FISH: An interphase FISH assay shows an abnormal signal pattern consistent with BCR/ABL1 fusion in 44% of the 100 cells scored. Of note, these cells contain an atypical abnormal signal pattern consistent with BCR/ABL1 rearrangement and loss of the ABL1/ASS1 region.    Treatment:  Imatinib 400 mg daily- 12/31/2015    12/31/15:  BCR-ABL1 p210 transcripts were detected at a level of 9.511 IS% ratio in blood.  BCR-ABL1 p190 transcripts were detected at a level of 3 in 100,000 cells in blood.    03/23/16:  BCR-ABL1 p210 transcripts were detected at a level of 0.702 IS % ratio in blood.    Normal FISH:  A BCR/ABL1 interphase FISH assay for the previously documented 9;22 translocation shows a normal signal pattern in 100% of the 200 nuclei scored     06/15/16:  BCR-ABL1 p210 transcripts were detected at a level of 0.272 IS % ratio in blood.    09/2016:  0.298 IS %    12/18: 0.241 IS%    03/22/17: 0.072%  06/21/17: 0.052%  09/27/17: 0.125%  01/03/18: 0.040%  05/28/18: 0.008%  09/13/18: 0.003%  01/14/19: 0.181%  02/05/19: 0.149%  04/10/19: 0.037%  09/03/19: 0.040%  12/04/19: 0.004%  03/04/20: 0.032%  06/30/20: 0.018%  10/07/20: 0.307%  12/23/20: 0.351%  06/02/21: 3.311%  09/07/2021:  0.887%  12/07/2021: 2.530%  03/14/2022: 0. 496%  06/13/2022: 0.551%    07/28/2022: Started Bosutinib 400 mg (was being given Imatinib 400 mg as well per error from her facility)    08/14/2022: STOPPED Imatinib 400 mg (given instructions to continue Bosutinib 400 mg )    09/21/2022: 0.023%    12/18/2022: 0.008%    05/22/2023: pending     CML (chronic myelocytic leukemia)     12/28/2015 Initial Diagnosis    CML (chronic myelocytic leukemia) (RAF-HCC)         Interval History:  Ms. Franchini presents to clinic today alone without an aid. She reports no worsening pain today, leg pain is stable at her baseline.  Denies any fevers, constipation, or diarrhea. Reports good energy recently. Denies any shortness of breath, feels her breathing has been okay.  She denies any n/v/d. Denies any new cough. Energy is stable. History difficult to obtain as patient presents alone without an aid or caregiver and she became quite upset  when told she needed to stay without an aid     Past Medical, Surgical and Family History were reviewed and pertinent updates were made in the Electronic Medical Record    Review of Systems:  All other systems reviewed were negative.     ECOG Performance Status: 2    Past Medical History:  Past Medical History:   Diagnosis Date    GERD (gastroesophageal reflux disease)     Gout     HTN (hypertension)     Morbid obesity (CMS-HCC)     Osteoporosis     Schizoaffective disorder, bipolar type      Type II diabetes mellitus         Medications:  Current Outpatient Medications   Medication Sig Dispense Refill    acetaminophen (TYLENOL) 325 MG tablet Take 1 tablet (325 mg total) by mouth every six (6) hours as needed for fever or pain.      allopurinoL (ZYLOPRIM) 100 MG tablet Take 1 tablet (100 mg total) by mouth daily.      apixaban (ELIQUIS) 5 mg Tab Take 1 tablet (5 mg total) by mouth two (2) times a day.      bosutinib (BOSULIF) 400 mg Tab Take 1 tablet (400 mg) by mouth daily. Administer with food. Swallow tablet whole; do not cut, crush, break, or chew. 30 tablet 5    clozapine (CLOZARIL) 100 MG tablet Take 3 tablets (300 mg total) by mouth nightly.      diclofenac sodium (VOLTAREN) 1 % gel Apply 1 g topically every twelve (12) hours as needed for arthritis (apply to knees as needed).      famotidine (PEPCID) 20 MG tablet Take 1 tablet (20 mg total) by mouth two (2) times a day.      hydrALAZINE (APRESOLINE) 10 MG tablet Take 1 tablet (10 mg total) by mouth Three (3) times a day.      melatonin 5 mg cap Take 1 capsule by mouth nightly.      metFORMIN (GLUCOPHAGE) 1000 MG tablet Take 1 tablet (1,000 mg total) by mouth in the morning.      metFORMIN (GLUCOPHAGE) 500 MG tablet Take 1 tablet (500 mg total) by mouth in the morning and 1 tablet (500 mg total) in the evening. Take with meals.  .      metoPROLOL succinate (TOPROL-XL) 25 MG 24 hr tablet Take 1 tablet (25 mg total) by mouth two (2) times a day.      multivitamin (TAB-A-VITE/THERAGRAN) per tablet Take 1 capsule by mouth daily.      ondansetron (ZOFRAN) 4 MG tablet Take 1 tablet (4 mg total) by mouth every eight (8) hours as needed for nausea.      pravastatin (PRAVACHOL) 20 MG tablet Take 1 tablet (20 mg total) by mouth at bedtime.      QUEtiapine (SEROQUEL) 50 MG tablet Take 1 tablet (50 mg total) by mouth nightly.      saliva stimulant comb. no.3 (BIOTENE MOISTURIZING MOUTH) Spry 1 spray every two (2) hours as needed.      sertraline (ZOLOFT) 50 MG tablet Take 1 tablet (50 mg total) by mouth daily.      valproic acid (DEPAKENE) 250 mg capsule Take 4 capsules (1,000 mg total) by mouth in the morning and 4 capsules (1,000 mg total) in the evening.       No current facility-administered medications for this visit.     Vital Signs:  BSA: There is no height or weight  on file to calculate BSA.  Vitals:    05/22/23 1048   BP: 124/86   Pulse: 92   Resp: 15   Temp: 36.6 ??C (97.9 ??F)   SpO2: 99%         Physical Exam:  Constitutional: Obese woman sitting in wheelchair, no acute distress  Eyes: PERRL. No scleral icterus or conjunctival injection.  Cardiovascular:  Regular rate, regular rhythm  S1, S2.  No murmurs, gallops or rubs.   Respiratory:  Breathing is unlabored.  CTAB. No rales, ronchi or crackles.  Breath sounds mildly diminished throughout all lung fields.   GI:  No distention or pain on palpation.  Bowel sounds are present.  .  Musculoskeletal:   strength equal bilaterally. No swelling noted in the LE.   Skin:  No rashes, petechiae or purpura.  grossly intact.   Neurologic:  alert and oriented to person/place, forced speech  Psychiatric: tangential requiring frequent re-direction    Relevant Laboratory, radiology and pathology results:  Appointment on 05/22/2023   Component Date Value Ref Range Status    Sodium 05/22/2023 141  135 - 145 mmol/L Final    Potassium 05/22/2023 4.5  3.4 - 4.8 mmol/L Final    Chloride 05/22/2023 103  98 - 107 mmol/L Final    CO2 05/22/2023 30.0  20.0 - 31.0 mmol/L Final    Anion Gap 05/22/2023 8  5 - 14 mmol/L Final    BUN 05/22/2023 9  9 - 23 mg/dL Final    Creatinine 16/10/9602 0.46 (L)  0.55 - 1.02 mg/dL Final    BUN/Creatinine Ratio 05/22/2023 20   Final    eGFR CKD-EPI (2021) Female 05/22/2023 >90  >=60 mL/min/1.49m2 Final    eGFR calculated with CKD-EPI 2021 equation in accordance with [employer] and [employer] of Nephrology Task Force recommendations.    Glucose 05/22/2023 152  70 - 179 mg/dL Final    Calcium 54/09/8117 9.8  8.7 - 10.4 mg/dL Final    Albumin 14/78/2956 4.0  3.4 - 5.0 g/dL Final    Total Protein 05/22/2023 8.4 (H)  5.7 - 8.2 g/dL Final    Total Bilirubin 05/22/2023 0.2 (L)  0.3 - 1.2 mg/dL Final    AST 21/30/8657 17  <=34 U/L Final    ALT 05/22/2023 10  10 - 49 U/L Final    Alkaline Phosphatase 05/22/2023 65  46 - 116 U/L Final    WBC 05/22/2023 10.2  3.6 - 11.2 10*9/L Final    RBC 05/22/2023 3.97  3.95 - 5.13 10*12/L Final    HGB 05/22/2023 11.2 (L)  11.3 - 14.9 g/dL Final    HCT 84/69/6295 34.4  34.0 - 44.0 % Final    MCV 05/22/2023 86.8  77.6 - 95.7 fL Final    MCH 05/22/2023 28.2  25.9 - 32.4 pg Final    MCHC 05/22/2023 32.6  32.0 - 36.0 g/dL Final    RDW 28/41/3244 15.4 (H)  12.2 - 15.2 % Final    MPV 05/22/2023 8.7  6.8 - 10.7 fL Final    Platelet 05/22/2023 233  150 - 450 10*9/L Final    Neutrophils % 05/22/2023 45.4  % Final    Lymphocytes % 05/22/2023 43.1  % Final    Monocytes % 05/22/2023 9.2  % Final    Eosinophils % 05/22/2023 1.1  % Final    Basophils % 05/22/2023 1.2  % Final    Absolute Neutrophils 05/22/2023 4.6  1.8 - 7.8 10*9/L Final  Absolute Lymphocytes 05/22/2023 4.4 (H)  1.1 - 3.6 10*9/L Final    Absolute Monocytes 05/22/2023 0.9 (H)  0.3 - 0.8 10*9/L Final    Absolute Eosinophils 05/22/2023 0.1  0.0 - 0.5 10*9/L Final    Absolute Basophils 05/22/2023 0.1  0.0 - 0.1 10*9/L Final Final    AST 12/18/2022 16  <=34 U/L Final    ALT 12/18/2022 10  10 - 49 U/L Final    Alkaline Phosphatase 12/18/2022 81  46 - 116 U/L Final    WBC 12/18/2022 10.3  3.6 - 11.2 10*9/L Final    RBC 12/18/2022 4.05  3.95 - 5.13 10*12/L Final    HGB 12/18/2022 11.5  11.3 - 14.9 g/dL Final    HCT 16/10/9602 34.9  34.0 - 44.0 % Final    MCV 12/18/2022 86.1  77.6 - 95.7 fL Final    MCH 12/18/2022 28.5  25.9 - 32.4 pg Final    MCHC 12/18/2022 33.0  32.0 - 36.0 g/dL Final    RDW 54/09/8117 16.0 (H)  12.2 - 15.2 % Final    MPV 12/18/2022 8.2  6.8 - 10.7 fL Final    Platelet 12/18/2022 320  150 - 450 10*9/L Final    Neutrophils % 12/18/2022 56.4  % Final    Lymphocytes % 12/18/2022 31.8  % Final    Monocytes % 12/18/2022 9.3  % Final    Eosinophils % 12/18/2022 1.5  % Final    Basophils % 12/18/2022 1.0  % Final    Absolute Neutrophils 12/18/2022 5.8  1.8 - 7.8 10*9/L Final    Absolute Lymphocytes 12/18/2022 3.3  1.1 - 3.6 10*9/L Final    Absolute Monocytes 12/18/2022 1.0 (H)  0.3 - 0.8 10*9/L Final    Absolute Eosinophils 12/18/2022 0.2  0.0 - 0.5 10*9/L Final    Absolute Basophils 12/18/2022 0.1  0.0 - 0.1 10*9/L Final

## 2023-05-22 ENCOUNTER — Ambulatory Visit: Admit: 2023-05-22 | Discharge: 2023-05-23 | Payer: Medicare (Managed Care)

## 2023-05-22 ENCOUNTER — Ambulatory Visit
Admit: 2023-05-22 | Discharge: 2023-05-23 | Payer: Medicare (Managed Care) | Attending: Student in an Organized Health Care Education/Training Program | Primary: Student in an Organized Health Care Education/Training Program

## 2023-05-22 DIAGNOSIS — C9210 Chronic myeloid leukemia, BCR/ABL-positive, not having achieved remission: Principal | ICD-10-CM

## 2023-05-22 LAB — CBC W/ AUTO DIFF
BASOPHILS ABSOLUTE COUNT: 0.1 10*9/L (ref 0.0–0.1)
BASOPHILS RELATIVE PERCENT: 1.2 %
EOSINOPHILS ABSOLUTE COUNT: 0.1 10*9/L (ref 0.0–0.5)
EOSINOPHILS RELATIVE PERCENT: 1.1 %
HEMATOCRIT: 34.4 % (ref 34.0–44.0)
HEMOGLOBIN: 11.2 g/dL — ABNORMAL LOW (ref 11.3–14.9)
LYMPHOCYTES ABSOLUTE COUNT: 4.4 10*9/L — ABNORMAL HIGH (ref 1.1–3.6)
LYMPHOCYTES RELATIVE PERCENT: 43.1 %
MEAN CORPUSCULAR HEMOGLOBIN CONC: 32.6 g/dL (ref 32.0–36.0)
MEAN CORPUSCULAR HEMOGLOBIN: 28.2 pg (ref 25.9–32.4)
MEAN CORPUSCULAR VOLUME: 86.8 fL (ref 77.6–95.7)
MEAN PLATELET VOLUME: 8.7 fL (ref 6.8–10.7)
MONOCYTES ABSOLUTE COUNT: 0.9 10*9/L — ABNORMAL HIGH (ref 0.3–0.8)
MONOCYTES RELATIVE PERCENT: 9.2 %
NEUTROPHILS ABSOLUTE COUNT: 4.6 10*9/L (ref 1.8–7.8)
NEUTROPHILS RELATIVE PERCENT: 45.4 %
PLATELET COUNT: 233 10*9/L (ref 150–450)
RED BLOOD CELL COUNT: 3.97 10*12/L (ref 3.95–5.13)
RED CELL DISTRIBUTION WIDTH: 15.4 % — ABNORMAL HIGH (ref 12.2–15.2)
WBC ADJUSTED: 10.2 10*9/L (ref 3.6–11.2)

## 2023-05-22 LAB — COMPREHENSIVE METABOLIC PANEL
ALBUMIN: 4 g/dL (ref 3.4–5.0)
ALKALINE PHOSPHATASE: 65 U/L (ref 46–116)
ALT (SGPT): 10 U/L (ref 10–49)
ANION GAP: 8 mmol/L (ref 5–14)
AST (SGOT): 17 U/L (ref <=34)
BILIRUBIN TOTAL: 0.2 mg/dL — ABNORMAL LOW (ref 0.3–1.2)
BLOOD UREA NITROGEN: 9 mg/dL (ref 9–23)
BUN / CREAT RATIO: 20
CALCIUM: 9.8 mg/dL (ref 8.7–10.4)
CHLORIDE: 103 mmol/L (ref 98–107)
CO2: 30 mmol/L (ref 20.0–31.0)
CREATININE: 0.46 mg/dL — ABNORMAL LOW (ref 0.55–1.02)
EGFR CKD-EPI (2021) FEMALE: >90 mL/min/1.73m2 (ref >=60)
GLUCOSE RANDOM: 152 mg/dL (ref 70–179)
POTASSIUM: 4.5 mmol/L (ref 3.4–4.8)
PROTEIN TOTAL: 8.4 g/dL — ABNORMAL HIGH (ref 5.7–8.2)
SODIUM: 141 mmol/L (ref 135–145)

## 2023-05-22 NOTE — Unmapped (Addendum)
 It was great to see you today!     Please give Bosutinib 400 mg daily     Patient needs to be seen every 3 months!     If you have questions or concerns, you may call the Nurse call line at [phone].    Appointment on 05/22/2023   Component Date Value Ref Range Status    Sodium 05/22/2023 141  135 - 145 mmol/L Final    Potassium 05/22/2023 4.5  3.4 - 4.8 mmol/L Final    Chloride 05/22/2023 103  98 - 107 mmol/L Final    CO2 05/22/2023 30.0  20.0 - 31.0 mmol/L Final    Anion Gap 05/22/2023 8  5 - 14 mmol/L Final    BUN 05/22/2023 9  9 - 23 mg/dL Final    Creatinine 09/81/1914 0.46 (L)  0.55 - 1.02 mg/dL Final    BUN/Creatinine Ratio 05/22/2023 20   Final    eGFR CKD-EPI (2021) Female 05/22/2023 >90  >=60 mL/min/1.37m2 Final    eGFR calculated with CKD-EPI 2021 equation in accordance with [employer] and [employer] of Nephrology Task Force recommendations.    Glucose 05/22/2023 152  70 - 179 mg/dL Final    Calcium 78/29/5621 9.8  8.7 - 10.4 mg/dL Final    Albumin 30/86/5784 4.0  3.4 - 5.0 g/dL Final    Total Protein 05/22/2023 8.4 (H)  5.7 - 8.2 g/dL Final    Total Bilirubin 05/22/2023 0.2 (L)  0.3 - 1.2 mg/dL Final    AST 69/62/9528 17  <=34 U/L Final    ALT 05/22/2023 10  10 - 49 U/L Final    Alkaline Phosphatase 05/22/2023 65  46 - 116 U/L Final    WBC 05/22/2023 10.2  3.6 - 11.2 10*9/L Final    RBC 05/22/2023 3.97  3.95 - 5.13 10*12/L Final    HGB 05/22/2023 11.2 (L)  11.3 - 14.9 g/dL Final    HCT 41/32/4401 34.4  34.0 - 44.0 % Final    MCV 05/22/2023 86.8  77.6 - 95.7 fL Final    MCH 05/22/2023 28.2  25.9 - 32.4 pg Final    MCHC 05/22/2023 32.6  32.0 - 36.0 g/dL Final    RDW 02/72/5366 15.4 (H)  12.2 - 15.2 % Final    MPV 05/22/2023 8.7  6.8 - 10.7 fL Final    Platelet 05/22/2023 233  150 - 450 10*9/L Final    Neutrophils % 05/22/2023 45.4  % Final    Lymphocytes % 05/22/2023 43.1  % Final    Monocytes % 05/22/2023 9.2  % Final    Eosinophils % 05/22/2023 1.1  % Final    Basophils % 05/22/2023 1.2  % Final    Absolute Neutrophils 05/22/2023 4.6  1.8 - 7.8 10*9/L Final    Absolute Lymphocytes 05/22/2023 4.4 (H)  1.1 - 3.6 10*9/L Final    Absolute Monocytes 05/22/2023 0.9 (H)  0.3 - 0.8 10*9/L Final    Absolute Eosinophils 05/22/2023 0.1  0.0 - 0.5 10*9/L Final    Absolute Basophils 05/22/2023 0.1  0.0 - 0.1 10*9/L Final

## 2023-05-22 NOTE — Unmapped (Signed)
 Recommended patient wait in clinic today prior to transportation being called for her but she was insistent to let us  allow her to got to the cafeteria where she will be picked up from there. I expressed that this is a safety concern but patient became quite upset with me. Will contact patients guardian and her facility to again emphasize that without an aid this is a safety hazard to the patient. Staff member accompanied her from place to place and charge nurse was notified and made aware.

## 2023-05-23 DIAGNOSIS — C9210 Chronic myeloid leukemia, BCR/ABL-positive, not having achieved remission: Principal | ICD-10-CM

## 2023-05-23 NOTE — Unmapped (Signed)
 Attempted call x2 for [facility] records, no answer. Called this AM and spoke with RN to review medications and have the last 3 month of MAR records faxed to team. Fax number and direct number given if any questions arise. Explained when the patient comes for appointments she often wonders around and it is unclear the transportation process so it would be best to have an aid with the patient. RN verbalized understanding and will relay the message to the facility. No further questions.

## 2023-05-23 NOTE — Unmapped (Signed)
 [facility] Refill Coordination Note    Specialty Medication(s) to be Shipped:   Hematology/Oncology: Bosulif    Other medication(s) to be shipped: No additional medications requested for fill at this time     Kimberly Mathis, DOB: 1965-12-27  Phone: [phone] (home)       All above HIPAA information was verified with patient.     Was a Nurse, learning disability used for this call? No    Completed refill call assessment today to schedule patient's medication shipment from the [facility]  [phone]).  All relevant notes have been reviewed.     Specialty medication(s) and dose(s) confirmed: Regimen is correct and unchanged.   Changes to medications: Weda reports no changes at this time.  Changes to insurance: No  New side effects reported not previously addressed with a pharmacist or physician: None reported  Questions for the pharmacist: No    Confirmed patient received a Conservation officer, historic buildings and a Surveyor, mining with first shipment. The patient will receive a drug information handout for each medication shipped and additional FDA Medication Guides as required.       DISEASE/MEDICATION-SPECIFIC INFORMATION        N/A    SPECIALTY MEDICATION ADHERENCE     Medication Adherence    Specialty Medication: BOSULIF 400 mg Tab (bosutinib)  Patient is on additional specialty medications: No  Support network for adherence: home health agency              Were doses missed due to medication being on hold? No     BOSULIF 400 mg Tab (bosutinib): 7 days of medicine on hand       REFERRAL TO PHARMACIST     Referral to the pharmacist: Not needed      [facility]     Shipping address confirmed in Epic.     Cost and Payment: Patient has a $0 copay, payment information is not required.    Delivery Scheduled: Yes, Expected medication delivery date: 05/24/2023.     Medication will be delivered via Same Day Courier to the prescription address in Epic [place].    Lanny Plan   [facility]  Specialty Technician

## 2023-05-24 DIAGNOSIS — C9210 Chronic myeloid leukemia, BCR/ABL-positive, not having achieved remission: Principal | ICD-10-CM

## 2023-05-24 MED FILL — BOSULIF 400 MG TABLET: ORAL | 30 days supply | Qty: 30 | Fill #2

## 2023-05-24 NOTE — Unmapped (Signed)
 I spoke with family home on behalf of patient Kimberly Mathis to confirm appointments on the following date(s): 08/21/23 Labs and Return visit with Terrence Ferron.     Faith Graig Lawyer

## 2023-06-19 NOTE — Unmapped (Signed)
 [facility] Refill Coordination Note    Specialty Medication(s) to be Shipped:   Hematology/Oncology: Bosulif    Other medication(s) to be shipped: No additional medications requested for fill at this time     Kimberly Mathis, DOB: 10/11/1965  Phone: [phone] (home)       All above HIPAA information was verified with patient's caregiver, Kimberly Mathis     Was a translator used for this call? No    Completed refill call assessment today to schedule patient's medication shipment from the [facility]  [phone]).  All relevant notes have been reviewed.     Specialty medication(s) and dose(s) confirmed: Regimen is correct and unchanged.   Changes to medications: Kimberly Mathis reports no changes at this time.  Changes to insurance: No  New side effects reported not previously addressed with a pharmacist or physician: None reported  Questions for the pharmacist: No    Confirmed patient received a Conservation officer, historic buildings and a Surveyor, mining with first shipment. The patient will receive a drug information handout for each medication shipped and additional FDA Medication Guides as required.       DISEASE/MEDICATION-SPECIFIC INFORMATION        N/A    SPECIALTY MEDICATION ADHERENCE     Medication Adherence    Patient reported X missed doses in the last month: 0  Specialty Medication: Bosulif 400 mg  Patient is on additional specialty medications: No  Informant: caregiver  Support network for adherence: home health agency     Were doses missed due to medication being on hold? No    Bosulif 400 mg: 5 days of medicine on hand       REFERRAL TO PHARMACIST     Referral to the pharmacist: Not needed      [facility]     Shipping address confirmed in Epic.     Cost and Payment: Patient has a $0 copay, payment information is not required.    Delivery Scheduled: Yes, Expected medication delivery date: 06/21/2023.     Medication will be delivered via Next Day Courier to the prescription address in Epic [place].    Kimberly Mathis   [facility]  Specialty Technician

## 2023-06-20 MED FILL — BOSULIF 400 MG TABLET: ORAL | 30 days supply | Qty: 30 | Fill #3

## 2023-07-09 NOTE — Unmapped (Signed)
 [facility] Refill Coordination Note    Specialty Medication(s) to be Shipped:   Hematology/Oncology: Bosulif     Other medication(s) to be shipped: No additional medications requested for fill at this time     Kimberly Mathis, DOB: 1965/02/24  Phone: [phone] (home)       All above HIPAA information was verified with patient's caregiver, [name]     Was a translator used for this call? No    Completed refill call assessment today to schedule patient's medication shipment from the [facility]  [phone]).  All relevant notes have been reviewed.     Specialty medication(s) and dose(s) confirmed: Regimen is correct and unchanged.   Changes to medications: Verenise reports no changes at this time.  Changes to insurance: No  New side effects reported not previously addressed with a pharmacist or physician: None reported  Questions for the pharmacist: No    Confirmed patient received a Conservation officer, historic buildings and a Surveyor, mining with first shipment. The patient will receive a drug information handout for each medication shipped and additional FDA Medication Guides as required.       DISEASE/MEDICATION-SPECIFIC INFORMATION        N/A    SPECIALTY MEDICATION ADHERENCE     Medication Adherence    Patient reported X missed doses in the last month: 0  Specialty Medication: Bosulif  400 mg  Patient is on additional specialty medications: No  Informant: caregiver  Support network for adherence: home health agency     Were doses missed due to medication being on hold? No    Bosulif  400 mg: 14 days of medicine on hand       REFERRAL TO PHARMACIST     Referral to the pharmacist: Not needed      [facility]     Shipping address confirmed in Epic.     Cost and Payment: Patient has a $0 copay, payment information is not required.    Delivery Scheduled: Yes, Expected medication delivery date: 07/19/2023.     Medication will be delivered via Next Day Courier to the prescription address in Epic [place].    Kimberly Mathis   [facility]  Specialty Technician

## 2023-07-18 MED FILL — BOSULIF 400 MG TABLET: ORAL | 30 days supply | Qty: 30 | Fill #4

## 2023-08-08 NOTE — Unmapped (Signed)
 [facility] Refill Coordination Note    Specialty Medication(s) to be Shipped:   Hematology/Oncology: Bosulif     Other medication(s) to be shipped: No additional medications requested for fill at this time     Kimberly Mathis, DOB: 15-Mar-1965  Phone: [phone] (home)       All above HIPAA information was verified with patient's caregiver, Summer     Was a translator used for this call? No    Completed refill call assessment today to schedule patient's medication shipment from the [facility]  [phone]).  All relevant notes have been reviewed.     Specialty medication(s) and dose(s) confirmed: Regimen is correct and unchanged.   Changes to medications: Naydeline reports starting the following medications: Amoxicillin   Changes to insurance: No  New side effects reported not previously addressed with a pharmacist or physician: None reported  Questions for the pharmacist: No    Confirmed patient received a Conservation officer, historic buildings and a Surveyor, mining with first shipment. The patient will receive a drug information handout for each medication shipped and additional FDA Medication Guides as required.       DISEASE/MEDICATION-SPECIFIC INFORMATION        N/A    SPECIALTY MEDICATION ADHERENCE     Medication Adherence    Patient reported X missed doses in the last month: 0  Specialty Medication: Bosulif  400 mg  Patient is on additional specialty medications: No  Informant: caregiver  Support network for adherence: home health agency     Were doses missed due to medication being on hold? No    Bosulif  400 mg: 18 days of medicine on hand       REFERRAL TO PHARMACIST     Referral to the pharmacist: Not needed      [facility]     Shipping address confirmed in Epic.     Cost and Payment: Patient has a $0 copay, payment information is not required.    Delivery Scheduled: Yes, Expected medication delivery date: 08/22/2023.     Medication will be delivered via Next Day Courier to the prescription address in Epic [place].    Lonzo Saulter M Santer Torres   [facility]  Specialty Technician

## 2023-08-09 LAB — CBC W/ AUTO DIFF
BASOPHILS ABSOLUTE COUNT: 0.2 10*9/L — ABNORMAL HIGH (ref 0.0–0.1)
BASOPHILS RELATIVE PERCENT: 0.8 %
EOSINOPHILS ABSOLUTE COUNT: 0 10*9/L (ref 0.0–0.5)
EOSINOPHILS RELATIVE PERCENT: 0.2 %
HEMATOCRIT: 35.1 % (ref 34.0–44.0)
HEMOGLOBIN: 11.4 g/dL (ref 11.3–14.9)
LYMPHOCYTES ABSOLUTE COUNT: 4.5 10*9/L — ABNORMAL HIGH (ref 1.1–3.6)
LYMPHOCYTES RELATIVE PERCENT: 25 %
MEAN CORPUSCULAR HEMOGLOBIN CONC: 32.6 g/dL (ref 32.0–36.0)
MEAN CORPUSCULAR HEMOGLOBIN: 28 pg (ref 25.9–32.4)
MEAN CORPUSCULAR VOLUME: 85.9 fL (ref 77.6–95.7)
MEAN PLATELET VOLUME: 7.8 fL (ref 6.8–10.7)
MONOCYTES ABSOLUTE COUNT: 1.6 10*9/L — ABNORMAL HIGH (ref 0.3–0.8)
MONOCYTES RELATIVE PERCENT: 9.1 %
NEUTROPHILS ABSOLUTE COUNT: 11.6 10*9/L — ABNORMAL HIGH (ref 1.8–7.8)
NEUTROPHILS RELATIVE PERCENT: 64.9 %
PLATELET COUNT: 438 10*9/L (ref 150–450)
RED BLOOD CELL COUNT: 4.09 10*12/L (ref 3.95–5.13)
RED CELL DISTRIBUTION WIDTH: 16 % — ABNORMAL HIGH (ref 12.2–15.2)
WBC ADJUSTED: 17.9 10*9/L — ABNORMAL HIGH (ref 3.6–11.2)

## 2023-08-09 LAB — PRO-BNP: PRO-BNP: 79 pg/mL (ref 0.0–125.0)

## 2023-08-09 LAB — COMPREHENSIVE METABOLIC PANEL
ALBUMIN: 4 g/dL (ref 3.4–5.0)
ALKALINE PHOSPHATASE: 67 U/L (ref 46–116)
ALT (SGPT): 26 U/L (ref 7–40)
ANION GAP: 11 mmol/L (ref 3–11)
AST (SGOT): 7 U/L — ABNORMAL LOW (ref 13–40)
BILIRUBIN TOTAL: 0.3 mg/dL (ref 0.3–1.2)
BLOOD UREA NITROGEN: 10 mg/dL (ref 9–23)
BUN / CREAT RATIO: 15
CALCIUM: 9.5 mg/dL (ref 8.7–10.4)
CHLORIDE: 103 mmol/L (ref 98–107)
CO2: 31.1 mmol/L — ABNORMAL HIGH (ref 20.0–31.0)
CREATININE: 0.67 mg/dL (ref 0.50–0.80)
EGFR CKD-EPI (2021) FEMALE: >90 mL/min/1.73m2 (ref >=60)
GLUCOSE RANDOM: 114 mg/dL (ref 70–179)
POTASSIUM: 3.6 mmol/L (ref 3.5–5.1)
PROTEIN TOTAL: 9.6 g/dL — ABNORMAL HIGH (ref 5.7–8.2)
SODIUM: 145 mmol/L (ref 136–145)

## 2023-08-09 LAB — HIGH SENSITIVITY TROPONIN I - SINGLE: HIGH SENSITIVITY TROPONIN I: 6 ng/L (ref <=34)

## 2023-08-09 MED ORDER — CEFDINIR 300 MG CAPSULE
ORAL_CAPSULE | Freq: Every day | ORAL | 0 refills | 5.00000 days | Status: CP
Start: 2023-08-09 — End: 2023-08-14

## 2023-08-09 NOTE — Unmapped (Addendum)
 Pt arrives from [facility] with complaints of shortness of breath and increased oxygen requirement. Pt with recent diagnosis of pneumonia. Pt arrives on 2 liters oxygen by [place].

## 2023-08-09 NOTE — Unmapped (Signed)
 eMERGENCY dEPARTMENT eNCOUnter        CHIEF COMPLAINT    Chief Complaint   Patient presents with    Shortness of Breath       **Medical records, tests, and treatments from other / outside providers have been reviewed and pertinent findings included**:      [facility] note May 2025:    Kimberly Mathis is a 58 y.o. 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 She began treatment on imatinib  in 12/2015 with optimal response and meeting appropriate treatment milestones, except her one year PCR, which was slightly above goal.  She has been in a MMR and MR4.0 since 05/2018 until Jan 2021 when she lost MMR d/t caregiver change and missing medication. She was able to achieve MMR again until Oct 2022. She has had stable transcripts since Dec 2022, until her most recent level in June 2023 when it was discovered that she was not receiving her medication daily at her home. Her level did improve to a more normal state, however despite her facility giving her the medication daily, per the MAR, her BCR-ABL transcripts stayed elevated at  0.551 %. A BCR-ABL mutational panel was drawn which did not reveal the presence of any new mutations. Therefore the decision was made to switch her to Bosutinib 400 mg. She presents today approximately 10 months after starting the medication for repeat labs and an APP visit.      Kimberly Mathis presents to clinic today alone, without a caregiver or aid.  Her left lower leg pain is stable, but otherwise feels well with no complaints. Otherwise denies any new shortness of breath or increased side effects from her new medication. Kimberly Mathis brought with her just a medication list without a MAR. I will call her facility to review her medications and have a recent [facility] sent to us  to ensure that she is receiving her medication everyday. She does have Bosutinib on her medication list, will confirm she is getting it everyday. On lab review today, CBC is fairly stable with WBC 10.2, Hgb 11.2, Plt 233, ANC 4.6. CMP is WNL. BCR-ABL is drawn today and is pending, last check was in December 2024.      Our team will contact her facility that she needs a CNA or Aid to present with her at all times for safety reasons. Although I continue to request this, she has still presented to every appointment alone over the past few months. Will make the patients guardian,  Kimberly Mathis aware of this, as it continues to be a safety concern.  I will strongly advise and insist that patient come to all of her appointments in the future with a caregiver or aid from her home as well as a full MAR from the past 3 months. RTC in 3 months pending BCR-ABL.      History of PE:  She had an admission back in April 2024 for a lethargy and shortness of breath, and was diagnosed with  pulmonary emboli within subsegmental and segmental branches to the bilateral upper and lower lobes. She was discharged on 4/1 and has been stable on Eliquis  5 mg twice a day. She has a new patient appointment with [facility] in October to determine duration.   - Continue Eliquis  5 mg twice a day    Echocardiogram 2024:    Summary    1. Technically difficult study.    2. The  left ventricle is normal in size with upper normal wall thickness.    3. The left ventricular systolic function is normal, LVEF is visually  estimated at 60-65%.    4. The right ventricle is not well visualized but probably normal in size,  with probably normal systolic function.    5. The pulmonary systolic pressure cannot be estimated due to insufficient  TR signal.    6. The IVC is not well visualized precluding the ability to accurate assess  right atrial pressure.    7. There are no significant valvular abnormalities.    HPI    Kimberly Mathis is a 58 y.o. female who presents from a rehab facility with cough and some shortness of breath.  Reported room air saturations of 90% she is placed on 3 L nasal cannula during transport this is during a coughing episode.  Here she is breathing comfortably on room air at 97%.  It was reported she was recently treated for pneumonia but now off of antibiotics.  She is complaining of leg pain which sounds to be chronic by review of her chart also complaining of intermittent throat pain.  Denies associated fevers vomiting diarrhea.  History of oropharyngeal dysphagia and severe schizoaffective disorder.  Denies recent choking or aspiration events.  No history for congestive heart disease with normal ejection fraction in 2024 on echocardiogram.  She is on Eliquis  twice daily for history of pulm embolism.  Medications are given by facility.    REVIEW OF SYSTEMS      A complete review of systems has been performed;  Except positive and negative systems reviewed and mentioned in History of Present Illness, all other systems have been reviewed and are found to be negative.      PAST MEDICAL HISTORY    Past Medical History[1]    SURGICAL HISTORY    Past Surgical History[2]    CURRENT DOCUMENTED MEDICATIONS     Current Facility-Administered Medications:     azithromycin  (ZITHROMAX ) tablet 500 mg, 500 mg, Oral, Once, Kimberly Mathis James, MD    cefdinir  (OMNICEF ) capsule 600 mg, 600 mg, Oral, Once, Salif Tay James, MD    Current Outpatient Medications:     acetaminophen  (TYLENOL ) 325 MG tablet, Take 1 tablet (325 mg total) by mouth every six (6) hours as needed for fever or pain., Disp: , Rfl:     allopurinoL  (ZYLOPRIM ) 100 MG tablet, Take 1 tablet (100 mg total) by mouth daily., Disp: , Rfl:     apixaban  (ELIQUIS ) 5 mg Tab, Take 1 tablet (5 mg total) by mouth two (2) times a day., Disp: , Rfl:     azithromycin  (ZITHROMAX  Z-PAK) 250 MG tablet, Two tabs by mouth day #1.  1 tablet by mouth day 2 through 5., Disp: 6 tablet, Rfl: 0    bosutinib (BOSULIF ) 400 mg Tab, Take 1 tablet (400 mg) by mouth daily. Administer with food. Swallow tablet whole; do not cut, crush, break, or chew., Disp: 30 tablet, Rfl: 5    cefdinir  (OMNICEF ) 300 MG capsule, Take 2 capsules (600 mg total) by mouth daily for 5 days., Disp: 10 capsule, Rfl: 0    clozapine  (CLOZARIL ) 100 MG tablet, Take 3 tablets (300 mg total) by mouth nightly., Disp: , Rfl:     diclofenac sodium (VOLTAREN) 1 % gel, Apply 1 g topically every twelve (12) hours as needed for arthritis (apply to knees as needed)., Disp: , Rfl:     famotidine  (PEPCID ) 20 MG tablet, Take 1  tablet (20 mg total) by mouth two (2) times a day., Disp: , Rfl:     hydrALAZINE (APRESOLINE) 10 MG tablet, Take 1 tablet (10 mg total) by mouth Three (3) times a day., Disp: , Rfl:     melatonin 5 mg cap, Take 1 capsule by mouth nightly., Disp: , Rfl:     metFORMIN  (GLUCOPHAGE ) 1000 MG tablet, Take 1 tablet (1,000 mg total) by mouth in the morning., Disp: , Rfl:     metFORMIN  (GLUCOPHAGE ) 500 MG tablet, Take 1 tablet (500 mg total) by mouth in the morning and 1 tablet (500 mg total) in the evening. Take with meals.  ., Disp: , Rfl:     metoPROLOL  succinate (TOPROL -XL) 25 MG 24 hr tablet, Take 1 tablet (25 mg total) by mouth two (2) times a day., Disp: , Rfl:     multivitamin (TAB-A-VITE/THERAGRAN) per tablet, Take 1 capsule by mouth daily., Disp: , Rfl:     ondansetron  (ZOFRAN ) 4 MG tablet, Take 1 tablet (4 mg total) by mouth every eight (8) hours as needed for nausea., Disp: , Rfl:     pravastatin  (PRAVACHOL ) 20 MG tablet, Take 1 tablet (20 mg total) by mouth at bedtime., Disp: , Rfl:     QUEtiapine  (SEROQUEL ) 50 MG tablet, Take 1 tablet (50 mg total) by mouth nightly., Disp: , Rfl:     saliva stimulant comb. no.3 (BIOTENE MOISTURIZING MOUTH) Spry, 1 spray every two (2) hours as needed., Disp: , Rfl:     sertraline  (ZOLOFT ) 50 MG tablet, Take 1 tablet (50 mg total) by mouth daily., Disp: , Rfl:     valproic  acid (DEPAKENE ) 250 mg capsule, Take 4 capsules (1,000 mg total) by mouth in the morning and 4 capsules (1,000 mg total) in the evening., Disp: , Rfl:     ALLERGIES Allergies[3]    FAMILY HISTORY    Family History[4]    SOCIAL HISTORY    Social History[5]    PHYSICAL EXAM:     VITAL SIGNS: BP 110/83  - Pulse 90  - Temp 36.7 ??C (98 ??F) (Oral)  - Resp 23  - Ht 170.2 cm (5' 7)  - Wt (!) 129.3 kg (285 lb 0.9 oz)  - SpO2 92%  - BMI 44.65 kg/m??    General:  Awake and alert,  conversant, no acute discomfort / distress.  HENT:  NCAT.  No acute or subacute head trauma.  Oropharynx grossly normal. No facial edema.  Eyes:  Normal appearance, no icterus noted. No gaze preference or ocular palsy.  Conjunctiva is clear. No abnormal nystagmus with midline gaze. Pupils are symmetric and normal in size / shape.  Anterior chamber appears normal.  Neck:  Supple, normal ROM. No meningeal signs. No stridor. No jugular venous distention noted.  No soft tissue swelling or lymphadenopathy.   Heart: Regular rate and regular rhythm, no appreciable murmurs, no percardial friction rubs  Lungs:  Normal work of breathing, lungs sounds are clear and symmetric bilaterally. Good airflow.  No wheezing or rales. No chest wall crepitance.  Occasional cough productive of clear sputum.  Abdomen: Soft,  no tenderness and obese, no visible or palpable masses or hernias, no visible / palpable organomegaly.  No visible distention or ascites. No bruising to the abdominal wall.  Genitalia / Rectal / Perineum:   Deferred.   Back:  No visible soft tissue masses.  No midline or lateral bony deformities. No visible bruising.  Extremities:  No obvious deformity.  Grossly normal  ROM about all joints. No edema.  No evidence for soft tissue hematoma, injury, or mass.  Skin:  Warm, dry, no generalized or regional rash. No mucous membrane inflammation or lesions.  Neuro:  Awake and alert, non-focal exam, conversant with clear speech.  No tremors or muscle fasciculations noted.  No pronator drift.  Psych:  Calm and cooperative.  Mental status and affect normal, normal speech pattern and content. No active psychosis or delusions        EKG(S) ORDERED AND INTERPRETED DURING THIS VISIT:     Encounter Date: 08/09/23   ECG 12 Lead   Result Value    EKG Systolic BP     EKG Diastolic BP     EKG Ventricular Rate 95    EKG Atrial Rate 95    EKG P-R Interval 162    EKG QRS Duration 98    EKG Q-T Interval 348    EKG QTC Calculation 437    EKG Calculated P Axis 69    EKG Calculated R Axis -33    EKG Calculated T Axis 57    QTC Fredericia 405    Narrative    NORMAL SINUS RHYTHM  POSSIBLE LEFT ATRIAL ENLARGEMENT   LEFT AXIS DEVIATION  LEFT VENTRICULAR HYPERTROPHY ( R in aVL , [employer] product )  SEPTAL INFARCT  (CITED ON OR BEFORE 12-Nov-2022)  ABNORMAL ECG  WHEN COMPARED WITH ECG OF 12-Nov-2022 04:05,  T WAVE INVERSION LESS EVIDENT IN LATERAL LEADS  Confirmed by Darrel Baroni ([id number]) on 08/09/2023 8:46:47 PM           RADIOLOGY STUDIES ORDERED:  Ordered and Personally Reviewed by me:    XR Chest 2 views   Final Result   Diffuse bilateral interstitial prominence, which could be seen in the setting of atypical infection or edema.       Signed (Electronic Signature): 08/09/2023 9:58 PM    Signed By: Anselm Scripture, MD            EMERGENCY DEPARTMENT LABORATORY STUDIES:  Ordered and Personally Reviewed by me:    Labs Reviewed   COMPREHENSIVE METABOLIC PANEL - Abnormal; Notable for the following components:       Result Value    CO2 31.1 (*)     Total Protein 9.6 (*)     AST 7 (*)     All other components within normal limits   CBC W/ AUTO DIFF - Abnormal; Notable for the following components:    WBC 17.9 (*)     RDW 16.0 (*)     Absolute Neutrophils 11.6 (*)     Absolute Lymphocytes 4.5 (*)     Absolute Monocytes 1.6 (*)     Absolute Basophils 0.2 (*)     All other components within normal limits   RESPIRATORY PATHOGEN PANEL - Normal    Narrative:     This result was obtained using the Emergency Use Authorization (EUA) [employer] Film[employer] Respiratory Panel 2.1 (RP2.1), a qualitative multiplexed nucleic acid test. Performance characteristics have been verified by the [facility] [employer]. Positive results do not rule out co-infection with organisms not included in the RP2.1 panel. This assay does not distinguish between rhinovirus and enterovirus. Cross-reactivity may occur between B. pertussis and non-pertussis Bordetella species. All presumptive positive B. pertussis results will be automatically confirmed by a Bordetella pertussis specific assay. RP2.1 should not be used if B. pertussis infection is specifically suspected; a B. pertussis-specific test that is FDA-cleared for use  on patients suspected of having a respiratory tract infection should be ordered instead.                                    This test has not been FDA cleared or approved, and has been authorized by FDA under an Emergency Use Authorization (EUA). This test is only authorized for the duration of time the declaration that circumstances exist justifying the authorization of the emergency use of in vitro diagnostic tests for detection of SARS-CoV-2 virus and/or diagnosis of COVID-19 infection under section 564(b)(1) of the Act, 21 U.S.C. 639aaa-6(a)(8), unless the authorization is terminated or revoked sooner.                                    For Providers: [email or web address]-smith.[email or web address]                  For Patients: [email or web address]   HIGH SENSITIVITY TROPONIN I - SINGLE - Normal   PRO-BNP - Normal   CBC W/ DIFFERENTIAL    Narrative:     The following orders were created for panel order CBC w/ Differential.                  Procedure                               Abnormality         Status                                     ---------                               -----------         ------                                     CBC w/ [id number]]         Abnormal            Final result                                                 Please view results for these tests on the individual orders.         MEDICATIONS ADMINISTERED WHILE IN THE EMERGENCY DEPARTMENT:    Medications   cefdinir  (OMNICEF ) capsule 600 mg (has no administration in time range)   azithromycin  (ZITHROMAX ) tablet 500 mg (has no administration in time range)         ED COURSE and MEDICAL DECISION MAKING      Additional independent historians required for this encounter:     No additional historians utilized.          Differential diagnosis included, but were not limited to: Cough due to virus.  Chronic oropharyngeal dysphagia.  Aspiration pneumonitis.  Pneumonia.  Congestive heart disease..      **Medical records, tests, and treatments from other /  outside providers have been reviewed and pertinent findings included: ** see above tests / treatments reviewed documented after chief complaint and prior to description of HPI **      I have independently interpreted the radiographic images performed during this Emergency Department visit.  I agree with the radiologist's interpretation and findings of :    XR Chest 2 views   Final Result   Diffuse bilateral interstitial prominence, which could be seen in the setting of atypical infection or edema.       Signed (Electronic Signature): 08/09/2023 9:58 PM    Signed By: Anselm Scripture, MD            Temp:  [36.7 ??C (98 ??F)] 36.7 ??C (98 ??F)  Pulse:  [90-93] 90  SpO2 Pulse:  [90] 90  BP: (110-129)/(76-83) 110/83  MAP (mmHg):  [86-93] 93     Documented vital sign range during the most recent four hours of Emergency Department Treatment:        CLINICAL SCORING TOOLS USED:      Pneumonia severity index score/port score 85 this is class III with potential outpatient management options available.  She looks well she is having no respiratory distress room air saturations while dozing with a pickwickian body habitus is 92% she is not drop below this with sleep when awake she is 96%.  She has a mild cough we will treat with oral antibiotics and to believe and treat as an outpatient she is at a care facility they can watch her closely.  White count is noted she is afebrile.  I do not believe requires inpatient hospitalization at this time she has bilateral atypical infiltrates without history for congestive heart disease.  I do not believe this is heart failure and does not need diuresis.    EMERGENCY DEPARTMENT PROCEDURE(S) PERFORMED:        REPEAT BEDSIDE ASSESSMENTS and SIGNIFICANT EVENTS        MEDICAL DECISION MAKING:        Kimberly Mathis presents for evaluation of cough.  Productive.  No hypoxemia.  Pneumonia severity index score/port score 85 this is class III with potential outpatient management options available.  She looks well she is having no respiratory distress room air saturations while dozing with a pickwickian body habitus is 92% she is not drop below this with sleep when awake she is 96%.  She has a mild cough we will treat with oral antibiotics and to believe and treat as an outpatient she is at a care facility they can watch her closely.  White count is noted she is afebrile.  I do not believe requires inpatient hospitalization at this time she has bilateral atypical infiltrates without history for congestive heart disease.  I do not believe this is heart failure and does not need diuresis.  The patient continues long-term anticoagulation and risk of pulm embolism no signs recurrence of this process at this time.    Comorbid Conditions contributing to the complexity of the Medical Decision Making include:     Past Medical History[6]    I discussed the management and test results with the following consultants and / or health care providers prior to admission or discharge:      no extraneous management discussions were clinically indicated for this encounter.      Disposition decision making: Admission, additional observation, and additional testing was considered for this patient.  Considering the current clinical findings and clinical course while in our Department, I have recommended  discharge with treatment for commune acquired pneumonia complete a 5-day course of cefdinir  and azithromycin .  Return for worsening.    At the time of discharge, I reviewed with the patient the results from ED diagnostic testing, and I counseled the patient about the need for follow-up with the appropriate outpatient physician. Pt was made aware that ED evaluation is not a substitute for ongoing outpatient care. Patient counseled that if there is any change in condition or worsening of symptoms that patient is to return to the ED; return precautions and anticipatory guidance given. The patient expressed understanding of these instructions. Patient was discharged to home in stable condition.         FINAL IMPRESSION    1.  Airspace disease concerning for atypical pneumonia.  PSI score 85  2.  Schizoaffective disorder.  3.  CML.          FOLLOW-UP  Arloa Richerd Helling, [place]  [address]  [place] [address]  [phone]    Go in 1 week  As needed, If symptoms worsen      DISCHARGE MEDICATIONS    Home Medications have been reviewed.  Will maintain current home medication regiment,  with any additional medication(s) prescribed below:    New Prescriptions    AZITHROMYCIN  (ZITHROMAX  Z-PAK) 250 MG TABLET    Two tabs by mouth day #1.  1 tablet by mouth day 2 through 5.    CEFDINIR  (OMNICEF ) 300 MG CAPSULE    Take 2 capsules (600 mg total) by mouth daily for 5 days.       Prior to any controlled substances being prescribed for this patient, the Prescription Monitoring Database (PDMP) was reviewed.  The patient was counseled on potential side effects of these prescription medications.  Alternate, non-prescription medications were discussed as primary therapies to be considered prior to the use of any prescription medication(s).         [1]   Past Medical History:  Diagnosis Date    GERD (gastroesophageal reflux disease)     Gout     HTN (hypertension)     Morbid obesity (CMS-HCC)     Osteoporosis Schizoaffective disorder, bipolar type        Type II diabetes mellitus       [2] No past surgical history on file.  [3] No Known Allergies  [4] History reviewed. No pertinent family history.  [5]   Social History  Socioeconomic History    Marital status: Single     Spouse name: None    Number of children: None    Years of education: None    Highest education level: None   Tobacco Use    Smoking status: Former     Current packs/day: 1.00     Types: Cigarettes    Smokeless tobacco: Never   Vaping Use    Vaping status: Never Used   Substance and Sexual Activity    Alcohol use: Not Currently    Drug use: Not Currently     Social Drivers of Health     Financial Resource Strain: Low Risk  (04/01/2018)    Overall Financial Resource Strain (CARDIA)     Difficulty of Paying Living Expenses: Not very hard   Food Insecurity: No Food Insecurity (04/01/2018)    Hunger Vital Sign     Worried About Running Out of Food in the Last Year: Never true     Ran Out of Food in  the Last Year: Never true   Transportation Needs: No Transportation Needs (04/01/2018)    PRAPARE - Therapist, art (Medical): No     Lack of Transportation (Non-Medical): No   Physical Activity: Unknown (04/01/2018)    Exercise Vital Sign     Days of Exercise per Week: Patient declined     Minutes of Exercise per Session: Patient declined   Social Connections: Unknown (04/01/2018)    Social Connection and Isolation Panel     Frequency of Communication with Friends and Family: Patient declined     Frequency of Social Gatherings with Friends and Family: Patient declined     Attends Religious Services: Patient declined     Database administrator or Organizations: Patient declined     Attends Banker Meetings: Patient declined     Marital Status: Patient declined   Housing: Unknown (04/10/2020)    Housing     Within the past 12 months, have you ever stayed: outside, in a car, in a tent, in an overnight shelter, or temporarily in someone else's home (i.e. couch-surfing)?: Patient refused   [6]   Past Medical History:  Diagnosis Date    GERD (gastroesophageal reflux disease)     Gout     HTN (hypertension)     Morbid obesity (CMS-HCC)     Osteoporosis     Schizoaffective disorder, bipolar type        Type II diabetes mellitus            Nellene Ozell Agent, MD  08/09/23 [id number]

## 2023-08-09 NOTE — Unmapped (Signed)
 Bed: 16  Expected date:   Expected time:   Means of arrival:   Comments:  EMS- 58 y/o female, SOB, increased oxygen demand.

## 2023-08-10 ENCOUNTER — Emergency Department
Admit: 2023-08-10 | Discharge: 2023-08-10 | Disposition: A | Discharge status: Home / Self Care | Payer: Medicare (Managed Care) | Attending: Emergency Medicine

## 2023-08-10 MED ADMIN — cefdinir (OMNICEF) capsule 600 mg: 600 mg | ORAL | @ 03:00:00 | Stop: 2023-08-09

## 2023-08-10 MED ADMIN — azithromycin (ZITHROMAX) tablet 500 mg: 500 mg | ORAL | @ 03:00:00 | Stop: 2023-08-09

## 2023-08-17 DIAGNOSIS — C9210 Chronic myeloid leukemia, BCR/ABL-positive, not having achieved remission: Principal | ICD-10-CM

## 2023-08-17 NOTE — Unmapped (Unsigned)
 z                [facility] Follow-up    Patient Name: Kimberly Mathis  Patient Age: 58 y.o.  Encounter Date: 08/21/2023    Primary Care Provider:  Arloa Richerd Helling, PA    Referring Physician:  Freddrick, None Per Patient  [address] [place],  [place] [address]    Reason for visit:   F/u visit for [facility]    Assessment:  Ms. Kimberly Mathis is a 58 y.o. 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 She began treatment on imatinib  in 12/2015 with optimal response and meeting appropriate treatment milestones, except her one year PCR, which was slightly above goal.  She has been in a MMR and MR4.0 since 05/2018 until Jan 2021 when she lost MMR d/t caregiver change and missing medication. She was able to achieve MMR again until Oct 2022. She has had stable transcripts since Dec 2022, until her most recent level in June 2023 when it was discovered that she was not receiving her medication daily at her home. Her level did improve to a more normal state, however despite her facility giving her the medication daily, per the MAR, her BCR-ABL transcripts stayed elevated at  0.551 %. A BCR-ABL mutational panel was drawn which did not reveal the presence of any new mutations. Therefore the decision was made to switch her to Bosutinib 400 mg. She presents today approximately 1 year after starting the medication for repeat labs and an APP visit.     Ms. Brindisi presents to clinic today alone, without a caregiver or aid.  Her left lower leg pain is stable, but otherwise feels well with no complaints. Otherwise denies any new shortness of breath or increased side effects from her new medication. Ms. Venning brought with her just a medication list without a full MAR but Bosutinib listed as a regular medication. . Attempted to draw labs in clinic today however was unsuccessful due to poor venous access. Patient declined further lab draws with the lab or with the venous access team. Explained to patient that we can defer labs today, but I will need to assess her CBC and BCR-ABL sometime within the next few weeks. Will coordinate with her facility to bring her back for a lab only visit in 1-2 week. Will reach out with any deviations from her stable levels. She should continue Bosutinib 400 mg daily. If lab work in 2 weeks looks good recommend follow up in 4 months.     Our team will contact her facility that she needs a CNA or Aid to present with her at all times for safety reasons. Although I continue to request this, she has still presented to every appointment alone over the past few months. Will make the patients guardian,  [name] aware of this, as it continues to be a safety concern.  I will strongly advise and insist that patient come to all of her appointments in the future with a caregiver or aid from her home as well as a full MAR from the past 3 months.    History of PE:  She had an admission back in April 2024 for a lethargy and shortness of breath, and was diagnosed with  pulmonary emboli within subsegmental and segmental branches to the bilateral upper and lower lobes. She was discharged on 4/1 and has been stable on Eliquis   5 mg twice a day. She has a new patient appointment with Benign Hematology in October to determine duration.   - Continue Eliquis  5 mg twice a day    Recent ED visit for pneumonia: Breathing and O2 stats improved with oral antibiotics.     Nausea: She is feeling nauseated today: provided 8 mg of Zofran  in clinic with good relief.       Plan and Recommendations:  - Continue Bosutinib 400 mg daily  - Lab appointment in the basement for US  guidance in 1-2 weeks for CBC, CMP, and BCR-ABL  - Discuss with facility to ensure that patient has an aid sent with her when coming to these appointments  - Will reach out to patient's guardian with updates and to inform her that the patient continues to come without an aid  - Recommended full and updated MAR from the past 3 months when patient comes to appointments   - Continue Eliquis  5 mg twice daily for hx of PE indefinitely due to risk factors   - RTC in 4 months pending stable lab work       Dr. Carolan was available    [name] PA-C  Physician Assistant   Hematology/Oncology Division  [facility]  08/21/2023    I personally spent 40 minutes face-to-face and non-face-to-face in the care of this patient, which includes all pre, intra, and post visit time on the date of service.    History of Present Illness:  Hematology/Oncology History Overview Note   Diagnosis: CML    SOKAL Score:    Presentation: asymptomatic; leukocytosis found on routine CBC    Presenting WBC Count: 17,400    Bone Marrow Biopsy: not performed as patient unable to tolerate procedure.    Cytogenetics:  Abnormal FISH: An interphase FISH assay shows an abnormal signal pattern consistent with BCR/ABL1 fusion in 44% of the 100 cells scored. Of note, these cells contain an atypical abnormal signal pattern consistent with BCR/ABL1 rearrangement and loss of the ABL1/ASS1 region.    Treatment:  Imatinib  400 mg daily- 12/31/2015    12/31/15:  BCR-ABL1 p210 transcripts were detected at a level of 9.511 IS% ratio in blood.  BCR-ABL1 p190 transcripts were detected at a level of 3 in 100,000 cells in blood.    03/23/16:  BCR-ABL1 p210 transcripts were detected at a level of 0.702 IS % ratio in blood.    Normal FISH:  A BCR/ABL1 interphase FISH assay for the previously documented 9;22 translocation shows a normal signal pattern in 100% of the 200 nuclei scored     06/15/16:  BCR-ABL1 p210 transcripts were detected at a level of 0.272 IS % ratio in blood.    09/2016:  0.298 IS %    12/18: 0.241 IS%    03/22/17: 0.072%  06/21/17: 0.052%  09/27/17: 0.125%  01/03/18: 0.040%  05/28/18: 0.008%  09/13/18: 0.003%  01/14/19: 0.181%  02/05/19: 0.149%  04/10/19: 0.037%  09/03/19: 0.040%  12/04/19: 0.004%  03/04/20: 0.032%  06/30/20: 0.018%  10/07/20: 0.307%  12/23/20: 0.351%  06/02/21: 3.311%  09/07/2021:  0.887%  12/07/2021: 2.530%  03/14/2022: 0. 496%  06/13/2022: 0.551%    07/28/2022: Started Bosutinib 400 mg (was being given Imatinib  400 mg as well per error from her facility)    08/14/2022: STOPPED Imatinib  400 mg (given instructions to continue Bosutinib 400 mg )    09/21/2022: 0.023%    12/18/2022: 0.008%    05/22/2023: BCR-ABL: negative  CML (chronic myelocytic leukemia)       12/28/2015 Initial Diagnosis    CML (chronic myelocytic leukemia) (RAF-HCC)         Interval History:  Ms. Rita presents to clinic today alone without an aid. She reports no worsening pain today, leg pain is stable at her baseline.  Denies any fevers, constipation, or diarrhea. Reports good energy recently. Denies any shortness of breath, feels her breathing has been okay.  She denies any n/v/d. Denies any new cough. Energy is stable. History difficult to obtain as patient presents alone without an aid or caregiver and she became quite upset when told she needed to stay without an aid     Past Medical, Surgical and Family History were reviewed and pertinent updates were made in the Electronic Medical Record    Review of Systems:  All other systems reviewed were negative.     ECOG Performance Status: 2    Past Medical History:  Past Medical History:   Diagnosis Date    GERD (gastroesophageal reflux disease)     Gout     HTN (hypertension)     Morbid obesity (CMS-HCC)     Osteoporosis     Schizoaffective disorder, bipolar type         Type II diabetes mellitus            Medications:  Current Outpatient Medications   Medication Sig Dispense Refill    acetaminophen  (TYLENOL ) 325 MG tablet Take 1 tablet (325 mg total) by mouth every six (6) hours as needed for fever or pain.      allopurinoL  (ZYLOPRIM ) 100 MG tablet Take 1 tablet (100 mg total) by mouth daily.      apixaban  (ELIQUIS ) 5 mg Tab Take 1 tablet (5 mg total) by mouth two (2) times a day. bosutinib (BOSULIF ) 400 mg Tab Take 1 tablet (400 mg) by mouth daily. Administer with food. Swallow tablet whole; do not cut, crush, break, or chew. 30 tablet 5    clozapine  (CLOZARIL ) 100 MG tablet Take 3 tablets (300 mg total) by mouth nightly.      diclofenac sodium (VOLTAREN) 1 % gel Apply 1 g topically every twelve (12) hours as needed for arthritis (apply to knees as needed).      famotidine  (PEPCID ) 20 MG tablet Take 1 tablet (20 mg total) by mouth two (2) times a day.      hydrALAZINE (APRESOLINE) 10 MG tablet Take 1 tablet (10 mg total) by mouth Three (3) times a day.      melatonin 5 mg cap Take 1 capsule by mouth nightly.      metFORMIN  (GLUCOPHAGE ) 500 MG tablet Take 1 tablet (500 mg total) by mouth in the morning and 1 tablet (500 mg total) in the evening. Take with meals.  .      metoPROLOL  succinate (TOPROL -XL) 25 MG 24 hr tablet Take 1 tablet (25 mg total) by mouth two (2) times a day.      multivitamin (TAB-A-VITE/THERAGRAN) per tablet Take 1 capsule by mouth daily.      ondansetron  (ZOFRAN ) 4 MG tablet Take 1 tablet (4 mg total) by mouth every eight (8) hours as needed for nausea.      pravastatin  (PRAVACHOL ) 20 MG tablet Take 1 tablet (20 mg total) by mouth at bedtime.      QUEtiapine  (SEROQUEL ) 50 MG tablet Take 1 tablet (50 mg total) by mouth nightly.      saliva stimulant comb. no.3 (BIOTENE MOISTURIZING  MOUTH) Spry 1 spray every two (2) hours as needed.      sertraline  (ZOLOFT ) 50 MG tablet Take 1 tablet (50 mg total) by mouth daily.      valproic  acid (DEPAKENE ) 250 mg capsule Take 4 capsules (1,000 mg total) by mouth in the morning and 4 capsules (1,000 mg total) in the evening.       Current Facility-Administered Medications   Medication Dose Route Frequency Provider Last Rate Last Admin    ondansetron  (ZOFRAN ) 8 MG tablet             ondansetron  (ZOFRAN ) tablet 8 mg  8 mg Oral Q8H PRN Letha Corean Rattan, PA   8 mg at 08/21/23 1006     Vital Signs:  BSA: There is no height or weight on file to calculate BSA.  Vitals:    08/21/23 0936   BP: 138/81   Pulse: 91   Resp: 16   Temp: 36.6 ??C (97.9 ??F)   SpO2: 95%           Physical Exam:  Constitutional: Obese woman sitting in wheelchair, no acute distress  Eyes: PERRL. No scleral icterus or conjunctival injection.  Cardiovascular:  Regular rate, regular rhythm  S1, S2.  No murmurs, gallops or rubs.   Respiratory:  Breathing is unlabored.  CTAB. No rales, ronchi or crackles.  Breath sounds mildly diminished throughout all lung fields.   GI:  No distention or pain on palpation.  Bowel sounds are present.  .  Musculoskeletal:   strength equal bilaterally. No swelling noted in the LE.   Skin:  No rashes, petechiae or purpura.  grossly intact.   Neurologic:  alert and oriented to person/place, forced speech  Psychiatric: tangential requiring frequent re-direction    Relevant Laboratory, radiology and pathology results:  Labs not drawn today, will check CBC. CMP, and BCR-ABL in 1-2 weeks 08/09/2023 0.3  0.3 - 1.2 mg/dL Final    AST 91/92/7974 7 (L)  13 - 40 U/L Final    ALT 08/09/2023 26  7 - 40 U/L Final    Alkaline Phosphatase 08/09/2023 67  46 - 116 U/L Final    hsTroponin I 08/09/2023 6  <=34 ng/L Final    PRO-BNP 08/09/2023 79.0  0.0 - 125.0 pg/mL Final    WBC 08/09/2023 17.9 (H)  3.6 - 11.2 10*9/L Final    RBC 08/09/2023 4.09  3.95 - 5.13 10*12/L Final    HGB 08/09/2023 11.4  11.3 - 14.9 g/dL Final    HCT 91/92/7974 35.1  34.0 - 44.0 % Final    MCV 08/09/2023 85.9  77.6 - 95.7 fL Final    MCH 08/09/2023 28.0  25.9 - 32.4 pg Final    MCHC 08/09/2023 32.6  32.0 - 36.0 g/dL Final    RDW 91/92/7974 16.0 (H)  12.2 - 15.2 % Final    MPV 08/09/2023 7.8  6.8 - 10.7 fL Final    Platelet 08/09/2023 438  150 - 450 10*9/L Final    Neutrophils % 08/09/2023 64.9  % Final    Lymphocytes % 08/09/2023 25.0  % Final    Monocytes % 08/09/2023 9.1  % Final    Eosinophils % 08/09/2023 0.2  % Final    Basophils % 08/09/2023 0.8  % Final    Absolute Neutrophils 08/09/2023 11.6 (H)  1.8 - 7.8 10*9/L Final    Absolute Lymphocytes 08/09/2023 4.5 (H)  1.1 - 3.6 10*9/L Final    Absolute Monocytes 08/09/2023 1.6 (H)  0.3 - 0.8 10*9/L Final    Absolute Eosinophils 08/09/2023 0.0  0.0 - 0.5 10*9/L Final    Absolute Basophils 08/09/2023 0.2 (H)  0.0 - 0.1 10*9/L Final    Adenovirus 08/09/2023 Not Detected  Not Detected Final    Coronavirus HKU1 08/09/2023 Not Detected  Not Detected Final    Coronavirus NL63 08/09/2023 Not Detected  Not Detected Final    Coronavirus 229E 08/09/2023 Not Detected  Not Detected Final    Coronavirus OC43 PCR 08/09/2023 Not Detected  Not Detected Final    Metapneumovirus 08/09/2023 Not Detected  Not Detected Final    Rhinovirus/Enterovirus 08/09/2023 Not Detected  Not Detected Final    Influenza A 08/09/2023 Not Detected  Not Detected Final    Influenza B 08/09/2023 Not Detected  Not Detected Final    Parainfluenza 1 08/09/2023 Not Detected  Not Detected Final    Parainfluenza 2 08/09/2023 Not Detected  Not Detected Final    Parainfluenza 3 08/09/2023 Not Detected  Not Detected Final    Parainfluenza 4 08/09/2023 Not Detected  Not Detected Final    RSV 08/09/2023 Not Detected  Not Detected Final    Bordetella pertussis 08/09/2023 Not Detected  Not Detected Final    If B. pertussis/parapertussis infection is suspected, the Bordetella pertussis/parapertussis Qualitative PCR test should be ordered.    Bordetella parapertussis 08/09/2023 Not Detected  Not Detected Final    Chlamydophila (Chlamydia) pneumoni* 08/09/2023 Not Detected  Not Detected Final    Mycoplasma pneumoniae 08/09/2023 Not Detected  Not Detected Final    SARS-CoV-2 PCR 08/09/2023 Not Detected  Not Detected Final

## 2023-08-21 ENCOUNTER — Ambulatory Visit: Admit: 2023-08-21 | Discharge: 2023-08-21 | Payer: Medicare (Managed Care)

## 2023-08-21 ENCOUNTER — Ambulatory Visit
Admit: 2023-08-21 | Discharge: 2023-08-21 | Payer: Medicare (Managed Care) | Attending: Student in an Organized Health Care Education/Training Program | Primary: Student in an Organized Health Care Education/Training Program

## 2023-08-21 DIAGNOSIS — R110 Nausea: Principal | ICD-10-CM

## 2023-08-21 DIAGNOSIS — C9210 Chronic myeloid leukemia, BCR/ABL-positive, not having achieved remission: Principal | ICD-10-CM

## 2023-08-21 MED ADMIN — ondansetron (ZOFRAN) tablet 8 mg: 8 mg | ORAL | @ 14:00:00 | Stop: 2023-08-21

## 2023-08-21 MED FILL — BOSULIF 400 MG TABLET: ORAL | 30 days supply | Qty: 30 | Fill #5

## 2023-08-21 NOTE — Unmapped (Addendum)
 CML: Well controlled, labs unable to be drawn today  - Please continue Bosutinib 400 mg daily for this patient indefinitely  -Return clinic appointment in 4 months  - Please send this patient here with an aid moving forward  - Will to return for a lab appointment labs next week in our lab with an US , labs unable to be drawn in clinic today       If you have questions or concerns, you may call the Nurse call line at [phone].    No visits with results within 1 Day(s) from this visit.   Latest known visit with results is:   Admission on 08/09/2023, Discharged on 08/10/2023   Component Date Value Ref Range Status    EKG Ventricular Rate 08/09/2023 95  BPM Final    EKG Atrial Rate 08/09/2023 95  BPM Final    EKG P-R Interval 08/09/2023 162  ms Final    EKG QRS Duration 08/09/2023 98  ms Final    EKG Q-T Interval 08/09/2023 348  ms Final    EKG QTC Calculation 08/09/2023 437  ms Final    EKG Calculated P Axis 08/09/2023 69  degrees Final    EKG Calculated R Axis 08/09/2023 -33  degrees Final    EKG Calculated T Axis 08/09/2023 57  degrees Final    QTC Fredericia 08/09/2023 405  ms Final    Sodium 08/09/2023 145  136 - 145 mmol/L Final    Potassium 08/09/2023 3.6  3.5 - 5.1 mmol/L Final    Chloride 08/09/2023 103  98 - 107 mmol/L Final    CO2 08/09/2023 31.1 (H)  20.0 - 31.0 mmol/L Final    Anion Gap 08/09/2023 11  3 - 11 mmol/L Final    BUN 08/09/2023 10  9 - 23 mg/dL Final    Creatinine 91/92/7974 0.67  0.50 - 0.80 mg/dL Final    BUN/Creatinine Ratio 08/09/2023 15   Final    eGFR CKD-EPI (2021) Female 08/09/2023 >90  >=60 mL/min/1.32m2 Final    eGFR calculated with CKD-EPI 2021 equation in accordance with [employer] and [employer] of Nephrology Task Force recommendations.    Glucose 08/09/2023 114  70 - 179 mg/dL Final    Calcium 91/92/7974 9.5  8.7 - 10.4 mg/dL Final    Albumin 91/92/7974 4.0  3.4 - 5.0 g/dL Final    Total Protein 08/09/2023 9.6 (H)  5.7 - 8.2 g/dL Final    Total Bilirubin 08/09/2023 0.3  0.3 - 1.2 mg/dL Final    AST 91/92/7974 7 (L)  13 - 40 U/L Final    ALT 08/09/2023 26  7 - 40 U/L Final    Alkaline Phosphatase 08/09/2023 67  46 - 116 U/L Final    hsTroponin I 08/09/2023 6  <=34 ng/L Final    PRO-BNP 08/09/2023 79.0  0.0 - 125.0 pg/mL Final    WBC 08/09/2023 17.9 (H)  3.6 - 11.2 10*9/L Final    RBC 08/09/2023 4.09  3.95 - 5.13 10*12/L Final    HGB 08/09/2023 11.4  11.3 - 14.9 g/dL Final    HCT 91/92/7974 35.1  34.0 - 44.0 % Final    MCV 08/09/2023 85.9  77.6 - 95.7 fL Final    MCH 08/09/2023 28.0  25.9 - 32.4 pg Final    MCHC 08/09/2023 32.6  32.0 - 36.0 g/dL Final    RDW 91/92/7974 16.0 (H)  12.2 - 15.2 % Final    MPV 08/09/2023 7.8  6.8 - 10.7  fL Final    Platelet 08/09/2023 438  150 - 450 10*9/L Final    Neutrophils % 08/09/2023 64.9  % Final    Lymphocytes % 08/09/2023 25.0  % Final    Monocytes % 08/09/2023 9.1  % Final    Eosinophils % 08/09/2023 0.2  % Final    Basophils % 08/09/2023 0.8  % Final    Absolute Neutrophils 08/09/2023 11.6 (H)  1.8 - 7.8 10*9/L Final    Absolute Lymphocytes 08/09/2023 4.5 (H)  1.1 - 3.6 10*9/L Final    Absolute Monocytes 08/09/2023 1.6 (H)  0.3 - 0.8 10*9/L Final    Absolute Eosinophils 08/09/2023 0.0  0.0 - 0.5 10*9/L Final    Absolute Basophils 08/09/2023 0.2 (H)  0.0 - 0.1 10*9/L Final    Adenovirus 08/09/2023 Not Detected  Not Detected Final    Coronavirus HKU1 08/09/2023 Not Detected  Not Detected Final    Coronavirus NL63 08/09/2023 Not Detected  Not Detected Final    Coronavirus 229E 08/09/2023 Not Detected  Not Detected Final    Coronavirus OC43 PCR 08/09/2023 Not Detected  Not Detected Final    Metapneumovirus 08/09/2023 Not Detected  Not Detected Final    Rhinovirus/Enterovirus 08/09/2023 Not Detected  Not Detected Final    Influenza A 08/09/2023 Not Detected  Not Detected Final    Influenza B 08/09/2023 Not Detected  Not Detected Final    Parainfluenza 1 08/09/2023 Not Detected  Not Detected Final    Parainfluenza 2 08/09/2023 Not Detected  Not Detected Final    Parainfluenza 3 08/09/2023 Not Detected  Not Detected Final    Parainfluenza 4 08/09/2023 Not Detected  Not Detected Final    RSV 08/09/2023 Not Detected  Not Detected Final    Bordetella pertussis 08/09/2023 Not Detected  Not Detected Final    If B. pertussis/parapertussis infection is suspected, the Bordetella pertussis/parapertussis Qualitative PCR test should be ordered.    Bordetella parapertussis 08/09/2023 Not Detected  Not Detected Final    Chlamydophila (Chlamydia) pneumoni* 08/09/2023 Not Detected  Not Detected Final    Mycoplasma pneumoniae 08/09/2023 Not Detected  Not Detected Final    SARS-CoV-2 PCR 08/09/2023 Not Detected  Not Detected Final

## 2023-08-21 NOTE — Unmapped (Signed)
 Patient came in today for lab draw prior to clinic visit with CANDIE Cart, PA. Nurses attempted a total of four times to obtain blood for lab work without success. Ms. Cart notified and went to see patient. Plan to reschedule patient for lab work within the next week.

## 2023-08-24 DIAGNOSIS — C9210 Chronic myeloid leukemia, BCR/ABL-positive, not having achieved remission: Principal | ICD-10-CM

## 2023-08-24 NOTE — Unmapped (Signed)
 Facility of Patient Kimberly Mathis was contacted today regarding scheduling for Labs in 2 weeks and 4 month Labs and Follow Up with Stephanie Dawson. Voicemail was left for patient with information to call back. Will make another attempt on Monday, 8/25.     - Gaylan JINNY Novak

## 2023-08-27 DIAGNOSIS — C9210 Chronic myeloid leukemia, BCR/ABL-positive, not having achieved remission: Principal | ICD-10-CM

## 2023-08-27 NOTE — Unmapped (Signed)
 Facility on behalf of Patient Kimberly Mathis was contacted today regarding scheduling for Labs in 2 weeks and Labs and Return Visit with [name] in 4 months. Voicemail was left for patient with information to call back. Ok to schedule upon return call.     - Faith JINNY Novak

## 2023-08-29 NOTE — Unmapped (Signed)
 This pharmacist was notified by a technician that this patient has started a new medication amoxicillin  for an infection (reported back on 08/08/23).. I have reviewed the patient's medical record and have determined that there are no concerns for drug interactions with their new medication. The patients medication list has been updated, if appropriate, and no further action is required.  She may  have completed or will soon complete the course.      Approximate time spent: 0-5 minutes    Aneita DELENA Merck, [facility], Clinical Specialty Pharmacist  [facility] Delivery Pharm[facility]

## 2023-08-29 NOTE — Unmapped (Signed)
 Addended by: Ardean Simonich A on: 08/29/2023 09:03 AM     Modules accepted: Orders

## 2023-09-04 DIAGNOSIS — C9210 Chronic myeloid leukemia, BCR/ABL-positive, not having achieved remission: Principal | ICD-10-CM

## 2023-09-11 DIAGNOSIS — C9210 Chronic myeloid leukemia, BCR/ABL-positive, not having achieved remission: Principal | ICD-10-CM

## 2023-09-11 MED ORDER — BOSULIF 400 MG TABLET
ORAL_TABLET | Freq: Every day | ORAL | 5 refills | 30.00000 days
Start: 2023-09-11 — End: ?

## 2023-09-14 DIAGNOSIS — C9210 Chronic myeloid leukemia, BCR/ABL-positive, not having achieved remission: Principal | ICD-10-CM

## 2023-09-14 MED ORDER — BOSULIF 400 MG TABLET
ORAL_TABLET | Freq: Every day | ORAL | 5 refills | 30.00000 days | Status: CP
Start: 2023-09-14 — End: ?
  Filled 2023-09-20: qty 30, 30d supply, fill #0

## 2023-09-14 NOTE — Unmapped (Signed)
 Facility on behalf of Patient Kimberly Mathis was contacted today regarding scheduling patient for a lab appointment per Letha. Voicemail was left for patient with information to call back.  Okay to schedule if they call back.     - Faith JINNY Novak

## 2023-09-14 NOTE — Unmapped (Addendum)
 [facility] Other Call ONC Phone Room Smart Lists: General -     Hi,     Katelynn w/ [facility] contacted the Communication Center regarding the following:    - Declined lab appt and requested for labs orders to be faxed to to [phone]    Please contact Katelynn at [phone].    Thanks in advance,    Davied ONEIDA Filter  [facility] Communication Center   [phone]    [facility]

## 2023-09-18 NOTE — Unmapped (Signed)
 The [facility] has made a second and final attempt to reach this patient to refill the following medication:Bosulif  400mg .      We have left voicemail with Receptionist at the following phone numbers: [phone].    Dates contacted: 9/10,16  Last scheduled delivery: 08/21/23    The patient may be at risk of non-compliance with this medication. The patient should call the [facility] at [phone]  Option 4, then Option 1: Oncology to refill medication.    Maryla CHRISTELLA Koleen Irven [facility] Specialty Technician

## 2023-09-18 NOTE — Unmapped (Signed)
 [facility] Delivery [facility] Refill Coordination Note    Specialty Medication(s) to be Shipped:   Hematology/Oncology: Bosulif     Other medication(s) to be shipped: No additional medications requested for fill at this time    Specialty Medications not needed at this time: N/A     Kimberly Mathis, DOB: [birth year]  Phone: [phone] (home)       All above HIPAA information was verified with patient's caregiver, Kimberly Mathis UNIT Manager at [employer]     Was a Nurse, learning disability used for this call? No    Completed refill call assessment today to schedule patient's medication shipment from the [facility]  [phone]).  All relevant notes have been reviewed.     Specialty medication(s) and dose(s) confirmed: Regimen is correct and unchanged.   Changes to medications: Kimberly Mathis reports no changes at this time.  Changes to insurance: No  New side effects reported not previously addressed with a pharmacist or physician: None reported  Questions for the pharmacist: No    Confirmed patient received a Conservation officer, historic buildings and a Surveyor, mining with first shipment. The patient will receive a drug information handout for each medication shipped and additional FDA Medication Guides as required.       DISEASE/MEDICATION-SPECIFIC INFORMATION        N/A    SPECIALTY MEDICATION ADHERENCE     Medication Adherence    Patient reported X missed doses in the last month: 0  Specialty Medication: Bosulif  400 mg  Patient is on additional specialty medications: No  Informant: patient  Support network for adherence: home health agency              Were doses missed due to medication being on hold? No        BOSULIF  400 mg Tab (bosutinib): 4 days of medicine on hand     REFERRAL TO PHARMACIST     Referral to the pharmacist: Not needed      [facility]     Shipping address confirmed in Epic.     Cost and Payment: Patient has a $0 copay, payment information is not required.    Delivery Scheduled: Yes, Expected medication delivery date: 9.19.25.     Medication will be delivered via Next Day Courier to the prescription address in Epic WAM.    Kimberly Mathis   [facility] Special[facility] and Home Delivery Pharm[facility]  Specialty Technician

## 2023-09-28 DIAGNOSIS — C9210 Chronic myeloid leukemia, BCR/ABL-positive, not having achieved remission: Principal | ICD-10-CM

## 2023-09-28 NOTE — Unmapped (Signed)
 I spoke with Katelynn at facility on behalf of patient Kimberly Mathis to confirm appointments on the following date(s): 10/02/23 Labs only, 12/18/23 Labs and Return Visit with [name].     Faith JINNY Novak

## 2023-10-03 ENCOUNTER — Other Ambulatory Visit: Admit: 2023-10-03 | Discharge: 2023-10-04 | Payer: Medicare (Managed Care)

## 2023-10-03 DIAGNOSIS — C9210 Chronic myeloid leukemia, BCR/ABL-positive, not having achieved remission: Principal | ICD-10-CM

## 2023-10-03 LAB — COMPREHENSIVE METABOLIC PANEL
ALBUMIN: 4.3 g/dL (ref 3.4–5.0)
ALKALINE PHOSPHATASE: 76 U/L (ref 46–116)
ALT (SGPT): 19 U/L (ref 10–49)
ANION GAP: 13 mmol/L (ref 5–14)
AST (SGOT): 18 U/L (ref <=34)
BILIRUBIN TOTAL: 0.2 mg/dL — ABNORMAL LOW (ref 0.3–1.2)
BLOOD UREA NITROGEN: 7 mg/dL — ABNORMAL LOW (ref 9–23)
BUN / CREAT RATIO: 11
CALCIUM: 10.3 mg/dL (ref 8.7–10.4)
CHLORIDE: 99 mmol/L (ref 98–107)
CO2: 32 mmol/L — ABNORMAL HIGH (ref 20.0–31.0)
CREATININE: 0.61 mg/dL (ref 0.55–1.02)
EGFR CKD-EPI (2021) FEMALE: >90 mL/min/1.73m2 (ref >=60)
GLUCOSE RANDOM: 144 mg/dL (ref 70–179)
POTASSIUM: 3.9 mmol/L (ref 3.4–4.8)
PROTEIN TOTAL: 9 g/dL — ABNORMAL HIGH (ref 5.7–8.2)
SODIUM: 144 mmol/L (ref 135–145)

## 2023-10-03 LAB — CBC W/ AUTO DIFF
BASOPHILS ABSOLUTE COUNT: 0.1 10*9/L (ref 0.0–0.1)
BASOPHILS RELATIVE PERCENT: 0.6 %
EOSINOPHILS ABSOLUTE COUNT: 0.1 10*9/L (ref 0.0–0.5)
EOSINOPHILS RELATIVE PERCENT: 1 %
HEMATOCRIT: 34.3 % (ref 34.0–44.0)
HEMOGLOBIN: 11.2 g/dL — ABNORMAL LOW (ref 11.3–14.9)
LYMPHOCYTES ABSOLUTE COUNT: 3.2 10*9/L (ref 1.1–3.6)
LYMPHOCYTES RELATIVE PERCENT: 31.3 %
MEAN CORPUSCULAR HEMOGLOBIN CONC: 32.6 g/dL (ref 32.0–36.0)
MEAN CORPUSCULAR HEMOGLOBIN: 28.1 pg (ref 25.9–32.4)
MEAN CORPUSCULAR VOLUME: 86.1 fL (ref 77.6–95.7)
MEAN PLATELET VOLUME: 9 fL (ref 6.8–10.7)
MONOCYTES ABSOLUTE COUNT: 0.9 10*9/L — ABNORMAL HIGH (ref 0.3–0.8)
MONOCYTES RELATIVE PERCENT: 8.7 %
NEUTROPHILS ABSOLUTE COUNT: 6 10*9/L (ref 1.8–7.8)
NEUTROPHILS RELATIVE PERCENT: 58.4 %
PLATELET COUNT: 271 10*9/L (ref 150–450)
RED BLOOD CELL COUNT: 3.98 10*12/L (ref 3.95–5.13)
RED CELL DISTRIBUTION WIDTH: 16 % — ABNORMAL HIGH (ref 12.2–15.2)
WBC ADJUSTED: 10.3 10*9/L (ref 3.6–11.2)

## 2023-10-22 NOTE — Unmapped (Signed)
 The [facility] has made a second and final attempt to reach this patient to refill the following medication:Bosulif  400mg .      We have left voicemails on the following phone numbers: [phone], have left voicemail with Med Tech at the following phone numbers: [phone], and have sent a MyChart message.    Dates contacted: 10/14,20  Last scheduled delivery: 09/20/23    The patient may be at risk of non-compliance with this medication. The patient should call the [facility] at [phone]  Option 4, then Option 1: Oncology to refill medication.    Maryla CHRISTELLA [facility] Specialty Technician

## 2023-11-04 NOTE — Progress Notes (Signed)
 [facility] Refill Coordination Note    Specialty Medication(s) to be Shipped:   Hematology/Oncology: Bosulif     Other medication(s) to be shipped: No additional medications requested for fill at this time    Specialty Medications not needed at this time: N/A     Kimberly Mathis, DOB: 1965/10/02  Phone: [phone] (home)       All above HIPAA information was verified with patient's caregiver, Kimberly Mathis     Was a translator used for this call? No    Completed refill call assessment today to schedule patient's medication shipment from the [facility]  [phone]).  All relevant notes have been reviewed.     Specialty medication(s) and dose(s) confirmed: Regimen is correct and unchanged.   Changes to medications: Kimberly Mathis reports no changes at this time.  Changes to insurance: No  New side effects reported not previously addressed with a pharmacist or physician: None reported  Questions for the pharmacist: No    Confirmed patient received a Conservation Officer, Historic Buildings and a Surveyor, Mining with first shipment. The patient will receive a drug information handout for each medication shipped and additional FDA Medication Guides as required.       DISEASE/MEDICATION-SPECIFIC INFORMATION        N/A    SPECIALTY MEDICATION ADHERENCE     Medication Adherence    Patient reported X missed doses in the last month: 0  Specialty Medication: Bosulif  400mg  daily  Patient is on additional specialty medications: No  Patient is on more than two specialty medications: No  Any gaps in refill history greater than 2 weeks in the last 3 months: no  Demonstrates understanding of importance of adherence: yes  Informant: caregiver  Support network for adherence: home health agency              Were doses missed due to medication being on hold? No    Bosulif  400 mg: 0 doses of medicine on hand     REFERRAL TO PHARMACIST     Referral to the pharmacist: Not needed      SHIPPING     Shipping address confirmed in Epic.     Cost and Payment: Patient has a $0 copay, payment information is not required.    Delivery Scheduled: Yes, Expected medication delivery date: 11/05/23.     Medication will be delivered via Same Day Courier to the prescription address in Epic [place].    Kimberly Mathis, PharmD   [facility]  Specialty Pharmacist

## 2023-11-04 NOTE — Progress Notes (Signed)
 [facility] On Call Intervention    Ms.Ruegg's caregiver paged the On Call Specialty Pharmacist on 11/2 at 7:50p regarding their medication Hematology/Oncology: Bosulif .     Type of intervention: Missed dose question    Action taken: Evening SNF nurse states they gave last dose this morning and will need shipment asap. We scheuduled delivery and discussed to give dose as soon as they receive shipment tomorrow. If dose is missed beyond 12 hours, they will need to skip dose and resume normal schedule the following day. They state they will confirm with provider if they can change administration time for tomorrow since med will arrive after administration time of 8:00am. I also confirmed phone number is accurate for future outreach attempts.     Follow-up needed: n/a    Time spent: 10 minutes      This encounter has been routed to the patient's primary specialty pharmacist.      Shelba DELENA Hummer, PharmD  [facility] Delivery Pharm[facility] Pharmacist

## 2023-11-05 MED FILL — BOSULIF 400 MG TABLET: ORAL | 30 days supply | Qty: 30 | Fill #1

## 2023-12-04 NOTE — Progress Notes (Signed)
 [facility] Delivery Pharm[facility] Refill Coordination Note    Specialty Medication(s) to be Shipped:   Hematology/Oncology: Bosulif     Other medication(s) to be shipped: No additional medications requested for fill at this time    Specialty Medications not needed at this time: N/A     Kimberly Mathis, DOB: [birth year]  Phone: [phone] (home)       All above HIPAA information was verified with patient's caregiver, Kimberly Mathis     Was a translator used for this call? No    Completed refill call assessment today to schedule patient's medication shipment from the [facility]  [phone]).  All relevant notes have been reviewed.     Specialty medication(s) and dose(s) confirmed: Regimen is correct and unchanged.   Changes to medications: Kimberly Mathis reports no changes at this time.  Changes to insurance: No  New side effects reported not previously addressed with a pharmacist or physician: None reported  Questions for the pharmacist: No    Confirmed patient received a Conservation Officer, Historic Buildings and a Surveyor, Mining with first shipment. The patient will receive a drug information handout for each medication shipped and additional FDA Medication Guides as required.       DISEASE/MEDICATION-SPECIFIC INFORMATION        N/A    SPECIALTY MEDICATION ADHERENCE     Medication Adherence    Patient reported X missed doses in the last month: 0  Specialty Medication: Bosulif  400mg   Patient is on additional specialty medications: No  Informant: caregiver  Support network for adherence: home health agency     Were doses missed due to medication being on hold? No    Bosulif  400 mg: 3 days of medicine on hand       REFERRAL TO PHARMACIST     Referral to the pharmacist: Not needed      [facility]     Shipping address confirmed in Epic.     Cost and Payment: Patient has a $0 copay, payment information is not required.    Delivery Scheduled: Yes, Expected medication delivery date: 12/06/23.     Medication will be delivered via Next Day Courier to the prescription address in Epic [place].    Kimberly Mathis   [place] [facility]  Specialty Technician

## 2023-12-05 MED FILL — BOSULIF 400 MG TABLET: ORAL | 30 days supply | Qty: 30 | Fill #2

## 2023-12-14 DIAGNOSIS — C9210 Chronic myeloid leukemia, BCR/ABL-positive, not having achieved remission: Principal | ICD-10-CM

## 2023-12-14 NOTE — Progress Notes (Unsigned)
 z                [facility] Follow-up    Patient Name: Kimberly Mathis  Patient Age: 58 y.o.  Encounter Date: 12/18/2023    Primary Care Provider:  Arloa Richerd Helling, PA    Referring Physician:  Freddrick, None Per Patient  [address]  [place],  [place] [address]    Reason for visit:   F/u visit for Kimberly Mathis    Assessment:  Ms. [name] is a 58 y.o. 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 She began treatment on imatinib  in 12/2015 with optimal response and meeting appropriate treatment milestones, except her one year PCR, which was slightly above goal.  She has been in a MMR and MR4.0 since 05/2018 until Jan 2021 when she lost MMR d/t caregiver change and missing medication. She was able to achieve MMR again until Oct 2022. She has had stable transcripts since Dec 2022, until her most recent level in June 2023 when it was discovered that she was not receiving her medication daily at her home. Her level did improve to a more normal state, however despite her facility giving her the medication daily, per the MAR, her BCR-ABL transcripts stayed elevated at  0.551 %. A BCR-ABL mutational panel was drawn which did not reveal the presence of any new mutations. Therefore the decision was made to switch her to Bosutinib 400 mg. She presents today approximately 1 year after starting the medication for repeat labs and an APP visit.     Ms. Keach presents to clinic today alone, without a caregiver or aid.  Her left lower leg pain is stable, but otherwise feels well with no complaints. Otherwise denies any new shortness of breath or increased side effects from her new medication. Ms. Hendrie brought with her just a medication list without a full MAR but Bosutinib listed as a regular medication. . Attempted to draw labs in clinic today however was unsuccessful due to poor venous access. Patient declined further lab draws with the lab or with the venous access team. Explained to patient that we can defer labs today, but I will need to assess her CBC and BCR-ABL sometime within the next few weeks. Will coordinate with her facility to bring her back for a lab only visit in 1-2 week. Will reach out with any deviations from her stable levels. She should continue Bosutinib 400 mg daily. If lab work in 2 weeks looks good recommend follow up in 4 months.     Our team will contact her facility that she needs a CNA or Aid to present with her at all times for safety reasons. Although I continue to request this, she has still presented to every appointment alone over the past few months. Will make the patients guardian,  [name] aware of this, as it continues to be a safety concern.  I will strongly advise and insist that patient come to all of her appointments in the future with a caregiver or aid from her home as well as a full MAR from the past 3 months.    History of PE:  She had an admission back in April 2024 for a lethargy and shortness of breath, and was diagnosed with  pulmonary emboli within subsegmental and segmental branches to the bilateral upper and lower lobes. She was discharged on 4/1 and has been stable on Eliquis   5 mg twice a day. She has a new patient appointment with Benign Hematology in October to determine duration.   - Continue Eliquis  5 mg twice a day    Recent ED visit for pneumonia: Breathing and O2 stats improved with oral antibiotics.     Nausea: She is feeling nauseated today: provided 8 mg of Zofran  in clinic with good relief.       Plan and Recommendations:  - Continue Bosutinib 400 mg daily  - Lab appointment in the basement for US  guidance in 1-2 weeks for CBC, CMP, and BCR-ABL  - Discuss with facility to ensure that patient has an aid sent with her when coming to these appointments  - Will reach out to patient's guardian with updates and to inform her that the patient continues to come without an aid  - Recommended full and updated MAR from the past 3 months when patient comes to appointments   - Continue Eliquis  5 mg twice daily for hx of PE indefinitely due to risk factors   - RTC in 4 months pending stable lab work       Dr. Carolan was available    [name] PA-C  Physician Assistant   Hematology/Oncology Division  [facility]  12/18/2023    I personally spent 40 minutes face-to-face and non-face-to-face in the care of this patient, which includes all pre, intra, and post visit time on the date of service.    History of Present Illness:  Hematology/Oncology History Overview Note   Diagnosis: CML    SOKAL Score:    Presentation: asymptomatic; leukocytosis found on routine CBC    Presenting WBC Count: 17,400    Bone Marrow Biopsy: not performed as patient unable to tolerate procedure.    Cytogenetics:  Abnormal FISH: An interphase FISH assay shows an abnormal signal pattern consistent with BCR/ABL1 fusion in 44% of the 100 cells scored. Of note, these cells contain an atypical abnormal signal pattern consistent with BCR/ABL1 rearrangement and loss of the ABL1/ASS1 region.    Treatment:  Imatinib  400 mg daily- 12/31/2015    12/31/15:  BCR-ABL1 p210 transcripts were detected at a level of 9.511 IS% ratio in blood.  BCR-ABL1 p190 transcripts were detected at a level of 3 in 100,000 cells in blood.    03/23/16:  BCR-ABL1 p210 transcripts were detected at a level of 0.702 IS % ratio in blood.    Normal FISH:  A BCR/ABL1 interphase FISH assay for the previously documented 9;22 translocation shows a normal signal pattern in 100% of the 200 nuclei scored     06/15/16:  BCR-ABL1 p210 transcripts were detected at a level of 0.272 IS % ratio in blood.    09/2016:  0.298 IS %    12/18: 0.241 IS%    03/22/17: 0.072%  06/21/17: 0.052%  09/27/17: 0.125%  01/03/18: 0.040%  05/28/18: 0.008%  09/13/18: 0.003%  01/14/19: 0.181%  02/05/19: 0.149%  04/10/19: 0.037%  09/03/19: 0.040%  12/04/19: 0.004%  03/04/20: 0.032%  06/30/20: 0.018%  10/07/20: 0.307%  12/23/20: 0.351%  06/02/21: 3.311%  09/07/2021:  0.887%  12/07/2021: 2.530%  03/14/2022: 0. 496%  06/13/2022: 0.551%    07/28/2022: Started Bosutinib 400 mg (was being given Imatinib  400 mg as well per error from her facility)    08/14/2022: STOPPED Imatinib  400 mg (given instructions to continue Bosutinib 400 mg )    09/21/2022: 0.023%    12/18/2022: 0.008%    05/22/2023: BCR-ABL: negative  CML (chronic myelocytic leukemia) (CMS-HCC)   12/28/2015 Initial Diagnosis    CML (chronic myelocytic leukemia) (RAF-HCC)         Interval History:  Ms. Rotondo presents to clinic today alone without an aid. She reports no worsening pain today, leg pain is stable at her baseline.  Denies any fevers, constipation, or diarrhea. Reports good energy recently. Denies any shortness of breath, feels her breathing has been okay.  She denies any n/v/d. Denies any new cough. Energy is stable. History difficult to obtain as patient presents alone without an aid or caregiver and she became quite upset when told she needed to stay without an aid     Past Medical, Surgical and Family History were reviewed and pertinent updates were made in the Electronic Medical Record    Review of Systems:  All other systems reviewed were negative.     ECOG Performance Status: 2    Past Medical History:  Past Medical History:   Diagnosis Date    GERD (gastroesophageal reflux disease)     Gout     HTN (hypertension)     Morbid obesity (CMS-HCC)     Osteoporosis     Schizoaffective disorder, bipolar type    (CMS-HCC)     Type II diabetes mellitus    (CMS-HCC)        Medications:  Current Outpatient Medications   Medication Sig Dispense Refill    acetaminophen  (TYLENOL ) 325 MG tablet Take 1 tablet (325 mg total) by mouth every six (6) hours as needed for fever or pain.      allopurinoL  (ZYLOPRIM ) 100 MG tablet Take 1 tablet (100 mg total) by mouth daily.      amoxicillin -clavulanate (AUGMENTIN ) 875-125 mg per tablet apixaban  (ELIQUIS ) 5 mg Tab Take 1 tablet (5 mg total) by mouth two (2) times a day.      bosutinib (BOSULIF ) 400 mg Tab Take 1 tablet (400 mg) by mouth daily. Administer with food. Swallow tablet whole; do not cut, crush, break, or chew. 30 tablet 5    clozapine  (CLOZARIL ) 100 MG tablet Take 3 tablets (300 mg total) by mouth nightly.      diclofenac sodium (VOLTAREN) 1 % gel Apply 1 g topically every twelve (12) hours as needed for arthritis (apply to knees as needed).      famotidine  (PEPCID ) 20 MG tablet Take 1 tablet (20 mg total) by mouth two (2) times a day.      hydrALAZINE (APRESOLINE) 10 MG tablet Take 1 tablet (10 mg total) by mouth Three (3) times a day.      melatonin 5 mg cap Take 1 capsule by mouth nightly.      metFORMIN  (GLUCOPHAGE ) 500 MG tablet Take 1 tablet (500 mg total) by mouth in the morning and 1 tablet (500 mg total) in the evening. Take with meals.  .      metoPROLOL  succinate (TOPROL -XL) 25 MG 24 hr tablet Take 1 tablet (25 mg total) by mouth two (2) times a day.      multivitamin (TAB-A-VITE/THERAGRAN) per tablet Take 1 capsule by mouth daily.      ondansetron  (ZOFRAN ) 4 MG tablet Take 1 tablet (4 mg total) by mouth every eight (8) hours as needed for nausea.      pravastatin  (PRAVACHOL ) 20 MG tablet Take 1 tablet (20 mg total) by mouth at bedtime.      QUEtiapine  (SEROQUEL ) 50 MG tablet Take 1 tablet (50 mg total) by mouth nightly.  saliva stimulant comb. no.3 (BIOTENE MOISTURIZING MOUTH) Spry 1 spray every two (2) hours as needed.      sertraline  (ZOLOFT ) 50 MG tablet Take 1 tablet (50 mg total) by mouth daily.      valproic  acid (DEPAKENE ) 250 mg capsule Take 4 capsules (1,000 mg total) by mouth in the morning and 4 capsules (1,000 mg total) in the evening.       No current facility-administered medications for this visit.     Vital Signs:  BSA: There is no height or weight on file to calculate BSA.  There were no vitals filed for this visit.          Physical Exam:  Constitutional: Obese woman sitting in wheelchair, no acute distress  Eyes: PERRL. No scleral icterus or conjunctival injection.  Cardiovascular:  Regular rate, regular rhythm  S1, S2.  No murmurs, gallops or rubs.   Respiratory:  Breathing is unlabored.  CTAB. No rales, ronchi or crackles.  Breath sounds mildly diminished throughout all lung fields.   GI:  No distention or pain on palpation.  Bowel sounds are present.  .  Musculoskeletal:   strength equal bilaterally. No swelling noted in the LE.   Skin:  No rashes, petechiae or purpura.  grossly intact.   Neurologic:  alert and oriented to person/place, forced speech  Psychiatric: tangential requiring frequent re-direction    Relevant Laboratory, radiology and pathology results:  Labs not drawn today, will check CBC. CMP, and BCR-ABL in 1-2 weeks

## 2023-12-18 ENCOUNTER — Ambulatory Visit: Admit: 2023-12-18 | Payer: Medicare (Managed Care)

## 2023-12-18 ENCOUNTER — Ambulatory Visit
Admit: 2023-12-18 | Payer: Medicare (Managed Care) | Attending: Student in an Organized Health Care Education/Training Program | Primary: Student in an Organized Health Care Education/Training Program

## 2023-12-25 DIAGNOSIS — C9210 Chronic myeloid leukemia, BCR/ABL-positive, not having achieved remission: Principal | ICD-10-CM

## 2023-12-25 NOTE — Telephone Encounter (Signed)
 I spoke with a medical scheduler at [facility] regarding patient Kimberly Mathis to confirm appointments on the following date(s): 01/07/2024 (Lab and Return Visit with [name])    Ronnald Cerutti

## 2024-01-04 NOTE — Progress Notes (Signed)
 [facility] Follow-up    Patient Name: Kimberly Mathis  Patient Age: 59 y.o.  Encounter Date: 01/07/2024    Primary Care Provider:  Arloa Richerd Helling, PA    Referring Physician:  Freddrick, None Per Patient  [address]  [place],  [place] [address]    Reason for visit:   F/u visit for [facility]    Assessment:  Ms. Kimberly Mathis is a 59 y.o. 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 She began treatment on imatinib  in 12/2015 with optimal response and meeting appropriate treatment milestones, except her one year PCR, which was slightly above goal.  She has been in a MMR and MR4.0 since 05/2018 until Jan 2021 when she lost MMR d/t caregiver change and missing medication. She was able to achieve MMR again until Oct 2022. She has had stable transcripts since Dec 2022, until her most recent level in June 2023 when it was discovered that she was not receiving her medication daily at her home. Her level did improve to a more normal state, however despite her facility giving her the medication daily, per the MAR, her BCR-ABL transcripts stayed elevated at  0.551 %. A BCR-ABL mutational panel was drawn which did not reveal the presence of any new mutations. Therefore the decision was made to switch her to Bosutinib 400 mg. She presents today approximately 1.5 year after starting the medication for repeat labs and an APP visit.     Ms. Seubert presents to clinic today alone, without a caregiver or aid.  She continues to have some chronic knee pain but otherwise feels well with no complaints. Otherwise denies any new shortness of breath or increased side effects from her Bosutinib. Ms. Herford brought with her just a medication list without a full MAR but Bosutinib listed as a regular medication. On lab review WBC 13.2, Hgb 10.8, Plt 234, and ANC 7.3. Cmp is WN. Ferritin is drawn today due to presence of mild anemia and is low at 33.6. I would recommend she start a daily iron supplement, will recommend she start receiving this after her facility. Will recheck CBC in 4 months with Ferritin.     BCR-ABL drawn today to assess CML. She has previously been in CMR with negative Bcr-ABL transcripts.  She should continue Bosutinib 400 mg daily. RTC in 4 months.     Our team will contact her facility that she needs a CNA or Aid to present with her at all times for safety reasons. Although I continue to request this, she has still presented to every appointment alone over the past few months. Will make the patients guardian,  Kimberly Mathis aware of this, as it continues to be a safety concern.  I will strongly advise and insist that patient come to all of her appointments in the future with a caregiver or aid from her home as well as a full MAR from the past 3 months.      Plan and Recommendations:  - Continue Bosutinib 400 mg daily  - Discuss with facility to ensure that patient has an aid sent with her when coming to these appointments  - Will reach out to patient's guardian with updates and to inform her that the patient continues to come without an aid  - Recommended full and updated MAR from the past 3 months when patient comes to appointments   - Start daily iron supplementation due  to low Ferritin and mild anemia  - Continue Eliquis  5 mg twice daily for hx of PE indefinitely due to risk factors   - RTC in 4 months pending stable lab work       Dr. Carolan was available    Kimberly Cart PA-C  Physician Assistant   Hematology/Oncology Division  [facility]  01/07/2024    I personally spent 40 minutes face-to-face and non-face-to-face in the care of this patient, which includes all pre, intra, and post visit time on the date of service.    History of Present Illness:  Hematology/Oncology History Overview Note   Diagnosis: CML    SOKAL Score:    Presentation: asymptomatic; leukocytosis found on routine CBC    Presenting WBC Count: 17,400    Bone Marrow Biopsy: not performed as patient unable to tolerate procedure.    Cytogenetics:  Abnormal FISH: An interphase FISH assay shows an abnormal signal pattern consistent with BCR/ABL1 fusion in 44% of the 100 cells scored. Of note, these cells contain an atypical abnormal signal pattern consistent with BCR/ABL1 rearrangement and loss of the ABL1/ASS1 region.    Treatment:  Imatinib  400 mg daily- 12/31/2015    12/31/15:  BCR-ABL1 p210 transcripts were detected at a level of 9.511 IS% ratio in blood.  BCR-ABL1 p190 transcripts were detected at a level of 3 in 100,000 cells in blood.    03/23/16:  BCR-ABL1 p210 transcripts were detected at a level of 0.702 IS % ratio in blood.    Normal FISH:  A BCR/ABL1 interphase FISH assay for the previously documented 9;22 translocation shows a normal signal pattern in 100% of the 200 nuclei scored     06/15/16:  BCR-ABL1 p210 transcripts were detected at a level of 0.272 IS % ratio in blood.    09/2016:  0.298 IS %    12/18: 0.241 IS%    03/22/17: 0.072%  06/21/17: 0.052%  09/27/17: 0.125%  01/03/18: 0.040%  05/28/18: 0.008%  09/13/18: 0.003%  01/14/19: 0.181%  02/05/19: 0.149%  04/10/19: 0.037%  09/03/19: 0.040%  12/04/19: 0.004%  03/04/20: 0.032%  06/30/20: 0.018%  10/07/20: 0.307%  12/23/20: 0.351%  06/02/21: 3.311%  09/07/2021:  0.887%  12/07/2021: 2.530%  03/14/2022: 0. 496%  06/13/2022: 0.551%    07/28/2022: Started Bosutinib 400 mg (was being given Imatinib  400 mg as well per error from her facility)    08/14/2022: STOPPED Imatinib  400 mg (given instructions to continue Bosutinib 400 mg )    09/21/2022: 0.023%    12/18/2022: 0.008%    05/22/2023: BCR-ABL: negative    10/03/2023: BCR-ABL: Negative    01/07/2023: BCR-ABL pending     CML (chronic myelocytic leukemia) (CMS-HCC)   12/28/2015 Initial Diagnosis    CML (chronic myelocytic leukemia) (RAF-HCC)         Interval History:  Ms. Beedle presents to clinic today alone without an aid. She reports no worsening pain today, leg pain is stable at her baseline.  Denies any fevers, constipation, or diarrhea. Reports good energy recently. Denies any shortness of breath, feels her breathing has been okay.  She denies any n/v/d. Denies any new cough. Energy is stable. History difficult to obtain as patient presents alone without an aid or caregiver and she became quite upset when told she needed to stay without an aid     Past Medical, Surgical and Family History were reviewed and pertinent updates were made in the Electronic Medical Record    Review of Systems:  All other systems reviewed  were negative.     ECOG Performance Status: 2    Past Medical History:  Past Medical History:   Diagnosis Date    GERD (gastroesophageal reflux disease)     Gout     HTN (hypertension)     Morbid obesity (CMS-HCC)     Osteoporosis     Schizoaffective disorder, bipolar type    (CMS-HCC)     Type II diabetes mellitus    (CMS-HCC)        Medications:  Current Outpatient Medications   Medication Sig Dispense Refill    acetaminophen  (TYLENOL ) 325 MG tablet Take 1 tablet (325 mg total) by mouth every six (6) hours as needed for fever or pain.      allopurinoL  (ZYLOPRIM ) 100 MG tablet Take 1 tablet (100 mg total) by mouth daily.      amoxicillin -clavulanate (AUGMENTIN ) 875-125 mg per tablet       apixaban  (ELIQUIS ) 5 mg Tab Take 1 tablet (5 mg total) by mouth two (2) times a day.      bosutinib (BOSULIF ) 400 mg Tab Take 1 tablet (400 mg) by mouth daily. Administer with food. Swallow tablet whole; do not cut, crush, break, or chew. 30 tablet 5    clozapine  (CLOZARIL ) 100 MG tablet Take 3 tablets (300 mg total) by mouth nightly.      diclofenac sodium (VOLTAREN) 1 % gel Apply 1 g topically every twelve (12) hours as needed for arthritis (apply to knees as needed).      famotidine  (PEPCID ) 20 MG tablet Take 1 tablet (20 mg total) by mouth two (2) times a day.      hydrALAZINE (APRESOLINE) 10 MG tablet Take 1 tablet (10 mg total) by mouth Three (3) times a day. melatonin 5 mg cap Take 1 capsule by mouth nightly.      metFORMIN  (GLUCOPHAGE ) 500 MG tablet Take 1 tablet (500 mg total) by mouth in the morning and 1 tablet (500 mg total) in the evening. Take with meals.  .      metoPROLOL  succinate (TOPROL -XL) 25 MG 24 hr tablet Take 1 tablet (25 mg total) by mouth two (2) times a day.      multivitamin (TAB-A-VITE/THERAGRAN) per tablet Take 1 capsule by mouth daily.      ondansetron  (ZOFRAN ) 4 MG tablet Take 1 tablet (4 mg total) by mouth every eight (8) hours as needed for nausea.      pravastatin  (PRAVACHOL ) 20 MG tablet Take 1 tablet (20 mg total) by mouth at bedtime.      QUEtiapine  (SEROQUEL ) 50 MG tablet Take 1 tablet (50 mg total) by mouth nightly.      saliva stimulant comb. no.3 (BIOTENE MOISTURIZING MOUTH) Spry 1 spray every two (2) hours as needed.      sertraline  (ZOLOFT ) 50 MG tablet Take 1 tablet (50 mg total) by mouth daily.      valproic  acid (DEPAKENE ) 250 mg capsule Take 4 capsules (1,000 mg total) by mouth in the morning and 4 capsules (1,000 mg total) in the evening.       No current facility-administered medications for this visit.     Vital Signs:  BSA: There is no height or weight on file to calculate BSA.  Vitals:    01/07/24 1556   BP: 119/73   Pulse: 99   Resp: 18   Temp: 36.8 ??C (98.2 ??F)   SpO2: 97%             Physical Exam:  Constitutional: Obese woman sitting in wheelchair, no acute distress  Eyes: PERRL. No scleral icterus or conjunctival injection.  Cardiovascular:  Regular rate, regular rhythm  S1, S2.  No murmurs, gallops or rubs.   Respiratory:  Breathing is unlabored.  CTAB. No rales, ronchi or crackles.  Breath sounds mildly diminished throughout all lung fields.   GI:  No distention or pain on palpation.  Bowel sounds are present.  .  Musculoskeletal:   strength equal bilaterally. No swelling noted in the LE.   Skin:  No rashes, petechiae or purpura.  grossly intact.   Neurologic:  alert and oriented to person/place, forced speech  Psychiatric: tangential requiring frequent re-direction    Relevant Laboratory, radiology and pathology results:      Lab on 01/07/2024   Component Date Value Ref Range Status    Sodium 01/07/2024 140  135 - 145 mmol/L Final    Potassium 01/07/2024 3.9  3.4 - 4.8 mmol/L Final    Chloride 01/07/2024 101  98 - 107 mmol/L Final    CO2 01/07/2024 26.0  20.0 - 31.0 mmol/L Final    Anion Gap 01/07/2024 13  5 - 14 mmol/L Final    BUN 01/07/2024 7 (L)  9 - 23 mg/dL Final    Creatinine 98/94/7973 0.54 (L)  0.55 - 1.02 mg/dL Final    BUN/Creatinine Ratio 01/07/2024 13   Final    eGFR CKD-EPI (2021) Female 01/07/2024 >90  >=60 mL/min/1.89m2 Final    eGFR calculated with CKD-EPI 2021 equation in accordance with [employer] and [employer] of Nephrology Task Force recommendations.    Glucose 01/07/2024 102  70 - 179 mg/dL Final    Calcium 98/94/7973 9.0  8.7 - 10.4 mg/dL Final    Albumin 98/94/7973 3.8  3.4 - 5.0 g/dL Final    Total Protein 01/07/2024 8.1  5.7 - 8.2 g/dL Final    Total Bilirubin 01/07/2024 0.2 (L)  0.3 - 1.2 mg/dL Final    AST 98/94/7973 16  <=34 U/L Final    ALT 01/07/2024 13  10 - 49 U/L Final    Alkaline Phosphatase 01/07/2024 64  46 - 116 U/L Final    WBC 01/07/2024 13.2 (H)  3.6 - 11.2 10*9/L Final    RBC 01/07/2024 3.89 (L)  3.95 - 5.13 10*12/L Final    HGB 01/07/2024 10.8 (L)  11.3 - 14.9 g/dL Final    HCT 98/94/7973 32.8 (L)  34.0 - 44.0 % Final    MCV 01/07/2024 84.3  77.6 - 95.7 fL Final    MCH 01/07/2024 27.7  25.9 - 32.4 pg Final    MCHC 01/07/2024 32.9  32.0 - 36.0 g/dL Final    RDW 98/94/7973 15.7 (H)  12.2 - 15.2 % Final    MPV 01/07/2024 9.5  6.8 - 10.7 fL Final    Platelet 01/07/2024 234  150 - 450 10*9/L Final    Neutrophils % 01/07/2024 55.4  % Final    Lymphocytes % 01/07/2024 35.9  % Final    Monocytes % 01/07/2024 6.9  % Final    Eosinophils % 01/07/2024 1.1  % Final    Basophils % 01/07/2024 0.7  % Final    Absolute Neutrophils 01/07/2024 7.3  1.8 - 7.8 10*9/L Final    Absolute Lymphocytes 01/07/2024 4.7 (H)  1.1 - 3.6 10*9/L Final    Absolute Monocytes 01/07/2024 0.9 (H)  0.3 - 0.8 10*9/L Final    Absolute Eosinophils 01/07/2024 0.2  0.0 - 0.5 10*9/L  Final    Absolute Basophils 01/07/2024 0.1  0.0 - 0.1 10*9/L Final    Ferritin 01/07/2024 33.6  30.0 - 270.7 ng/mL Final

## 2024-01-07 ENCOUNTER — Other Ambulatory Visit: Admit: 2024-01-07 | Discharge: 2024-01-08 | Payer: Medicare (Managed Care)

## 2024-01-07 ENCOUNTER — Ambulatory Visit
Admit: 2024-01-07 | Discharge: 2024-01-08 | Payer: Medicare (Managed Care) | Attending: Student in an Organized Health Care Education/Training Program | Primary: Student in an Organized Health Care Education/Training Program

## 2024-01-07 LAB — CBC W/ AUTO DIFF
BASOPHILS ABSOLUTE COUNT: 0.1 10*9/L (ref 0.0–0.1)
BASOPHILS RELATIVE PERCENT: 0.7 %
EOSINOPHILS ABSOLUTE COUNT: 0.2 10*9/L (ref 0.0–0.5)
EOSINOPHILS RELATIVE PERCENT: 1.1 %
HEMATOCRIT: 32.8 % — ABNORMAL LOW (ref 34.0–44.0)
HEMOGLOBIN: 10.8 g/dL — ABNORMAL LOW (ref 11.3–14.9)
LYMPHOCYTES ABSOLUTE COUNT: 4.7 10*9/L — ABNORMAL HIGH (ref 1.1–3.6)
LYMPHOCYTES RELATIVE PERCENT: 35.9 %
MEAN CORPUSCULAR HEMOGLOBIN CONC: 32.9 g/dL (ref 32.0–36.0)
MEAN CORPUSCULAR HEMOGLOBIN: 27.7 pg (ref 25.9–32.4)
MEAN CORPUSCULAR VOLUME: 84.3 fL (ref 77.6–95.7)
MEAN PLATELET VOLUME: 9.5 fL (ref 6.8–10.7)
MONOCYTES ABSOLUTE COUNT: 0.9 10*9/L — ABNORMAL HIGH (ref 0.3–0.8)
MONOCYTES RELATIVE PERCENT: 6.9 %
NEUTROPHILS ABSOLUTE COUNT: 7.3 10*9/L (ref 1.8–7.8)
NEUTROPHILS RELATIVE PERCENT: 55.4 %
PLATELET COUNT: 234 10*9/L (ref 150–450)
RED BLOOD CELL COUNT: 3.89 10*12/L — ABNORMAL LOW (ref 3.95–5.13)
RED CELL DISTRIBUTION WIDTH: 15.7 % — ABNORMAL HIGH (ref 12.2–15.2)
WBC ADJUSTED: 13.2 10*9/L — ABNORMAL HIGH (ref 3.6–11.2)

## 2024-01-07 LAB — COMPREHENSIVE METABOLIC PANEL
ALBUMIN: 3.8 g/dL (ref 3.4–5.0)
ALKALINE PHOSPHATASE: 64 U/L (ref 46–116)
ALT (SGPT): 13 U/L (ref 10–49)
ANION GAP: 13 mmol/L (ref 5–14)
AST (SGOT): 16 U/L (ref <=34)
BILIRUBIN TOTAL: 0.2 mg/dL — ABNORMAL LOW (ref 0.3–1.2)
BLOOD UREA NITROGEN: 7 mg/dL — ABNORMAL LOW (ref 9–23)
BUN / CREAT RATIO: 13
CALCIUM: 9 mg/dL (ref 8.7–10.4)
CHLORIDE: 101 mmol/L (ref 98–107)
CO2: 26 mmol/L (ref 20.0–31.0)
CREATININE: 0.54 mg/dL — ABNORMAL LOW (ref 0.55–1.02)
EGFR CKD-EPI (2021) FEMALE: >90 mL/min/1.73m2 (ref >=60)
GLUCOSE RANDOM: 102 mg/dL (ref 70–179)
POTASSIUM: 3.9 mmol/L (ref 3.4–4.8)
PROTEIN TOTAL: 8.1 g/dL (ref 5.7–8.2)
SODIUM: 140 mmol/L (ref 135–145)

## 2024-01-07 LAB — FERRITIN: FERRITIN: 33.6 ng/mL (ref 30.0–270.7)

## 2024-01-07 NOTE — Patient Instructions (Signed)
 Continue Bosutinib 400 mg daily, labs drawn today and are pending    Recommend follow up in 3 months with labs to monitor CML levels    Recommend patient come with an aid at all times for safety    If you have questions or concerns, you may call the Nurse call line at [phone].    No visits with results within 1 Day(s) from this visit.   Latest known visit with results is:   Lab on 10/03/2023   Component Date Value Ref Range Status    Sodium 10/03/2023 144  135 - 145 mmol/L Final    Potassium 10/03/2023 3.9  3.4 - 4.8 mmol/L Final    Chloride 10/03/2023 99  98 - 107 mmol/L Final    CO2 10/03/2023 32.0 (H)  20.0 - 31.0 mmol/L Final    Anion Gap 10/03/2023 13  5 - 14 mmol/L Final    BUN 10/03/2023 7 (L)  9 - 23 mg/dL Final    Creatinine 89/98/7974 0.61  0.55 - 1.02 mg/dL Final    BUN/Creatinine Ratio 10/03/2023 11   Final    eGFR CKD-EPI (2021) Female 10/03/2023 >90  >=60 mL/min/1.65m2 Final    eGFR calculated with CKD-EPI 2021 equation in accordance with [employer] and [employer]ph[employer] Task Force recommendations.    Glucose 10/03/2023 144  70 - 179 mg/dL Final    Calcium 89/98/7974 10.3  8.7 - 10.4 mg/dL Final    Albumin 89/98/7974 4.3  3.4 - 5.0 g/dL Final    Total Protein 10/03/2023 9.0 (H)  5.7 - 8.2 g/dL Final    Total Bilirubin 10/03/2023 0.2 (L)  0.3 - 1.2 mg/dL Final    AST 89/98/7974 18  <=34 U/L Final    ALT 10/03/2023 19  10 - 49 U/L Final    Alkaline Phosphatase 10/03/2023 76  46 - 116 U/L Final    Specimen Type 10/03/2023    Final                    Value:Blood    BCR::ABL1 p210 Assay 10/03/2023 Negative   Final    BCR::ABL1 p210 IS% Ratio 10/03/2023 <0.001  <0.001 IS% Ratio Final    BCR/ABL1 p210 Assay Results 10/03/2023    Final                    Value:RESULTS:  No BCR::ABL1 p210 transcripts were detected.    INTERPRETATION:  Lack of detectable BCR::ABL1 p210 RNA suggests molecular remission of leukemia.    REFERENCES:  Melo JV. Baillieres Clin [place]. 1997 Jun;10(2):203-22. PMID: [id number]  Phebe PARAS, et al. Clin Cancer Res. 2005 May 1;11(9):3425-32. PMID: 84132755  NCCN Clinical Practice Guidelines [email or web address]   Blima SAUNDERS, et al. Mod Pathol. 2004 Jan;17(1):96-103. PMID: [id number]  Minnie PARAS, et al. Leukemia. 2003 Dec;17(12):2318-57. PMID: [id number]    METHOD:  Reverse transcription polymerase chain reaction was performed on QuantStudio Dx instrument using primers and TaqMan probe targeting BCR::ABL1 p210 and ABL1 control gene cDNA. Assay sensitivity is estimated at one cell per 100,000 cells which is equivalent to 0.001 IS % ratio on the international scale. The reference range for this test is: No BCR::ABL1 p210 RNA detected.    This test was developed and its performance characteristics determined by the [facility]. It has not  been cleared by the [place]  Food and Drug Administration. However, such approval is not required for clinical implementation, and test results have been shown to be clinically useful. This laboratory is CAP accredited and CLIA certified to perform high complexity testing.          WBC 10/03/2023 10.3  3.6 - 11.2 10*9/L Final    RBC 10/03/2023 3.98  3.95 - 5.13 10*12/L Final    HGB 10/03/2023 11.2 (L)  11.3 - 14.9 g/dL Final    HCT 89/98/7974 34.3  34.0 - 44.0 % Final    MCV 10/03/2023 86.1  77.6 - 95.7 fL Final    MCH 10/03/2023 28.1  25.9 - 32.4 pg Final    MCHC 10/03/2023 32.6  32.0 - 36.0 g/dL Final    RDW 89/98/7974 16.0 (H)  12.2 - 15.2 % Final    MPV 10/03/2023 9.0  6.8 - 10.7 fL Final    Platelet 10/03/2023 271  150 - 450 10*9/L Final    Neutrophils % 10/03/2023 58.4  % Final    Lymphocytes % 10/03/2023 31.3  % Final    Monocytes % 10/03/2023 8.7  % Final    Eosinophils % 10/03/2023 1.0  % Final    Basophils % 10/03/2023 0.6  % Final    Absolute Neutrophils 10/03/2023 6.0  1.8 - 7.8 10*9/L Final    Absolute Lymphocytes 10/03/2023 3.2  1.1 - 3.6 10*9/L Final    Absolute Monocytes 10/03/2023 0.9 (H)  0.3 - 0.8 10*9/L Final    Absolute Eosinophils 10/03/2023 0.1  0.0 - 0.5 10*9/L Final    Absolute Basophils 10/03/2023 0.1  0.0 - 0.1 10*9/L Final

## 2024-01-07 NOTE — Progress Notes (Signed)
 [facility] Delivery Pharm[facility] Refill Coordination Note    Specialty Medication(s) to be Shipped:   Hematology/Oncology: Bosulif     Other medication(s) to be shipped: No additional medications requested for fill at this time    Specialty Medications not needed at this time: N/A     Kimberly Mathis, DOB: 1965/02/25  Phone: [phone] (home)       All above HIPAA information was verified with patient's caregiver, Nurse     Was a translator used for this call? No    Completed refill call assessment today to schedule patient's medication shipment from the [facility] Delivery Pharm[facility]  [phone]).  All relevant notes have been reviewed.     Specialty medication(s) and dose(s) confirmed: Regimen is correct and unchanged.   Changes to medications: Kimberly Mathis reports no changes at this time.  Changes to insurance: No  New side effects reported not previously addressed with a pharmacist or physician: None reported  Questions for the pharmacist: No    Confirmed patient received a Conservation Officer, Historic Buildings and a Surveyor, Mining with first shipment. The patient will receive a drug information handout for each medication shipped and additional FDA Medication Guides as required.       DISEASE/MEDICATION-SPECIFIC INFORMATION        N/A    SPECIALTY MEDICATION ADHERENCE     Medication Adherence    Patient reported X missed doses in the last month: 0  Specialty Medication: BOSULIF  400 mg Tab (bosutinib)  Patient is on additional specialty medications: No  Support network for adherence: home health agency              Were doses missed due to medication being on hold? No    BOSULIF  400 mg Tab (bosutinib) : 0 doses of medicine on hand   Specialty medication is an injection or given on a cycle: No    REFERRAL TO PHARMACIST     Referral to the pharmacist: Not needed      [facility]     Shipping address confirmed in Epic.     Cost and Payment: Patient has a $0 copay, payment information is not required.    Delivery Scheduled: Yes, Expected medication delivery date: 01/08/24.     Medication will be delivered via Next Day Courier to the prescription address in Epic WAM.    Naysha Sholl   [facility]  Specialty Technician

## 2024-01-07 NOTE — Progress Notes (Signed)
 Opened in error

## 2024-01-08 MED FILL — BOSULIF 400 MG TABLET: ORAL | 30 days supply | Qty: 30 | Fill #3

## 2024-01-09 DIAGNOSIS — C9210 Chronic myeloid leukemia, BCR/ABL-positive, not having achieved remission: Principal | ICD-10-CM

## 2024-01-09 NOTE — Telephone Encounter (Signed)
 I spoke with Cordella in medical records on behalf of patient Kimberly Mathis to confirm appointments on the following date(s): 05/05/24 Labs and Return Visit with [name].     Faith JINNY Novak

## 2024-01-29 ENCOUNTER — Emergency Department
Admit: 2024-01-29 | Discharge: 2024-01-29 | Disposition: A | Discharge status: Other Acute Inpatient Hospital | Payer: Medicare (Managed Care) | Attending: Emergency Medicine

## 2024-01-29 ENCOUNTER — Emergency Department
Admit: 2024-01-29 | Discharge: 2024-01-29 | Disposition: A | Discharge status: Other Acute Inpatient Hospital | Attending: Emergency Medicine

## 2024-01-29 DIAGNOSIS — U071 COVID-19: Principal | ICD-10-CM

## 2024-01-29 NOTE — Progress Notes (Signed)
 [facility] Delivery Pharmacy Refill Coordination Note    Specialty Medication(s) to be Shipped:   Hematology/Oncology: Bosulif     Other medication(s) to be shipped: No additional medications requested for fill at this time    Specialty Medications not needed at this time: N/A     Kimberly Mathis, DOB: 07-Feb-1965  Phone: [phone] (home)       All above HIPAA information was verified with patient's caregiver, Kimberly Mathis     Was a translator used for this call? No    Completed refill call assessment today to schedule patient's medication shipment from the [facility] and Home Delivery Pharmacy  [phone]).  All relevant notes have been reviewed.     Specialty medication(s) and dose(s) confirmed: Regimen is correct and unchanged.   Changes to medications: Kaylene reports no changes at this time.  Changes to insurance: No  New side effects reported not previously addressed with a pharmacist or physician: None reported  Questions for the pharmacist: No    Confirmed patient received a Conservation Officer, Historic Buildings and a Surveyor, Mining with first shipment. The patient will receive a drug information handout for each medication shipped and additional FDA Medication Guides as required.       DISEASE/MEDICATION-SPECIFIC INFORMATION        N/A    SPECIALTY MEDICATION ADHERENCE     Medication Adherence    Patient reported X missed doses in the last month: 0  Specialty Medication: Bosulif  400mg   Patient is on additional specialty medications: No  Informant: patient  Support network for adherence: home health agency              Were doses missed due to medication being on hold? No     bosutinib: BOSULIF  400 mg Tab: 5 days of medicine on hand       Specialty medication is an injection or given on a cycle: No    REFERRAL TO PHARMACIST     Referral to the pharmacist: Not needed      [facility]     Shipping address confirmed in Epic.     Cost and Payment: Patient has a $0 copay, payment information is not required.    Delivery Scheduled: Yes, Expected medication delivery date: 02/01/2024.     Medication will be delivered via Next Day Courier to the prescription address in Epic WAM.    Kimberly Mathis   [facility]  Specialty Technician

## 2024-01-29 NOTE — ED Provider Notes (Cosign Needed)
 Emergency Department Provider Note        ED Clinical Impression     Final diagnoses:   COVID-19 (Primary)       ED Assessment/Plan       History     Chief Complaint   Patient presents with    Respiratory Problem     HPI    This is an overall well-appearing 59 year old female who presents to the emergency department for evaluation of cough and congestion.  Patient states that she was recently diagnosed with COVID-19.  She states that she has not been having any shortness of breath or chest pain.  She denies vomiting.  She is a poor historian.  Patient is well-appearing and is asking for food.  She comes to the emergency department from a nursing facility unaccompanied by any type of CNA or aide.  Upon chart review, she does have a past medical history significant for schizoaffective behavior as well as type 2 diabetes.  She does not appear to be responding to any type of internal stimuli.  Upon chart review, I do not appreciate any relevant surgical history.    Past Medical History[1]    Past Surgical History[2]    Family History[3]    Social History[4]    Review of Systems   Constitutional:  Negative for chills and fever.   HENT:  Positive for congestion, postnasal drip and rhinorrhea. Negative for drooling and trouble swallowing.    Eyes:  Negative for photophobia.   Respiratory:  Positive for cough. Negative for shortness of breath.    Cardiovascular:  Negative for chest pain.   Gastrointestinal:  Negative for abdominal pain.   Genitourinary:  Negative for dysuria.   Musculoskeletal:  Negative for back pain.   Skin:  Negative for rash.   Neurological:  Negative for headaches.   Psychiatric/Behavioral:  Negative for behavioral problems.        Physical Exam     BP 122/85  - Pulse 98  - Temp 37 ??C (98.6 ??F) (Oral)  - Resp 18  - Ht 170.2 cm (5' 7)  - Wt (!) 129.3 kg (285 lb)  - LMP 03/11/2018  - SpO2 100%  - BMI 44.64 kg/m??     Physical Exam  Vitals reviewed.   Constitutional:       General: She is not in acute distress.     Appearance: Normal appearance. She is well-developed. She is obese. She is not ill-appearing or toxic-appearing.   HENT:      Head: Normocephalic.      Right Ear: External ear normal.      Left Ear: External ear normal.      Nose: Congestion and rhinorrhea present.      Comments: Clear rhinorrhea noted     Mouth/Throat:      Mouth: Mucous membranes are moist. No oral lesions.      Pharynx: Uvula midline. No pharyngeal swelling, oropharyngeal exudate, posterior oropharyngeal erythema or uvula swelling.      Tonsils: No tonsillar exudate or tonsillar abscesses.      Comments: Multiple dental caries noted but no erythema or other abnormality noted in the posterior oropharynx.  No exudate.  Uvula is midline.  No trismus.  Eyes:      Conjunctiva/sclera: Conjunctivae normal.   Cardiovascular:      Rate and Rhythm: Normal rate and regular rhythm.      Pulses: Normal pulses.      Heart sounds: Normal heart sounds.   Pulmonary:  Effort: Pulmonary effort is normal. No respiratory distress.      Breath sounds: Normal breath sounds. No stridor. No wheezing or rhonchi.   Abdominal:      General: Bowel sounds are normal. There is no distension.      Palpations: Abdomen is soft. There is no mass.      Tenderness: There is no abdominal tenderness. There is no guarding or rebound.      Hernia: No hernia is present.   Musculoskeletal:         General: Normal range of motion.      Cervical back: Normal range of motion and neck supple. No rigidity.   Skin:     General: Skin is warm and dry.      Capillary Refill: Capillary refill takes less than 2 seconds.   Neurological:      Mental Status: She is alert and oriented to person, place, and time.   Psychiatric:         Mood and Affect: Mood normal.         Behavior: Behavior normal.         ED Course     ED Course as of 01/29/24 1659   Tue Jan 29, 2024   1508 Rapid Group A Strep PCR: Not Detected   1526 SARS-CoV-2 PCR(!): Positive     XR Chest 2 views   Final Result 1. Shallow inspiration with bibasilar atelectasis.                           Signed (Electronic Signature): 01/29/2024 2:50 PM    Signed By: John Matzko, MD        Medications - No data to display      Medical Decision Making  Amount and/or Complexity of Data Reviewed  Labs: ordered. Decision-making details documented in ED Course.  Radiology: ordered.       This is an overall well-appearing 59 year old female who presents to the emergency department for evaluation of continued rhinorrhea and cough after being diagnosed with COVID-19 1 week ago.  Differential diagnosis to include but not limited to: Viral bronchitis, continued sinusitis/rhinitis secondary to viral illness.  Low degree of suspicion for secondary AOM, low degree of suspicion for pneumonia.  Lower degree of suspicion for strep pharyngitis or other posterior deep space infection of the neck.  Low degree of suspicion for urinary tract infection, colitis, diverticulitis or appendicitis.  Low degree of suspicion for active psychosis.  Patient's emergency department evaluation overall reassuring.  She is tolerating p.o. consumption.  Chest x-ray shows mild atelectasis, will send patient home with incentive spirometer.  Patient agreeable.  Questions answered.  At this point in time, the patient did not warrant further evaluation in the ER as emergent etiology of their symptoms is unlikely based on my physical exam and work up. I discussed my findings and thought process with the patient. I answered all of their questions and addressed any concerns that they still may have. I discussed with them strict return precautions and the plan for follow up to which they verbalized understanding. Patient remained hemodynamically stable while under my care in the ER and will remain in the emergency department setting until one of the caregivers from her facility can pick her up.  This note was created utilizing dictation software.  Please recognize at times where substitutions and or errors in transcription, phonation, and/or syntax may have occurred.  If there are any questions  or need for clarity, please reach out to the author.       Discussion with other professionals: ED attending, Dr. Azrak  Independent interpretation: X-ray(s) - see ED course  I have reviewed recent and relavant previous record, including: Outpatient notes - patient well followed by [facility] for [facility]  Escalation of Care including OBS/Admission/Transfer was considered: However, patient was determined to be appropriate for outpatient management. See progress note for additional detail.  Social Determinants that significantly affected care: Difficulty affording meds, Lacks housing stability, and Lacks transportation  Prescription drugs considered but not prescribed: Antibiotics - considered do not feel beneficial at this time  Diagnostic tests considered but not performed: I have considered emergent cross-sectional imaging but I do not feel the patient would benefit from it at this time             [1]   Past Medical History:  Diagnosis Date    GERD (gastroesophageal reflux disease)     Gout     HTN (hypertension)     Morbid obesity (CMS-HCC)     Osteoporosis     Schizoaffective disorder, bipolar type    (CMS-HCC)     Type II diabetes mellitus (CMS-HCC)    [2] No past surgical history on file.  [3] History reviewed. No pertinent family history.  [4]   Social History  Socioeconomic History    Marital status: Single     Spouse name: None    Number of children: None    Years of education: None    Highest education level: None   Tobacco Use    Smoking status: Former     Current packs/day: 1.00     Types: Cigarettes    Smokeless tobacco: Never   Vaping Use    Vaping status: Never Used   Substance and Sexual Activity    Alcohol use: Not Currently    Drug use: Not Currently     Social Drivers of Health     Tobacco Use: Medium Risk (01/29/2024)    Patient History     Smoking Tobacco Use: Former     Smokeless Tobacco Use: Never   Alcohol Use: Not At Risk (12/29/2022)    Received from [facility]    AUDIT-C     Q1: How often do you have a drink containing alcohol?: Never     Q2: How many drinks containing alcohol do you have on a typical day when you are drinking?: Patient does not drink     Q3: How often do you have six or more drinks on one occasion?: Never   Interpersonal Safety: Not At Risk (01/29/2024)    Interpersonal Safety     Unsafe Where You Currently Live: No     Physically Hurt by Anyone: No     Abused by Anyone: No        Ramonita Rollo Dragon, FNP  01/29/24 1707

## 2024-01-29 NOTE — ED Notes (Signed)
 Pt given discharge instructions and stated verbal understanding. Pt denies any distress at this time with normal respirations noted. Pt given discharge paperwork and ambulated out of facility without any incident.

## 2024-01-29 NOTE — ED Triage Note (Signed)
 Arrived by EMS from [facility];  states she was diagnosed Covid+ 5 days ago and still has cough and body aches; given Tylenol  1gram po en route by EMS

## 2024-01-31 MED FILL — BOSULIF 400 MG TABLET: ORAL | 30 days supply | Qty: 30 | Fill #4
